# Patient Record
Sex: Male | Born: 1935 | Race: White | Hispanic: No | Marital: Married | Smoking: Former smoker
Health system: Southern US, Community
[De-identification: ages and names within clinical notes are randomized; demographics above are authoritative.]

## PROBLEM LIST (undated history)

## (undated) DIAGNOSIS — R55 Syncope and collapse: Secondary | ICD-10-CM

## (undated) DIAGNOSIS — H9192 Unspecified hearing loss, left ear: Secondary | ICD-10-CM

## (undated) DIAGNOSIS — E119 Type 2 diabetes mellitus without complications: Secondary | ICD-10-CM

## (undated) DIAGNOSIS — I10 Essential (primary) hypertension: Secondary | ICD-10-CM

## (undated) DIAGNOSIS — I447 Left bundle-branch block, unspecified: Secondary | ICD-10-CM

## (undated) DIAGNOSIS — R2230 Localized swelling, mass and lump, unspecified upper limb: Secondary | ICD-10-CM

## (undated) DIAGNOSIS — F4310 Post-traumatic stress disorder, unspecified: Secondary | ICD-10-CM

## (undated) DIAGNOSIS — H9312 Tinnitus, left ear: Secondary | ICD-10-CM

## (undated) DIAGNOSIS — F419 Anxiety disorder, unspecified: Secondary | ICD-10-CM

## (undated) DIAGNOSIS — I255 Ischemic cardiomyopathy: Secondary | ICD-10-CM

## (undated) DIAGNOSIS — C3491 Malignant neoplasm of unspecified part of right bronchus or lung: Principal | ICD-10-CM

## (undated) DIAGNOSIS — L0292 Furuncle, unspecified: Secondary | ICD-10-CM

## (undated) DIAGNOSIS — F515 Nightmare disorder: Secondary | ICD-10-CM

## (undated) DIAGNOSIS — S92909A Unspecified fracture of unspecified foot, initial encounter for closed fracture: Secondary | ICD-10-CM

## (undated) DIAGNOSIS — I2510 Atherosclerotic heart disease of native coronary artery without angina pectoris: Secondary | ICD-10-CM

## (undated) DIAGNOSIS — I219 Acute myocardial infarction, unspecified: Secondary | ICD-10-CM

## (undated) DIAGNOSIS — M1990 Unspecified osteoarthritis, unspecified site: Secondary | ICD-10-CM

## (undated) DIAGNOSIS — R0602 Shortness of breath: Secondary | ICD-10-CM

## (undated) DIAGNOSIS — N189 Chronic kidney disease, unspecified: Secondary | ICD-10-CM

## (undated) DIAGNOSIS — J449 Chronic obstructive pulmonary disease, unspecified: Secondary | ICD-10-CM

## (undated) DIAGNOSIS — E785 Hyperlipidemia, unspecified: Secondary | ICD-10-CM

## (undated) HISTORY — DX: Atherosclerotic heart disease of native coronary artery without angina pectoris: I25.10

## (undated) HISTORY — DX: Nightmare disorder: F51.5

## (undated) HISTORY — DX: Chronic obstructive pulmonary disease, unspecified: J44.9

## (undated) HISTORY — DX: Ischemic cardiomyopathy: I25.5

## (undated) HISTORY — DX: Unspecified fracture of unspecified foot, initial encounter for closed fracture: S92.909A

## (undated) HISTORY — DX: Type 2 diabetes mellitus without complications: E11.9

## (undated) HISTORY — PX: FRACTURE SURGERY: SHX138

## (undated) HISTORY — DX: Malignant neoplasm of unspecified part of right bronchus or lung: C34.91

## (undated) HISTORY — DX: Essential (primary) hypertension: I10

## (undated) HISTORY — DX: Hyperlipidemia, unspecified: E78.5

---
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
## 1991-04-08 DIAGNOSIS — E785 Hyperlipidemia, unspecified: Secondary | ICD-10-CM

## 1991-04-08 DIAGNOSIS — J449 Chronic obstructive pulmonary disease, unspecified: Secondary | ICD-10-CM

## 1991-04-08 DIAGNOSIS — I10 Essential (primary) hypertension: Secondary | ICD-10-CM

## 1991-04-08 HISTORY — DX: Essential (primary) hypertension: I10

## 1991-04-08 HISTORY — DX: Hyperlipidemia, unspecified: E78.5

## 1991-04-08 HISTORY — DX: Chronic obstructive pulmonary disease, unspecified: J44.9

## 1998-04-07 DIAGNOSIS — E119 Type 2 diabetes mellitus without complications: Secondary | ICD-10-CM

## 1998-04-07 HISTORY — DX: Type 2 diabetes mellitus without complications: E11.9

## 2000-08-05 ENCOUNTER — Encounter: Payer: Self-pay | Admitting: Family Medicine

## 2000-11-05 ENCOUNTER — Encounter: Payer: Self-pay | Admitting: Family Medicine

## 2000-11-05 LAB — CONVERTED CEMR LAB: Hgb A1c MFr Bld: 7 %

## 2001-02-05 ENCOUNTER — Encounter: Payer: Self-pay | Admitting: Family Medicine

## 2001-06-05 ENCOUNTER — Encounter: Payer: Self-pay | Admitting: Family Medicine

## 2001-06-05 LAB — CONVERTED CEMR LAB: Hgb A1c MFr Bld: 8.3 %

## 2002-01-05 ENCOUNTER — Encounter: Payer: Self-pay | Admitting: Family Medicine

## 2002-01-05 LAB — CONVERTED CEMR LAB: Hgb A1c MFr Bld: 10.1 %

## 2002-04-07 ENCOUNTER — Encounter: Payer: Self-pay | Admitting: Family Medicine

## 2002-04-07 LAB — CONVERTED CEMR LAB: Hgb A1c MFr Bld: 7 %

## 2002-10-06 ENCOUNTER — Encounter: Payer: Self-pay | Admitting: Family Medicine

## 2002-10-06 LAB — CONVERTED CEMR LAB: Hgb A1c MFr Bld: 13.2 %

## 2003-01-06 ENCOUNTER — Encounter: Payer: Self-pay | Admitting: Family Medicine

## 2003-10-06 ENCOUNTER — Encounter: Payer: Self-pay | Admitting: Family Medicine

## 2004-06-05 ENCOUNTER — Encounter: Payer: Self-pay | Admitting: Family Medicine

## 2004-06-05 LAB — CONVERTED CEMR LAB: PSA: 0.76 ng/mL

## 2004-06-06 ENCOUNTER — Ambulatory Visit: Payer: Self-pay | Admitting: Family Medicine

## 2004-06-10 ENCOUNTER — Ambulatory Visit: Payer: Self-pay | Admitting: Family Medicine

## 2004-11-05 ENCOUNTER — Encounter: Payer: Self-pay | Admitting: Family Medicine

## 2004-11-11 ENCOUNTER — Ambulatory Visit: Payer: Self-pay

## 2004-12-05 ENCOUNTER — Ambulatory Visit: Payer: Self-pay | Admitting: Family Medicine

## 2004-12-13 ENCOUNTER — Ambulatory Visit: Payer: Self-pay | Admitting: Family Medicine

## 2005-03-07 ENCOUNTER — Ambulatory Visit: Payer: Self-pay | Admitting: Family Medicine

## 2005-03-07 LAB — CONVERTED CEMR LAB: Hgb A1c MFr Bld: 7.8 %

## 2005-03-14 ENCOUNTER — Ambulatory Visit: Payer: Self-pay | Admitting: Family Medicine

## 2005-09-05 ENCOUNTER — Encounter: Payer: Self-pay | Admitting: Family Medicine

## 2005-09-24 ENCOUNTER — Ambulatory Visit: Payer: Self-pay | Admitting: Family Medicine

## 2005-09-24 LAB — CONVERTED CEMR LAB
Microalbumin U total vol: 40.8 mg/L
PSA: 0.73 ng/mL

## 2005-09-30 ENCOUNTER — Ambulatory Visit: Payer: Self-pay | Admitting: Family Medicine

## 2006-01-05 ENCOUNTER — Encounter: Payer: Self-pay | Admitting: Family Medicine

## 2006-02-04 ENCOUNTER — Ambulatory Visit: Payer: Self-pay | Admitting: Family Medicine

## 2006-02-06 ENCOUNTER — Ambulatory Visit: Payer: Self-pay | Admitting: Family Medicine

## 2006-04-07 HISTORY — PX: CORONARY ARTERY BYPASS GRAFT: SHX141

## 2006-05-14 ENCOUNTER — Inpatient Hospital Stay (HOSPITAL_COMMUNITY): Admission: EM | Admit: 2006-05-14 | Discharge: 2006-05-24 | Payer: Self-pay | Admitting: Emergency Medicine

## 2006-05-14 ENCOUNTER — Encounter: Payer: Self-pay | Admitting: Cardiology

## 2006-05-14 ENCOUNTER — Ambulatory Visit: Payer: Self-pay | Admitting: Cardiology

## 2006-05-15 ENCOUNTER — Ambulatory Visit: Payer: Self-pay | Admitting: Thoracic Surgery (Cardiothoracic Vascular Surgery)

## 2006-05-15 ENCOUNTER — Encounter: Payer: Self-pay | Admitting: Vascular Surgery

## 2006-06-02 ENCOUNTER — Ambulatory Visit: Payer: Self-pay

## 2006-06-02 LAB — CONVERTED CEMR LAB
CO2: 26 meq/L (ref 19–32)
Calcium: 9 mg/dL (ref 8.4–10.5)
Eosinophils Relative: 1.2 % (ref 0.0–5.0)
GFR calc Af Amer: 95 mL/min
GFR calc non Af Amer: 79 mL/min
Hemoglobin: 10.7 g/dL — ABNORMAL LOW (ref 13.0–17.0)
Lymphocytes Relative: 12.4 % (ref 12.0–46.0)
MCHC: 33 g/dL (ref 30.0–36.0)
MCV: 90.4 fL (ref 78.0–100.0)
Monocytes Absolute: 0.7 10*3/uL (ref 0.2–0.7)
Monocytes Relative: 8.4 % (ref 3.0–11.0)
Neutrophils Relative %: 77.8 % — ABNORMAL HIGH (ref 43.0–77.0)
Platelets: 559 10*3/uL — ABNORMAL HIGH (ref 150–400)
Potassium: 4.2 meq/L (ref 3.5–5.1)
Sodium: 136 meq/L (ref 135–145)

## 2006-06-03 ENCOUNTER — Ambulatory Visit: Payer: Self-pay | Admitting: Family Medicine

## 2006-06-04 ENCOUNTER — Encounter: Admission: RE | Admit: 2006-06-04 | Discharge: 2006-09-02 | Payer: Self-pay | Admitting: Cardiology

## 2006-06-05 ENCOUNTER — Ambulatory Visit: Payer: Self-pay | Admitting: Family Medicine

## 2006-06-12 ENCOUNTER — Ambulatory Visit: Payer: Self-pay

## 2006-06-12 ENCOUNTER — Encounter: Payer: Self-pay | Admitting: Cardiology

## 2006-06-15 ENCOUNTER — Ambulatory Visit: Payer: Self-pay | Admitting: Thoracic Surgery (Cardiothoracic Vascular Surgery)

## 2006-06-25 ENCOUNTER — Ambulatory Visit: Payer: Self-pay | Admitting: Family Medicine

## 2006-06-29 ENCOUNTER — Ambulatory Visit: Payer: Self-pay | Admitting: Cardiology

## 2006-06-29 LAB — CONVERTED CEMR LAB
Chloride: 104 meq/L (ref 96–112)
Glucose, Bld: 163 mg/dL — ABNORMAL HIGH (ref 70–99)
Potassium: 4.2 meq/L (ref 3.5–5.1)

## 2006-07-09 ENCOUNTER — Encounter (HOSPITAL_COMMUNITY): Admission: RE | Admit: 2006-07-09 | Discharge: 2006-10-07 | Payer: Self-pay | Admitting: Cardiology

## 2006-07-10 ENCOUNTER — Ambulatory Visit: Payer: Self-pay | Admitting: Cardiology

## 2006-07-10 LAB — CONVERTED CEMR LAB
AST: 24 units/L (ref 0–37)
BUN: 20 mg/dL (ref 6–23)
Bilirubin, Direct: 0.1 mg/dL (ref 0.0–0.3)
Chloride: 105 meq/L (ref 96–112)
Cholesterol: 148 mg/dL (ref 0–200)
Creatinine, Ser: 1.3 mg/dL (ref 0.4–1.5)
GFR calc Af Amer: 70 mL/min
Glucose, Bld: 175 mg/dL — ABNORMAL HIGH (ref 70–99)
Potassium: 4.4 meq/L (ref 3.5–5.1)
Pro B Natriuretic peptide (BNP): 131 pg/mL — ABNORMAL HIGH (ref 0.0–100.0)
Total Bilirubin: 0.7 mg/dL (ref 0.3–1.2)
Total CHOL/HDL Ratio: 3.8
Triglycerides: 116 mg/dL (ref 0–149)

## 2006-07-27 ENCOUNTER — Ambulatory Visit: Payer: Self-pay | Admitting: Family Medicine

## 2006-07-27 ENCOUNTER — Ambulatory Visit: Payer: Self-pay | Admitting: Thoracic Surgery (Cardiothoracic Vascular Surgery)

## 2006-07-27 LAB — CONVERTED CEMR LAB
Cholesterol: 165 mg/dL (ref 0–200)
Hgb A1c MFr Bld: 7.3 %
LDL Cholesterol: 96 mg/dL (ref 0–99)
Triglycerides: 142 mg/dL (ref 0–149)
VLDL: 28 mg/dL (ref 0–40)

## 2006-07-29 ENCOUNTER — Ambulatory Visit: Payer: Self-pay | Admitting: Family Medicine

## 2006-08-04 ENCOUNTER — Ambulatory Visit: Payer: Self-pay | Admitting: Cardiology

## 2006-08-10 ENCOUNTER — Encounter: Payer: Self-pay | Admitting: Family Medicine

## 2006-08-10 DIAGNOSIS — F17200 Nicotine dependence, unspecified, uncomplicated: Secondary | ICD-10-CM | POA: Insufficient documentation

## 2006-08-10 DIAGNOSIS — I214 Non-ST elevation (NSTEMI) myocardial infarction: Secondary | ICD-10-CM

## 2006-08-10 DIAGNOSIS — N529 Male erectile dysfunction, unspecified: Secondary | ICD-10-CM

## 2006-08-10 DIAGNOSIS — I255 Ischemic cardiomyopathy: Secondary | ICD-10-CM | POA: Insufficient documentation

## 2006-08-10 DIAGNOSIS — G4700 Insomnia, unspecified: Secondary | ICD-10-CM

## 2006-08-12 DIAGNOSIS — E785 Hyperlipidemia, unspecified: Secondary | ICD-10-CM

## 2006-08-12 DIAGNOSIS — J449 Chronic obstructive pulmonary disease, unspecified: Secondary | ICD-10-CM | POA: Insufficient documentation

## 2006-08-12 DIAGNOSIS — I10 Essential (primary) hypertension: Secondary | ICD-10-CM | POA: Insufficient documentation

## 2006-08-13 ENCOUNTER — Ambulatory Visit: Payer: Self-pay | Admitting: Family Medicine

## 2006-08-19 ENCOUNTER — Encounter: Payer: Self-pay | Admitting: Cardiovascular Disease

## 2006-08-19 ENCOUNTER — Ambulatory Visit: Payer: Self-pay

## 2006-08-19 ENCOUNTER — Ambulatory Visit: Payer: Self-pay | Admitting: Cardiology

## 2006-08-19 LAB — CONVERTED CEMR LAB
BUN: 24 mg/dL — ABNORMAL HIGH (ref 6–23)
Calcium: 9.5 mg/dL (ref 8.4–10.5)
Chloride: 108 meq/L (ref 96–112)
Sodium: 142 meq/L (ref 135–145)

## 2006-09-04 ENCOUNTER — Encounter: Payer: Self-pay | Admitting: Family Medicine

## 2006-09-14 ENCOUNTER — Ambulatory Visit: Payer: Self-pay | Admitting: Family Medicine

## 2006-09-14 DIAGNOSIS — T887XXA Unspecified adverse effect of drug or medicament, initial encounter: Secondary | ICD-10-CM

## 2006-09-21 ENCOUNTER — Ambulatory Visit: Payer: Self-pay | Admitting: Cardiology

## 2006-09-21 LAB — CONVERTED CEMR LAB
ALT: 25 U/L
AST: 24 U/L
Albumin: 3.7 g/dL
Alkaline Phosphatase: 45 U/L
BUN: 15 mg/dL
Basophils Absolute: 0 10*3/uL
Basophils Relative: 0.5 %
Bilirubin, Direct: 0.1 mg/dL
CO2: 29 meq/L
Calcium: 9.1 mg/dL
Chloride: 106 meq/L
Cholesterol: 197 mg/dL
Creatinine, Ser: 1 mg/dL
Eosinophils Absolute: 0.1 10*3/uL
Eosinophils Relative: 1.4 %
GFR calc Af Amer: 95 mL/min
GFR calc non Af Amer: 79 mL/min
Glucose, Bld: 134 mg/dL — ABNORMAL HIGH
HCT: 38 % — ABNORMAL LOW
HDL: 46.5 mg/dL
Hemoglobin: 13.1 g/dL
LDL Cholesterol: 132 mg/dL — ABNORMAL HIGH
Lymphocytes Relative: 21.2 %
MCHC: 34.4 g/dL
MCV: 86.4 fL
Monocytes Absolute: 0.6 10*3/uL
Monocytes Relative: 8.6 %
Neutro Abs: 5 10*3/uL
Neutrophils Relative %: 68.3 %
Platelets: 221 10*3/uL
Potassium: 4.3 meq/L
Pro B Natriuretic peptide (BNP): 347 pg/mL — ABNORMAL HIGH
RBC: 4.4 M/uL
RDW: 16.5 % — ABNORMAL HIGH
Sodium: 139 meq/L
Total Bilirubin: 0.6 mg/dL
Total CHOL/HDL Ratio: 4.2
Total Protein: 7 g/dL
Triglycerides: 93 mg/dL
VLDL: 19 mg/dL
WBC: 7.2 10*3/uL

## 2006-10-02 ENCOUNTER — Ambulatory Visit: Payer: Self-pay | Admitting: Family Medicine

## 2006-10-04 LAB — CONVERTED CEMR LAB: TSH: 3.81 microintl units/mL (ref 0.35–5.50)

## 2006-10-05 ENCOUNTER — Ambulatory Visit: Payer: Self-pay | Admitting: Family Medicine

## 2006-10-08 ENCOUNTER — Encounter (HOSPITAL_COMMUNITY): Admission: RE | Admit: 2006-10-08 | Discharge: 2006-11-05 | Payer: Self-pay | Admitting: Cardiology

## 2006-10-22 ENCOUNTER — Telehealth: Payer: Self-pay | Admitting: Family Medicine

## 2006-10-22 ENCOUNTER — Encounter: Payer: Self-pay | Admitting: Family Medicine

## 2006-11-09 ENCOUNTER — Ambulatory Visit: Payer: Self-pay | Admitting: Family Medicine

## 2006-11-09 DIAGNOSIS — H9319 Tinnitus, unspecified ear: Secondary | ICD-10-CM | POA: Insufficient documentation

## 2006-11-17 ENCOUNTER — Telehealth (INDEPENDENT_AMBULATORY_CARE_PROVIDER_SITE_OTHER): Payer: Self-pay | Admitting: *Deleted

## 2007-01-11 ENCOUNTER — Encounter (INDEPENDENT_AMBULATORY_CARE_PROVIDER_SITE_OTHER): Payer: Self-pay | Admitting: *Deleted

## 2007-01-12 ENCOUNTER — Encounter (INDEPENDENT_AMBULATORY_CARE_PROVIDER_SITE_OTHER): Payer: Self-pay | Admitting: *Deleted

## 2007-01-21 ENCOUNTER — Ambulatory Visit: Payer: Self-pay | Admitting: Family Medicine

## 2007-01-21 LAB — CONVERTED CEMR LAB: Hgb A1c MFr Bld: 7.4 % — ABNORMAL HIGH (ref 4.6–6.0)

## 2007-01-27 ENCOUNTER — Ambulatory Visit: Payer: Self-pay | Admitting: Cardiology

## 2007-01-27 ENCOUNTER — Ambulatory Visit: Payer: Self-pay | Admitting: Family Medicine

## 2007-02-08 ENCOUNTER — Telehealth: Payer: Self-pay | Admitting: Family Medicine

## 2007-03-01 ENCOUNTER — Encounter: Payer: Self-pay | Admitting: Family Medicine

## 2007-03-15 ENCOUNTER — Telehealth: Payer: Self-pay | Admitting: Family Medicine

## 2007-04-16 ENCOUNTER — Encounter (INDEPENDENT_AMBULATORY_CARE_PROVIDER_SITE_OTHER): Payer: Self-pay | Admitting: *Deleted

## 2007-04-19 ENCOUNTER — Ambulatory Visit: Payer: Self-pay | Admitting: Family Medicine

## 2007-04-19 LAB — CONVERTED CEMR LAB: Hgb A1c MFr Bld: 8.3 % — ABNORMAL HIGH (ref 4.6–6.0)

## 2007-04-20 ENCOUNTER — Ambulatory Visit: Payer: Self-pay | Admitting: Family Medicine

## 2007-04-23 ENCOUNTER — Encounter: Payer: Self-pay | Admitting: Family Medicine

## 2007-04-26 ENCOUNTER — Encounter (INDEPENDENT_AMBULATORY_CARE_PROVIDER_SITE_OTHER): Payer: Self-pay | Admitting: *Deleted

## 2007-06-21 ENCOUNTER — Ambulatory Visit: Payer: Self-pay | Admitting: Family Medicine

## 2007-06-21 LAB — CONVERTED CEMR LAB: Hgb A1c MFr Bld: 8.2 % — ABNORMAL HIGH (ref 4.6–6.0)

## 2007-06-25 ENCOUNTER — Ambulatory Visit: Payer: Self-pay | Admitting: Family Medicine

## 2007-07-07 ENCOUNTER — Encounter: Payer: Self-pay | Admitting: Cardiology

## 2007-07-07 ENCOUNTER — Ambulatory Visit: Payer: Self-pay

## 2007-07-15 ENCOUNTER — Ambulatory Visit: Payer: Self-pay | Admitting: Cardiology

## 2007-07-26 ENCOUNTER — Telehealth: Payer: Self-pay | Admitting: Family Medicine

## 2007-07-29 ENCOUNTER — Ambulatory Visit: Payer: Self-pay | Admitting: Cardiology

## 2007-07-29 LAB — CONVERTED CEMR LAB
BUN: 23 mg/dL (ref 6–23)
Chloride: 103 meq/L (ref 96–112)
Creatinine, Ser: 1.3 mg/dL (ref 0.4–1.5)
Glucose, Bld: 215 mg/dL — ABNORMAL HIGH (ref 70–99)
Potassium: 4.3 meq/L (ref 3.5–5.1)
Sodium: 138 meq/L (ref 135–145)

## 2007-07-30 ENCOUNTER — Telehealth: Payer: Self-pay | Admitting: Family Medicine

## 2007-09-01 ENCOUNTER — Telehealth: Payer: Self-pay | Admitting: Family Medicine

## 2007-09-23 ENCOUNTER — Ambulatory Visit: Payer: Self-pay | Admitting: Family Medicine

## 2007-09-23 LAB — CONVERTED CEMR LAB: Hgb A1c MFr Bld: 9.1 % — ABNORMAL HIGH (ref 4.6–6.0)

## 2007-09-27 ENCOUNTER — Ambulatory Visit: Payer: Self-pay | Admitting: Family Medicine

## 2007-09-28 ENCOUNTER — Telehealth: Payer: Self-pay | Admitting: Family Medicine

## 2007-10-11 ENCOUNTER — Telehealth: Payer: Self-pay | Admitting: Family Medicine

## 2007-10-14 ENCOUNTER — Telehealth: Payer: Self-pay | Admitting: Family Medicine

## 2007-11-03 ENCOUNTER — Ambulatory Visit: Payer: Self-pay | Admitting: Family Medicine

## 2007-11-03 ENCOUNTER — Ambulatory Visit: Payer: Self-pay | Admitting: Cardiology

## 2007-11-03 LAB — CONVERTED CEMR LAB
Alkaline Phosphatase: 45 units/L (ref 39–117)
BUN: 16 mg/dL (ref 6–23)
Basophils Absolute: 0 10*3/uL (ref 0.0–0.1)
CO2: 28 meq/L (ref 19–32)
Creatinine, Ser: 1.1 mg/dL (ref 0.4–1.5)
Eosinophils Absolute: 0.1 10*3/uL (ref 0.0–0.7)
GFR calc Af Amer: 85 mL/min
GFR calc non Af Amer: 70 mL/min
HDL: 45.7 mg/dL (ref >39.0)
Hemoglobin: 13.9 g/dL (ref 13.0–17.0)
Lymphocytes Relative: 13.9 % (ref 12.0–46.0)
Monocytes Relative: 6.6 % (ref 3.0–12.0)
RBC: 4.25 M/uL (ref 4.22–5.81)
RDW: 12.9 % (ref 11.5–14.6)
Total Bilirubin: 0.7 mg/dL (ref 0.3–1.2)
Total CHOL/HDL Ratio: 4.6
Total Protein: 7 g/dL (ref 6.0–8.3)
VLDL: 35 mg/dL (ref 0–40)

## 2007-11-05 ENCOUNTER — Telehealth: Payer: Self-pay | Admitting: Family Medicine

## 2007-11-15 ENCOUNTER — Telehealth: Payer: Self-pay | Admitting: Internal Medicine

## 2007-12-08 ENCOUNTER — Ambulatory Visit: Payer: Self-pay | Admitting: Family Medicine

## 2007-12-20 ENCOUNTER — Encounter: Payer: Self-pay | Admitting: Family Medicine

## 2007-12-28 ENCOUNTER — Encounter: Payer: Self-pay | Admitting: Family Medicine

## 2008-01-10 ENCOUNTER — Telehealth: Payer: Self-pay | Admitting: Family Medicine

## 2008-01-27 ENCOUNTER — Ambulatory Visit: Payer: Self-pay

## 2008-01-27 ENCOUNTER — Encounter: Payer: Self-pay | Admitting: Cardiology

## 2008-01-31 ENCOUNTER — Ambulatory Visit: Payer: Self-pay | Admitting: Cardiology

## 2008-02-07 ENCOUNTER — Ambulatory Visit: Payer: Self-pay | Admitting: Family Medicine

## 2008-02-07 ENCOUNTER — Telehealth: Payer: Self-pay | Admitting: Family Medicine

## 2008-02-08 ENCOUNTER — Ambulatory Visit: Payer: Self-pay | Admitting: Internal Medicine

## 2008-02-15 ENCOUNTER — Telehealth (INDEPENDENT_AMBULATORY_CARE_PROVIDER_SITE_OTHER): Payer: Self-pay | Admitting: *Deleted

## 2008-02-21 ENCOUNTER — Ambulatory Visit: Payer: Self-pay | Admitting: Family Medicine

## 2008-02-21 LAB — CONVERTED CEMR LAB
ALT: 23 units/L (ref 0–53)
AST: 22 units/L (ref 0–37)
Bilirubin, Direct: 0.1 mg/dL (ref 0.0–0.3)
CO2: 25 meq/L (ref 19–32)
Chloride: 108 meq/L (ref 96–112)
Cholesterol: 258 mg/dL (ref 0–200)
Direct LDL: 165.2 mg/dL
PSA: 0.96 ng/mL (ref 0.10–4.00)
Sodium: 141 meq/L (ref 135–145)
Total Bilirubin: 0.6 mg/dL (ref 0.3–1.2)
Total CHOL/HDL Ratio: 6.2

## 2008-02-24 ENCOUNTER — Ambulatory Visit: Payer: Self-pay | Admitting: Family Medicine

## 2008-03-21 ENCOUNTER — Encounter: Payer: Self-pay | Admitting: Family Medicine

## 2008-03-28 ENCOUNTER — Ambulatory Visit: Payer: Self-pay | Admitting: Internal Medicine

## 2008-03-29 LAB — CONVERTED CEMR LAB
GFR calc Af Amer: 85 mL/min
Glucose, Bld: 182 mg/dL — ABNORMAL HIGH (ref 70–99)
HCT: 40.8 % (ref 39.0–52.0)
INR: 0.9 (ref 0.8–1.0)
Lymphocytes Relative: 20.5 % (ref 12.0–46.0)
Monocytes Absolute: 0.5 10*3/uL (ref 0.1–1.0)
Monocytes Relative: 8.8 % (ref 3.0–12.0)
Platelets: 188 10*3/uL (ref 150–400)
Potassium: 4.5 meq/L (ref 3.5–5.1)
Prothrombin Time: 10.2 s — ABNORMAL LOW (ref 10.9–13.3)
RDW: 12.1 % (ref 11.5–14.6)
Sodium: 137 meq/L (ref 135–145)
aPTT: 25.2 s (ref 21.7–29.8)

## 2008-04-04 ENCOUNTER — Inpatient Hospital Stay (HOSPITAL_COMMUNITY): Admission: AD | Admit: 2008-04-04 | Discharge: 2008-04-05 | Payer: Self-pay | Admitting: Internal Medicine

## 2008-04-04 ENCOUNTER — Ambulatory Visit: Payer: Self-pay | Admitting: Internal Medicine

## 2008-04-05 ENCOUNTER — Encounter: Payer: Self-pay | Admitting: Family Medicine

## 2008-04-24 ENCOUNTER — Ambulatory Visit: Payer: Self-pay

## 2008-04-24 ENCOUNTER — Encounter (INDEPENDENT_AMBULATORY_CARE_PROVIDER_SITE_OTHER): Payer: Self-pay | Admitting: *Deleted

## 2008-05-31 ENCOUNTER — Encounter: Payer: Self-pay | Admitting: Family Medicine

## 2008-06-04 DIAGNOSIS — I2510 Atherosclerotic heart disease of native coronary artery without angina pectoris: Secondary | ICD-10-CM

## 2008-06-05 ENCOUNTER — Ambulatory Visit: Payer: Self-pay | Admitting: Cardiology

## 2008-06-05 ENCOUNTER — Encounter: Payer: Self-pay | Admitting: Cardiology

## 2008-06-16 ENCOUNTER — Ambulatory Visit: Payer: Self-pay | Admitting: Cardiology

## 2008-06-22 ENCOUNTER — Ambulatory Visit: Payer: Self-pay | Admitting: Family Medicine

## 2008-06-22 DIAGNOSIS — K219 Gastro-esophageal reflux disease without esophagitis: Secondary | ICD-10-CM

## 2008-08-07 ENCOUNTER — Ambulatory Visit: Payer: Self-pay | Admitting: Internal Medicine

## 2008-08-10 ENCOUNTER — Telehealth: Payer: Self-pay | Admitting: Family Medicine

## 2008-08-10 ENCOUNTER — Encounter: Payer: Self-pay | Admitting: Internal Medicine

## 2008-08-10 ENCOUNTER — Telehealth (INDEPENDENT_AMBULATORY_CARE_PROVIDER_SITE_OTHER): Payer: Self-pay | Admitting: Internal Medicine

## 2008-08-16 ENCOUNTER — Ambulatory Visit: Payer: Self-pay | Admitting: Family Medicine

## 2008-08-16 LAB — CONVERTED CEMR LAB
Albumin: 3.7 g/dL (ref 3.5–5.2)
Basophils Relative: 0.6 % (ref 0.0–3.0)
CO2: 30 meq/L (ref 19–32)
Calcium: 9.1 mg/dL (ref 8.4–10.5)
Chloride: 110 meq/L (ref 96–112)
Creatinine,U: 232.5 mg/dL
Eosinophils Relative: 2.9 % (ref 0.0–5.0)
Hgb A1c MFr Bld: 7.9 % — ABNORMAL HIGH (ref 4.6–6.5)
Lymphocytes Relative: 23.8 % (ref 12.0–46.0)
MCV: 92 fL (ref 78.0–100.0)
Microalb Creat Ratio: 65.4 mg/g — ABNORMAL HIGH (ref 0.0–30.0)
Monocytes Absolute: 0.4 10*3/uL (ref 0.1–1.0)
Monocytes Relative: 7.6 % (ref 3.0–12.0)
Neutrophils Relative %: 65.1 % (ref 43.0–77.0)
Potassium: 4.2 meq/L (ref 3.5–5.1)
RBC: 4 M/uL — ABNORMAL LOW (ref 4.22–5.81)
WBC: 5.9 10*3/uL (ref 4.5–10.5)

## 2008-08-21 ENCOUNTER — Ambulatory Visit: Payer: Self-pay | Admitting: Family Medicine

## 2008-08-24 ENCOUNTER — Emergency Department: Payer: Self-pay | Admitting: Emergency Medicine

## 2008-08-25 ENCOUNTER — Telehealth: Payer: Self-pay | Admitting: Cardiology

## 2008-08-29 ENCOUNTER — Telehealth (INDEPENDENT_AMBULATORY_CARE_PROVIDER_SITE_OTHER): Payer: Self-pay | Admitting: *Deleted

## 2008-09-01 ENCOUNTER — Telehealth: Payer: Self-pay | Admitting: Cardiology

## 2008-09-07 ENCOUNTER — Ambulatory Visit: Payer: Self-pay | Admitting: Family Medicine

## 2008-09-18 ENCOUNTER — Encounter: Payer: Self-pay | Admitting: Cardiology

## 2008-09-18 ENCOUNTER — Encounter: Payer: Self-pay | Admitting: Family Medicine

## 2008-10-11 ENCOUNTER — Encounter: Payer: Self-pay | Admitting: Family Medicine

## 2008-10-24 ENCOUNTER — Telehealth: Payer: Self-pay | Admitting: Cardiology

## 2008-11-06 ENCOUNTER — Ambulatory Visit: Payer: Self-pay | Admitting: Internal Medicine

## 2008-11-06 ENCOUNTER — Encounter: Payer: Self-pay | Admitting: Internal Medicine

## 2008-11-22 ENCOUNTER — Telehealth: Payer: Self-pay | Admitting: Family Medicine

## 2009-02-06 ENCOUNTER — Telehealth: Payer: Self-pay | Admitting: Family Medicine

## 2009-02-06 ENCOUNTER — Encounter (INDEPENDENT_AMBULATORY_CARE_PROVIDER_SITE_OTHER): Payer: Self-pay | Admitting: *Deleted

## 2009-02-27 ENCOUNTER — Encounter (INDEPENDENT_AMBULATORY_CARE_PROVIDER_SITE_OTHER): Payer: Self-pay | Admitting: *Deleted

## 2009-03-06 ENCOUNTER — Ambulatory Visit: Payer: Self-pay | Admitting: Family Medicine

## 2009-03-06 LAB — CONVERTED CEMR LAB
ALT: 28 units/L (ref 0–53)
AST: 28 units/L (ref 0–37)
Basophils Relative: 0.5 % (ref 0.0–3.0)
Bilirubin, Direct: 0.1 mg/dL (ref 0.0–0.3)
Chloride: 103 meq/L (ref 96–112)
Eosinophils Relative: 2.2 % (ref 0.0–5.0)
GFR calc non Af Amer: 48.86 mL/min (ref >60)
HCT: 41.8 % (ref 39.0–52.0)
Hgb A1c MFr Bld: 7.3 % — ABNORMAL HIGH (ref 4.6–6.5)
Lymphs Abs: 1.5 10*3/uL (ref 0.7–4.0)
MCV: 97.1 fL (ref 78.0–100.0)
Microalb Creat Ratio: 85.2 mg/g — ABNORMAL HIGH (ref 0.0–30.0)
Monocytes Absolute: 0.6 10*3/uL (ref 0.1–1.0)
Monocytes Relative: 9.5 % (ref 3.0–12.0)
PSA: 0.93 ng/mL (ref 0.10–4.00)
Potassium: 3.6 meq/L (ref 3.5–5.1)
RBC: 4.3 M/uL (ref 4.22–5.81)
Sodium: 140 meq/L (ref 135–145)
Total Bilirubin: 0.9 mg/dL (ref 0.3–1.2)
Total Protein: 6.2 g/dL (ref 6.0–8.3)
WBC: 6.2 10*3/uL (ref 4.5–10.5)

## 2009-03-12 ENCOUNTER — Ambulatory Visit: Payer: Self-pay | Admitting: Family Medicine

## 2009-03-13 ENCOUNTER — Telehealth: Payer: Self-pay | Admitting: Family Medicine

## 2009-05-07 ENCOUNTER — Ambulatory Visit: Payer: Self-pay | Admitting: Internal Medicine

## 2009-05-09 ENCOUNTER — Encounter: Payer: Self-pay | Admitting: Cardiology

## 2009-05-11 ENCOUNTER — Telehealth: Payer: Self-pay | Admitting: Family Medicine

## 2009-05-13 ENCOUNTER — Ambulatory Visit: Payer: Self-pay | Admitting: Internal Medicine

## 2009-05-13 ENCOUNTER — Inpatient Hospital Stay (HOSPITAL_COMMUNITY): Admission: EM | Admit: 2009-05-13 | Discharge: 2009-05-16 | Payer: Self-pay | Admitting: Emergency Medicine

## 2009-05-13 ENCOUNTER — Encounter: Payer: Self-pay | Admitting: Cardiology

## 2009-05-16 ENCOUNTER — Encounter: Payer: Self-pay | Admitting: Cardiology

## 2009-05-17 ENCOUNTER — Telehealth: Payer: Self-pay | Admitting: Cardiology

## 2009-05-18 ENCOUNTER — Telehealth: Payer: Self-pay | Admitting: Cardiology

## 2009-05-18 ENCOUNTER — Encounter: Payer: Self-pay | Admitting: Cardiology

## 2009-05-23 ENCOUNTER — Encounter: Payer: Self-pay | Admitting: Cardiology

## 2009-05-30 ENCOUNTER — Encounter: Payer: Self-pay | Admitting: Cardiology

## 2009-05-31 ENCOUNTER — Encounter: Payer: Self-pay | Admitting: Cardiology

## 2009-06-01 ENCOUNTER — Ambulatory Visit: Payer: Self-pay | Admitting: Cardiology

## 2009-06-05 ENCOUNTER — Encounter: Payer: Self-pay | Admitting: Cardiology

## 2009-06-05 LAB — CONVERTED CEMR LAB
BUN: 26 mg/dL
CO2: 22 meq/L
Calcium: 8.9 mg/dL
Chloride: 104 meq/L
Creatinine, Ser: 1.4 mg/dL
Glucose, Bld: 167 mg/dL

## 2009-06-08 ENCOUNTER — Encounter: Payer: Self-pay | Admitting: Cardiology

## 2009-06-15 ENCOUNTER — Telehealth: Payer: Self-pay | Admitting: Family Medicine

## 2009-06-21 ENCOUNTER — Encounter: Payer: Self-pay | Admitting: Cardiology

## 2009-06-25 ENCOUNTER — Encounter: Payer: Self-pay | Admitting: Cardiology

## 2009-06-29 ENCOUNTER — Encounter: Payer: Self-pay | Admitting: Cardiology

## 2009-07-16 ENCOUNTER — Telehealth: Payer: Self-pay | Admitting: Cardiology

## 2009-07-18 ENCOUNTER — Telehealth: Payer: Self-pay | Admitting: Family Medicine

## 2009-07-22 ENCOUNTER — Encounter: Payer: Self-pay | Admitting: Family Medicine

## 2009-07-25 ENCOUNTER — Telehealth: Payer: Self-pay | Admitting: Family Medicine

## 2009-07-25 ENCOUNTER — Encounter: Payer: Self-pay | Admitting: Family Medicine

## 2009-07-25 DIAGNOSIS — IMO0002 Reserved for concepts with insufficient information to code with codable children: Secondary | ICD-10-CM | POA: Insufficient documentation

## 2009-07-25 DIAGNOSIS — E1129 Type 2 diabetes mellitus with other diabetic kidney complication: Secondary | ICD-10-CM

## 2009-07-25 DIAGNOSIS — E1165 Type 2 diabetes mellitus with hyperglycemia: Secondary | ICD-10-CM

## 2009-08-03 ENCOUNTER — Ambulatory Visit: Payer: Self-pay | Admitting: Cardiology

## 2009-08-06 ENCOUNTER — Encounter: Payer: Self-pay | Admitting: Cardiology

## 2009-08-06 ENCOUNTER — Ambulatory Visit: Payer: Self-pay | Admitting: Family Medicine

## 2009-08-06 ENCOUNTER — Ambulatory Visit: Payer: Self-pay | Admitting: Internal Medicine

## 2009-08-08 ENCOUNTER — Telehealth: Payer: Self-pay | Admitting: Family Medicine

## 2009-08-13 ENCOUNTER — Ambulatory Visit: Payer: Self-pay | Admitting: Family Medicine

## 2009-08-13 ENCOUNTER — Telehealth: Payer: Self-pay | Admitting: Family Medicine

## 2009-08-13 DIAGNOSIS — R42 Dizziness and giddiness: Secondary | ICD-10-CM

## 2009-08-23 ENCOUNTER — Encounter: Payer: Self-pay | Admitting: Family Medicine

## 2009-09-24 ENCOUNTER — Encounter: Payer: Self-pay | Admitting: Family Medicine

## 2009-10-24 ENCOUNTER — Telehealth: Payer: Self-pay | Admitting: Family Medicine

## 2009-10-30 ENCOUNTER — Ambulatory Visit: Payer: Self-pay | Admitting: Cardiology

## 2009-11-07 ENCOUNTER — Encounter (INDEPENDENT_AMBULATORY_CARE_PROVIDER_SITE_OTHER): Payer: Self-pay | Admitting: *Deleted

## 2009-11-08 ENCOUNTER — Ambulatory Visit: Payer: Self-pay | Admitting: Family Medicine

## 2009-11-13 ENCOUNTER — Ambulatory Visit: Payer: Self-pay | Admitting: Family Medicine

## 2009-12-04 ENCOUNTER — Telehealth: Payer: Self-pay | Admitting: Family Medicine

## 2010-01-21 ENCOUNTER — Telehealth: Payer: Self-pay | Admitting: Family Medicine

## 2010-02-07 ENCOUNTER — Telehealth: Payer: Self-pay | Admitting: Cardiology

## 2010-02-07 ENCOUNTER — Ambulatory Visit: Payer: Self-pay | Admitting: Cardiology

## 2010-02-13 ENCOUNTER — Ambulatory Visit: Payer: Self-pay | Admitting: Family Medicine

## 2010-02-13 ENCOUNTER — Telehealth: Payer: Self-pay | Admitting: Family Medicine

## 2010-02-13 LAB — CONVERTED CEMR LAB
Calcium: 9.6 mg/dL (ref 8.4–10.5)
Chloride: 104 meq/L (ref 96–112)
Creatinine, Ser: 1.4 mg/dL (ref 0.4–1.5)
GFR calc non Af Amer: 51.08 mL/min (ref >60)
Hgb A1c MFr Bld: 7.8 % — ABNORMAL HIGH (ref 4.6–6.5)

## 2010-02-18 ENCOUNTER — Ambulatory Visit: Payer: Self-pay | Admitting: Family Medicine

## 2010-02-19 ENCOUNTER — Encounter: Payer: Self-pay | Admitting: Cardiology

## 2010-02-19 ENCOUNTER — Ambulatory Visit: Payer: Self-pay

## 2010-02-20 ENCOUNTER — Encounter (INDEPENDENT_AMBULATORY_CARE_PROVIDER_SITE_OTHER): Payer: Self-pay | Admitting: *Deleted

## 2010-03-05 ENCOUNTER — Encounter: Payer: Self-pay | Admitting: Cardiology

## 2010-03-12 ENCOUNTER — Encounter: Payer: Self-pay | Admitting: Cardiology

## 2010-03-18 ENCOUNTER — Ambulatory Visit: Payer: Self-pay | Admitting: Internal Medicine

## 2010-03-18 DIAGNOSIS — Z95810 Presence of automatic (implantable) cardiac defibrillator: Secondary | ICD-10-CM

## 2010-04-25 ENCOUNTER — Ambulatory Visit
Admission: RE | Admit: 2010-04-25 | Discharge: 2010-04-25 | Payer: Self-pay | Source: Home / Self Care | Attending: Family Medicine | Admitting: Family Medicine

## 2010-05-01 ENCOUNTER — Telehealth: Payer: Self-pay | Admitting: Family Medicine

## 2010-05-03 ENCOUNTER — Encounter: Payer: Self-pay | Admitting: Internal Medicine

## 2010-05-03 ENCOUNTER — Telehealth: Payer: Self-pay | Admitting: Family Medicine

## 2010-05-05 LAB — CONVERTED CEMR LAB
BUN: 19 mg/dL (ref 6–23)
BUN: 21 mg/dL (ref 6–23)
BUN: 29 mg/dL — ABNORMAL HIGH (ref 6–23)
Basophils Absolute: 0 10*3/uL (ref 0.0–0.1)
CO2: 26 meq/L (ref 19–32)
Calcium: 9.4 mg/dL (ref 8.4–10.5)
Chloride: 100 meq/L (ref 96–112)
Chloride: 103 meq/L (ref 96–112)
Creatinine, Ser: 1.4 mg/dL (ref 0.4–1.5)
Creatinine, Ser: 1.6 mg/dL — ABNORMAL HIGH (ref 0.4–1.5)
Eosinophils Relative: 2 % (ref 0–5)
GFR calc non Af Amer: 54.61 mL/min (ref >60)
Glucose, Bld: 140 mg/dL — ABNORMAL HIGH (ref 70–99)
Glucose, Bld: 183 mg/dL — ABNORMAL HIGH (ref 70–99)
HCT: 40.6 % (ref 39.0–52.0)
HCT: 41 % (ref 39.0–52.0)
Hemoglobin: 14 g/dL (ref 13.0–17.0)
Lymphocytes Relative: 20 % (ref 12–46)
Lymphs Abs: 1.4 10*3/uL (ref 0.7–4.0)
Lymphs Abs: 1.6 10*3/uL (ref 0.7–4.0)
MCV: 94.8 fL (ref 78.0–100.0)
Monocytes Absolute: 0.6 10*3/uL (ref 0.1–1.0)
Monocytes Absolute: 0.7 10*3/uL (ref 0.1–1.0)
Monocytes Relative: 10 % (ref 3–12)
Neutro Abs: 4.7 10*3/uL (ref 1.7–7.7)
Neutrophils Relative %: 68 % (ref 43.0–77.0)
Platelets: 183 10*3/uL (ref 150.0–400.0)
Potassium: 4.1 meq/L (ref 3.5–5.3)
Potassium: 4.5 meq/L (ref 3.5–5.1)
Pro B Natriuretic peptide (BNP): 267 pg/mL — ABNORMAL HIGH (ref 0.0–100.0)
Pro B Natriuretic peptide (BNP): 284 pg/mL — ABNORMAL HIGH (ref 0.0–100.0)
RBC: 4.38 M/uL (ref 4.22–5.81)
RDW: 12.4 % (ref 11.5–15.5)
RDW: 13.3 % (ref 11.5–14.6)
Sodium: 133 meq/L — ABNORMAL LOW (ref 135–145)

## 2010-05-06 ENCOUNTER — Telehealth: Payer: Self-pay | Admitting: Family Medicine

## 2010-05-06 ENCOUNTER — Telehealth (INDEPENDENT_AMBULATORY_CARE_PROVIDER_SITE_OTHER): Payer: Self-pay | Admitting: *Deleted

## 2010-05-07 NOTE — Letter (Signed)
Summary: [facility]  [facility] Discharge   Imported By: Beau Fanny 08/24/2009 10:52:37  _____________________________________________________________________  External Attachment:    Type:   Image     Comment:   External Document

## 2010-05-07 NOTE — Medication Information (Signed)
Summary: Letter Regarding Polypharmacy in Seniors/[employer]  Letter Regarding Polypharmacy in Seniors/[employer]   Imported By: Lanelle Bal 11/06/2009 14:45:34  _____________________________________________________________________  External Attachment:    Type:   Image     Comment:   External Document

## 2010-05-07 NOTE — Progress Notes (Signed)
Summary: wants explenation   Phone Note Call from Patient Call back at Home Phone [phone] Call back at [phone]   Caller: [name] [name] Call For: Shaune Leeks MD Summary of Call: Wife called crying and very upset and angry because husband's sugar wasn't checked. She says that he has been out of test strips for about a month has been feeling dizzy. She says that his sugar should have been checked while he was in office and wants a explenation of why it wasn't checked. Please adivse.  Initial call taken by: Melody Comas,  Aug 13, 2009 11:46 AM  Follow-up for Phone Call        Spoke with pt's wife and tried to explain the ENTIRE situation. Follow-up by: Shaune Leeks MD,  Aug 13, 2009 2:05 PM

## 2010-05-07 NOTE — Miscellaneous (Signed)
Summary: Home Health Certification/Care Plan  Home Health Certification/Care Plan   Imported By: Roderic Ovens 06/15/2009 16:32:39  _____________________________________________________________________  External Attachment:    Type:   Image     Comment:   External Document

## 2010-05-07 NOTE — Assessment & Plan Note (Signed)
Summary: pc2   Allergies (verified): No Known Drug Allergies   PPM Specifications Following MD:  Lewayne Bunting, MD     PPM Vendor:  [employer]     PPM Model Number:  [id number]     PPM Serial Number:  [id number] PPM DOI:  04/04/2008      Lead 1    Location: RV     DOI: 04/04/2008     Model #: [id number]     Serial #: [id number]     Status: active  ICD Specifications Following MD:  Lewayne Bunting, MD     ICD Vendor:  [employer]     ICD Model Number:  [id number]     ICD Serial Number:  [id number] H ICD DOI:  04/04/2008      Lead 1:    Location: RV     DOI: 04/04/2008     Model #: [id number]     Serial #: [id number]     Status: active  ICD Follow Up Remote Check?  No Battery Voltage:  3.17 V     Charge Time:  8.3 seconds     Underlying rhythm:  SR ICD Dependent:  No       ICD Device Measurements Right Ventricle:  Amplitude: 20 mV, Impedance: 532 ohms, Threshold: 1.25 V at 0.4 msec Shock Impedance: 49/63 ohms   Episodes Shock:  0     ATP:  0     Nonsustained:  1      Brady Parameters Mode VVI     Lower Rate Limit:  40      Tachy Zones VF:  240     VT:  207     VT1:  176     Tech Comments:  No parameter changes.  Medications were not verified, the patient did not have his list today.  There were 8 VT episodes the longest 11 seconds but there were no EGM's available.  The one NSVT appears to be sinus tachy.  His heart rates are mainly in the 80-120 bpm range.  I will discuss this with Dr. Ladona Ridgel.   ROV 3 months Dr. Ladona Ridgel, [place]. Altha Harm, LPN  May 07, 2009 9:19 AM  MD Comments:  Agree with above.

## 2010-05-07 NOTE — Miscellaneous (Signed)
Summary: Flowsheet update (BMET)  Clinical Lists Changes  Observations: Added new observation of CALCIUM: 8.9 mg/dL (11/91/4782 95:62) Added new observation of CREATININE: 1.4 mg/dL (13/11/6576 46:96) Added new observation of BUN: 26 mg/dL (29/52/8413 24:40) Added new observation of BG RANDOM: 167 mg/dL (02/01/2535 64:40) Added new observation of CO2 PLSM/SER: 22 meq/L (06/05/2009 13:17) Added new observation of CL SERUM: 104 meq/L (06/05/2009 13:17) Added new observation of K SERUM: 4.7 meq/L (06/05/2009 13:17) Added new observation of NA: 138 meq/L (06/05/2009 13:17)

## 2010-05-07 NOTE — Cardiovascular Report (Signed)
Summary: Office Visit   Office Visit   Imported By: Roderic Ovens 05/09/2009 13:43:54  _____________________________________________________________________  External Attachment:    Type:   Image     Comment:   External Document

## 2010-05-07 NOTE — Assessment & Plan Note (Signed)
Summary: F3M/AMD   Primary Provider:  Shaune Leeks MD  CC:  Device Check.  History of Present Illness: [name] returns today for ICD followup.  He is a pleasant 76 yo man with an ICM, VT, and CHF.  He is s/p ICD.  He denies any intercurrent ICD shocks.  No c/p, sob or peripheral edema.  He has been bothered by his inability to lose weight. No other complaints.  Current Medications (verified): 1)  Plavix 75 Mg Tabs (Clopidogrel Bisulfate) .... Take 1 Tablet By Mouth Once A Day 2)  Lasix 40 Mg Tabs (Furosemide) .... Take 1 Tablet By Mouth Once A Day 3)  Levemir Flexpen  Soln (Insulin Detemir Soln) .... 20 Units in The Morning and 20 Units in The Evening. 4)  Crestor 20 Mg Tabs (Rosuvastatin Calcium) .... One Tablet By Mouth At Bedtime 5)  Metformin Hcl 1000 Mg Tabs (Metformin Hcl) .Marland Kitchen.. 1  Tab By Mouth Two Times A Day By Mouth 6)  Spironolactone 25 Mg  Tabs (Spironolactone) .... 1/2  Daily By Mouth 7)  Cinnamon 500 Mg  Caps (Cinnamon) .... 2 Tablets Daily By Mouth 8)  Ambien 10 Mg  Tabs (Zolpidem Tartrate) .... One Tab By Mouth Bedtime As Needed For Insomnia Sparingly. 9)  Diazepam 10 Mg  Tabs (Diazepam) .Marland Kitchen.. 1 At Bedtime As Needed For Ringing in The Ears 10)  Omega 3 Plus Vitamins .... 2 Tablets Daily By Mouth 11)  Alphasorb- C 500mg  .... 1 Daily By Mouth 12)  Joint Musle Vitamin .Marland Kitchen.. 1 Daily By Mouth 13)  Novolog Penfill 100 Unit/ml Soln (Insulin Aspart) .Marland Kitchen.. 10 Units Three Times A Day 14)  Nexium 40 Mg Cpdr (Esomeprazole Magnesium) .... One Tab By Mouth 45 Mins Before Supper 15)  Accu-Chek Aviva  Strp (Glucose Blood) .... Use 1 Strip Twice A Day 16)  Carvedilol 25 Mg Tabs (Carvedilol) .... Take One Tablet By Mouth Every Morning and A Half A Tablet Every Evening 17)  Klor-Con M20 20 Meq Cr-Tabs (Potassium Chloride Crys Cr) .... 2 By Mouth Daily 18)  Aspirin 81 Mg Tbec (Aspirin) .... Take One Tablet By Mouth Two Times A Day 19)  Lisinopril 20 Mg Tabs (Lisinopril) .... One Tablet  By Mouth Daily  Allergies (verified): No Known Drug Allergies  Past History:  Past Medical History: Last updated: 06/04/2008 COPD: 1993 Diabetes mellitus, type II: 2000 Hyperlipidemia: 1993 Hypertension:1993 CHF Coronary artery disease status post coronary bypass graft surgery     in February 2008  Ischemic cardiomyopathy with ejection fraction of 30-35%. Class II systolic heart failure. S/P Single chamber ICD 2010  Past Surgical History: Last updated: 05/17/2009 Broken bones L foot 1953 ETT nml Moryati 2000 [facility] ADMIT: 3 VESSEL CABG, CLASS IV CHF,NON Q WAVE MI: 05/19/2006 02/7-15/2008[facility] ADMIT CHF MI [facility] POSTOP ANEMIA POSTOP OVERLOAD  NEUROLOGY EVALUATION, REFUSED TESTING X 2 WEEKS HOSP Acute on Chronic CHF  Med Nonadherence  2/5-05/16/2009  Review of Systems  The patient denies chest pain, syncope, dyspnea on exertion, and peripheral edema.    Vital Signs:  Patient profile:   76 year old male Height:      68 inches Weight:      241.75 pounds BMI:     36.89 Pulse rate:   72 / minute BP sitting:   138 / 64  (left arm) Cuff size:   regular  Vitals Entered By: Stanton Kidney, EMT-P (Aug 06, 2009 3:09 PM)  Physical Exam  General:  Well-developed,well-nourished,in no acute distress;  alert,appropriate and cooperative throughout examination, mildly obese. Head:  Normocephalic and atraumatic without obvious abnormalities. No apparent alopecia or balding. Eyes:  Conjunctiva clear bilaterally.  Mouth:  Oral mucosa and oropharynx without lesions or exudates.  Teeth in good repair. Neck:  No deformities, masses, or tenderness noted. Chest Wall:  Well healed ICD incision. Lungs:  Normal respiratory effort, chest expands symmetrically. Lungs are clear to auscultation, no crackles or wheezes. Heart:  Normal rate and regular rhythm. S1 and S2 normal without gallop, murmur, click, rub or other extra sounds. Abdomen:  Bowel sounds positive,abdomen soft and non-tender without masses,  organomegaly or hernias noted. Msk:  No deformity or scoliosis noted of thoracic or lumbar spine.   Pulses:  R and L carotid,radial,femoral,dorsalis pedis and posterior tibial pulses are full and equal bilaterally Extremities:  No clubbing, cyanosis, edema, or deformity noted with normal full range of motion of all joints.   Neurologic:  Alert and oriented x 3.   PPM Specifications Following MD:  Lewayne Bunting, MD     PPM Vendor:  [employer]     PPM Model Number:  [id number]     PPM Serial Number:  [id number] H PPM DOI:  04/04/2008      Lead 1    Location: RV     DOI: 04/04/2008     Model #: [id number]     Serial #: [id number]     Status: active  ICD Specifications Following MD:  Lewayne Bunting, MD     ICD Vendor:  [employer]     ICD Model Number:  [id number]     ICD Serial Number:  [id number] H ICD DOI:  04/04/2008      Lead 1:    Location: RV     DOI: 04/04/2008     Model #: [id number]     Serial #: [id number]     Status: active  ICD Follow Up Remote Check?  No Battery Voltage:  3.19 V     Charge Time:  8.3 seconds     Underlying rhythm:  SR ICD Dependent:  No       ICD Device Measurements Right Ventricle:  Impedance: 532 ohms,  Shock Impedance: 49/61 ohms   Episodes MS Episodes:  0     Percent Mode Switch:  0     Shock:  0     ATP:  0     Nonsustained:  1     Atrial Therapies:  0 Ventricular Pacing:  <0.1%  Brady Parameters Mode VVI     Lower Rate Limit:  40      Tachy Zones VF:  >207     VT:  207 (VIA VF)     VT1:  176     Next Cardiology Appt Due:  11/05/2009 Tech Comments:  1 NST EPISODE LASTING 2 SECONDS.  NORMAL DEVICE FUNCTION W/NO CHANGES MADE.  ROV IN 3 [facility] [place].  Vella Kohler MD Comments:  Agree with above.  Impression & Recommendations:  Problem # 1:  PACEMAKER, PERMANENT (ICD-V45.01) His device is working normally. Will continue followup in several months.  Problem # 2:  SYSTOLIC HEART FAILURE, CHRONIC (ICD-428.22) His symptoms remain class 2.  I have asked him to maintain a  low sodium diet and followup with me in one year. His updated medication list for this problem includes:    Plavix 75 Mg Tabs (Clopidogrel bisulfate) .Marland Kitchen... Take 1 tablet by mouth once a day    Lasix 40 Mg Tabs (Furosemide) .Marland Kitchen... Take 1 tablet by  mouth once a day    Spironolactone 25 Mg Tabs (Spironolactone) .Marland Kitchen... 1/2  daily by mouth    Carvedilol 25 Mg Tabs (Carvedilol) .Marland Kitchen... Take one tablet by mouth every morning and a half a tablet every evening    Aspirin 81 Mg Tbec (Aspirin) .Marland Kitchen... Take one tablet by mouth two times a day    Lisinopril 20 Mg Tabs (Lisinopril) ..... One tablet by mouth daily  Problem # 3:  CAD, AUTOLOGOUS BYPASS GRAFT (ICD-414.02) He currently denies c/p or sob. His updated medication list for this problem includes:    Plavix 75 Mg Tabs (Clopidogrel bisulfate) .Marland Kitchen... Take 1 tablet by mouth once a day    Carvedilol 25 Mg Tabs (Carvedilol) .Marland Kitchen... Take one tablet by mouth every morning and a half a tablet every evening    Aspirin 81 Mg Tbec (Aspirin) .Marland Kitchen... Take one tablet by mouth two times a day    Lisinopril 20 Mg Tabs (Lisinopril) ..... One tablet by mouth daily  Patient Instructions: 1)  Your physician recommends that you schedule a follow-up appointment in: 1 year with Dr Ladona Ridgel.

## 2010-05-07 NOTE — Progress Notes (Signed)
Summary: refill request for ambien  Phone Note Refill Request Message from:  Fax from Pharmacy  Refills Requested: Medication #1:  AMBIEN 10 MG  TABS one tab by mouth bedtime as needed for insomnia sparingly. Faxed form from [facility] is on your shelf.  Initial call taken by: Lowella Petties CMA,  Aug 13, 2009 4:37 PM  Follow-up for Phone Call        I did not order this or write this prescription. I will not sign this. I got one earlier today as well. I have prescribed this medication once for 30 pills 07/18/09 and once in Apr of 2008. I am not in favor of him using it routinely. Follow-up by: Shaune Leeks MD,  Aug 13, 2009 5:07 PM  Additional Follow-up for Phone Call Additional follow up Details #1::        Called pharmacy and denied refills.  Advised pt's wife, who said refills had not been requested.  She again said thank you for you calling her back. Additional Follow-up by: Lowella Petties CMA,  Aug 14, 2009 9:30 AM    Additional Follow-up for Phone Call Additional follow up Details #2::    Noted. Thank you. Follow-up by: Shaune Leeks MD,  Aug 14, 2009 9:37 AM

## 2010-05-07 NOTE — Letter (Signed)
Summary: [facility] - Medication List  [facility] - Medication List   Imported By: Marylou Mccoy 06/22/2009 12:30:52  _____________________________________________________________________  External Attachment:    Type:   Image     Comment:   External Document

## 2010-05-07 NOTE — Progress Notes (Signed)
Summary: Diabetes Testing Supplies [facility])  Phone Note From Other Clinic Call back at [phone]   Caller: [facility] Call For: Dr. Hetty Ely Summary of Call: Received faxed form for diabetes supplies, please advise.  Form in your [place]  Initial call taken by: Linde Gillis CMA Duncan Dull),  July 25, 2009 9:59 AM  Follow-up for Phone Call        Spoke to patient and was informed that he had to change from [place] because of his Insurance. Sydell Axon LPN  July 25, 2009 10:55 AM Done. Shaune Leeks MD  July 25, 2009 11:45 AM   Follow-up by: Shaune Leeks MD,  July 25, 2009 11:45 AM  Additional Follow-up for Phone Call Additional follow up Details #1::        Completed form faxed back to [facility]. Additional Follow-up by: Sydell Axon LPN,  July 25, 2009 11:56 AM  New Problems: DIABETES MELLITUS, TYPE I, ADULT ONSET (ICD-250.01) DIABETES MELLITUS, TYPE II (ICD-250.00)   New Problems: DIABETES MELLITUS, TYPE I, ADULT ONSET (ICD-250.01) DIABETES MELLITUS, TYPE II (ICD-250.00)

## 2010-05-07 NOTE — Progress Notes (Signed)
Summary: Rx Alprazolam  Phone Note Refill Request Call back at [phone] Message from:  [facility] on July 18, 2009 11:07 AM  Refills Requested: Medication #1:  AMBIEN 10 MG  TABS one tab by mouth bedtime as needed for insomnia sparingly.   Last Refilled: 03/14/2009 Received faxed refill request please advise.   Method Requested: Telephone to Pharmacy Initial call taken by: Linde Gillis CMA Duncan Dull),  July 18, 2009 11:07 AM     Appended Document: Rx Alprazolam Already done this date.

## 2010-05-07 NOTE — Assessment & Plan Note (Signed)
Summary: [name] [name]   CC:  Shortness of breath AT TIMES.  History of Present Illness: Patient is 76 years old and return for followup management of CHF after his recent hospitalization for CHF. He had bypass surgery in 2008 and has an ischemic heart myopathy with an ejection fraction of 30-35%. He also has a backup ICD that was implanted in 2010.  He was recently admitted with congestive heart failure and his ejection fraction had fallen to 15-20%. He was diuresed with improvement. A troponin of 0.8 but we thought this was related to his heart failure and noted to catheterization again. His [place] the range of 1.4.  His past history is significant for a 60-80% left carotid stenosis.  Since he's been home he's done fairly well. His watch his diet closely his weight himself daily. His weight has followed him to 41 at discharge at 237 this morning.  Current Medications (verified): 1)  Plavix 75 Mg Tabs (Clopidogrel Bisulfate) .... Take 1 Tablet By Mouth Once A Day 2)  Lasix 40 Mg Tabs (Furosemide) .... Take 1 Tablet By Mouth Once A Day 3)  Levemir Flexpen  Soln (Insulin Detemir Soln) .... 20 Units in The Morning and 20 Units in The Evening. 4)  Crestor 20 Mg Tabs (Rosuvastatin Calcium) .... One Tablet By Mouth At Bedtime 5)  Metformin Hcl 1000 Mg Tabs (Metformin Hcl) .Marland Kitchen.. 1  Tab By Mouth Two Times A Day By Mouth 6)  Spironolactone 25 Mg  Tabs (Spironolactone) .... 1/2  Daily By Mouth 7)  Cinnamon 500 Mg  Caps (Cinnamon) .... 2 Tablets Daily By Mouth 8)  Ambien 10 Mg  Tabs (Zolpidem Tartrate) .... One Tab By Mouth Bedtime As Needed For Insomnia Sparingly. 9)  Diazepam 10 Mg  Tabs (Diazepam) .Marland Kitchen.. 1 At Bedtime As Needed For Ringing in The Ears 10)  Omega 3 Plus Vitamins .... 2 Tablets Daily By Mouth 11)  Alphasorb- C 500mg  .... 1 Daily By Mouth 12)  Joint Musle Vitamin .Marland Kitchen.. 1 Daily By Mouth 13)  Novolog Penfill 100 Unit/ml Soln (Insulin Aspart) .Marland Kitchen.. 10 Units Three Times A Day 14)  Nexium 40 Mg  Cpdr (Esomeprazole Magnesium) .... One Tab By Mouth 45 Mins Before Supper 15)  Accu-Chek Aviva  Strp (Glucose Blood) .... Use 1 Strip Twice A Day 16)  Coreg 12.5 Mg Tabs (Carvedilol) .... Take 1 Tablet By Mouth Twice A Day 17)  Klor-Con M20 20 Meq Cr-Tabs (Potassium Chloride Crys Cr) .... 2 By Mouth Daily 18)  Aspirin 81 Mg Tbec (Aspirin) .... Take One Tablet By Mouth Two Times A Day 19)  Lisinopril 20 Mg Tabs (Lisinopril) .... 1/2 By Mouth Daily  Allergies (verified): No Known Drug Allergies  Past History:  Past Medical History: Reviewed history from 06/04/2008 and no changes required. COPD: 1993 Diabetes mellitus, type II: 2000 Hyperlipidemia: 1993 Hypertension:1993 CHF Coronary artery disease status post coronary bypass graft surgery     in February 2008  Ischemic cardiomyopathy with ejection fraction of 30-35%. Class II systolic heart failure. S/P Single chamber ICD 2010  Review of Systems       ROS is negative except as outlined in HPI.   Vital Signs:  Patient profile:   76 year old male Height:      67 inches Weight:      244 pounds BMI:     38.35 Pulse rate:   74 / minute Resp:     18 per minute BP sitting:   157 / 90  (left  arm)  Vitals Entered By: Burnett Kanaris, CNA (June 01, 2009 3:35 PM)  Physical Exam  Additional Exam:  Gen. Well-nourished, in no distress   Neck: JVP questionably at the clavicle, thyroid not enlarged, no carotid bruits Lungs: No tachypnea, clear without rales, rhonchi or wheezes Cardiovascular: Rhythm regular, PMI not displaced,  heart sounds  normal, no murmurs or gallops, no peripheral edema, pulses normal in all 4 extremities. Abdomen: BS normal, abdomen soft and non-tender without masses or organomegaly, no hepatosplenomegaly. MS: No deformities, no cyanosis or clubbing   Neuro:  No focal sns   Skin:  no lesions    PPM Specifications Following MD:  Lewayne Bunting, MD     PPM Vendor:  [employer]     PPM Model Number:  [id number]      PPM Serial Number:  [id number] PPM DOI:  04/04/2008      Lead 1    Location: [facility]     DOI: 04/04/2008     Model #: [id number]     Serial #: [id number]     Status: active  ICD Specifications Following MD:  Lewayne Bunting, MD     ICD Vendor:  [employer]     ICD Model Number:  [id number]     ICD Serial Number:  [id number] ICD DOI:  04/04/2008      Lead 1:    Location: RV     DOI: 04/04/2008     Model #: [id number]     Serial #: [id number]     Status: active  ICD Follow Up ICD Dependent:  No      Brady Parameters Mode VVI     Lower Rate Limit:  40      Tachy Zones VF:  240     VT:  207     VT1:  176     Impression & Recommendations:  Problem # 1:  SYSTOLIC HEART FAILURE, CHRONIC (ICD-428.22)  He was recently hospitalized with systolic heart failure. He appears better and appears euvolemic today. His ejection fraction had fallen to 15-20%. We will increase his carvedilol from 25 mg one half tablet b.i.d. to a whole tablet in the morning and half tablet in the evening. We will increase his lisinopril from one half of a 20 mg tablet to a whole 20 mg tablet. We'll get a BMP today and BNP. In one week with a BMP. The following medications were removed from the medication list:    Prinivil 20 Mg Tabs (Lisinopril) ..... One tab by mouth twice a day    Amlodipine Besylate 10 Mg Tabs (Amlodipine besylate) .Marland Kitchen... 1 daily by mouth    Carvedilol 25 Mg Tabs (Carvedilol) .Marland Kitchen... 1 and 1/2 tablet twice a day by mouth    Tgt Aspirin 81 Mg Tbec (Aspirin) .Marland Kitchen... Take 1 tablet by mouth every morning His updated medication list for this problem includes:    Plavix 75 Mg Tabs (Clopidogrel bisulfate) .Marland Kitchen... Take 1 tablet by mouth once a day    Lasix 40 Mg Tabs (Furosemide) .Marland Kitchen... Take 1 tablet by mouth once a day    Spironolactone 25 Mg Tabs (Spironolactone) .Marland Kitchen... 1/2  daily by mouth    Carvedilol 25 Mg Tabs (Carvedilol) .Marland Kitchen... Take one tablet by mouth every morning and a half a tablet every evening    Aspirin 81 Mg Tbec (Aspirin) .Marland Kitchen...  Take one tablet by mouth two times a day    Lisinopril 20 Mg Tabs (Lisinopril) .Marland Kitchen... 1/2 by mouth daily  Orders: EKG w/  Interpretation (93000) T-Basic Metabolic Panel [phone]) T-CBC w/Diff [id number]) T-BNP  (B Natriuretic Peptide) ([id number])  Problem # 2:  CAD, AUTOLOGOUS BYPASS GRAFT (ICD-414.02)  He had previous bypass surgery in 2008. He had positive troponins during his recent hospitalization but we think this was related to heart failure. He's had no chest pain in the thumb appears stable. The following medications were removed from the medication list:    Prinivil 20 Mg Tabs (Lisinopril) ..... One tab by mouth twice a day    Amlodipine Besylate 10 Mg Tabs (Amlodipine besylate) .Marland Kitchen... 1 daily by mouth    Carvedilol 25 Mg Tabs (Carvedilol) .Marland Kitchen... 1 and 1/2 tablet twice a day by mouth    Tgt Aspirin 81 Mg Tbec (Aspirin) .Marland Kitchen... Take 1 tablet by mouth every morning His updated medication list for this problem includes:    Plavix 75 Mg Tabs (Clopidogrel bisulfate) .Marland Kitchen... Take 1 tablet by mouth once a day    Carvedilol 25 Mg Tabs (Carvedilol) .Marland Kitchen... Take one tablet by mouth every morning and a half a tablet every evening    Aspirin 81 Mg Tbec (Aspirin) .Marland Kitchen... Take one tablet by mouth two times a day    Lisinopril 20 Mg Tabs (Lisinopril) ..... One tablet by mouth daily  Orders: EKG w/ Interpretation (93000) T-Basic Metabolic Panel ([id number]) T-CBC w/Diff ([id number]) T-BNP  (B Natriuretic Peptide) ([id number])  Problem # 3:  PACEMAKER, PERMANENT (ICD-V45.01) He has a backup ICD. This has been stable.  Problem # 4:  HYPERTENSION (ICD-401.9) His blood pressure somewhat elevated today. We will increase his Coreg and lisinopril. The following medications were removed from the medication list:    Prinivil 20 Mg Tabs (Lisinopril) ..... One tab by mouth twice a day    Amlodipine Besylate 10 Mg Tabs (Amlodipine besylate) .Marland Kitchen... 1 daily by mouth    Carvedilol 25 Mg Tabs  (Carvedilol) .Marland Kitchen... 1 and 1/2 tablet twice a day by mouth    Tgt Aspirin 81 Mg Tbec (Aspirin) .Marland Kitchen... Take 1 tablet by mouth every morning His updated medication list for this problem includes:    Lasix 40 Mg Tabs (Furosemide) .Marland Kitchen... Take 1 tablet by mouth once a day    Spironolactone 25 Mg Tabs (Spironolactone) .Marland Kitchen... 1/2  daily by mouth    Carvedilol 25 Mg Tabs (Carvedilol) .Marland Kitchen... Take one tablet by mouth every morning and a half a tablet every evening    Aspirin 81 Mg Tbec (Aspirin) .Marland Kitchen... Take one tablet by mouth two times a day    Lisinopril 20 Mg Tabs (Lisinopril) ..... One tablet by mouth daily  The following medications were removed from the medication list:    Prinivil 20 Mg Tabs (Lisinopril) ..... One tab by mouth twice a day    Amlodipine Besylate 10 Mg Tabs (Amlodipine besylate) .Marland Kitchen... 1 daily by mouth    Carvedilol 25 Mg Tabs (Carvedilol) .Marland Kitchen... 1 and 1/2 tablet twice a day by mouth    Tgt Aspirin 81 Mg Tbec (Aspirin) .Marland Kitchen... Take 1 tablet by mouth every morning His updated medication list for this problem includes:    Lasix 40 Mg Tabs (Furosemide) .Marland Kitchen... Take 1 tablet by mouth once a day    Spironolactone 25 Mg Tabs (Spironolactone) .Marland Kitchen... 1/2  daily by mouth    Carvedilol 25 Mg Tabs (Carvedilol) .Marland Kitchen... Take one tablet by mouth every morning and a half a tablet every evening    Aspirin 81 Mg Tbec (Aspirin) .Marland Kitchen... Take one tablet by mouth two times a day  Lisinopril 20 Mg Tabs (Lisinopril) ..... One tablet by mouth daily  Patient Instructions: 1)  Your physician recommends that you schedule a follow-up appointment in: 2 months. 2)  Your physician recommends that you have lab work today: bmet/bnp/cbc (428.22;414.01;402.1)  & in 1 week- bmet (428.22;414.01;402.1) 3)  Your physician has recommended you make the following change in your medication: 1) Increase lisinopril to 20mg  once daily, 2) Increase coreg to 25mg  (a whole tablet) every morning and 12.5mg  in the evening.

## 2010-05-07 NOTE — Progress Notes (Signed)
Summary: question if new medication is ok  Phone Note Call from Patient Call back at Home Phone [phone]   Caller: Spouse / [name] Reason for Call: Talk to Nurse, Talk to Doctor Summary of Call: pt was given simvastatin 80mg  and levotayroxine sodium by Dr. Lucianne Muss and they want to know is it ok for him to take this medication Initial call taken by: Omer Jack,  May 17, 2009 4:58 PM  Follow-up for Phone Call        talked with wife,[name]--pt had seen Dr Lucianne Muss last week and was given simvastatin and levothyroxine--pt is unsure why Dr Lucianne Muss prescribed these medications--pt went in the hospital 05-12-09 and did not get medication from Dr Lucianne Muss until today-wife asking if he should take these medications-pt D/C 05-16-09 from hospital on Crestor 20mg  --pt will not take simvastatin---I do not see any note in hospital records about hypothyroidism--Pt will contact Dr Lucianne Muss about the levothroxine

## 2010-05-07 NOTE — Assessment & Plan Note (Signed)
Summary: FOLLOW UP DM PER DR SCHALLER/[facility]   Vital Signs:  Patient profile:   76 year old male Height:      68 inches Weight:      242.75 pounds BMI:     37.04 Temp:     98.4 degrees F oral Pulse rate:   68 / minute Pulse rhythm:   regular BP sitting:   126 / 72  (left arm) Cuff size:   large  Vitals Entered By: Delilah Shan CMA Duncan Dull) (November 13, 2009 8:15 AM) CC: Follow up - DM per [facility]NS   History of Present Illness: Diabetes: labs reviewed and A1c improved Using medications without difficulties: yes Hypoglycemic episodes: less with new sliding scale Hyperglycemic episodes: less with new sliding scale Feet problems: no Blood Sugars averaging:  ~120 in AM eye exam within last year: approx 1 year ago, follow up pending per patient  Sliding scale for humalog: glucose:120-150    insulin dose 15               150-190                        17               190-210                        19               210-230                        21  Heart failure: followed by cards.  No CP.  No dyspnea.    Allergies: No Known Drug Allergies  Past History:  Past Medical History: Last updated: 06/04/2008 COPD: 1993 Diabetes mellitus, type II: 2000 Hyperlipidemia: 1993 Hypertension:1993 CHF Coronary artery disease status post coronary bypass graft surgery     in February 2008  Ischemic cardiomyopathy with ejection fraction of 30-35%. Class II systolic heart failure. 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  occ tob, cigar, prev increase tobacco use likes to build model airplanes, likes fishing  Social History: Reviewed history from 06/05/2008 and no changes required. 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  occ tob, cigar, prev increase tobacco use likes to build model airplanes, likes fishing  Review of Systems       See HPI.  Otherwise negative.    Physical Exam  General:  GEN: nad, alert and oriented HEENT: mucous membranes moist NECK: supple w/o LA CV: rrr.  pacer palpated on L chest wall. sternotomy scar healed. PULM: ctab, no inc wob ABD: soft, +bs EXT: no  edema, 1+ DP pulses SKIN: no acute rash   Diabetes Management Exam:    Foot Exam (with socks and/or shoes not present):       Sensory-Pinprick/Light touch:          Left medial foot (L-4): normal          Left dorsal foot (L-5): normal          Left lateral foot (S-1): normal          Right medial foot (L-4): normal          Right dorsal foot (L-5): normal          Right lateral foot (S-1): normal       Sensory-Monofilament:          Left foot: normal          Right foot: normal       Inspection:          Left foot: normal          Right foot: normal       Nails:          Left foot: thickened          Right foot: thickened   Impression & Recommendations:  Problem # 1:  DIABETES MELLITUS, TYPE II (ICD-250.00) No change in meds.  Labs reviewed. >25 min spent with patient, at least half of which was spent on counseling on DM2. His updated medication list for this problem includes:    Levemir Flexpen  Soln (Insulin detemir soln) .Marland Kitchen... 20 units every morning    Metformin Hcl 1000 Mg Tabs (Metformin hcl) .Marland Kitchen... 1  tab by mouth two times a day by mouth    Novolog Penfill 100 Unit/ml Soln (Insulin aspart) ..... Sliding scale - three times a day    Aspirin 81 Mg Tbec (Aspirin) .Marland Kitchen... Take one tablet by mouth two times a day  Complete Medication List: 1)  Plavix 75 Mg Tabs (Clopidogrel bisulfate) .... Take 1 tablet by mouth once a day 2)  Lasix 40 Mg Tabs (Furosemide) .... Take 1 tablet by mouth once a day 3)  Levemir Flexpen Soln (Insulin detemir soln) .... 20 units every morning 4)  Crestor 20 Mg Tabs (Rosuvastatin calcium) .... One tablet by mouth at bedtime 5)  Metformin Hcl 1000 Mg Tabs (Metformin hcl) .Marland Kitchen.. 1  tab by mouth two times a day by mouth 6)  Spironolactone 25 Mg Tabs (Spironolactone) .... 1/2  daily by mouth 7)  Cinnamon 500 Mg Caps (Cinnamon) .... 2 tablets daily by mouth 8)  Ambien 10 Mg Tabs (Zolpidem tartrate) .... One tab by mouth bedtime as needed for insomnia sparingly. 9)  Diazepam 10 Mg Tabs (Diazepam) .Marland Kitchen.. 1 at bedtime as needed for ringing in the ears 10)  Omega 3 Plus Vitamins  .... 2 tablets daily by mouth 11)  Novolog Penfill 100 Unit/ml Soln (Insulin aspart) .... Sliding scale - three times a day 12)  Accu-chek Aviva Strp (Glucose blood) .... Use 1 strip twice a day 13)  Carvedilol 25 Mg Tabs (Carvedilol) .... Take 1 and 1/2 tab two times a day 14)  Klor-con M20 20 Meq Cr-tabs (Potassium chloride crys cr) .... 2 by mouth daily 15)  Aspirin 81 Mg Tbec (Aspirin) .... Take one tablet by mouth two times a day 16)  Lisinopril 10 Mg Tabs (lisinopril)  .... One tablet by mouth daily  Patient Instructions: 1)  I want to see you back in 3 months.  2)  Come by for labs a few days before the visit.  You can eat before you have labs drawn. 3)  HBG A1c 250.00. 4)  Don't change your meds.   Current Allergies (reviewed today): No known allergies

## 2010-05-07 NOTE — Letter (Signed)
Summary: Generic Letter  [facility], Main Office  [address]   [place], [place] [address]   Phone: [phone]  Fax: [phone]        February 20, 2010 MRN: [id number]    [name] [name] [address] [place], [place]  [address]    Dear [name],  This is just to inform you that Dr. Juanda Chance has reviewed your most recent labwork. He had checked you electrolytes (sodium, potassium, and kidney function), your blood counts to check for anemia, and a fluid level. These readings were all ok. If you have any questions, please call us at [phone].          Sincerely,  Sherri Rad, RN, BSN  This letter has been electronically signed by your physician.

## 2010-05-07 NOTE — Letter (Signed)
Summary: [facility]  [facility]   Imported By: Marylou Mccoy 03/15/2010 18:00:33  _____________________________________________________________________  External Attachment:    Type:   Image     Comment:   External Document

## 2010-05-07 NOTE — Progress Notes (Signed)
Summary: glucose meter   Phone Note Call from Patient Call back at Home Phone [phone]   Caller: Spouse- [name]  Call For: [name] Leeks MD Summary of Call: Patient can no longer get test strips for his current meter, he is requesting rx for new glucose meter. Uses [facility] on [address]  Initial call taken by: Melody Comas,  Aug 08, 2009 10:05 AM  Follow-up for Phone Call        We just sent a request to [facility] last month. Pls have them calll [facility] and see what is the holdup. Follow-up by: [name] Leeks MD,  Aug 08, 2009 11:36 AM  Additional Follow-up for Phone Call Additional follow up Details #1::        Patient notified. Will call [facility].  Melody Comas  Aug 08, 2009 11:40 AM

## 2010-05-07 NOTE — Miscellaneous (Signed)
Summary: [facility]  [facility]   Imported By: Roderic Ovens 09/04/2009 14:49:34  _____________________________________________________________________  External Attachment:    Type:   Image     Comment:   External Document

## 2010-05-07 NOTE — Assessment & Plan Note (Signed)
Summary: 3 MONTH FOLLOW UP/[facility]   Vital Signs:  Patient profile:   76 year old male Height:      68 inches Weight:      252.50 pounds BMI:     38.53 Temp:     98.3 degrees F oral Pulse rate:   88 / minute Pulse rhythm:   regular BP sitting:   150 / 72  (left arm) Cuff size:   large  Vitals Entered By: Delilah Shan CMA Duncan Dull) (February 18, 2010 10:55 AM) CC: 3 months follow up.  Lab report given to patient.   History of Present Illness: Diabetes:  Using medications without difficulties: yes Hypoglycemic episodes:no Hyperglycemic episodes:no Feet problems:no Blood Sugars averaging: ~140 in AM  Slipped last week and hit L knee.  Getting better but exercise has been on hold in meantime.  Working to lose weight.  Using insulin with sliding scale.  Still working on diet.  Minimal alcohol per patient.  Up 10lbs since last OV here.  Not exercising as much as before.    Taking 20 of levemir in AM  Sliding scale for Novalog (before meal glucose reading): glucose:120-150    insulin dose 15               150-190                        17               190-210                        19               210-230                        21  We talked about two-three times a day dosing of humalog.    Insomnia.  Had been using ambien with relief.  "I sleep good."  no adverse effect.  Episodic use.  Not drinking alcohol to excess.   Allergies: No Known Drug Allergies  Past History:  Past Medical History: Last updated: 06/04/2008 COPD: 1993 Diabetes mellitus, type II: 2000 Hyperlipidemia: 1993 Hypertension:1993 CHF Coronary artery disease status post coronary bypass graft surgery     in February 2008  Ischemic cardiomyopathy with ejection fraction of 30-35%. Class II systolic heart failure. S/P Single chamber ICD 2010  Review of Systems       See HPI.  Otherwise negative.    Physical Exam  General:  GEN: nad, alert and oriented HEENT: mucous membranes moist NECK: supple w/o  LA CV: rrr.  pacer palpated on L chest wall. sternotomy scar healed. PULM: ctab, no inc wob ABD: soft, +bs EXT: no edema, 1+ DP pulses SKIN: no acute rash  L knee mildly tender to palpation medially but o/w w/o acute change noted.   Impression & Recommendations:  Problem # 1:  DIABETES MELLITUS, TYPE II (ICD-250.00) >25 min spent with patient, at least half of which was spent on counseling re:DM and meds.  We talked about nutrition and he declined referral.  We talked about exercise and diet with goal of 1lb per month.  I think his knee pain will gradually improve.  He is already in knee sleeve with some relief.  He has treadmill at home, but he isn't using it.  He may need three times a day short acting insulin if A1c  continues to increase.  See instructions.  Supper is his biggest meal, so will add on SSI at that point. Pt to contact me with questions about his glucose readings in the meantime.  His updated medication list for this problem includes:    Levemir Flexpen Soln (Insulin detemir soln) .Marland Kitchen... 20 units every morning    Metformin Hcl 1000 Mg Tabs (Metformin hcl) .Marland Kitchen... 1  tab by mouth two times a day by mouth    Novolog Penfill 100 Unit/ml Soln (Insulin aspart) ..... Sliding scale - three times a day    Aspirin 81 Mg Tbec (Aspirin) .Marland Kitchen... Take one tablet by mouth two times a day    Lisinopril 20 Mg Tabs (Lisinopril) .Marland Kitchen... Take one tablet by mouth daily  Problem # 2:  INSOMNIA (ICD-780.52)  His updated medication list for this problem includes:    Ambien 10 Mg Tabs (Zolpidem tartrate) ..... One tab by mouth bedtime as needed for insomnia sparingly.  Complete Medication List: 1)  Plavix 75 Mg Tabs (Clopidogrel bisulfate) .... Take 1 tablet by mouth once a day 2)  Lasix 40 Mg Tabs (Furosemide) .... Take 1 tablet by mouth once a day 3)  Levemir Flexpen Soln (Insulin detemir soln) .... 20 units every morning 4)  Crestor 20 Mg Tabs (Rosuvastatin calcium) .... One tablet by mouth at  bedtime 5)  Metformin Hcl 1000 Mg Tabs (Metformin hcl) .Marland Kitchen.. 1  tab by mouth two times a day by mouth 6)  Spironolactone 25 Mg Tabs (Spironolactone) .... 1/2  daily by mouth 7)  Cinnamon 500 Mg Caps (Cinnamon) .... 2 tablets daily by mouth 8)  Ambien 10 Mg Tabs (Zolpidem tartrate) .... One tab by mouth bedtime as needed for insomnia sparingly. 9)  Omega 3 Plus Vitamins  .... 2 tablets daily by mouth 10)  Novolog Penfill 100 Unit/ml Soln (Insulin aspart) .... Sliding scale - three times a day 11)  Accu-chek Aviva Strp (Glucose blood) .... Use 1 strip twice a day 12)  Carvedilol 25 Mg Tabs (Carvedilol) .... Take 1 and 1/2 tab two times a day 13)  Klor-con M20 20 Meq Cr-tabs (Potassium chloride crys cr) .... 2 by mouth daily 14)  Aspirin 81 Mg Tbec (Aspirin) .... Take one tablet by mouth two times a day 15)  Lisinopril 20 Mg Tabs (Lisinopril) .... Take one tablet by mouth daily  Patient Instructions: 1)  I want you to use the humalog insulin two times a day based on your sugar.  Let me know if you have questions in the meantime.  You can call me with concerns. Take care.   2)  Recheck A1c in 3 months, 250.00.  30 min OV a few days later.    Orders Added: 1)  Est. Patient Level IV [[address]109]    Current Allergies (reviewed today): No known allergies

## 2010-05-07 NOTE — Progress Notes (Signed)
Summary: metformin  Phone Note Call from Patient Message from:  Pharmacy on June 15, 2009 4:49 PM  Refills Requested: Medication #1:  METFORMIN HCL 1000 MG TABS 1  tab by mouth two times a day by mouth Patient will no longer be using mail order, wants to use [facility]. Pharmacy called requesting rx to keep on file.   Initial call taken by: Melody Comas,  June 15, 2009 4:50 PM  Follow-up for Phone Call        px written on EMR for call in  Follow-up by: Judith Part MD,  June 15, 2009 5:10 PM  Additional Follow-up for Phone Call Additional follow up Details #1::        Medication phoned to [facility] pharmacy as instructed. Lewanda Rife LPN  June 15, 2009 5:25 PM     New/Updated Medications: METFORMIN HCL 1000 MG TABS (METFORMIN HCL) 1  tab by mouth two times a day by mouth Prescriptions: METFORMIN HCL 1000 MG TABS (METFORMIN HCL) 1  tab by mouth two times a day by mouth  #60 x 1   Entered and Authorized by:   Judith Part MD   Signed by:   Lewanda Rife LPN on 04/54/0981   Method used:   Telephoned to ...       [facility]* (retail)       [address]       [place], [place]  [address]       Ph: [phone]       Fax: [phone]   RxID:   [id number]

## 2010-05-07 NOTE — Letter (Signed)
Summary: [facility]  [facility]   Imported By: Marylou Mccoy 06/22/2009 12:41:54  _____________________________________________________________________  External Attachment:    Type:   Image     Comment:   External Document

## 2010-05-07 NOTE — Miscellaneous (Signed)
Summary: [facility] Discharge Plan   [facility] Discharge Plan   Imported By: Roderic Ovens 10/22/2009 13:26:57  _____________________________________________________________________  External Attachment:    Type:   Image     Comment:   External Document

## 2010-05-07 NOTE — Assessment & Plan Note (Signed)
Summary: f61m   Visit Type:  Follow-up Primary Provider:  Shaune Leeks MD  CC:  no complaints.  History of Present Illness: Tpatient is 76 years old and return for management of CHF and CAD. He had bypass surgery in 2008. He has severe left ventricular function with an ejection fraction of 30-35% which had decreased to 20%. He has a backup ICD.  He was hospitalized last year with CHF but since that time he has done better. He says he said no swelling or shortness of breath and no chest pain. His biggest problem is related to control his diabetes. He has seen another physician since his primary care physician retired but this has not worked out very well.  Current Medications (verified): 1)  Plavix 75 Mg Tabs (Clopidogrel Bisulfate) .... Take 1 Tablet By Mouth Once A Day 2)  Lasix 40 Mg Tabs (Furosemide) .... Take 1 Tablet By Mouth Once A Day 3)  Levemir Flexpen  Soln (Insulin Detemir Soln) .... 28 Units in The Evening. 4)  Crestor 20 Mg Tabs (Rosuvastatin Calcium) .... One Tablet By Mouth At Bedtime 5)  Metformin Hcl 1000 Mg Tabs (Metformin Hcl) .Marland Kitchen.. 1  Tab By Mouth Two Times A Day By Mouth 6)  Spironolactone 25 Mg  Tabs (Spironolactone) .... 1/2  Daily By Mouth 7)  Cinnamon 500 Mg  Caps (Cinnamon) .... 2 Tablets Daily By Mouth 8)  Ambien 10 Mg  Tabs (Zolpidem Tartrate) .... One Tab By Mouth Bedtime As Needed For Insomnia Sparingly. 9)  Diazepam 10 Mg  Tabs (Diazepam) .Marland Kitchen.. 1 At Bedtime As Needed For Ringing in The Ears 10)  Omega 3 Plus Vitamins .... 2 Tablets Daily By Mouth 11)  Alphasorb- C 500mg  .... 1 Daily By Mouth 12)  Joint Musle Vitamin .Marland Kitchen.. 1 Daily By Mouth 13)  Novolog Penfill 100 Unit/ml Soln (Insulin Aspart) .Marland Kitchen.. 14 Units Every Evening 14)  Nexium 40 Mg Cpdr (Esomeprazole Magnesium) .... One Tab By Mouth 45 Mins Before Supper 15)  Accu-Chek Aviva  Strp (Glucose Blood) .... Use 1 Strip Twice A Day 16)  Carvedilol 25 Mg Tabs (Carvedilol) .... Take 1 and 1/2 Tab Two Times  A Day 17)  Klor-Con M20 20 Meq Cr-Tabs (Potassium Chloride Crys Cr) .... 2 By Mouth Daily 18)  Aspirin 81 Mg Tbec (Aspirin) .... Take One Tablet By Mouth Two Times A Day 19)  Lisinopril 10 Mg Tabs (Lisinopril) .... One Tablet By Mouth Daily  Allergies (verified): No Known Drug Allergies  Past History:  Past Medical History: Reviewed history from 06/04/2008 and no changes required. COPD: 1993 Diabetes mellitus, type II: 2000 Hyperlipidemia: 1993 Hypertension:1993 CHF Coronary artery disease status post coronary bypass graft surgery     in February 2008  Ischemic cardiomyopathy with ejection fraction of 30-35%. Class II systolic heart failure. S/P Single chamber ICD 2010  Review of Systems       ROS is negative except as outlined in HPI.   Vital Signs:  Patient profile:   76 year old male Height:      68 inches Weight:      244 pounds BMI:     37.23 Pulse rate:   69 / minute BP sitting:   150 / 80  (left arm) Cuff size:   large  Vitals Entered By: Burnett Kanaris, CNA (October 30, 2009 2:04 PM)  Physical Exam  Additional Exam:  Gen. Well-nourished, in no distress   Neck: JVP just at the clavicle, thyroid not enlarged, no carotid  bruits Lungs: No tachypnea, clear without rales, rhonchi or wheezes Cardiovascular: Rhythm regular, PMI not displaced,  heart sounds  normal, no murmurs or gallops, no peripheral edema, pulses normal in all 4 extremities. Abdomen: BS normal, abdomen soft and non-tender without masses or organomegaly, no hepatosplenomegaly. MS: No deformities, no cyanosis or clubbing   Neuro:  No focal sns   Skin:  no lesions    PPM Specifications Following MD:  Lewayne Bunting, MD     PPM Vendor:  [employer]     PPM Model Number:  [id number]     PPM Serial Number:  [id number] PPM DOI:  04/04/2008      Lead 1    Location: RV     DOI: 04/04/2008     Model #: [id number]     Serial #: [id number]     Status: active  ICD Specifications Following MD:  Lewayne Bunting, MD     ICD  Vendor:  [employer]     ICD Model Number:  [id number]     ICD Serial Number:  [id number] ICD DOI:  04/04/2008      Lead 1:    Location: RV     DOI: 04/04/2008     Model #: [id number]     Serial #: [id number]     Status: active  ICD Follow Up ICD Dependent:  No      Brady Parameters Mode VVI     Lower Rate Limit:  40      Tachy Zones VF:  >207     VT:  207 (VIA VF)     VT1:  176     Impression & Recommendations:  Problem # 1:  SYSTOLIC HEART FAILURE, CHRONIC (ICD-428.22)  He has borderline JVD today but I think overall his volume status is pretty good. He has not had any decreased exercise tolerance. This problem appears stable on current medications. We will get a BMP. His updated medication list for this problem includes:    Plavix 75 Mg Tabs (Clopidogrel bisulfate) .Marland Kitchen... Take 1 tablet by mouth once a day    Lasix 40 Mg Tabs (Furosemide) .Marland Kitchen... Take 1 tablet by mouth once a day    Spironolactone 25 Mg Tabs (Spironolactone) .Marland Kitchen... 1/2  daily by mouth    Carvedilol 25 Mg Tabs (Carvedilol) .Marland Kitchen... Take 1 and 1/2 tab two times a day    Aspirin 81 Mg Tbec (Aspirin) .Marland Kitchen... Take one tablet by mouth two times a day  Orders: EKG w/ Interpretation (93000) TLB-BMP (Basic Metabolic Panel-BMET) (80048-METABOL)  Problem # 2:  CAD, AUTOLOGOUS BYPASS GRAFT (ICD-414.02) he has had previous bypass surgery. He's had no recent chest pains, appears stable. His updated medication list for this problem includes:    Plavix 75 Mg Tabs (Clopidogrel bisulfate) .Marland Kitchen... Take 1 tablet by mouth once a day    Carvedilol 25 Mg Tabs (Carvedilol) .Marland Kitchen... Take 1 and 1/2 tab two times a day    Aspirin 81 Mg Tbec (Aspirin) .Marland Kitchen... Take one tablet by mouth two times a day  His updated medication list for this problem includes:    Plavix 75 Mg Tabs (Clopidogrel bisulfate) .Marland Kitchen... Take 1 tablet by mouth once a day    Carvedilol 25 Mg Tabs (Carvedilol) .Marland Kitchen... Take 1 and 1/2 tab two times a day    Aspirin 81 Mg Tbec (Aspirin) .Marland Kitchen... Take one  tablet by mouth two times a day  Orders: EKG w/ Interpretation (93000) TLB-BMP (Basic Metabolic Panel-BMET) (80048-METABOL)  Problem # 3:  PACEMAKER,  PERMANENT (ICD-V45.01) He has a backup ICD followed by Dr. Ladona Ridgel.  Problem # 4:  HYPERTENSION (ICD-401.9)  His blood pressure borderline today. We'll continue current therapy. His updated medication list for this problem includes:    Lasix 40 Mg Tabs (Furosemide) .Marland Kitchen... Take 1 tablet by mouth once a day    Spironolactone 25 Mg Tabs (Spironolactone) .Marland Kitchen... 1/2  daily by mouth    Carvedilol 25 Mg Tabs (Carvedilol) .Marland Kitchen... Take 1 and 1/2 tab two times a day    Aspirin 81 Mg Tbec (Aspirin) .Marland Kitchen... Take one tablet by mouth two times a day  His updated medication list for this problem includes:    Lasix 40 Mg Tabs (Furosemide) .Marland Kitchen... Take 1 tablet by mouth once a day    Spironolactone 25 Mg Tabs (Spironolactone) .Marland Kitchen... 1/2  daily by mouth    Carvedilol 25 Mg Tabs (Carvedilol) .Marland Kitchen... Take 1 and 1/2 tab two times a day    Aspirin 81 Mg Tbec (Aspirin) .Marland Kitchen... Take one tablet by mouth two times a day  Patient Instructions: 1)  Your physician recommends that you schedule a follow-up appointment in: 4 MONTHS 2)  Your physician recommends that you have lab work today: BMP 3)  Your physician recommends that you continue on your current medications as directed. Please refer to the Current Medication list given to you today. 4)  Please call [facility] and arrange follow-up.

## 2010-05-07 NOTE — Letter (Signed)
Summary: [facility] Progress Report   [facility] Progress Report   Imported By: Roderic Ovens 01/09/2010 12:10:32  _____________________________________________________________________  External Attachment:    Type:   Image     Comment:   External Document

## 2010-05-07 NOTE — Assessment & Plan Note (Signed)
Summary: f2m   Visit Type:  Follow-up Primary Provider:  Shaune Leeks MD  CC:  no complaints.  History of Present Illness: The patient is 76 years old and return for management of CAD and CHF. He had bypass surgery in 2008. He has an ischemic cardiomyopathy with an ejection fraction of 30-35%. He has a backup ICD. He was recently admitted with congestive heart failure and an echocardiogram showed his ejection fraction of only 20%. This was in February. He has done well since that time. At the time of his last visit we increased his Coreg and lisinopril.  He's been doing fairly well. He's had no chest pain shortness of breath or palpitations.  He's not been able to lose weight.  Current Medications (verified): 1)  Plavix 75 Mg Tabs (Clopidogrel Bisulfate) .... Take 1 Tablet By Mouth Once A Day 2)  Lasix 40 Mg Tabs (Furosemide) .... Take 1 Tablet By Mouth Once A Day 3)  Levemir Flexpen  Soln (Insulin Detemir Soln) .... 20 Units in The Morning and 20 Units in The Evening. 4)  Crestor 20 Mg Tabs (Rosuvastatin Calcium) .... One Tablet By Mouth At Bedtime 5)  Metformin Hcl 1000 Mg Tabs (Metformin Hcl) .Marland Kitchen.. 1  Tab By Mouth Two Times A Day By Mouth 6)  Spironolactone 25 Mg  Tabs (Spironolactone) .... 1/2  Daily By Mouth 7)  Cinnamon 500 Mg  Caps (Cinnamon) .... 2 Tablets Daily By Mouth 8)  Ambien 10 Mg  Tabs (Zolpidem Tartrate) .... One Tab By Mouth Bedtime As Needed For Insomnia Sparingly. 9)  Diazepam 10 Mg  Tabs (Diazepam) .Marland Kitchen.. 1 At Bedtime As Needed For Ringing in The Ears 10)  Omega 3 Plus Vitamins .... 2 Tablets Daily By Mouth 11)  Alphasorb- C 500mg  .... 1 Daily By Mouth 12)  Joint Musle Vitamin .Marland Kitchen.. 1 Daily By Mouth 13)  Novolog Penfill 100 Unit/ml Soln (Insulin Aspart) .Marland Kitchen.. 10 Units Three Times A Day 14)  Nexium 40 Mg Cpdr (Esomeprazole Magnesium) .... One Tab By Mouth 45 Mins Before Supper 15)  Accu-Chek Aviva  Strp (Glucose Blood) .... Use 1 Strip Twice A Day 16)  Carvedilol  25 Mg Tabs (Carvedilol) .... Take One Tablet By Mouth Every Morning and A Half A Tablet Every Evening 17)  Klor-Con M20 20 Meq Cr-Tabs (Potassium Chloride Crys Cr) .... 2 By Mouth Daily 18)  Aspirin 81 Mg Tbec (Aspirin) .... Take One Tablet By Mouth Two Times A Day 19)  Lisinopril 20 Mg Tabs (Lisinopril) .... One Tablet By Mouth Daily  Allergies (verified): No Known Drug Allergies  Past History:  Past Medical History: Reviewed history from 06/04/2008 and no changes required. COPD: 1993 Diabetes mellitus, type II: 2000 Hyperlipidemia: 1993 Hypertension:1993 CHF Coronary artery disease status post coronary bypass graft surgery     in February 2008  Ischemic cardiomyopathy with ejection fraction of 30-35%. Class II systolic heart failure. S/P Single chamber ICD 2010  Review of Systems       ROS is negative except as outlined in HPI.   Vital Signs:  Patient profile:   76 year old male Height:      67 inches Weight:      243 pounds Pulse rate:   77 / minute BP sitting:   134 / 798  (left arm) Cuff size:   large  Vitals Entered By: Burnett Kanaris, CNA (August 03, 2009 2:37 PM)  Physical Exam  Additional Exam:  Gen. Well-nourished, in no distress   Neck:  No JVD, thyroid not enlarged, no carotid bruits Lungs: No tachypnea, clear without rales, rhonchi or wheezes Cardiovascular: Rhythm regular, PMI not displaced,  heart sounds  normal, no murmurs or gallops, no peripheral edema, pulses normal in all 4 extremities. Abdomen: BS normal, abdomen soft and non-tender without masses or organomegaly, no hepatosplenomegaly. MS: No deformities, no cyanosis or clubbing   Neuro:  No focal sns   Skin:  no lesions    PPM Specifications Following MD:  Lewayne Bunting, MD     PPM Vendor:  [employer]     PPM Model Number:  [id number]     PPM Serial Number:  [id number] PPM DOI:  04/04/2008      Lead 1    Location: RV     DOI: 04/04/2008     Model #: [id number]     Serial #: [id number]     Status:  active  ICD Specifications Following MD:  Lewayne Bunting, MD     ICD Vendor:  [employer]     ICD Model Number:  [id number]     ICD Serial Number:  [id number] ICD DOI:  04/04/2008      Lead 1:    Location: RV     DOI: 04/04/2008     Model #: [id number]     Serial #: [id number]     Status: active  ICD Follow Up ICD Dependent:  No      Brady Parameters Mode VVI     Lower Rate Limit:  40      Tachy Zones VF:  240     VT:  207     VT1:  176     Impression & Recommendations:  Problem # 1:  CAD, AUTOLOGOUS BYPASS GRAFT (ICD-414.02) Hhad previous bypass surgery in 2008. He's had no recent chest pain and this problem appears stable. His updated medication list for this problem includes:    Plavix 75 Mg Tabs (Clopidogrel bisulfate) .Marland Kitchen... Take 1 tablet by mouth once a day    Carvedilol 25 Mg Tabs (Carvedilol) .Marland Kitchen... Take one tablet by mouth every morning and a half a tablet every evening    Aspirin 81 Mg Tbec (Aspirin) .Marland Kitchen... Take one tablet by mouth two times a day    Lisinopril 20 Mg Tabs (Lisinopril) ..... One tablet by mouth daily  Problem # 2:  SYSTOLIC HEART FAILURE, CHRONIC (ICD-428.22) He has a ischemic cardiomyopathy with an ejection fraction of 30-35% and systolic CHF. He appears euvolemic today. We will continue current therapy. His updated medication list for this problem includes:    Plavix 75 Mg Tabs (Clopidogrel bisulfate) .Marland Kitchen... Take 1 tablet by mouth once a day    Lasix 40 Mg Tabs (Furosemide) .Marland Kitchen... Take 1 tablet by mouth once a day    Spironolactone 25 Mg Tabs (Spironolactone) .Marland Kitchen... 1/2  daily by mouth    Carvedilol 25 Mg Tabs (Carvedilol) .Marland Kitchen... Take one tablet by mouth every morning and a half a tablet every evening    Aspirin 81 Mg Tbec (Aspirin) .Marland Kitchen... Take one tablet by mouth two times a day    Lisinopril 20 Mg Tabs (Lisinopril) ..... One tablet by mouth daily  Problem # 3:  PACEMAKER, PERMANENT (ICD-V45.01) He has a backup ICD programmed at VVI at a low rate. He has had no clinical  discharges.  Patient Instructions: 1)  Your physician recommends that you schedule a follow-up appointment in: 3 months. 2)  Your physician recommends that you continue on your current medications as directed. Please  refer to the Current Medication list given to you today.

## 2010-05-07 NOTE — Progress Notes (Signed)
Summary: [facility]  Phone Note Refill Request Message from:  Fax from Pharmacy on December 04, 2009 1:17 PM  Refills Requested: Medication #1:  AMBIEN 10 MG  TABS one tab by mouth bedtime as needed for insomnia sparingly. Fax request from [facility]. [phone]  Initial call taken by: Melody Comas,  December 04, 2009 1:20 PM  Follow-up for Phone Call        please call in.  Follow-up by: Crawford Givens MD,  December 04, 2009 1:32 PM  Additional Follow-up for Phone Call Additional follow up Details #1::        Medication phoned to pharmacy.  Additional Follow-up by: Delilah Shan CMA [facility] Peifer Dull),  December 04, 2009 2:59 PM    Prescriptions: AMBIEN 10 MG  TABS (ZOLPIDEM TARTRATE) one tab by mouth bedtime as needed for insomnia sparingly.  #30 x 0   Entered and Authorized by:   Crawford Givens MD   Signed by:   Crawford Givens MD on 12/04/2009   Method used:   Telephoned to ...       [facility]* (retail)       [address]       [place], [place]  [address]       Ph: [phone]       Fax: [phone]   RxID:   [id number]

## 2010-05-07 NOTE — Letter (Signed)
Summary: [facility]  [facility] Progress [facility]   Imported By: Roderic Ovens 05/22/2009 14:14:10  _____________________________________________________________________  External Attachment:    Type:   Image     Comment:   External Document

## 2010-05-07 NOTE — Letter (Signed)
Summary: [name] [name] letter  [place] [facility]  [address] [place], [place] [address]   Phone: [phone]  Fax: [phone]       11/07/2009 MRN: [id number]  [name] [name] [address] [place], [place]  [address]  Dear [name],  [facility] [place], and [facility] announce the retirement of Arta Silence, M.D., from full-time practice at the [facility] office effective October 04, 2009 and his plans of returning part-time.  It is important to Dr. Hetty Ely and to our practice that you understand that [facility] has seven physicians in our office for your health care needs.  We will continue to offer the same exceptional care that you have today.    Dr. Hetty Ely has spoken to many of you about his plans for retirement and returning part-time in the fall.   We will continue to work with you through the transition to schedule appointments for you in the office and meet the high standards that [facility] is committed to.   Again, it is with great pleasure that we share the news that Dr. Hetty Ely will return to [facility] in October of 2011 with a reduced schedule.    If you have any questions, or would like to request an appointment with one of our physicians, please call us at [phone] and press the option for Scheduling an appointment.  We take pleasure in providing you with excellent patient care and look forward to seeing you at your next office visit.  Our [facility] Physicians are:  Tillman Abide, M.D. Laurita Quint, M.D. Roxy Manns, M.D. Kerby Nora, M.D. Hannah Beat, M.D. Ruthe Mannan, M.D. We proudly welcomed Raechel Ache, M.D. and Eustaquio Boyden, M.D. to the practice in July/August 2011.  Sincerely,  [facility]

## 2010-05-07 NOTE — Assessment & Plan Note (Signed)
Summary: 6 M F/U DLO   Vital Signs:  Patient profile:   76 year old male Weight:      245.75 pounds Temp:     98.1 degrees F oral Pulse rate:   76 / minute Pulse rhythm:   regular BP sitting:   150 / 86  (left arm) Cuff size:   large  Vitals Entered By: Sydell Axon LPN (Aug 13, 8117 10:02 AM) CC: 6 Month follow-up   History of Present Illness: Pt here for recheck of his diabetes and overall care. I have gotten multiple requests lately from different mail order houses for diabetic products for this pt. He sayas that none of them provide his strips because of some problem with his [employer] and he therefor gets shuffled around from vendor to vendor. Later in the day his wife called all upset and in the course of that discussion she said he was turned down at 2 different pharmacies and is currently waiting over 45 mins at [facility] to get his strips provided from the script I gave him today.  His DM insulin doses are changed today from what they had been prescribed last time and agian his wife told me on the phone that he has had some dizziness which he presumes is from his sugar and he cuts back or stops taking his medication. At the same time, when asked, she also relates that he does not eat regularly. All of these things we have discussed at length in the office multiple times...eating on a routine, eating 6 small meals a day, notr skipping meals when using insulin, esp when using humalog. He relates in the office that he checks his sugars regularly twice a day, fasting and supper time. His nos are typically 160-180. This correlates with the A1C just done of 7.7. His wife then relates he has not  checked his sugar in 2-3 weeks because he has not had strips to check with.  Problems Prior to Update: 1)  Diabetes Mellitus, Type I, Adult Onset  (ICD-250.01) 2)  Gerd  (ICD-530.81) 3)  Systolic Heart Failure, Chronic  (ICD-428.22) 4)  Pacemaker, Permanent  (ICD-V45.01) 5)  Cad, Autologous  Bypass Graft  (ICD-414.02) 6)  Tinnitus  (ICD-388.30) 7)  Screening For Malignant Neoplasm, Prostate  (ICD-V76.44) 8)  Advef, Drug/medicinal/biological Subst Nos  (ICD-995.20) 9)  Coronary Artery Disease S/p Non Qwave Mi  (ICD-414.00) 10)  Ami, Non-q Wave, Initial Episode  (ICD-410.71) 11)  Cardiomyopathy, Ischemic  (ICD-414.8) 12)  Congestive Heart Failure Class Iv  (ICD-428.0) 13)  Erectile Dysfunction, Organic  (ICD-607.84) 14)  Nicotine Addiction  (ICD-305.1) 15)  Insomnia  (ICD-780.52) 16)  Hypertension  (ICD-401.9) 17)  Hyperlipidemia  (ICD-272.4) 18)  Diabetes Mellitus, Type II  (ICD-250.00) 19)  COPD  (ICD-496)  Medications Prior to Update: 1)  Plavix 75 Mg Tabs (Clopidogrel Bisulfate) .... Take 1 Tablet By Mouth Once A Day 2)  Lasix 40 Mg Tabs (Furosemide) .... Take 1 Tablet By Mouth Once A Day 3)  Levemir Flexpen  Soln (Insulin Detemir Soln) .... 20 Units in The Morning and 20 Units in The Evening. 4)  Crestor 20 Mg Tabs (Rosuvastatin Calcium) .... One Tablet By Mouth At Bedtime 5)  Metformin Hcl 1000 Mg Tabs (Metformin Hcl) .Marland Kitchen.. 1  Tab By Mouth Two Times A Day By Mouth 6)  Spironolactone 25 Mg  Tabs (Spironolactone) .... 1/2  Daily By Mouth 7)  Cinnamon 500 Mg  Caps (Cinnamon) .... 2 Tablets Daily By Mouth  8)  Ambien 10 Mg  Tabs (Zolpidem Tartrate) .... One Tab By Mouth Bedtime As Needed For Insomnia Sparingly. 9)  Diazepam 10 Mg  Tabs (Diazepam) .Marland Kitchen.. 1 At Bedtime As Needed For Ringing in The Ears 10)  Omega 3 Plus Vitamins .... 2 Tablets Daily By Mouth 11)  Alphasorb- C 500mg  .... 1 Daily By Mouth 12)  Joint Musle Vitamin .Marland Kitchen.. 1 Daily By Mouth 13)  Novolog Penfill 100 Unit/ml Soln (Insulin Aspart) .Marland Kitchen.. 10 Units Three Times A Day 14)  Nexium 40 Mg Cpdr (Esomeprazole Magnesium) .... One Tab By Mouth 45 Mins Before Supper 15)  Accu-Chek Aviva  Strp (Glucose Blood) .... Use 1 Strip Twice A Day 16)  Carvedilol 25 Mg Tabs (Carvedilol) .... Take One Tablet By Mouth Every Morning  and A Half A Tablet Every Evening 17)  Klor-Con M20 20 Meq Cr-Tabs (Potassium Chloride Crys Cr) .... 2 By Mouth Daily 18)  Aspirin 81 Mg Tbec (Aspirin) .... Take One Tablet By Mouth Two Times A Day 19)  Lisinopril 20 Mg Tabs (Lisinopril) .... One Tablet By Mouth Daily  Allergies: No Known Drug Allergies  Physical Exam  General:  Well-developed,well-nourished,in no acute distress; alert,appropriate and cooperative throughout examination, mildly obese. Head:  Normocephalic and atraumatic without obvious abnormalities. No apparent alopecia or balding. Eyes:  Conjunctiva clear bilaterally.  Lungs:  Normal respiratory effort, chest expands symmetrically. Lungs are clear to auscultation, no crackles or wheezes. Heart:  Normal rate and regular rhythm. S1 and S2 normal without gallop, murmur, click, rub or other extra sounds. Extremities:  Bandage over distal left arm/ prox wrist from dogbite the pt says is healing ok.    Impression & Recommendations:  Problem # 1:  DIABETES MELLITUS, TYPE I, ADULT ONSET (ICD-250.01) Assessment Unchanged A1C today 7.7 which correlates with sugars reported altho may be old. Increase Levemir to28 units and continue Novolog at 14 units only in the evening. (not what had been prescribed)  His updated medication list for this problem includes:    Levemir Flexpen Soln (Insulin detemir soln) .Marland Kitchen... 28 units in the evening.    Metformin Hcl 1000 Mg Tabs (Metformin hcl) .Marland Kitchen... 1  tab by mouth two times a day by mouth    Novolog Penfill 100 Unit/ml Soln (Insulin aspart) .Marland Kitchen... 14 units every evening    Aspirin 81 Mg Tbec (Aspirin) .Marland Kitchen... Take one tablet by mouth two times a day    Lisinopril 20 Mg Tabs (Lisinopril) ..... One tablet by mouth daily  Labs Reviewed: Creat: 1.4 (06/05/2009)   Microalbumin: 40.8 (09/24/2005)  Last Eye Exam: normal (12/19/2008) Reviewed HgBA1c results: 7.7 (08/06/2009)  7.3 (03/06/2009)  Problem # 2:  HYPERTENSION (ICD-401.9) Assessment:  Deteriorated Will follow. His updated medication list for this problem includes:    Lasix 40 Mg Tabs (Furosemide) .Marland Kitchen... Take 1 tablet by mouth once a day    Spironolactone 25 Mg Tabs (Spironolactone) .Marland Kitchen... 1/2  daily by mouth    Carvedilol 25 Mg Tabs (Carvedilol) .Marland Kitchen... Take one tablet by mouth every morning and a half a tablet every evening    Lisinopril 20 Mg Tabs (Lisinopril) ..... One tablet by mouth daily  BP today: 150/86 Prior BP: 138/64 (08/06/2009)  Labs Reviewed: K+: 4.7 (06/05/2009) Creat: : 1.4 (06/05/2009)   Chol: 258 (02/21/2008)   HDL: 41.6 (02/21/2008)   LDL: DEL (02/21/2008)   TG: 199 (02/21/2008)  Problem # 3:  DIZZINESS (ICD-780.4) Assessment: New  Wife reported on the phone, pt did not mention in the  office. Told her to have him check his sugar when the dizziness happens. Will give instant answer to whether glucose is the culprit or not. Coukld be vertigo if happens repeatedly. Doubt cardiac.  Complete Medication List: 1)  Plavix 75 Mg Tabs (Clopidogrel bisulfate) .... Take 1 tablet by mouth once a day 2)  Lasix 40 Mg Tabs (Furosemide) .... Take 1 tablet by mouth once a day 3)  Levemir Flexpen Soln (Insulin detemir soln) .... 28 units in the evening. 4)  Crestor 20 Mg Tabs (Rosuvastatin calcium) .... One tablet by mouth at bedtime 5)  Metformin Hcl 1000 Mg Tabs (Metformin hcl) .Marland Kitchen.. 1  tab by mouth two times a day by mouth 6)  Spironolactone 25 Mg Tabs (Spironolactone) .... 1/2  daily by mouth 7)  Cinnamon 500 Mg Caps (Cinnamon) .... 2 tablets daily by mouth 8)  Ambien 10 Mg Tabs (Zolpidem tartrate) .... One tab by mouth bedtime as needed for insomnia sparingly. 9)  Diazepam 10 Mg Tabs (Diazepam) .Marland Kitchen.. 1 at bedtime as needed for ringing in the ears 10)  Omega 3 Plus Vitamins  .... 2 tablets daily by mouth 11)  Alphasorb- C 500mg   .... 1 daily by mouth 12)  Joint Musle Vitamin  .Marland Kitchen.. 1 daily by mouth 13)  Novolog Penfill 100 Unit/ml Soln (Insulin aspart) .Marland Kitchen.. 14 units  every evening 14)  Nexium 40 Mg Cpdr (Esomeprazole magnesium) .... One tab by mouth 45 mins before supper 15)  Accu-chek Aviva Strp (Glucose blood) .... Use 1 strip twice a day 16)  Carvedilol 25 Mg Tabs (Carvedilol) .... Take one tablet by mouth every morning and a half a tablet every evening 17)  Klor-con M20 20 Meq Cr-tabs (Potassium chloride crys cr) .... 2 by mouth daily 18)  Aspirin 81 Mg Tbec (Aspirin) .... Take one tablet by mouth two times a day 19)  Lisinopril 20 Mg Tabs (Lisinopril) .... One tablet by mouth daily  Patient Instructions: 1)  RTC 8/11 for followup of DM,  A1C prior.  2)  Bring diary of nos along. 3)  Increase Levemir to 28 and cont Novolog once of 14 units at night. 4)  Pt given written script for glucometer strips for [facility].  Current Allergies (reviewed today): No known allergies

## 2010-05-07 NOTE — Letter (Signed)
Summary: [facility] Plan of Care/Initial Evaluation  [facility] Plan of Care/Initial Evaluation   Imported By: Beau Fanny 07/24/2009 16:39:37  _____________________________________________________________________  External Attachment:    Type:   Image     Comment:   External Document

## 2010-05-07 NOTE — Medication Information (Signed)
Summary: [facility] Order  [facility] Order   Imported By: Beau Fanny 07/25/2009 13:53:51  _____________________________________________________________________  External Attachment:    Type:   Image     Comment:   External Document

## 2010-05-07 NOTE — Progress Notes (Signed)
Summary: Rx Ambien & Diazepam  Phone Note Refill Request Call back at [phone] Message from:  [facility] on July 18, 2009 11:14 AM  Refills Requested: Medication #1:  DIAZEPAM 10 MG  TABS 1 AT BEDTIME AS NEEDED FOR RINGING IN THE EARS   Last Refilled: 03/13/2009  Medication #2:  AMBIEN 10 MG  TABS one tab by mouth bedtime as needed for insomnia sparingly.   Last Refilled: 03/14/2009 Received faxed refill request please advise.     Method Requested: Telephone to Pharmacy Initial call taken by: Linde Gillis CMA Duncan Dull),  July 18, 2009 11:15 AM  Follow-up for Phone Call        Please insure the pt knows not to take these pills the same nite. Follow-up by: Shaune Leeks MD,  July 18, 2009 1:39 PM  Additional Follow-up for Phone Call Additional follow up Details #1::        Left message on machine at home advising patient as instructed. Additional Follow-up by: Linde Gillis CMA Duncan Dull),  July 18, 2009 2:04 PM

## 2010-05-07 NOTE — Medication Information (Signed)
Summary: Physician's Order  Physician's Order   Imported By: Roderic Ovens 09/24/2009 15:00:41  _____________________________________________________________________  External Attachment:    Type:   Image     Comment:   External Document

## 2010-05-07 NOTE — Progress Notes (Signed)
Summary: c/o dizziness, gain wt  Phone Note Call from Patient Call back at Home Phone [phone] Call back at [phone]   Caller: Spouse- susan  Reason for Call: Talk to Nurse Summary of Call: pt has appt on 4/29. c/o dizziness, gain couple of lbs.  Initial call taken by: Lorne Skeens,  July 16, 2009 12:10 PM  Follow-up for Phone Call        left message for [name] to call back on cell #, called home # and spoke w/pt he stated who had called and he didn't know what was wrong w/her.  Pt states he is ok he gets dizzy only occ. and his wt is only up 1-2 lbs.  No SOB or edema, pt states he feels ok.  Advised if dizziness worsened or wt cont. going up to call office back, otherwise keep appt sch for 4/29 Meredith Staggers, RN  July 16, 2009 12:24 PM      Appended Document: c/o dizziness, gain wt wife called back and stated she didn't know if she should be concerned or not, explained wt gain of a lb or 2 is ok as long it doesn't cont. to gradually increase and he feels ok.  She ask about dizziness she states he doesn't tell her much but she noticed he has been getting dizzy sometimes when he stands advised BP could be dropping a little w/postition changes to monior and check BP, keep appt w/Dr Juanda Chance 4/29 and call back before if needed

## 2010-05-07 NOTE — Progress Notes (Signed)
Summary: paperwork for cardiac rehab at [facility]  Phone Note Call from Patient Call back at Home Phone [phone]   Caller: Spouse [phone] Reason for Call: Talk to Nurse Summary of Call: per pt wife calling,. pt has appt today @ 1:45. pls send paperwork through for cardiac rehab @ [place].  Initial call taken by: Lorne Skeens,  February 07, 2010 12:04 PM  Follow-up for Phone Call        I left a message for cardiac rehab at [facility] to call me. I am unclear as to what the pt's wife is referring to. Sherri Rad, RN, BSN  February 07, 2010 2:55 PM  I left a message for cardiac rehab at [facility] to call me. Sherri Rad, RN, BSN  February 11, 2010 2:21 PM   I received a call from Cardiac Rehab at [facility]. They are unsure of what the pt is wanting unless it is for the maintenance phase of Cardiac Rehab. [facility] will call the pt's wife. Follow-up by: Sherri Rad, RN, BSN,  February 12, 2010 11:04 AM

## 2010-05-07 NOTE — Progress Notes (Signed)
Summary: regarding diet pill  Phone Note Call from Patient Call back at Home Phone [phone]   Caller: Patient Call For: Dr. Para March Summary of Call: Pt bought an otc diet pill, C-mukul, and he is asking if he can take this.  Do you need to see the label first before advising. Initial call taken by: Lowella Petties CMA,  January 21, 2010 2:13 PM  Follow-up for Phone Call        I am not aware of any over the counter diet pill that is safe and has long term data to support its use.  I cannot verify the ingredients of the pill even if I saw the label- if it's OTC and therefore not regulated by the FDA, then there is no certainty about the contents.  I will defer to the patient, but I wouldn't encourage use of it.  Follow-up by: Crawford Givens MD,  January 21, 2010 2:42 PM  Additional Follow-up for Phone Call Additional follow up Details #1::        Left message on machine for patient to call back. Sydell Axon LPN  January 21, 2010 3:14 PM  Advised pt.  He may bring the bottle by sometime for Dr Para March to look at. Additional Follow-up by: Lowella Petties CMA,  January 21, 2010 3:46 PM

## 2010-05-07 NOTE — Progress Notes (Signed)
Summary: PT A LITLE SOB   Phone Note Call from Patient Call back at Home Phone [phone] Call back at [phone]   Caller: Patient Reason for Call: Talk to Nurse, Talk to Doctor Summary of Call: PT IS A LITTLE SOB AND WIFE WANTS TO KNOW IS TAHT SOMETHING SHE SHOULD BE CONCERNED ABOUT SINCE HE JUST HAD A HEART ATTACK Initial call taken by: Omer Jack,  May 18, 2009 12:31 PM  Follow-up for Phone Call        sob  - bad today. new since leaving the hospital, has been weighing himself daily with no change in wt, no edema, no cough - family questions if he needs O2 stating that they had some at the house and he used it and it helped.  Gets SOB walking thru the house.   [facility] coming back next week, will call [facility] to have them go out to check O2 Sat.  Pt to call the DOD this weekend if SOB worsens.  Discussed with family the importance of daily weights and strick compliance with diet by limiting NA intake.  Fm states understanding and is actively researching low NA foods and meal prep. Follow-up by: Charolotte Capuchin, RN,  May 18, 2009 3:31 PM  Additional Follow-up for Phone Call Additional follow up Details #1::        I left a message for the pt to call- trying to f/u with how he is feeling. Sherri Rad, RN, BSN  May 23, 2009 1:01 PM  I left a message for the pt to call. Sherri Rad, RN, BSN  May 24, 2009 3:58 PM

## 2010-05-07 NOTE — Progress Notes (Signed)
Summary: refill request for diazepam  Phone Note Refill Request Message from:  Fax from Pharmacy  Refills Requested: Medication #1:  DIAZEPAM 10 MG  TABS 1 AT BEDTIME AS NEEDED FOR RINGING IN THE EARS   Last Refilled: 07/18/2009 Faxed request from [facility], [phone].  Initial call taken by: Lowella Petties CMA,  October 24, 2009 4:05 PM  Follow-up for Phone Call        please refill, same sig, #30, 1rf.  thanks.  Follow-up by: Crawford Givens MD,  October 24, 2009 9:03 PM    Prescriptions: DIAZEPAM 10 MG  TABS (DIAZEPAM) 1 AT BEDTIME AS NEEDED FOR RINGING IN THE EARS  #30 x 1   Entered by:   Delilah Shan CMA (AAMA)   Authorized by:   Crawford Givens MD   Signed by:   Delilah Shan CMA (AAMA) on 10/25/2009   Method used:   Handwritten   RxID:   [id number]   Appended Document: refill request for diazepam Medication phoned to pharmacy.

## 2010-05-07 NOTE — Progress Notes (Signed)
Summary: would like phone call  Phone Note Call from Patient Call back at Home Phone [phone]   Caller: Patient Call For: Shaune Leeks MD Summary of Call: Patient is requesting phone call from you. I told him that you would not be in until monday to get the message and he said that would be okay. He says that it is not an emergency, he just has some insurance issues and wants to know if he needs to find a different specialist.  Initial call taken by: Melody Comas,  May 11, 2009 3:01 PM  Follow-up for Phone Call        Pt  currently at [facility]. He had a heart attack 2 days ago per his brother-in law.  Follow-up by: Shaune Leeks MD,  May 14, 2009 7:56 AM

## 2010-05-07 NOTE — Miscellaneous (Signed)
Summary: [facility]  [facility] Plan   Imported By: Roderic Ovens 09/26/2009 12:49:20  _____________________________________________________________________  External Attachment:    Type:   Image     Comment:   External Document

## 2010-05-07 NOTE — Miscellaneous (Signed)
Summary: [facility]  [facility] Orders   Imported By: Roderic Ovens 06/19/2009 12:15:31  _____________________________________________________________________  External Attachment:    Type:   Image     Comment:   External Document

## 2010-05-07 NOTE — Progress Notes (Signed)
Summary: [name] [name]  Phone Note Refill Request Message from:  Fax from Pharmacy on February 13, 2010 1:17 PM  Refills Requested: Medication #1:  AMBIEN 10 MG  TABS one tab by mouth bedtime as needed for insomnia sparingly.   Last Refilled: 12/04/2009 Refill request from [facility] pharmacy. [phone].   Initial call taken by: Melody Comas,  February 13, 2010 1:17 PM  Follow-up for Phone Call        Pt has appt with Dr Para March on the 14th. This prescription can be discussed then. Follow-up by: Shaune Leeks MD,  February 13, 2010 1:39 PM  Additional Follow-up for Phone Call Additional follow up Details #1::        I would fill it in the meantime.  please call in.  thanks.  Additional Follow-up by: Crawford Givens MD,  February 13, 2010 2:49 PM    Additional Follow-up for Phone Call Additional follow up Details #2::    Medication phoned to pharmacy.  Follow-up by: Delilah Shan CMA Duncan Dull),  February 13, 2010 4:09 PM  Prescriptions: AMBIEN 10 MG  TABS (ZOLPIDEM TARTRATE) one tab by mouth bedtime as needed for insomnia sparingly.  #30 x 0   Entered by:   Crawford Givens MD   Authorized by:   Shaune Leeks MD   Signed by:   Crawford Givens MD on 02/13/2010   Method used:   Telephoned to ...       [facility]* (retail)       [address]       [place], [place]  [address]       Ph: [phone]       Fax: [phone]   RxID:   [id number]

## 2010-05-07 NOTE — Assessment & Plan Note (Signed)
Summary: per check out   Visit Type:  Follow-up Primary Provider:  Shaune Leeks MD  CC:  No cardiac complaints.  History of Present Illness: The patient is 76 years old and return for management of CHF and CAD. He had bypass surgery in 2008. He has an ischemic cardiomyopathy with an ejection fraction of 30-35%. He was hospitalized in February of this year with congestive heart failure. According to the discharge summary his ejection fraction was 15-20% we see no echo documentation of that. He has had a single lead ICD implanted by Dr. Ladona Ridgel. His chronic systolic CHF.   He says been doing about the same. He gets short of breath with minimal to moderate exertion although the history is difficult. He's had no chest pain.  He is originally from [place] and worked there for many years until he retired a number of years ago.  Current Medications (verified): 1)  Plavix 75 Mg Tabs (Clopidogrel Bisulfate) .... Take 1 Tablet By Mouth Once A Day 2)  Lasix 40 Mg Tabs (Furosemide) .... Take 1 Tablet By Mouth Once A Day 3)  Levemir Flexpen  Soln (Insulin Detemir Soln) .... 20 Units Every Morning 4)  Crestor 20 Mg Tabs (Rosuvastatin Calcium) .... One Tablet By Mouth At Bedtime 5)  Metformin Hcl 1000 Mg Tabs (Metformin Hcl) .Marland Kitchen.. 1  Tab By Mouth Two Times A Day By Mouth 6)  Spironolactone 25 Mg  Tabs (Spironolactone) .... 1/2  Daily By Mouth 7)  Cinnamon 500 Mg  Caps (Cinnamon) .... 2 Tablets Daily By Mouth 8)  Ambien 10 Mg  Tabs (Zolpidem Tartrate) .... One Tab By Mouth Bedtime As Needed For Insomnia Sparingly. 9)  Omega 3 Plus Vitamins .... 2 Tablets Daily By Mouth 10)  Novolog Penfill 100 Unit/ml Soln (Insulin Aspart) .... Sliding Scale - Three Times A Day 11)  Accu-Chek Aviva  Strp (Glucose Blood) .... Use 1 Strip Twice A Day 12)  Carvedilol 25 Mg Tabs (Carvedilol) .... Take 1 and 1/2 Tab Two Times A Day 13)  Klor-Con M20 20 Meq Cr-Tabs (Potassium Chloride Crys Cr) .... 2 By Mouth Daily 14)   Aspirin 81 Mg Tbec (Aspirin) .... Take One Tablet By Mouth Two Times A Day 15)  Lisinopril 10 Mg Tabs (Lisinopril) .... One Tablet By Mouth Daily  Allergies (verified): No Known Drug Allergies  Past History:  Past Medical History: Reviewed history from 06/04/2008 and no changes required. COPD: 1993 Diabetes mellitus, type II: 2000 Hyperlipidemia: 1993 Hypertension:1993 CHF Coronary artery disease status post coronary bypass graft surgery     in February 2008  Ischemic cardiomyopathy with ejection fraction of 30-35%. Class II systolic heart failure. S/P Single chamber ICD 2010  Review of Systems       ROS is negative except as outlined in HPI.   Vital Signs:  Patient profile:   76 year old male Height:      68 inches Weight:      249.25 pounds BMI:     38.04 Pulse rate:   78 / minute Pulse rhythm:   regular Resp:     18 per minute BP sitting:   154 / 79  (left arm)  Vitals Entered By: Vikki Ports (February 07, 2010 1:52 PM)  Physical Exam  Additional Exam:  Gen. Well-nourished, in no distress   Neck: No JVD, thyroid not enlarged, no carotid bruits Lungs: No tachypnea, clear without rales, rhonchi or wheezes Cardiovascular: Rhythm regular, PMI not displaced,  heart sounds  normal,  no murmurs or gallops, no peripheral edema, pulses normal in all 4 extremities. Abdomen: BS normal, abdomen soft and non-tender without masses or organomegaly, no hepatosplenomegaly. MS: No deformities, no cyanosis or clubbing   Neuro:  No focal sns   Skin:  no lesions    PPM Specifications Following MD:  Lewayne Bunting, MD     PPM Vendor:  Med[employer]onic     PPM Model Number:  [id number]     PPM Serial Number:  [id number] PPM DOI:  04/04/2008      Lead 1    Location: RV     DOI: 04/04/2008     Model #: [id number]     Serial #: [id number]     Status: active  ICD Specifications Following MD:  Lewayne Bunting, MD     ICD Vendor:  [employer]onic     ICD Model Number:  [id number]     ICD Serial Number:   [id number] ICD DOI:  04/04/2008      Lead 1:    Location: RV     DOI: 04/04/2008     Model #: 5400     Serial #: [id number]     Status: active  ICD Follow Up Remote Check?  No Battery Voltage:  3.17 V     Charge Time:  8.6 seconds     Underlying rhythm:  SR ICD Dependent:  No       ICD Device Measurements Right Ventricle:  Amplitude: 20 mV, Impedance: 494 ohms, Threshold: 1.0 V at 0.4 msec Shock Impedance: 48/58 ohms   Episodes Coumadin:  No Shock:  0     ATP:  0     Nonsustained:  0     Ventricular Pacing:  <0.1%  Brady Parameters Mode VVI     Lower Rate Limit:  40      Tachy Zones VF:  >207     VT:  207 (VIA VF)     VT1:  176     Next Cardiology Appt Due:  05/08/2010 Tech Comments:  No parameter changes.  Device function normal. ROV 3 months [place] [facility]. Altha Harm, LPN  February 07, 2010 2:16 PM   Impression & Recommendations:  Problem # 1:  SYSTOLIC HEART FAILURE, CHRONIC (ICD-428.22)  He has chronic systolic CHF. There is confusion about his ejection fraction. We think his last ejection fraction was 35-40%. He is probably class 2-3. He appears euvolemic today. We will repeat his echocardiogram. He has left bundle branch block and we may want to give consideration to upgrading his pacemaker to a biventricular pacemaker, especially if his EF is down. His updated medication list for this problem includes:    Plavix 75 Mg Tabs (Clopidogrel bisulfate) .Marland Kitchen... Take 1 tablet by mouth once a day    Lasix 40 Mg Tabs (Furosemide) .Marland Kitchen... Take 1 tablet by mouth once a day    Spironolactone 25 Mg Tabs (Spironolactone) .Marland Kitchen... 1/2  daily by mouth    Carvedilol 25 Mg Tabs (Carvedilol) .Marland Kitchen... Take 1 and 1/2 tab two times a day    Aspirin 81 Mg Tbec (Aspirin) .Marland Kitchen... Take one tablet by mouth two times a day    Lisinopril 20 Mg Tabs (Lisinopril) .Marland Kitchen... Take one tablet by mouth daily  Orders: Echocardiogram (Echo) TLB-BMP (Basic Metabolic Panel-BMET) (80048-METABOL) TLB-BNP (B-Natriuretic  Peptide) (83880-BNPR) TLB-CBC Platelet - w/Differential (85025-CBCD)  Problem # 2:  CAD, AUTOLOGOUS BYPASS GRAFT (ICD-414.02)  He had bypass surgery in 2008. He has had no chest pain and this  appears stable. His updated medication list for this problem includes:    Plavix 75 Mg Tabs (Clopidogrel bisulfate) .Marland Kitchen... Take 1 tablet by mouth once a day    Carvedilol 25 Mg Tabs (Carvedilol) .Marland Kitchen... Take 1 and 1/2 tab two times a day    Aspirin 81 Mg Tbec (Aspirin) .Marland Kitchen... Take one tablet by mouth two times a day    Lisinopril 20 Mg Tabs (Lisinopril) .Marland Kitchen... Take one tablet by mouth daily  Orders: Echocardiogram (Echo) TLB-BMP (Basic Metabolic Panel-BMET) (80048-METABOL) TLB-BNP (B-Natriuretic Peptide) (83880-BNPR) TLB-CBC Platelet - w/Differential (85025-CBCD)  Problem # 3:  HYPERTENSION (ICD-401.9) His blood pressure is somewhat elevated today. We will increase his lisinopril from 10-20 mg a day and repeat a BMP in one week. His updated medication list for this problem includes:    Lasix 40 Mg Tabs (Furosemide) .Marland Kitchen... Take 1 tablet by mouth once a day    Spironolactone 25 Mg Tabs (Spironolactone) .Marland Kitchen... 1/2  daily by mouth    Carvedilol 25 Mg Tabs (Carvedilol) .Marland Kitchen... Take 1 and 1/2 tab two times a day    Aspirin 81 Mg Tbec (Aspirin) .Marland Kitchen... Take one tablet by mouth two times a day    Lisinopril 20 Mg Tabs (Lisinopril) .Marland Kitchen... Take one tablet by mouth daily  His updated medication list for this problem includes:    Lasix 40 Mg Tabs (Furosemide) .Marland Kitchen... Take 1 tablet by mouth once a day    Spironolactone 25 Mg Tabs (Spironolactone) .Marland Kitchen... 1/2  daily by mouth    Carvedilol 25 Mg Tabs (Carvedilol) .Marland Kitchen... Take 1 and 1/2 tab two times a day    Aspirin 81 Mg Tbec (Aspirin) .Marland Kitchen... Take one tablet by mouth two times a day  Patient Instructions: 1)  You will need a device check in 3 months with Gunnar Fusi in the [place] office. 2)  Your physician recommends that you schedule a follow-up appointment in: 4 months with  Dr. Ladona Ridgel. 3)  Your physician has requested that you have an echocardiogram.  Echocardiography is a painless test that uses sound waves to create images of your heart. It provides your doctor with information about the size and shape of your heart and how well your heart's chambers and valves are working.  This procedure takes approximately one hour. There are no restrictions for this procedure. 4)  Labwork today: bmet/cbc/bnp (428.22;414.01) 5)  Labwork in 1 week: bmet 6)  Increase lisinopril to 20mg  once daily.

## 2010-05-09 NOTE — Assessment & Plan Note (Signed)
Summary: rov   Visit Type:  Follow-up Primary Provider:  Shaune Leeks MD  CC:  "Doing well"..  History of Present Illness: [name] returns today for ICD followup.  He is a pleasant 76 yo man with an ICM, VT, and CHF.  He is s/p ICD.  He denies any intercurrent ICD shocks.  No c/p or peripheral edema.  He has been bothered by his inability to lose weight. He has recently married! He does note that his CHF symptoms are worsened slightly as he is now class 2B. He still drinks one to 3 ETOH drinks a day.     Current Medications (verified): 1)  Plavix 75 Mg Tabs (Clopidogrel Bisulfate) .... Take 1 Tablet By Mouth Once A Day 2)  Lasix 40 Mg Tabs (Furosemide) .... Take 1 Tablet By Mouth Once A Day 3)  Levemir Flexpen  Soln (Insulin Detemir Soln) .... 20 Units Every Morning 4)  Crestor 20 Mg Tabs (Rosuvastatin Calcium) .... One Tablet By Mouth At Bedtime 5)  Metformin Hcl 1000 Mg Tabs (Metformin Hcl) .Marland Kitchen.. 1  Tab By Mouth Two Times A Day By Mouth 6)  Spironolactone 25 Mg  Tabs (Spironolactone) .... 1/2  Daily By Mouth 7)  Cinnamon 500 Mg  Caps (Cinnamon) .... 2 Tablets Daily By Mouth 8)  Ambien 10 Mg  Tabs (Zolpidem Tartrate) .... One Tab By Mouth Bedtime As Needed For Insomnia Sparingly. 9)  Omega 3 Plus Vitamins .... 2 Tablets Daily By Mouth 10)  Novolog Penfill 100 Unit/ml Soln (Insulin Aspart) .... Sliding Scale - Three Times A Day 11)  Accu-Chek Aviva  Strp (Glucose Blood) .... Use 1 Strip Twice A Day 12)  Carvedilol 25 Mg Tabs (Carvedilol) .... Take 1 and 1/2 Tab Two Times A Day 13)  Klor-Con M20 20 Meq Cr-Tabs (Potassium Chloride Crys Cr) .... 2 By Mouth Daily 14)  Aspirin 81 Mg Tbec (Aspirin) .... Take One Tablet By Mouth Two Times A Day 15)  Lisinopril 20 Mg Tabs (Lisinopril) .... Take One Tablet By Mouth Daily  Allergies (verified): No Known Drug Allergies  Past History:  Past Medical History: Last updated: 06/04/2008 COPD: 1993 Diabetes mellitus, type II:  2000 Hyperlipidemia: 1993 Hypertension:1993 CHF Coronary artery disease status post coronary bypass graft surgery     in February 2008  Ischemic cardiomyopathy with ejection fraction of 30-35%. Class II systolic heart failure. S/P Single chamber ICD 2010  Past Surgical History: Last updated: 05/17/2009 Broken bones L foot 1953 ETT nml Moryati 2000 [facility] ADMIT: 3 VESSEL CABG, CLASS IV CHF,NON Q WAVE MI: 05/19/2006 02/7-15/2008[facility] ADMIT CHF MI [facility] POSTOP ANEMIA POSTOP OVERLOAD  NEUROLOGY EVALUATION, REFUSED TESTING X 2 WEEKS HOSP Acute on Chronic CHF  Med Nonadherence  2/5-05/16/2009  Risk Factors: Alcohol Use: <1 (03/12/2009) Caffeine Use: 1 every other day (03/12/2009) Exercise: yes (03/12/2009)  Risk Factors: Smoking Status: quit (03/12/2009) Cigars/wk: occas, every other day. (03/12/2009) Passive Smoke Exposure: no (03/12/2009)  Review of Systems       The patient complains of weight gain and dyspnea on exertion.  The patient denies chest pain, syncope, and peripheral edema.    Vital Signs:  Patient profile:   76 year old male Height:      68 inches Weight:      250 pounds BMI:     38.15 Pulse rate:   81 / minute BP sitting:   146 / 90  (left arm) Cuff size:   large  Vitals Entered By: Lysbeth Galas CMA (March 18, 2010 12:20 PM)  Physical Exam  General:  GEN: nad, alert and oriented HEENT: mucous membranes moist NECK: supple w/o LA CV: rrr.  pacer palpated on L chest wall. sternotomy scar healed. PULM: ctab, no inc wob ABD: soft, +bs EXT: no edema, 1+ DP pulses SKIN: no acute rash  L knee mildly tender to palpation medially but o/w w/o acute change noted.   PPM Specifications Following MD:  Lewayne Bunting, MD     PPM Vendor:  [employer]onic     PPM Model Number:  [id number]     PPM Serial Number:  [id number] PPM DOI:  04/04/2008      Lead 1    Location: RV     DOI: 04/04/2008     Model #: 4098     Serial #: [id number]     Status: active  ICD  Specifications Following MD:  Lewayne Bunting, MD     ICD Vendor:  [employer]onic     ICD Model Number:  [id number]     ICD Serial Number:  [id number] ICD DOI:  04/04/2008      Lead 1:    Location: RV     DOI: 04/04/2008     Model #: 7846     Serial #: [id number]     Status: active  ICD Follow Up Remote Check?  No Battery Voltage:  3.17 V     Charge Time:  8.6 seconds     Underlying rhythm:  SR ICD Dependent:  No       ICD Device Measurements Right Ventricle:  Amplitude: 20 mV, Impedance: 494 ohms, Threshold: 1.0 V at 0.4 msec Shock Impedance: 46/46 ohms   Episodes Coumadin:  No Shock:  0     ATP:  0     Nonsustained:  0     Ventricular Pacing:  <0.1%  Brady Parameters Mode VVI     Lower Rate Limit:  40      Tachy Zones VF:  >207     VT:  207 (VIA VF)     VT1:  176     Next Cardiology Appt Due:  06/06/2010 Tech Comments:  No parameter changes.  Device function normal.  ROV 3 months [place] [facility]. Altha Harm, LPN  March 18, 2010 12:25 PM  MD Comments:  Agree with above.  Impression & Recommendations:  Problem # 1:  AUTOMATIC IMPLANTABLE CARDIAC DEFIBRILLATOR SITU (ICD-V45.02) His device is working normally.  will follow.  Problem # 2:  SYSTOLIC HEART FAILURE, CHRONIC (ICD-428.22) His symptoms are a bit worse.  He has a QRS of 190 ms and may ultimately require BiV ICD upgrade.  I have asked him to reduce his sodium intake and stop drinking ETOH.  He will try and become more active. His updated medication list for this problem includes:    Plavix 75 Mg Tabs (Clopidogrel bisulfate) .Marland Kitchen... Take 1 tablet by mouth once a day    Lasix 40 Mg Tabs (Furosemide) .Marland Kitchen... Take 1 tablet by mouth once a day    Spironolactone 25 Mg Tabs (Spironolactone) .Marland Kitchen... 1/2  daily by mouth    Carvedilol 25 Mg Tabs (Carvedilol) .Marland Kitchen... Take 1 and 1/2 tab two times a day    Aspirin 81 Mg Tbec (Aspirin) .Marland Kitchen... Take one tablet by mouth two times a day    Lisinopril 20 Mg Tabs (Lisinopril) .Marland Kitchen... Take one tablet by  mouth daily  Problem # 3:  CAD, AUTOLOGOUS BYPASS GRAFT (ICD-414.02) He denies anginal symptoms.  will follow.  His updated medication list for this problem includes:    Plavix 75 Mg Tabs (Clopidogrel bisulfate) .Marland Kitchen... Take 1 tablet by mouth once a day    Carvedilol 25 Mg Tabs (Carvedilol) .Marland Kitchen... Take 1 and 1/2 tab two times a day    Aspirin 81 Mg Tbec (Aspirin) .Marland Kitchen... Take one tablet by mouth two times a day    Lisinopril 20 Mg Tabs (Lisinopril) .Marland Kitchen... Take one tablet by mouth daily  Patient Instructions: 1)  Your physician recommends that you schedule a follow-up appointment in: 6 months 2)  Your physician recommends that you continue on your current medications as directed. Please refer to the Current Medication list given to you today.

## 2010-05-09 NOTE — Progress Notes (Signed)
Summary: [name] [name]  Phone Note Refill Request Message from:  Fax from Pharmacy on May 01, 2010 3:08 PM  Refills Requested: Medication #1:  AMBIEN 10 MG  TABS one tab by mouth bedtime as needed for insomnia sparingly.   Last Refilled: 02/13/2010 Refill request from [facility] pharmacy. [phone].   Initial call taken by: Melody Comas,  May 01, 2010 3:10 PM  Follow-up for Phone Call        I think this pt connsifders Dr Para March his doctor now. Will forward this request to him. Follow-up by: Shaune Leeks MD,  May 01, 2010 4:11 PM  Additional Follow-up for Phone Call Additional follow up Details #1::        please call in.  Crawford Givens MD  May 01, 2010 5:27 PM   Medication phoned to pharmacy.  Additional Follow-up by: Delilah Shan CMA (AAMA),  May 02, 2010 12:04 PM    Prescriptions: AMBIEN 10 MG  TABS (ZOLPIDEM TARTRATE) one tab by mouth bedtime as needed for insomnia sparingly.  #30 x 1   Entered by:   Crawford Givens MD   Authorized by:   Shaune Leeks MD   Signed by:   Crawford Givens MD on 05/01/2010   Method used:   Telephoned to ...       [facility]* (retail)       [address]       [place], [place]  [address]       Ph: [phone]       Fax: [phone]   RxID:   [id number]

## 2010-05-09 NOTE — Medication Information (Signed)
Summary: Lab Orders  Lab Orders   Imported By: Marylou Mccoy 03/28/2010 14:57:38  _____________________________________________________________________  External Attachment:    Type:   Image     Comment:   External Document

## 2010-05-09 NOTE — Letter (Signed)
Summary: [facility] Pharmacist, community Exercise Program  [facility] - Chartered certified accountant Exercise Program   Imported By: Marylou Mccoy 04/04/2010 16:12:43  _____________________________________________________________________  External Attachment:    Type:   Image     Comment:   External Document

## 2010-05-09 NOTE — Assessment & Plan Note (Signed)
Summary: dizzy/alc   Vital Signs:  Patient profile:   76 year old male Height:      68 inches Weight:      259 pounds BMI:     39.52 Temp:     98.1 degrees F oral Pulse rate:   80 / minute Pulse rhythm:   regular BP supine:   128 / 70  (left arm) BP sitting:   130 / 72  (left arm) Cuff size:   large  Vitals Entered By: Delilah Shan CMA [name] [name]) (April 25, 2010 12:07 PM) CC: Dizzy   History of Present Illness: "I had a dizzy spell."   Noted with position change, from bending over and then standing.  1st episode was years ago.  It has been going on for years intermittently . An episode may last for about 1 hour or longer.  Had checked BP and this was controlled.  He'll usually list to the left with an episode.  No CP.  He'll get pale with an episode, his wife notes.  No other focal neuro changes.  No syncope.    He does have h/o tinnitis and noise exposure.  He had prev ENT eval in [place] and work up at [facility] per his report several years ago, but I don't have records to review.   He has cut down on drinking, 1 beer last week.  Still drinking 1 bottle of wine a night.     Allergies: No Known Drug Allergies  Past History:  Past Medical History: Last updated: 06/04/2008 COPD: 1993 Diabetes mellitus, type II: 2000 Hyperlipidemia: 1993 Hypertension:1993 CHF Coronary artery disease status post coronary bypass graft surgery     in February 2008  Ischemic cardiomyopathy with ejection fraction of 30-35%. Class II systolic heart failure. S/P Single chamber ICD 2010  Past Surgical History: Broken bones L foot 1953 ETT nml Moryati 2000 [facility] ADMIT: 3 VESSEL CABG, CLASS IV CHF,NON Q WAVE MI: 05/19/2006 [facility]/2008[facility] ADMIT CHF MI [facility] POSTOP ANEMIA POSTOP OVERLOAD  NEUROLOGY EVALUATION, REFUSED TESTING HOSP Acute on Chronic CHF  Med Nonadherence  2/5-05/16/2009  Social History: Reviewed history from 11/13/2009 and no changes required. 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  occ tob, cigar, prev increase tobacco use likes to build model airplanes, likes fishing  Review of Systems       See HPI.  Otherwise negative.    Physical Exam  General:  GEN: nad, alert and oriented HEENT: mucous membranes moist, tm w/o erythema or SOM bilaterally NECK: supple w/o LA CV: rrr.  pacer palpated on L chest wall. sternotomy scar healed. PULM: ctab, no inc wob ABD: soft, +bs EXT: no edema, 1+ DP pulses SKIN: no acute rash  CN 2-12 wnl B, S/S/DTR wnl x4, DHP wnl bilaterally   Impression & Recommendations:  Problem # 1:  DIZZINESS (ICD-780.4) Unclear source.  I will d/w cards and then contact the patient with options.   This doesn't appear to be cardiac related/BP related, but I would like input. No symptoms today and since symptoms have been going on for years, he is okay for outpatient follow up.  Advised to cut down on alcohol more.   Complete Medication List: 1)  Plavix 75 Mg Tabs (Clopidogrel bisulfate) .... Take 1 tablet by mouth once a day 2)  Lasix 40 Mg Tabs (Furosemide) .... Take 1 tablet by mouth once a day 3)  Levemir Flexpen Soln (Insulin detemir soln) .... 20 units every morning 4)  Crestor 20 Mg Tabs (Rosuvastatin calcium) .... One tablet by mouth at bedtime 5)  Metformin Hcl 1000 Mg Tabs (Metformin hcl) .Marland Kitchen.. 1  tab by mouth two times a day by mouth 6)  Spironolactone 25 Mg Tabs (Spironolactone) .... 1/2  daily by mouth 7)  Cinnamon 500 Mg Caps (Cinnamon) .... 2 tablets daily by mouth 8)  Ambien 10 Mg Tabs (Zolpidem tartrate) .... One tab by mouth bedtime as needed for insomnia sparingly. 9)  Omega 3 Plus Vitamins  .... 2 tablets daily by mouth 10)  Novolog Penfill 100 Unit/ml Soln (Insulin aspart) .... Sliding scale -  three times a day 11)  Accu-chek Aviva Strp (Glucose blood) .... Use 1 strip twice a day 12)  Carvedilol 25 Mg Tabs (Carvedilol) .... Take 1 and 1/2 tab two times a day 13)  Klor-con M20 20 Meq Cr-tabs (Potassium chloride crys cr) .... 2 by mouth daily 14)  Aspirin 81 Mg Tbec (Aspirin) .... Take one tablet by mouth two times a day 15)  Lisinopril 20 Mg Tabs (Lisinopril) .... Take one tablet by mouth daily  Patient Instructions: 1)  I'll talk to cardiology and will let you know if I have any suggestions in the meantime.  Don't change your meds for now.  If you notice other symptoms, let me know.  Take care.    Orders Added: 1)  Est. Patient Level IV [[address]]    Current Allergies (reviewed today): No known allergies

## 2010-05-09 NOTE — Cardiovascular Report (Signed)
Summary: Office Visit   Office Visit   Imported By: Roderic Ovens 03/27/2010 09:27:05  _____________________________________________________________________  External Attachment:    Type:   Image     Comment:   External Document

## 2010-05-09 NOTE — Progress Notes (Signed)
Summary: pt BP high when exercising  Phone Note From Other Clinic   Caller: fax from [facility] Summary of Call: Fax from [facility] shows that pt's BP has been high when he goes in to exercise.  Fax is on your desk.                Lowella Petties CMA, AAMA  May 03, 2010 3:30 PM   Follow-up for Phone Call        Please fax the info from rehab to Dr. Tenny Craw with cards.  I just talked to her.  Fax #[phone].  I asked for cards input on the symptoms that the patient had been having- see my last OV note.  Follow-up by: Crawford Givens MD,  May 03, 2010 4:03 PM  Additional Follow-up for Phone Call Additional follow up Details #1::        Faxed to Dr. Tenny Craw. Additional Follow-up by: Delilah Shan CMA Raji Glinski Dull),  May 03, 2010 4:15 PM    Additional Follow-up for Phone Call Additional follow up Details #2::    noted.  will await cards input.  Follow-up by: Crawford Givens MD,  May 03, 2010 4:21 PM

## 2010-05-10 ENCOUNTER — Encounter (INDEPENDENT_AMBULATORY_CARE_PROVIDER_SITE_OTHER): Payer: Self-pay | Admitting: *Deleted

## 2010-05-15 ENCOUNTER — Other Ambulatory Visit (INDEPENDENT_AMBULATORY_CARE_PROVIDER_SITE_OTHER): Payer: Medicare Other

## 2010-05-15 ENCOUNTER — Other Ambulatory Visit: Payer: Self-pay | Admitting: Family Medicine

## 2010-05-15 ENCOUNTER — Encounter (INDEPENDENT_AMBULATORY_CARE_PROVIDER_SITE_OTHER): Payer: Self-pay | Admitting: *Deleted

## 2010-05-15 DIAGNOSIS — E119 Type 2 diabetes mellitus without complications: Secondary | ICD-10-CM

## 2010-05-15 LAB — HEMOGLOBIN A1C: Hgb A1c MFr Bld: 8.4 % — ABNORMAL HIGH (ref 4.6–6.5)

## 2010-05-15 NOTE — Letter (Signed)
Summary: Generic Letter  [facility]  [address] [place], [place] [address]   Phone: [phone]  Fax: [phone]    05/10/2010    [name] [name] [address] [place], [place]  [address]   Dear [name],   We have been unable to reach you by phone.  If your phone number has changed, please notify our office as it is important that we be able to contact  you if necessary.  Cardiology does not think that your symptoms are related to your heart.  I would like for you to see a Neurologist.  Please let me know if you are willing to do that and we will get you set up.       Sincerely,   Delilah Shan CMA (AAMA)for Dwana Curd. Para March, M.D.  [facility]

## 2010-05-15 NOTE — Progress Notes (Signed)
  ----   Converted from flag ---- ---- 05/06/2010 9:31 AM, Laren Boom, MD, [facility] wrote: No mention. He has a h/o heavy ETOH abuse. He has an ICD. If not orthostatic, I would not suspect a cardiac source, specifically no brady or tachy as his device would have suggested this when we interogated it.  GT  ---- 04/25/2010 9:07 PM, Crawford Givens MD wrote: Did patient ever mention anything about profound vertigo (no syncope), present intermittently over the last few years?  He came to see me about it today.  I am not sure of the source but I wanted your input.  I could get him set up with neuro if you don't think cardiac source would be likely. He wasn't orthostatic today.  Thanks. ------------------------------

## 2010-05-15 NOTE — Progress Notes (Signed)
  Phone Note Outgoing Call   Summary of Call: Please call patient.  Per cards, the recent episodes he has been having are not likely to be cardiac in nature.  I would have him follow up with neuro.  If he allows the referral, I'll order it.  Thanks.  Initial call taken by: Crawford Givens MD,  May 06, 2010 1:44 PM  Follow-up for Phone Call        Left message on voicemail  to return call. Lugene Fuquay CMA Duncan Dull)  May 06, 2010 2:51 PM  Wife says he did have one of his spells again on Saturday.  He was not himself. She said he kept feeling like he was getting lost and was  talking about crazy things in the past.  It was almost as if he had that heart attack stare again.  She stayed with him all day and finally got him to eat that afternoon and he got better so she didn't know if it was his BS or not.  He wouldn't test his BS but he did take his shot.  She doesn't want him to know that she called.  He told her that he was going to call later today in response to my message but she says she doesn't know if he will or not.  They understood my message about seeing a Neurologist but she says he's pretty stubborn and she would love for him to do that but she doubts that he will.   He has a F/U appt. on the 15th.  She is in hopes you will talk to him at that time. Delilah Shan CMA Duncan Dull)  May 08, 2010 2:51 PM   Additional Follow-up for Phone Call Additional follow up Details #1::        I think the patient should see neuro.  If he has more symptoms that are concerning for a stroke/MI, then he needs to be at the hospital.  If he doesn't call back this afternoon, please try to call him again. Crawford Givens MD  May 08, 2010 2:58 PM   Tried to phone patient with no answer and no A/M. Lugene Fuquay CMA Duncan Dull)  May 08, 2010 4:49 PM     Additional Follow-up for Phone Call Additional follow up Details #2::    Left message on voicemail  to return call.  Lugene Fuquay CMA (AAMA)  May 10, 2010 9:10 AM  Letter mailed.  Follow-up by: Delilah Shan CMA (AAMA),  May 10, 2010 2:27 PM

## 2010-05-20 ENCOUNTER — Ambulatory Visit (INDEPENDENT_AMBULATORY_CARE_PROVIDER_SITE_OTHER): Payer: Medicare Other | Admitting: Family Medicine

## 2010-05-20 ENCOUNTER — Encounter: Payer: Self-pay | Admitting: Family Medicine

## 2010-05-20 DIAGNOSIS — R42 Dizziness and giddiness: Secondary | ICD-10-CM

## 2010-05-20 DIAGNOSIS — E119 Type 2 diabetes mellitus without complications: Secondary | ICD-10-CM

## 2010-05-20 LAB — HM DIABETES FOOT EXAM

## 2010-05-29 NOTE — Assessment & Plan Note (Signed)
Summary: 3 MONTH FOLLOW UP 30 MINUTES/[facility]   Vital Signs:  Patient profile:   76 year old male Height:      68 inches Weight:      253.75 pounds BMI:     38.72 Temp:     98.6 degrees F oral Pulse rate:   84 / minute Pulse rhythm:   regular BP sitting:   120 / 62  (left arm) Cuff size:   large  Vitals Entered By: Delilah Shan CMA Ladarrion Telfair Dull) (May 20, 2010 11:05 AM) CC: 3 months follow up - 30 minutes (Pt. brought meds but Lisinopril and Carvedilol is not included but he says he is taking it)   History of Present Illness: Diabetes:  Using medications without difficulties:yes but he has been skipping some novolog doses Hypoglycemic episodes:no, never under 100 Hyperglycemic episodes: see below Feet problems:sensation intact Blood Sugars averaging: 158 this AM, occ over 200 after meals.   eye exam within last year: follow up is pending, soon.  no change in vision per patient.    labs d/w patient.  A1c is increased.    He had been having some episodes of 'dizziness'. See prev notes.   We talked about this today.  I had sent a message to cards and this wasn't thought to be cardiac in origin.   He denies any return/episodes of these symptoms recently.   He admits to 1 glass of red wine daily.    Allergies: No Known Drug Allergies  Past History:  Past Medical History: Last updated: 06/04/2008 COPD: 1993 Diabetes mellitus, type II: 2000 Hyperlipidemia: 1993 Hypertension:1993 CHF Coronary artery disease status post coronary bypass graft surgery     in February 2008  Ischemic cardiomyopathy with ejection fraction of 30-35%. Class II systolic heart failure. S/P Single chamber ICD 2010  Review of Systems       See HPI.  Otherwise negative.    Physical Exam  General:  no apparent distress normocephalic atraumatic mucous membranes moist neck supple regular rate and rhythm clear to auscultation bilaterally pacer felt on chest wall ext w/o edema  Diabetes Management  Exam:    Foot Exam (with socks and/or shoes not present):       Sensory-Pinprick/Light touch:          Left medial foot (L-4): normal          Left dorsal foot (L-5): normal          Left lateral foot (S-1): normal          Right medial foot (L-4): normal          Right dorsal foot (L-5): normal          Right lateral foot (S-1): normal       Sensory-Monofilament:          Left foot: normal          Right foot: normal       Inspection:          Left foot: normal          Right foot: normal       Nails:          Left foot: too long          Right foot: too long   Impression & Recommendations:  Problem # 1:  DIABETES MELLITUS, TYPE II (ICD-250.00) >25 min spent with patient, at least half of which was spent on counseling ZO:XWRU.  We talked about losing weight and using  the novolog three times a day along with the levemir once daily. Pt to trim nails.  We talked about foot care. I have him a sample of novolog pen.  His updated medication list for this problem includes:    Levemir Flexpen Soln (Insulin detemir soln) .Marland Kitchen... 20 units every morning    Metformin Hcl 1000 Mg Tabs (Metformin hcl) .Marland Kitchen... 1  tab by mouth two times a day by mouth    Novolog Penfill 100 Unit/ml Soln (Insulin aspart) ..... Sliding scale - three times a day    Aspirin 81 Mg Tbec (Aspirin) .Marland Kitchen... Take one tablet by mouth two times a day    Lisinopril 20 Mg Tabs (Lisinopril) .Marland Kitchen... Take one tablet by mouth daily  Problem # 2:  DIZZINESS (ICD-780.4) Improved, if returns we should refer to Neuro.  He declined the referral today.  He'll let me know if symptoms return.    Complete Medication List: 1)  Plavix 75 Mg Tabs (Clopidogrel bisulfate) .... Take 1 tablet by mouth once a day 2)  Levemir Flexpen Soln (Insulin detemir soln) .... 20 units every morning 3)  Crestor 20 Mg Tabs (Rosuvastatin calcium) .... One tablet by mouth at bedtime 4)  Metformin Hcl 1000 Mg Tabs (Metformin hcl) .Marland Kitchen.. 1  tab by mouth two times a day  by mouth 5)  Spironolactone 25 Mg Tabs (Spironolactone) .... 1/2  daily by mouth 6)  Cinnamon 500 Mg Caps (Cinnamon) .... 2 tablets daily by mouth 7)  Ambien 10 Mg Tabs (Zolpidem tartrate) .... One tab by mouth bedtime as needed for insomnia sparingly. 8)  Omega 3 Plus Vitamins  .... 2 tablets daily by mouth 9)  Novolog Penfill 100 Unit/ml Soln (Insulin aspart) .... Sliding scale - three times a day 10)  Accu-chek Aviva Strp (Glucose blood) .... Use 1 strip twice a day 11)  Carvedilol 25 Mg Tabs (Carvedilol) .... Take 1 and 1/2 tab two times a day 12)  Klor-con M20 20 Meq Cr-tabs (Potassium chloride crys cr) .... 2 by mouth daily 13)  Aspirin 81 Mg Tbec (Aspirin) .... Take one tablet by mouth two times a day 14)  Lisinopril 20 Mg Tabs (Lisinopril) .... Take one tablet by mouth daily 15)  Grape Seed Extract 60 Mg Caps (Grape seed) .... Two times a day 16)  Ecko Sun  .... Two times a day  Patient Instructions: 1)  Don't change your meds now except for making sure to use the novolog three times a day.   2)  I would recheck you A1c in 3months- 250.00- with OV a few days later.  .   3)  Let me know if the dizzy sensation comes back.     Orders Added: 1)  Est. Patient Level IV [11914]    Current Allergies (reviewed today): No known allergies   Appended Document: 3 MONTH FOLLOW UP 30 MINUTES/[facility]    Clinical Lists Changes  Medications: Changed medication from NOVOLOG PENFILL 100 UNIT/ML SOLN (INSULIN ASPART) Sliding Scale - three times a day to NOVOLOG PENFILL 100 UNIT/ML SOLN (INSULIN ASPART) scale: sugar 100-150, 15 units. 150-200, 17 units. 200-250, 19 units. 250+, 21 units. use three times a day        Allergies: No Known Drug Allergies

## 2010-05-29 NOTE — Letter (Signed)
Summary: [facility]   [facility]   Imported By: Roderic Ovens 05/21/2010 08:43:04  _____________________________________________________________________  External Attachment:    Type:   Image     Comment:   External Document

## 2010-06-13 ENCOUNTER — Encounter (INDEPENDENT_AMBULATORY_CARE_PROVIDER_SITE_OTHER): Payer: Self-pay | Admitting: *Deleted

## 2010-06-18 NOTE — Letter (Signed)
Summary: Device-Delinquent Check  [facility], Main Office  [address]   [place], [place] [address]   Phone: [phone]  Fax: [phone]     June 13, 2010 MRN: [id number]   FRANCK VINAL [address] [place], [place]  [address]   Dear Mr. MARCHETTA,  According to our records, you have not had your implanted device checked in the recommended period of time.  We are unable to determine appropriate device function without checking your device on a regular basis.  Please call our office to schedule an appointment as soon as possible.  If you are having your device checked by another physician, please call us so that we may update our records.  Thank you,  Letta Moynahan, EMT  June 13, 2010 1:38 PM  [facility]

## 2010-06-26 LAB — CARDIAC PANEL(CRET KIN+CKTOT+MB+TROPI)
CK, MB: 13.6 ng/mL (ref 0.3–4.0)
CK, MB: 17.7 ng/mL (ref 0.3–4.0)
CK, MB: 18.8 ng/mL (ref 0.3–4.0)
Relative Index: 2.6 — ABNORMAL HIGH (ref 0.0–2.5)
Relative Index: 6.2 — ABNORMAL HIGH (ref 0.0–2.5)
Relative Index: 7.4 — ABNORMAL HIGH (ref 0.0–2.5)
Relative Index: 7.6 — ABNORMAL HIGH (ref 0.0–2.5)
Total CK: 179 U/L (ref 7–232)
Total CK: 240 U/L — ABNORMAL HIGH (ref 7–232)
Troponin I: 0.87 ng/mL (ref 0.00–0.06)
Troponin I: 1.62 ng/mL (ref 0.00–0.06)
Troponin I: 1.85 ng/mL (ref 0.00–0.06)

## 2010-06-26 LAB — URINALYSIS, ROUTINE W REFLEX MICROSCOPIC
Bilirubin Urine: NEGATIVE
Nitrite: NEGATIVE
Specific Gravity, Urine: 1.018 (ref 1.005–1.030)
Urobilinogen, UA: 0.2 mg/dL (ref 0.0–1.0)

## 2010-06-26 LAB — APTT: aPTT: 26 seconds (ref 24–37)

## 2010-06-26 LAB — GLUCOSE, CAPILLARY
Glucose-Capillary: 110 mg/dL — ABNORMAL HIGH (ref 70–99)
Glucose-Capillary: 127 mg/dL — ABNORMAL HIGH (ref 70–99)
Glucose-Capillary: 128 mg/dL — ABNORMAL HIGH (ref 70–99)
Glucose-Capillary: 164 mg/dL — ABNORMAL HIGH (ref 70–99)
Glucose-Capillary: 176 mg/dL — ABNORMAL HIGH (ref 70–99)
Glucose-Capillary: 177 mg/dL — ABNORMAL HIGH (ref 70–99)
Glucose-Capillary: 179 mg/dL — ABNORMAL HIGH (ref 70–99)
Glucose-Capillary: 199 mg/dL — ABNORMAL HIGH (ref 70–99)
Glucose-Capillary: 202 mg/dL — ABNORMAL HIGH (ref 70–99)
Glucose-Capillary: 219 mg/dL — ABNORMAL HIGH (ref 70–99)
Glucose-Capillary: 254 mg/dL — ABNORMAL HIGH (ref 70–99)

## 2010-06-26 LAB — CBC
Hemoglobin: 16.5 g/dL (ref 13.0–17.0)
MCV: 94.7 fL (ref 78.0–100.0)
Platelets: 143 10*3/uL — ABNORMAL LOW (ref 150–400)
Platelets: 150 10*3/uL (ref 150–400)
RBC: 4.26 MIL/uL (ref 4.22–5.81)
RBC: 4.98 MIL/uL (ref 4.22–5.81)
WBC: 10.6 10*3/uL — ABNORMAL HIGH (ref 4.0–10.5)
WBC: 13.9 10*3/uL — ABNORMAL HIGH (ref 4.0–10.5)
WBC: 8.8 10*3/uL (ref 4.0–10.5)

## 2010-06-26 LAB — POCT I-STAT 3, ART BLOOD GAS (G3+)
pCO2 arterial: 43.1 mmHg (ref 35.0–45.0)
pH, Arterial: 7.356 (ref 7.350–7.450)
pO2, Arterial: 65 mmHg — ABNORMAL LOW (ref 80.0–100.0)

## 2010-06-26 LAB — CALCIUM, IONIZED
Calcium, Ion: 1.21 mmol/L (ref 1.12–1.32)
Calcium, Ion: 1.23 mmol/L (ref 1.12–1.32)

## 2010-06-26 LAB — DIFFERENTIAL
Basophils Relative: 0 % (ref 0–1)
Eosinophils Absolute: 0 10*3/uL (ref 0.0–0.7)
Lymphocytes Relative: 16 % (ref 12–46)
Lymphocytes Relative: 17 % (ref 12–46)
Lymphs Abs: 0.7 10*3/uL (ref 0.7–4.0)
Lymphs Abs: 1.4 10*3/uL (ref 0.7–4.0)
Monocytes Absolute: 0.4 10*3/uL (ref 0.1–1.0)
Monocytes Absolute: 0.7 10*3/uL (ref 0.1–1.0)
Monocytes Relative: 4 % (ref 3–12)
Monocytes Relative: 5 % (ref 3–12)
Monocytes Relative: 9 % (ref 3–12)
Neutro Abs: 6.5 10*3/uL (ref 1.7–7.7)
Neutro Abs: 8.3 10*3/uL — ABNORMAL HIGH (ref 1.7–7.7)
Neutrophils Relative %: 74 % (ref 43–77)

## 2010-06-26 LAB — BASIC METABOLIC PANEL
BUN: 21 mg/dL (ref 6–23)
BUN: 22 mg/dL (ref 6–23)
CO2: 28 mEq/L (ref 19–32)
Calcium: 8.3 mg/dL — ABNORMAL LOW (ref 8.4–10.5)
Calcium: 9.3 mg/dL (ref 8.4–10.5)
Chloride: 100 mEq/L (ref 96–112)
Creatinine, Ser: 1.37 mg/dL (ref 0.4–1.5)
Creatinine, Ser: 1.42 mg/dL (ref 0.4–1.5)
GFR calc Af Amer: 51 mL/min — ABNORMAL LOW (ref >60)
GFR calc non Af Amer: 42 mL/min — ABNORMAL LOW (ref >60)
GFR calc non Af Amer: 51 mL/min — ABNORMAL LOW (ref >60)
Glucose, Bld: 121 mg/dL — ABNORMAL HIGH (ref 70–99)
Potassium: 3.6 mEq/L (ref 3.5–5.1)
Sodium: 139 mEq/L (ref 135–145)

## 2010-06-26 LAB — COMPREHENSIVE METABOLIC PANEL
AST: 42 U/L — ABNORMAL HIGH (ref 0–37)
Albumin: 3.3 g/dL — ABNORMAL LOW (ref 3.5–5.2)
Alkaline Phosphatase: 46 U/L (ref 39–117)
Chloride: 102 mEq/L (ref 96–112)
GFR calc Af Amer: 56 mL/min — ABNORMAL LOW (ref >60)
Potassium: 3.9 mEq/L (ref 3.5–5.1)
Sodium: 140 mEq/L (ref 135–145)
Total Bilirubin: 0.7 mg/dL (ref 0.3–1.2)
Total Protein: 6.3 g/dL (ref 6.0–8.3)

## 2010-06-26 LAB — HEMOGLOBIN A1C: Mean Plasma Glucose: 209 mg/dL

## 2010-06-26 LAB — HEPARIN LEVEL (UNFRACTIONATED): Heparin Unfractionated: 0.35 IU/mL (ref 0.30–0.70)

## 2010-06-26 LAB — MRSA PCR SCREENING: MRSA by PCR: NEGATIVE

## 2010-06-26 LAB — BRAIN NATRIURETIC PEPTIDE: Pro B Natriuretic peptide (BNP): 447 pg/mL — ABNORMAL HIGH (ref 0.0–100.0)

## 2010-06-26 LAB — CK TOTAL AND CKMB (NOT AT ARMC): Relative Index: 4 — ABNORMAL HIGH (ref 0.0–2.5)

## 2010-06-26 LAB — URINE MICROSCOPIC-ADD ON

## 2010-06-26 LAB — MAGNESIUM
Magnesium: 2.1 mg/dL (ref 1.5–2.5)
Magnesium: 2.2 mg/dL (ref 1.5–2.5)
Magnesium: 2.2 mg/dL (ref 1.5–2.5)

## 2010-06-26 LAB — LIPID PANEL: Triglycerides: 74 mg/dL (ref <150)

## 2010-06-26 LAB — POCT CARDIAC MARKERS: CKMB, poc: 7.3 ng/mL (ref 1.0–8.0)

## 2010-06-26 LAB — TROPONIN I: Troponin I: 0.26 ng/mL — ABNORMAL HIGH (ref 0.00–0.06)

## 2010-06-29 ENCOUNTER — Emergency Department (HOSPITAL_COMMUNITY): Payer: Medicare Other

## 2010-06-29 ENCOUNTER — Encounter: Payer: Self-pay | Admitting: Family Medicine

## 2010-06-29 ENCOUNTER — Telehealth: Payer: Self-pay | Admitting: Physician Assistant

## 2010-06-29 ENCOUNTER — Inpatient Hospital Stay (HOSPITAL_COMMUNITY): Payer: Medicare Other

## 2010-06-29 ENCOUNTER — Observation Stay (HOSPITAL_COMMUNITY)
Admission: EM | Admit: 2010-06-29 | Discharge: 2010-07-02 | DRG: 287 | Disposition: A | Discharge status: Home Health | Payer: Medicare Other | Attending: Internal Medicine | Admitting: Internal Medicine

## 2010-06-29 DIAGNOSIS — Z951 Presence of aortocoronary bypass graft: Secondary | ICD-10-CM | POA: Insufficient documentation

## 2010-06-29 DIAGNOSIS — I2589 Other forms of chronic ischemic heart disease: Secondary | ICD-10-CM | POA: Insufficient documentation

## 2010-06-29 DIAGNOSIS — R0609 Other forms of dyspnea: Secondary | ICD-10-CM | POA: Insufficient documentation

## 2010-06-29 DIAGNOSIS — I509 Heart failure, unspecified: Secondary | ICD-10-CM | POA: Insufficient documentation

## 2010-06-29 DIAGNOSIS — Z9119 Patient's noncompliance with other medical treatment and regimen: Secondary | ICD-10-CM | POA: Insufficient documentation

## 2010-06-29 DIAGNOSIS — Z91199 Patient's noncompliance with other medical treatment and regimen due to unspecified reason: Secondary | ICD-10-CM | POA: Insufficient documentation

## 2010-06-29 DIAGNOSIS — I6529 Occlusion and stenosis of unspecified carotid artery: Secondary | ICD-10-CM | POA: Insufficient documentation

## 2010-06-29 DIAGNOSIS — I5022 Chronic systolic (congestive) heart failure: Secondary | ICD-10-CM | POA: Insufficient documentation

## 2010-06-29 DIAGNOSIS — I129 Hypertensive chronic kidney disease with stage 1 through stage 4 chronic kidney disease, or unspecified chronic kidney disease: Secondary | ICD-10-CM | POA: Insufficient documentation

## 2010-06-29 DIAGNOSIS — E119 Type 2 diabetes mellitus without complications: Secondary | ICD-10-CM | POA: Insufficient documentation

## 2010-06-29 DIAGNOSIS — F1010 Alcohol abuse, uncomplicated: Secondary | ICD-10-CM | POA: Insufficient documentation

## 2010-06-29 DIAGNOSIS — R079 Chest pain, unspecified: Principal | ICD-10-CM | POA: Insufficient documentation

## 2010-06-29 DIAGNOSIS — Z95810 Presence of automatic (implantable) cardiac defibrillator: Secondary | ICD-10-CM | POA: Insufficient documentation

## 2010-06-29 DIAGNOSIS — R0989 Other specified symptoms and signs involving the circulatory and respiratory systems: Secondary | ICD-10-CM | POA: Insufficient documentation

## 2010-06-29 DIAGNOSIS — N183 Chronic kidney disease, stage 3 unspecified: Secondary | ICD-10-CM | POA: Insufficient documentation

## 2010-06-29 DIAGNOSIS — Z79899 Other long term (current) drug therapy: Secondary | ICD-10-CM | POA: Insufficient documentation

## 2010-06-29 DIAGNOSIS — R9439 Abnormal result of other cardiovascular function study: Secondary | ICD-10-CM | POA: Insufficient documentation

## 2010-06-29 LAB — GLUCOSE, CAPILLARY: Glucose-Capillary: 267 mg/dL — ABNORMAL HIGH (ref 70–99)

## 2010-06-29 LAB — CK TOTAL AND CKMB (NOT AT ARMC)
CK, MB: 6.7 ng/mL (ref 0.3–4.0)
Relative Index: 2.9 — ABNORMAL HIGH (ref 0.0–2.5)
Total CK: 235 U/L — ABNORMAL HIGH (ref 7–232)

## 2010-06-29 LAB — BASIC METABOLIC PANEL
Chloride: 100 mEq/L (ref 96–112)
Creatinine, Ser: 1.24 mg/dL (ref 0.4–1.5)
GFR calc Af Amer: >60 mL/min (ref >60)
Potassium: 4.4 mEq/L (ref 3.5–5.1)
Sodium: 134 mEq/L — ABNORMAL LOW (ref 135–145)

## 2010-06-29 LAB — DIFFERENTIAL
Basophils Absolute: 0 10*3/uL (ref 0.0–0.1)
Basophils Relative: 0 % (ref 0–1)
Eosinophils Absolute: 0.1 10*3/uL (ref 0.0–0.7)
Neutro Abs: 6.6 10*3/uL (ref 1.7–7.7)
Neutrophils Relative %: 80 % — ABNORMAL HIGH (ref 43–77)

## 2010-06-29 LAB — CBC
MCH: 31.7 pg (ref 26.0–34.0)
Platelets: 183 10*3/uL (ref 150–400)
RBC: 4.45 MIL/uL (ref 4.22–5.81)
WBC: 8.3 10*3/uL (ref 4.0–10.5)

## 2010-06-29 LAB — CARDIAC PANEL(CRET KIN+CKTOT+MB+TROPI)
CK, MB: 4.7 ng/mL — ABNORMAL HIGH (ref 0.3–4.0)
Total CK: 154 U/L (ref 7–232)

## 2010-06-29 NOTE — Telephone Encounter (Signed)
Patient's wife called regarding chest pain that started this morning.  Spoke with patient and he has had substernal chest pressure.  It was relieved with 2 NTG.  No dyspnea, nausea or diaph.  No radiation.  No syncope or ICD discharge.  Advised patient to go to the emergency room.  After speaking with his wife, she has decided to call 911 and go via EMS.  [facility] is the closest hospital.

## 2010-06-30 DIAGNOSIS — R072 Precordial pain: Secondary | ICD-10-CM

## 2010-06-30 LAB — LIPID PANEL
Cholesterol: 149 mg/dL (ref 0–200)
LDL Cholesterol: 85 mg/dL (ref 0–99)
Triglycerides: 64 mg/dL (ref <150)
VLDL: 13 mg/dL (ref 0–40)

## 2010-06-30 LAB — BASIC METABOLIC PANEL
BUN: 20 mg/dL (ref 6–23)
Chloride: 104 mEq/L (ref 96–112)
Creatinine, Ser: 1.47 mg/dL (ref 0.4–1.5)
Glucose, Bld: 169 mg/dL — ABNORMAL HIGH (ref 70–99)

## 2010-06-30 LAB — CBC
HCT: 38.2 % — ABNORMAL LOW (ref 39.0–52.0)
MCV: 92 fL (ref 78.0–100.0)
Platelets: 172 10*3/uL (ref 150–400)
RBC: 4.15 MIL/uL — ABNORMAL LOW (ref 4.22–5.81)
WBC: 7 10*3/uL (ref 4.0–10.5)

## 2010-06-30 LAB — CARDIAC PANEL(CRET KIN+CKTOT+MB+TROPI)
Relative Index: 2.7 — ABNORMAL HIGH (ref 0.0–2.5)
Troponin I: 0.05 ng/mL (ref 0.00–0.06)

## 2010-06-30 LAB — HEMOGLOBIN A1C: Mean Plasma Glucose: 189 mg/dL — ABNORMAL HIGH (ref <117)

## 2010-06-30 LAB — GLUCOSE, CAPILLARY
Glucose-Capillary: 168 mg/dL — ABNORMAL HIGH (ref 70–99)
Glucose-Capillary: 240 mg/dL — ABNORMAL HIGH (ref 70–99)
Glucose-Capillary: 256 mg/dL — ABNORMAL HIGH (ref 70–99)

## 2010-07-01 ENCOUNTER — Inpatient Hospital Stay (HOSPITAL_COMMUNITY): Payer: Medicare Other

## 2010-07-01 LAB — CBC
Hemoglobin: 12.5 g/dL — ABNORMAL LOW (ref 13.0–17.0)
MCH: 30.3 pg (ref 26.0–34.0)
MCHC: 33.4 g/dL (ref 30.0–36.0)
Platelets: 154 10*3/uL (ref 150–400)
RDW: 13.1 % (ref 11.5–15.5)

## 2010-07-01 LAB — GLUCOSE, CAPILLARY
Glucose-Capillary: 225 mg/dL — ABNORMAL HIGH (ref 70–99)
Glucose-Capillary: 154 mg/dL — ABNORMAL HIGH (ref 70–99)
Glucose-Capillary: 215 mg/dL — ABNORMAL HIGH (ref 70–99)
Glucose-Capillary: 214 mg/dL — ABNORMAL HIGH (ref 70–99)

## 2010-07-01 LAB — BASIC METABOLIC PANEL
Calcium: 8.6 mg/dL (ref 8.4–10.5)
Creatinine, Ser: 1.36 mg/dL (ref 0.4–1.5)
GFR calc Af Amer: >60 mL/min (ref >60)
GFR calc non Af Amer: 51 mL/min — ABNORMAL LOW (ref >60)
Sodium: 136 mEq/L (ref 135–145)

## 2010-07-01 MED ORDER — TECHNETIUM TC 99M TETROFOSMIN IV KIT
30.0000 | PACK | Freq: Once | INTRAVENOUS | Status: AC | PRN
  Administered 2010-07-01: 30 via INTRAVENOUS

## 2010-07-01 MED ORDER — TECHNETIUM TC 99M TETROFOSMIN IV KIT
10.0000 | PACK | Freq: Once | INTRAVENOUS | Status: AC | PRN
  Administered 2010-07-01: 10 via INTRAVENOUS

## 2010-07-01 NOTE — H&P (Signed)
NAME:  [name] [name], BELAY NO.:  [id number]  MEDICAL RECORD NO.:  [id number]           PATIENT TYPE:  I  LOCATION:  2003                         FACILITY:  [facility]  PHYSICIAN:  Jake Bathe, MD      DATE OF BIRTH:  1934/05/28  DATE OF ADMISSION:  06/29/2010 DATE OF DISCHARGE:                             HISTORY & PHYSICAL   CARDIOLOGIST:  Doylene Canning. Ladona Ridgel, MD  PRIMARY CARE PHYSICIAN:  Dwana Curd. Para March, MD  CHIEF COMPLAINT:  Chest pain.  HISTORY OF PRESENT ILLNESS:  This is a 76 year old male with ischemic cardiomyopathy, status post bypass surgery in 2008 x3 with an ejection fraction of 15%, status post Medtronic ICD in 2009 with underlying peripheral vascular disease, chronic kidney disease, and prior nonadherence to medications as well as alcoholism who was up this morning at 5 a.m. and had substernal chest discomfort rated at a 4/10 which was unusual for him.  He also felt a little shortness of breath as well as some pressure.  He took nitroglycerin x2 half an hour apart.  He called EMS and took an aspirin at that time and by that point his chest pain was free.  He feels as though he has no energy.  He did have a cough this morning, but denies any fevers, chills, production of sputum, or any bleeding episodes.  When questioned about social history, he denied smoking, but his wife says that every now and then he may smoke and he also stated that he drank only a glass of wine here and there and his wife stated that he drank approximately a bottle at night and then mentioned some beer.  He became very angry at that point toward her and showed quite a bit of disrespect towards her at that point.  He has not noticed any increasing exertional angina over the past few days, has not noticed any increase in weight gain, but I doubt that he is taking his weights.  No increase in swelling.  Currently, he is comfortable sitting up in bed.  PAST MEDICAL HISTORY.: 1.  Coronary artery disease, status post bypass graft x3 in 2008. 2. Ischemic cardiomyopathy, EF 15-20%. 3. Nonsustained ventricular tachycardia, status post Medtronic single     lead defibrillator in December 2009 by Dr. Ladona Ridgel. 4. Peripheral vascular disease, 60-80% left internal carotid artery. 5. Diabetes, uncontrolled, hemoglobin A1c last check 8.4. 6. Hypertension. 7. Obesity. 8. Alcohol use. 9. Stage III chronic kidney disease.  ALLERGIES:  No known drug allergies.  MEDICATIONS:  This was provided by his wife that was updated on May 20, 2010, during a recent visit with Dr. Crawford Givens.  Medications are: 1. Plavix 75 mg once a day. 2. Levemir solution 20 units every morning. 3. Crestor 20 mg at bedtime. 4. Metformin 1000 mg twice a day. 5. Spironolactone 25 mg 1/2 tablet by mouth a day. 6. Cinnamon 500 mg 2 tablets a day by mouth. 7. Ambien 10 mg at bedtime for insomnia using sparingly. 8. Omega 3 plus vitamins by mouth. 9. NovoLog insulin sliding scale 3 times a day. 10.Carvedilol noted as 25 mg  take 1-1/2 tablets 2 times a day.11.Klor-Con 20 mEq tablets 2 daily. 12.Aspirin 81 mg 2 times a day. 13.Lisinopril 20 mg 1 tablet a day. 14.Grapeseed extract  SOCIAL HISTORY:  Occasionally smokes according to his significant other. Large alcohol consumption, perhaps a bottle of one at night according to his significant other.  FAMILY HISTORY:  Currently noncontributory.  REVIEW OF SYSTEMS:  Unless specified above, all other 12 review of systems negative.  PHYSICAL EXAMINATION:  VITAL SIGNS:  Blood pressure on arrival was 201/122, on repeat 141/79 with a pulse ranging from 90-82, respiration rate 14, temperature 97.7, and saturating  99% on room air. GENERAL:  He is alert and oriented x3, sitting comfortably upon his bed, somewhat angry at times. EYES:  Well-perfused conjunctivae.  EOMI.  No scleral icterus. NECK:  Supple.  No lymphadenopathy.  No thyromegaly.  No carotid  bruits. No JVD appreciated. CARDIOVASCULAR:  Regular rate and rhythm.  Normal S1 and S2.  I do not appreciate an S3 gallop currently.  No appreciable or significant murmurs appreciated. LUNGS:  Clear to auscultation bilaterally.  I do not appreciate any crackles in posterior bases. ABDOMEN:  Obese, mildly distended, soft, nontender.  Positive bowel sounds. EXTREMITIES:  Trace edema bilaterally. GU:  Deferred. RECTAL:  Deferred. NEURO:  Nonfocal.  No tremors are noted.  LABORATORY DATA:  Chest x-ray pending.  Troponin 0.05c CK was 235, and MB was 6.7 mildly elevated.  Troponin was normal.  Sodium 134, potassium 4.4, BUN 15, and creatinine 1.24.  White count 8.3, hemoglobin 14.1, hematocrit 40.9, and platelet count 183.  Hemoglobin A1c from May 15, 2010, was 8.4, elevated.  EKG shows first-degree AV block, normal sinus rhythm, left bundle-branch block.  No significant change from prior.  No ST-segment changes.  Hemoglobin 14.1.  ASSESSMENT/PLAN:  This is a 76 year old male with ischemic cardiomyopathy, ejection fraction 15-20%, status post bypass, status post defibrillator secondary to ventricular tachycardia, history of medication nonadherence, and alcohol use, here with possible acute coronary syndrome/unstable angina and chronic systolic heart failure. 1. Unstable angina - possible ACS - given his extensive coronary     artery disease history I will go ahead and place him on heparin     while we continue to cycle cardiac biomarkers.  His troponin     currently is normal, yet his CK and MB are mildly elevated.  This     may be muscular in etiology.  We will continue to monitor.     Nonetheless he was having chest discomfort that was relieved with     nitroglycerin earlier this morning.  Continue with nitroglycerin     p.r.n.  I will also continue with aspirin and Plavix and continue     his beta-blocker.  We will resume his dose as was printed on the     sheet of 37.5 twice a  day of carvedilol.  He does have a     defibrillator in place in case pacemaker backup as needed.  This is     not a biventricular ICD, however.  Depending on trends of cardiac biomarkers, we will may consider continued medical therapy or perhaps if he continues to have worsening chest discomfort, cardiac catheterization.  One would need to be cognizant of his current creatinine.  1. Chronic systolic heart failure - I will go ahead and give him Lasix     40 mg IV x1 here in the emergency room.  He does not sound volume     overloaded,  however, he was complaining of some mild dyspnea this     morning which seems to have improved.  I did not see furosemide on     his medication list, however, he did have spironolactone.  I will     go ahead and place him on 40 mg of Lasix daily as was on his     previous discharge summary.  Continue with spironolactone.  Watch     his potassium closely.  Beta-blocker.  Continue with ACE inhibitor,     lisinopril 20 mg once a day. 2. Diabetes - uncontrolled according to prior hemoglobin A1c.  I will     place him on his home regimen.  Insulin sliding scale as needed. 3. Obesity - encourage weight loss. 4. Alcohol use - encourage cessation.  He was quite belligerent when     his wife revealed that he can drink a bottle of wine at night.  I     explained him that the issue is with that especially with his     weakened heart.  His significant other stated that he was in     somewhat denial about the severity of his heart illness. 5. Peripheral vascular disease - stable, 80% left internal carotid     artery. 6. Chronic kidney disease stage III - continue to monitor his     creatinine.     Jake Bathe, MD     MCS/MEDQ  D:  06/29/2010  T:  06/30/2010  Job:  [id number]  Electronically Signed by Donato Schultz MD on 07/01/2010 02:39:08 PM

## 2010-07-02 ENCOUNTER — Other Ambulatory Visit (HOSPITAL_COMMUNITY): Payer: Medicare Other

## 2010-07-02 DIAGNOSIS — I2510 Atherosclerotic heart disease of native coronary artery without angina pectoris: Secondary | ICD-10-CM

## 2010-07-02 LAB — BASIC METABOLIC PANEL
BUN: 17 mg/dL (ref 6–23)
Calcium: 9 mg/dL (ref 8.4–10.5)
GFR calc non Af Amer: 54 mL/min — ABNORMAL LOW (ref >60)
Glucose, Bld: 133 mg/dL — ABNORMAL HIGH (ref 70–99)
Potassium: 4.1 mEq/L (ref 3.5–5.1)
Sodium: 139 mEq/L (ref 135–145)

## 2010-07-02 LAB — CBC
MCHC: 33.9 g/dL (ref 30.0–36.0)
Platelets: 173 10*3/uL (ref 150–400)
RDW: 13.1 % (ref 11.5–15.5)
WBC: 5.8 10*3/uL (ref 4.0–10.5)

## 2010-07-02 LAB — PROTIME-INR
INR: 1.06 (ref 0.00–1.49)
Prothrombin Time: 14 seconds (ref 11.6–15.2)

## 2010-07-03 NOTE — Procedures (Signed)
  NAME:  [name] [name], [name] [name]               ACCOUNT NO.:  [id number]  MEDICAL RECORD NO.:  [id number]           PATIENT TYPE:  LOCATION:                                 FACILITY:  PHYSICIAN:  Wlliam Grosso C. Eden Emms, MD, FACCDATE OF BIRTH:  06-Oct-1934  DATE OF PROCEDURE: DATE OF DISCHARGE:                           CARDIAC CATHETERIZATION   INDICATIONS:  Congestive heart failure, ischemic cardiomyopathy, positive Myoview.  Cine catheterization was done with 6-French catheters from the right femoral artery.  The patient had very tortuous iliacs.  Torquing of the catheters was very difficult.  We had to upgrade to a long 6-French sheath.  The left internal mammary artery was cannulated with a LIMA catheter, the circumflex graft was cannulated with a left bypass graft, and the right saphenous vein graft was cannulated with a standard JR-4 catheter.  Left main coronary artery had a 60% eccentric stenosis.  Left anterior descending artery was 100% occluded in the midvessel. There was a very small diagonal branch with 70% proximal disease.  This vessel was small and not suitable for angioplasty.  Circumflex coronary artery had 50% to 60% tubular disease proximally.  There was 80% disease in the AV groove branch.  There was a low-lying obtuse marginal branch with 50% tubular disease.  The surgical report indicates a second obtuse marginal branch that was grafted.  Right coronary artery was 100% occluded in the midvessel.  The left internal mammary artery was widely patent to the mid and distal LAD.  There was no severe stenosis in the native vessel.  The saphenous vein graft to the obtuse marginal branch was widely patent with good retrograde flow to the circumflex artery.  The saphenous vein graft to the PDA was widely patent.  There was 40% eccentric lesions in the proximal, mid, and distal body of this graft.  Because the case was somewhat difficult and we used 130 mL of dye, we did not  shoot a ventriculogram.  The valve was crossed for pressures. LV pressure was 149/16, aortic pressure was 146/75.  IMPRESSION:  The patient's grafts were widely patent.  He is still well revascularized.  He appears euvolemic with a low EDP.  He be discharged later today.  Given that he is a diabetic, he should probably have a BMET checked on Friday, particularly since one of his home medications is spironolactone.  He tolerated the procedure well.     Noralyn Pick. Eden Emms, MD, [facility]     PCN/MEDQ  D:  07/02/2010  T:  07/02/2010  Job:  [id number]  Electronically Signed by Charlton Haws MD [facility] on 07/03/2010 11:10:45 PM

## 2010-07-04 ENCOUNTER — Other Ambulatory Visit: Payer: Self-pay | Admitting: Family Medicine

## 2010-07-05 ENCOUNTER — Other Ambulatory Visit (INDEPENDENT_AMBULATORY_CARE_PROVIDER_SITE_OTHER): Payer: Medicare Other

## 2010-07-05 LAB — BASIC METABOLIC PANEL
BUN: 28 mg/dL — ABNORMAL HIGH (ref 6–23)
CO2: 28 mEq/L (ref 19–32)
Calcium: 9.6 mg/dL (ref 8.4–10.5)
GFR: 43.01 mL/min — ABNORMAL LOW (ref >60.00)
Glucose, Bld: 133 mg/dL — ABNORMAL HIGH (ref 70–99)

## 2010-07-08 ENCOUNTER — Encounter: Payer: Self-pay | Admitting: Family Medicine

## 2010-07-08 ENCOUNTER — Ambulatory Visit (INDEPENDENT_AMBULATORY_CARE_PROVIDER_SITE_OTHER): Payer: Medicare Other | Admitting: Family Medicine

## 2010-07-08 VITALS — BP 130/70 | HR 80 | Temp 98.4°F | Ht 68.0 in | Wt 255.0 lb

## 2010-07-08 DIAGNOSIS — E119 Type 2 diabetes mellitus without complications: Secondary | ICD-10-CM

## 2010-07-08 DIAGNOSIS — I5022 Chronic systolic (congestive) heart failure: Secondary | ICD-10-CM

## 2010-07-08 LAB — BASIC METABOLIC PANEL
BUN: 21 mg/dL (ref 6–23)
Calcium: 9.1 mg/dL (ref 8.4–10.5)
Creatinine, Ser: 1.3 mg/dL (ref 0.4–1.5)

## 2010-07-08 NOTE — Assessment & Plan Note (Addendum)
Will recheck BMET today.  CP free.  Has fu with cards.  BP okay and appears not to be fluid overloaded.

## 2010-07-08 NOTE — Progress Notes (Signed)
Hospital FU.  Records reviewed.  Had CP, "more than normal."  Went to [facility].  Had abnormal myoview and that lead to cath with patent grafts.  Here for fu today.  No more CP. "Feeling okay" otherwise.    Drinking- "a glass every once in a while."  Wine, no beer.  Admits to 1/2 a glass yesterday.  I talked to him about cutting down and not quitting cold [place] if he was drinking more than that.    He is still using his injection insulin and needs PA done on both.  I talked to him about renal function and his metformin dose.  Had contrast with the cath, also on mult BP meds that can affect his Cr (and he has DM2).  I d/w pt about Cr and he'll get repeat BMET today.    Meds, vitals, and allergies reviewed.   ROS: See HPI.  Otherwise, noncontributory.  GEN: nad, alert and oriented HEENT: mucous membranes moist NECK: supple w/o LA CV: rrr  PULM: ctab, no inc wob ABD: soft, +bs EXT: trace edema SKIN: no acute rash

## 2010-07-08 NOTE — Patient Instructions (Signed)
We'll contact you with your lab report.   Schedule a follow up appointment in: 3 months for labs with a OV a few days later with me.  Take care.   I would cut down on drinking wine and then stop it totally.  Keep your appointment with cardiology.   Take care.

## 2010-07-08 NOTE — Assessment & Plan Note (Addendum)
D/w pt re: meds/diet/etoh.  Recheck BMET and if Cr still elevated, will need to address metformin (which was held).  D/w pt.  We'll call about the PA on his meds.  He wasn't able to manipulate the syringes prev and needs the pens for injections.  Fu 3 months re:Dm2.  >25 min spent with patient, at least half of which was spent on counseling UE:AVWU.

## 2010-07-11 ENCOUNTER — Other Ambulatory Visit: Payer: Self-pay | Admitting: *Deleted

## 2010-07-11 MED ORDER — DIAZEPAM 10 MG PO TABS: ORAL_TABLET | ORAL | Status: DC

## 2010-07-11 NOTE — Telephone Encounter (Signed)
Refilled.  Was previously on med 2010, d/c'd 02/2010.  Will route to pcp to be aware of addition.  Please call in.

## 2010-07-15 ENCOUNTER — Other Ambulatory Visit: Payer: Self-pay | Admitting: *Deleted

## 2010-07-15 MED ORDER — CLOPIDOGREL BISULFATE 75 MG PO TABS: 75.0000 mg | ORAL_TABLET | Freq: Every day | ORAL | Status: DC

## 2010-07-15 NOTE — Telephone Encounter (Signed)
Phoned in medication

## 2010-07-16 NOTE — Discharge Summary (Signed)
NAME:  [name] [name], KLIMOWICZ               ACCOUNT NO.:  [id number]  MEDICAL RECORD NO.:  [id number]           PATIENT TYPE:  I  LOCATION:  2003                         FACILITY:  [facility]  PHYSICIAN:  Noralyn Pick. Eden Emms, MD, FACCDATE OF BIRTH:  [birth year]  DATE OF ADMISSION:  06/29/2010 DATE OF DISCHARGE:  07/02/2010                              DISCHARGE SUMMARY   PRIMARY CARDIOLOGIST:  Doylene Canning. Ladona Ridgel, MD.  PRIMARY CARE PROVIDER:  Dwana Curd. Para March, MD  DISCHARGE DIAGNOSES: 1. Chest pain with abnormal Myoview demonstrating an inferoseptal     ischemia.  The patient is status post cardiac catheterization     demonstrating patent grafts this admission. 2. Ischemic cardiomyopathy/chronic systolic heart failure, ejection     fraction of 30%. 3. Coronary artery disease, status post coronary artery bypass     grafting x3 in 2008. 4. Nonsustained ventricular tachycardia, status post [employer] single     lead defibrillator in December 2009 by Dr. Ladona Ridgel. 5. Chronic kidney disease, stage III. 6. Continued alcohol use. 7. Noninsulin-dependent diabetes mellitus, uncontrolled. 8. Hypertension.  SECONDARY DIAGNOSIS:  Peripheral vascular disease, 60-80% left internal carotid artery.  PROCEDURES/DIAGNOSTICS PERFORMED DURING HOSPITALIZATION: 1. Cardiac catheterization on July 02, 2010:  3/3 grafts patent.     LIMA to LAD and SVG to OM1 without obstruction.  SVG to RCA with     40% proximal mid plaque. 2. Myoview on July 01, 2010:  Moderate reversible defect in the mid     and distal anterior septal wall, consistent with myocardial     ischemia.  Inferior wall myocardial infarct.  Hypokinesis of the     inferior, septal, and apical walls with calculated left ventricular     ejection fraction of 24%. 3. Chest x-ray on June 29, 2010:  Cardiomegaly without edema.  Stable     AICD device.  REASON FOR HOSPITALIZATION:  This is a 76 year old gentleman with an extensive cardiac history, as above, who  developed substernal chest discomfort on the morning of admission associated with mild shortness of breath.  The patient took two sublingual nitroglycerin without relief and called EMS.  By the time they arrived, he was chest pain free.  The patient was admitted for further treatment of unstable angina and possible acute coronary syndrome.  His EKG showed first-degree AV block with normal sinus rhythm and a left bundle-branch block that was unchanged from prior tracings.  He was continued on his home dose of aspirin, Plavix, and Coreg.  HOSPITAL COURSE:  The patient was admitted to telemetry and placed on IV heparin per Pharmacy.  He had no further complaints of chest pain or shortness of breath.  The patient's troponins remained normal, although he had a mild elevation of CK-MB.  With the patient ruling out for myocardial infarction, a Myoview was planned.  The patient completed a Lexiscan Myoview.  This demonstrated moderate reversible defect in the mid and distal anterior septal walls consistent with myocardial ischemia.  With the patient's symptoms, cardiac history, and abnormal Myoview, indication was to pursue with cardiac catheterization.  Risks and benefits were discussed with the patient and his wife and  he agreed to proceed.  With the patient's chronic kidney disease, his renal function was monitored closely.  He was hydrated appropriately prior to catheterization and no ventriculogram was scheduled.  On July 02, 2010, Dr. Eden Emms brought the patient to the cath lab, informed consent was obtained.  As above, it was noted that the patient had patent graft, 3/3.  It appears the patient's chest pain was noncardiac, and he will be continued on medical management.  He will have a BMET in 72 hours to follow up on his renal function post cardiac catheterization.  Of note, the patient was complaining of mild dyspnea on the day of admission.  Therefore, he was given 40 mg of IV Lasix,  although it did not appear that he was volume overloaded.  The patient tolerated this well.  On the day of discharge, Dr. Eden Emms evaluated the patient and noted him stable for home.  His right groin was without evidence of hematoma.  He was able to ambulate in the halls without difficulty.  The patient will be discharged in stable condition.  DISCHARGE LABS:  WBC 5.8, hemoglobin 12.9, hematocrit 38.1, platelets 173.  INR 1.06.  Sodium 149, potassium 4.1, BUN 17, creatinine 1.31.  DISCHARGE MEDICATIONS: 1. Ambien 10 mg 1 tablet daily at bedtime as needed for sleep. 2. Aspirin 81 mg 1 tablet daily. 3. Cinnamon 500 mg 2 capsules daily. 4. Plavix 75 mg 1 tablet daily. 5. Coreg 25 mg 1/2 tablet twice daily. 6. Crestor 20 mg 1 tablet daily. 7. Ecko over-the-counter 1 tablet twice daily. 8. Furosemide 40 mg 1 tablet daily. 9. Grape seed extract 1 capsule twice daily. 10.Levemir 20 units subcutaneously every morning. 11.Lisinopril 10 mg daily. 12.Metformin 1000 mg 1 tablet twice daily.  The patient will hold     until Friday, July 05, 2010. 13.Nitroglycerin 0.4 mg tablets sublingual 1 tablet under tongue every     5 minutes up to 3 doses as needed for chest pain. 14.NovoLog sliding scale insulin subcutaneously 3 times daily. 15.Omega-3 acid. 16.Ethyl esters 1 g 2 capsules daily. 17.Potassium chloride 20 mEq 2 tablets daily. 18.Spironolactone 25 mg half a tablet daily.  FOLLOWUP PLANS AND INSTRUCTIONS: 1. The patient will have blood work drawn, this includes a basic     metabolic panel at [facility] on July 05, 2010 at 11 a.m. 2. The patient will follow up with Dr. Ladona Ridgel on July 26, 2010 at     10:30 a.m.  This is in our [place] office. 3. The patient will follow up with Dr. Para March as previously scheduled. 4. The patient is to increase activity slowly.  He may shower, but no     bathing.  No lifting for 1 week greater than 5 pounds.  No driving     for 2 days.  No sexual  activity for 1 week.  He is to keep his cath     site clean and dry and call office for any problems. 5. The patient is to continue low-sodium, heart-healthy, and diabetic     diet. 6. The patient is to avoid straining and stop any activity that causes     chest pain or shortness of breath. 7. The patient is to call the office for any problems or concerns in     the interim.  DURATION OF DISCHARGE:  Greater than 30 minutes with physician and physician extender time.     Leonette Monarch, PA-C   ______________________________ Noralyn Pick Eden Emms, MD, [facility]  NB/MEDQ  D:  07/02/2010  T:  07/03/2010  Job:  [id number]  cc:   Doylene Canning. Ladona Ridgel, MD Dwana Curd. Para March, M.D.  Electronically Signed by Alen Blew P.A. on 07/10/2010 10:00:38 AM Electronically Signed by Charlton Haws MD [facility] on 07/16/2010 12:03:14 PM

## 2010-07-26 ENCOUNTER — Encounter: Payer: Self-pay | Admitting: Internal Medicine

## 2010-07-26 ENCOUNTER — Ambulatory Visit (INDEPENDENT_AMBULATORY_CARE_PROVIDER_SITE_OTHER): Payer: Medicare Other | Admitting: Internal Medicine

## 2010-07-26 DIAGNOSIS — I5022 Chronic systolic (congestive) heart failure: Secondary | ICD-10-CM

## 2010-07-26 DIAGNOSIS — I2589 Other forms of chronic ischemic heart disease: Secondary | ICD-10-CM

## 2010-07-26 DIAGNOSIS — Z95810 Presence of automatic (implantable) cardiac defibrillator: Secondary | ICD-10-CM

## 2010-07-26 DIAGNOSIS — I10 Essential (primary) hypertension: Secondary | ICD-10-CM

## 2010-07-26 NOTE — Patient Instructions (Signed)
Continue your current medications. Your physician recommends that you schedule a follow-up appointment in: 6 months

## 2010-07-31 ENCOUNTER — Encounter: Payer: Self-pay | Admitting: Internal Medicine

## 2010-07-31 NOTE — Assessment & Plan Note (Signed)
He denies anginal symptoms. We'll recheck in several months. I've encouraged him to lose weight and increase his activity.

## 2010-07-31 NOTE — Assessment & Plan Note (Signed)
His blood pressure is reasonably well controlled. I've asked him to maintain a low-sodium diet and stop drinking alcohol.

## 2010-07-31 NOTE — Progress Notes (Signed)
HPI Mr. Gaspari returns today for followup. He is a pleasant 76 year old man with a long-standing ischemic cardiomyopathy, congestive heart failure, and nonsustained ventricular tachycardia. He is status post ICD insertion. He has had no recurrent ICD shocks. He has tried to decrease his alcohol consumption. I suspect he is still drinking too much. No other specific complaints today. No Known Allergies   Current Outpatient Prescriptions  Medication Sig Dispense Refill  . aspirin 81 MG tablet Take 81 mg by mouth 2 (two) times daily.        . carvedilol (COREG) 25 MG tablet Take one and one half tablets two times a day       . Cinnamon 500 MG capsule Take 2 tablets daily by mouth       . clopidogrel (PLAVIX) 75 MG tablet Take 1 tablet (75 mg total) by mouth daily.  30 tablet  11  . diazepam (VALIUM) 10 MG tablet Take 1/2 to 1 tablet at bedtime as needed for ringing in the ears.  20 tablet  0  . glucose blood (ACCU-CHEK AVIVA) test strip Use one strip twice a day       . Grape Seed Extract 60 MG CAPS Take one by mouth 2 times a day       . insulin aspart (NOVOLOG PENFILL) 100 UNIT/ML injection Scale: sugar 100-150, 15 units. 150-200, 17 units. 200-250, 19 units. 250+, 21 units.  Use 3 times a day.       . insulin detemir (LEVEMIR FLEXPEN) 100 UNIT/ML injection Inject 20 units every morning       . lisinopril (PRINIVIL,ZESTRIL) 20 MG tablet Take 20 mg by mouth daily.        . metFORMIN (GLUCOPHAGE) 1000 MG tablet Take 1,000 mg by mouth daily with breakfast.       . Omega-3 Fatty Acids (OMEGA-3 PLUS PO) Take by mouth. Take 2 tablets by mouth daily       . potassium chloride SA (K-DUR,KLOR-CON) 20 MEQ tablet Take 2 by mouth daily       . rosuvastatin (CRESTOR) 20 MG tablet Take 20 mg by mouth at bedtime.        Marland Kitchen spironolactone (ALDACTONE) 25 MG tablet Take 1/2 tablet daily       . zolpidem (AMBIEN) 10 MG tablet Take 10 mg by mouth at bedtime as needed. For insomnia, sparingly          Past  Medical History  Diagnosis Date  . COPD (chronic obstructive pulmonary disease) 1993  . Diabetes mellitus, type 2 2000  . Hyperlipidemia 1993  . Hypertension 1993  . CHF (congestive heart failure)     hospital, med nonadherence 2/5-05/16/09  . CAD (coronary artery disease)     bypass graft surgery 05/2006  . Ischemic cardiomyopathy     EF 30-55%  . Systolic heart failure     class II  . S/P ICD (internal cardiac defibrillator) procedure 2010    single  . Foot fracture 1953    left    ROS:   All systems reviewed and negative except as noted in the HPI.   Past Surgical History  Procedure Date  . Coronary artery bypass graft     3 vessel, class 4 CHF, non Q wave MI 05/19/06     Family History  Problem Relation Age of Onset  . Heart disease Father     MI age 72  . Diabetes Sister   . Hyperlipidemia Sister   . Cancer Maternal  Grandmother     liver, cirrhosis  . Depression Neg Hx   . Alcohol abuse Neg Hx   . Drug abuse Neg Hx   . Stroke Neg Hx      History   Social History  . Marital Status: Married    Spouse Name: N/A    Number of Children: 0  . Years of Education: N/A   Occupational History  . 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 Smoking status: Current Some Day Smoker    Types: Cigars  . Smokeless tobacco: Never Used  . Alcohol Use: Yes     h/o significant alcohol consumption per chart, but as of 2012 patient has cut back to 1 bottle of wine per night.  . Drug Use: Not on file  . Sexually Active: Not on file   Other Topics Concern  . 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     BP 138/82  Pulse 84  Ht 5\' 8"  (1.727 m)  Wt 253 lb (114.76 kg)  BMI 38.47 kg/m2  Physical Exam:  Obese well appearing NAD HEENT: Unremarkable Neck:  No JVD, no thyromegally Lymphatics:  No adenopathy Back:  No CVA  tenderness Lungs:  Clear. well-healed ICD insertion HEART:  Regular rate rhythm, no murmurs, no rubs, no clicks Abd:  Flat, positive bowel sounds, no organomegally, no rebound, no guarding Ext:  2 plus pulses, no edema, no cyanosis, no clubbing Skin:  No rashes no nodules Neuro:  CN II through XII intact, motor grossly intact  DEVICE  Normal device function.  See PaceArt for details.   Assess/Plan:

## 2010-07-31 NOTE — Assessment & Plan Note (Signed)
His device is working normally. We'll recheck in several months. 

## 2010-08-02 ENCOUNTER — Telehealth: Payer: Self-pay | Admitting: Internal Medicine

## 2010-08-02 NOTE — Telephone Encounter (Signed)
Pt needs an authorization for cardiac rehab at [facility].  Pt is being told by [facility] that Dr Eden Emms needs to send this because he did the pt's cath.

## 2010-08-05 NOTE — Telephone Encounter (Signed)
Forwarded to Dr. Fabio Bering nurse.

## 2010-08-05 NOTE — Telephone Encounter (Signed)
Pt wife made aware that order faxed to rehab at [facility]

## 2010-08-08 ENCOUNTER — Other Ambulatory Visit: Payer: Self-pay | Admitting: *Deleted

## 2010-08-08 MED ORDER — FUROSEMIDE 40 MG PO TABS: 40.0000 mg | ORAL_TABLET | Freq: Every day | ORAL | Status: DC

## 2010-08-20 ENCOUNTER — Other Ambulatory Visit: Payer: Medicare Other

## 2010-08-20 NOTE — Assessment & Plan Note (Signed)
[facility]                            CARDIOLOGY OFFICE NOTE   NAME:[name], [name]                        MRN:          [id number]  DATE:08/04/2006                            DOB:          1934/09/04    PRIMARY CARE PHYSICIAN:  Arta Silence, M.D.   CLINICAL HISTORY:  [name] [name] is 76 years old and was admitted to [facility] in February with congestive heart failure and non-ST-elevation  infarction.  He was found to have 3-vessel disease and severe depression  of his left ventricular function with ejection fraction of 15-20% and  underwent bypass surgery by Dr. Cornelius Moras.   Despite his severe left ventricular dysfunction, he did well after that  and has been doing fairly well from a standpoint of his heart recently.  He has had no shortness of breath, chest pain, or palpitations.   He has had some problems with memory and also with mental status, and he  saw Dr. Orlin Hilding, who recommended a CT or MR, but [name] [name] wanted to  wait.   PAST MEDICAL HISTORY:  Significant for diabetes and hyperlipidemia.  A  lipid panel recently by Dr. Hetty Ely, which was fairly good.   CURRENT MEDICATIONS:  Aspirin.  Coreg.  Lipitor.  Plavix.  Glipizide.  Spironolactone.  Potassium.  Furosemide.  Metformin.  Lisinopril.   REVIEW OF SYSTEMS:  Positive for recent staph skin infection, which is  being treated.   EXAMINATION:  Blood pressure 162/90, pulse 88 and regular.  There was no venous distension.  The carotid pulses were full without  bruits.  CHEST:  Clear.  CARDIAC:  Rhythm was regular.  I could hear no murmurs or gallops.  ABDOMEN:  Soft without organomegaly.  Peripheral pulses are full and there is no peripheral edema.   IMPRESSION:  1. Coronary artery disease status post non-ST-elevation myocardial      infarction and pulmonary edema with subsequent bypass surgery in      February of 2008.  2. Ischemic cardiomyopathy, ejection fraction 15-20%.  3.  Hypertension.  4. Diabetes.  5. Hyperlipidemia.  6. Altered mental status.   RECOMMENDATIONS:  [name] appears to be doing fairly well.  His  mental status does not appear too bad, although his significant other  suggested his personality is not the same his memory is somewhat less.  Will increase his Coreg from 12.5 twice a day to 1-and-a-half twice a  day, and increase his lisinopril from 10 twice a day to 1-and-a-half  twice a day to help optimize treatment of his LV dysfunction.  At his  request, will switch him from Lipitor 40 to Crestor 20, and get a lipid  and liver profile in 6 weeks.  We will get an echocardiogram to  reevaluate his LV function as well as a BNP in the next 2 weeks.  I will  see him back in 2 months.     Bruce Elvera Lennox Juanda Chance, MD, [facility]  Electronically Signed    BRB/MedQ  DD: 08/04/2006  DT: 08/04/2006  Job #: [id number]   cc:   Molly Maduro  Madolyn Frieze, MD

## 2010-08-20 NOTE — Assessment & Plan Note (Signed)
[facility]                            CARDIOLOGY OFFICE NOTE   NAME:Nevitt, HASKELL                        MRN:          [id number]  DATE:09/21/2006                            DOB:          January 16, 1935    PRIMARY CARE PHYSICIAN:  Arta Silence, MD   CLINICAL HISTORY:  Mr. Serviss is 76 years old and in February was  admitted to [facility] with congestive heart failure and non-ST  elevation infarction.  He was found to have three vessel coronary artery  disease and an ejection fraction of 15 to 20% and underwent bypass  surgery by Dr. Cornelius Moras.   He has done fairly well since that time and we repeated his  echocardiogram in May and his ejection fraction had improved to 35 to  40%.  He did have some trouble with memory early after his surgery but  this appears to be stable and does not appear to be severely disabling.  He says he has had no chest pain, shortness of breath, palpitations or  swelling.   PAST MEDICAL HISTORY:  His past medical history is significant for  diabetes and hyperlipidemia.   CURRENT MEDICATIONS:  His significant other indicated that he has not  been taking his medications regularly, although, he says he has.  He is  not quite sure what he is taking, however.  We think his medicine list  should include aspirin, Coreg, lisinopril, Crestor, Plavix, glipizide,  spironolactone, potassium, furosemide.   PHYSICAL EXAMINATION:  VITAL SIGNS:  Blood pressure is 182/116, pulse 78  and regular.  NECK:  There is no venous distention.  Carotid pulses were full without  bruits.  CHEST:  Clear.  CARDIAC:  Rhythm was regular.  He has no murmurs or gallops.  ABDOMEN:  Soft without organomegaly.  EXTREMITIES:  Peripheral pulses are full.  There is no peripheral edema.   CLINICAL DATA:  An ECG showed left ventricular hypertrophy with QRS  widening.   IMPRESSION:  1. Coronary artery disease status post non-ST elevation infarction and     pulmonary edema with subsequent bypass surgery February 2008.  2. Ischemic cardiomyopathy with ejection fraction of 15 to 20%      improving to 35 to 40% by echocardiogram in May.  3. Hypertension, not under good control, probably related to      noncompliance.  4. Diabetes.  5. Hyperlipidemia.  6. History of mental disturbance following bypass surgery.   RECOMMENDATIONS:  Mr. Clemon appears to be better from the standpoint of  his heart but it does not appear that he is taking his medicines  regularly, although he will not admit to this.  His  blood pressure is quite high today and we will recheck this and if it  remains high we will have him come back in one week.  We will also  arrange a followup in three months.     Bruce Elvera Lennox Juanda Chance, MD, [facility]  Electronically Signed    BRB/MedQ  DD: 09/21/2006  DT: 09/21/2006  Job #: [id number]   cc:   Arta Silence, MD

## 2010-08-20 NOTE — Assessment & Plan Note (Signed)
[facility]                            CARDIOLOGY OFFICE NOTE   NAME:Kuiken, LASZLO                        MRN:          [id number]  DATE:07/15/2007                            DOB:          [birth year]    PRIMARY CARE PHYSICIAN:  Dr. Laurita Quint.   CLINICAL HISTORY:  Mr. Weigel is 76 years old and returns for follow-up  management of his coronary heart disease.  In February of 2008, he was  admitted with congestive heart failure and a non-ST-elevation myocardial  infarction and was found to have three-vessel coronary disease and an  ejection fraction of 15-20% and underwent bypass surgery.  His ejection  fraction improved to 35-40% by echocardiogram in May, but he had an  echocardiogram just prior to this visit which showed an ejection  fraction of 25-35%.   He says he has been feeling pretty well from the standpoint of his heart  without chest pain or shortness of breath or palpitations, but he has  been very inactive.  He has had some problems with low back pain and  finally has improved with some medication.  He is ready to start on a  walking program.   PAST MEDICAL HISTORY:  Significant for diabetes and hyperlipidemia.   CURRENT MEDICATIONS:  Include:  1. Aspirin.  2. Coreg 12.5 mg 1/2 tablet b.i.d.  3. Lisinopril 10 mg 1/2 tablet b.i.d.  4. Crestor 20 mg daily.  5. Plavix.  6. Glipizide.  7. Spironolactone 25 mg daily.  8. Klor-Con 20 mEq daily.  9. Furosemide 40 mEq daily.  10.Levemir pen.  11.Norvasc 10 mg daily.  12.Metformin 1000 mg daily.   On examination, blood pressure 143/90 with a pulse 93 and regular.  There was no venous distension.  The carotid pulses were full without  bruits.  CHEST:  Was clear.  CARDIAC:  Rhythm was regular.  No murmurs or gallops.  ABDOMEN:  Soft without organomegaly.  Peripheral pulses full, and there  was no peripheral edema.   IMPRESSION:  1. Coronary artery status post coronary bypass  graft surgery in      February 2008.  2. Ischemic cardiomyopathy, ejection fraction 25-35%.  3. Class II systolic congestive heart failure.  4. Hypertension.  5. Diabetes.  6. Hyperlipidemia.   RECOMMENDATIONS:  Mr. Swiatek ejection fraction has decreased some on  his most recent echocardiogram.  He is now at a level where he may be a  candidate for an ICD.  QRS duration is 114, and he is probably only  class II, so he is probably not a candidate for BiV pacer at this time.  Will plan to increase his Coreg from 12.5 mg 1/2 tablet b.i.d. to 25 mg  b.i.d. and will increase his lisinopril 10 mg 1/2 tablet b.i.d. to 20 mg  b.i.d.  Will get a BMP, BNP, and CBC today and get a BMP in 1 week.  I  will plan to see him back in 3 months and make further adjustments in  his medications.  We will plan another echocardiogram in 6 months, and  if  he continues with an ejection fraction in the 25-35% range, we will  consider him for an ICD.     Bruce Elvera Lennox Juanda Chance, MD, [facility]  Electronically Signed    BRB/MedQ  DD: 07/15/2007  DT: 07/16/2007  Job #: [id number]

## 2010-08-20 NOTE — Assessment & Plan Note (Signed)
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  He had  fairly significant congestive heart failure, but after bypass surgery,  his EF improved initially though most recently his EF was found to be  30% by 2-D echo.  He has class II heart failure currently and his  hypertension is well controlled.  He is also diabetic and this has been  fairly poorly controlled in the past.  He has dyslipidemia.  The patient  has never had frank syncope.  He denies peripheral edema.  He is  referred today by Dr. Juanda Chance for consideration for prophylactic  defibrillator implantation.   MEDICATIONS:  1. Aspirin 81 a day.  2. Crestor 20 a day.  3. Plavix 75 a day.  4. Glipizide 10 a day.  5. Aldactone 25 mg daily.  6. Furosemide 40 a day.  7. Norvasc 10 a day.  8. Metformin 1 g twice daily.   PAST MEDICAL HISTORY:  As noted in the HPI.  He is status post coronary  bypass surgery in 2008.   SOCIAL HISTORY:  The patient is married.  He denies tobacco or ethanol  abuse currently, but smoked and drank heavily in the past.  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   REVIEW OF SYSTEMS:  The patient has dyspnea with exertion.  He has  minimal PND and orthopnea.  He denies peripheral edema.  Otherwise, all  systems reviewed and negative except as noted above.   PHYSICAL EXAMINATION:  GENERAL:  He is a pleasant 76 year old man in no  acute distress.  VITAL SIGNS:  Blood pressure today was 160/90.  The pulse 95 and  regular, respirations were 18.  Weight was 242 pounds.  HEENT:   Normocephalic and atraumatic.  Pupils are equal and round.  The  oropharynx was moist.  Sclerae are anicteric.  NECK:  No jugular venous distention.  There is no thyromegaly.  The  trachea is midline.  The carotids are 2+ and symmetric.  LUNGS:  Clear bilaterally to auscultation.  No wheezes, rales, or  rhonchi are present.  There is no increased work of breathing.  CARDIOVASCULAR:  Regular rate and rhythm.  Normal S1 and S2.  The PMI  was enlarged and laterally displaced.  There was a soft S4 gallop.  There are no obvious murmurs.  ABDOMEN:  Soft and nontender.  There was no organomegaly.  The bowel  sounds were present.  There was no rebound or guarding.  EXTREMITIES:  No cyanosis, clubbing, or edema.  The pulses were 2+ and  symmetric.  NEUROLOGIC:  Alert and oriented x3.  The cranial nerves were intact.  Strength was 5/5 and symmetric.  EKG demonstrates sinus rhythm with prior anterior myocardial infarction.  There are nonspecific ST-T wave abnormalities present.   IMPRESSION:  1. Ischemic cardiomyopathy with ejection fraction of 30%.  2. Congestive heart failure class II.  3. Diabetes.  4. Hypertension.   DISCUSSION:  I have discussed treatment options with [name] [name]  including the risks, benefits, goals, and expectations of prophylactic  defibrillator implantation.  He is considering his options and he will  call us if he would like to proceed with ICD implant.     Doylene Canning. Ladona Ridgel, MD  Electronically Signed    GWT/MedQ  DD: 02/08/2008  DT: 02/09/2008  Job #: [id number]   cc:   [name] Silence, MD

## 2010-08-20 NOTE — Assessment & Plan Note (Signed)
[facility]                            CARDIOLOGY OFFICE NOTE   NAME:Rakers, DELOIS                        MRN:          [id number]  DATE:01/27/2007                            DOB:          [birth year]    PRIMARY CARE PHYSICIAN:  Arta Silence, MD   CLINICAL HISTORY:  Mr. Dolney is 76 years old and returned for follow-up  management of his coronary heart disease.  He was admitted in February  of 2008 with congestive heart failure and non-ST elevation infarction  and underwent bypass surgery for severe three-vessel disease with an  ejection fraction of 15% to 20%.  He has improved over time, and the  last ejection fraction was 35% to 40% by echocardiography in May.  He  says he is doing well recently, with no recent chest pain, shortness of  breath or palpitations.  He went back to [place] last month, after his  mother passed away.   PAST MEDICAL HISTORY:  Significant for diabetes and hyperlipidemia.   CURRENT MEDICATIONS:  Include aspirin, Coreg, lisinopril, Crestor,  Plavix, Glipizide, Spironolactone, potassium, Furosemide, Norvasc and  Metformin.   EXAMINATION:  VITAL SIGNS:  Blood pressure is 115/72 and pulse 86 and  regular.  There was no venous distention.  The carotid pulses were full  without bruits.  CHEST:  Clear.  CARDIAC:  Rhythm was regular.  I hear no murmurs or gallops.  ABDOMEN:  Soft with normal bowel sounds.  There is no  hepatosplenomegaly.  EXTREMITIES:  Peripheral pulses were full.  There is no peripheral  edema.   Electrocardiogram showed sinus rhythm with nonspecific ST-T change.   IMPRESSION:  1. Coronary artery disease status post prior bypass surgery, February      2008.  2. Ischemic cardiomyopathy, ejection fraction of 15% to 20%, improving      to 35% to 40% by echo in May.  3. Hypertension.  Now, finally under good control.  4. Diabetes.  5. Hyperlipidemia.  6. History of some mental alteration following  bypass surgery, now      improved.   RECOMMENDATIONS:  I think Mr. Brigance is doing much better.  We will plan  to continue the same medicine, see him back in 6 months, to get an  echocardiogram prior to that visit.     Bruce Elvera Lennox Juanda Chance, MD, [facility]  Electronically Signed   BRB/MedQ  DD: 01/27/2007  DT: 01/28/2007  Job #: [id number]

## 2010-08-20 NOTE — Assessment & Plan Note (Signed)
[facility]                            CARDIOLOGY OFFICE NOTE   NAME:[name] [name], [name] [name]                        MRN:          [id number]  DATE:11/03/2007                            DOB:          December 21, 1934    PRIMARY CARE PHYSICIAN:  Arta Silence, MD   CLINICAL HISTORY:  [name] [name] is a 76 year old who returned for followup  management with coronary heart disease.  In 2008, he underwent coronary  bypass surgery for three-vessel disease.  He had congestive heart  failure and significant LV dysfunction with ejection fraction of 15-20%,  which improved to 35-40%, but on last echo had gone down to 25-35%.   Recently according to his living girlfriend, he has had increasing  symptoms of fatigue and shortness of breath.  He says he gives out  really easily.  Also, his sugars had been under very poor control with  sugars in the 300s and as high as 400.   PAST MEDICAL HISTORY:  Significant for diabetes, hyperlipidemia, and  hypertension.   CURRENT MEDICATIONS:  1. Aspirin.  2. Crestor.  3. Plavix.  4. Glipizide.  5. Spironolactone 25 mg daily.  6. Potassium 20 mEq daily.  7. Furosemide 40 mg daily.  8. Levemir.  9. Norvasc 10 mg daily.  10.Metformin.  11.Coreg 25 mg b.i.d.  12.Lisinopril 20 mg b.i.d.   PHYSICAL EXAMINATION:  VITAL SIGNS:  Blood pressure is 150/90, pulse 77  and regular.  NECK:  There was no venous tension.  The carotid pulses were full  without bruits.  CHEST:  Clear.  HEART:  Rhythm is regular.  No murmurs, rubs, or gallops.  ABDOMEN:  Soft with normal bowel sounds.  The abdomen is protuberant.  EXTREMITIES:  Peripheral pulses were decreased, but present.  He had  some fungal infection in his toes.   IMPRESSION:  1. Coronary artery status post coronary bypass graft surgery in      February 2008.  2. Ischemic cardiomyopathy, ejection fraction 25-35%.  3. Class II systolic congestive heart failure.  4. Hypertension is  under optimal control.  5. Diabetes is under poor control.  6. Hyperlipidemia.  7. Symptoms of fatigue and dyspnea.   RECOMMENDATIONS:  [name] [name] blood pressure is under optimal control  and this may be contributing some to his symptoms, so the maximum dose  of most of his antihypertensive, but I think we can go up on his Coreg  from 25 b.i.d. to one and half tablet b.i.d.  His diabetes is under poor  control and he is scheduled to see Dr. Hetty Ely, this afternoon to make  adjustments regarding this.  We will get a lipid and liver profile, CBC,  and BMP today.  I told him that we can get his blood pressure under  control and his diabetes under control and lose weight that he should  feel better.  We will get an echocardiogram in 3 months followed by an  office visit.  His ejection fraction remains below 35% and we will  consider him for an ICD.     Bruce Elvera Lennox Juanda Chance,  MD, [facility]  Electronically Signed    BRB/MedQ  DD: 11/03/2007  DT: 11/04/2007  Job #: [id number]

## 2010-08-20 NOTE — Discharge Summary (Signed)
NAMEKIMANI, HOVIS NO.:  [id number]   MEDICAL RECORD NO.:  [id number]          PATIENT TYPE:  INP   LOCATION:  2014                         FACILITY:  [facility]   PHYSICIAN:  Doylene Canning. Ladona Ridgel, MD    DATE OF BIRTH:  December 20, 1934   DATE OF ADMISSION:  04/04/2008  DATE OF DISCHARGE:  04/05/2008                               DISCHARGE SUMMARY   This patient has no known drug allergies.   PRIMARY CARE GIVER:  Arta Silence, MD.   FINAL DIAGNOSES:  1. Discharged on day #1, status post implant of a Medtronic MAXIMO      single-chamber cardioverter-defibrillator.  2. History of non-Q-wave myocardial infarction with subsequent      coronary bypass graft surgery, May 19, 2006.  3. Ischemic cardiomyopathy, ejection fraction 30% on echocardiogram,      January 27, 2008.  4. New York Heart Association class II chronic systolic congestive      heart failure.   SECONDARY DIAGNOSES:  1. Diabetes.  2. Dyslipidemia.  3. Hypertension.  4. Also, echocardiogram on January 27, 2008, shows inferior and septal      akinesis.   PROCEDURE:  On April 04, 2008, implant of the Medtronic [facility] single-  chamber cardioverter-defibrillator with defibrillator threshold study,  less than or equal to 15 joules, Dr. Lewayne Bunting.   BRIEF HISTORY:  Mr. Vandermeulen is a 76 year old male.  He has a history of  coronary artery disease.  He has myocardial infarction in the past and  an ischemic cardiomyopathy.  Prior to the bypass surgery, he had fairly  significant congestive heart failure symptoms.  His ejection fraction  improved initially after the surgery, recently was found to be 30% by  echocardiogram on January 27, 2008.  His New York Heart Association  class is II currently.  His hypertension is well controlled.  The  patient has never had frank syncope and has been referred for  consideration of ICD.   Treatment options with this patient have been included, risks, benefits,  and goals and expectations.  These have been discussed with the patient  and his wife.  The patient wishes to proceed with this and will be  admitted at the first opportunity.   HOSPITAL COURSE:  The patient presents electively on April 04, 2008.  He underwent the implantation of a single-chamber cardioverter-  defibrillator by Dr. Ladona Ridgel.  He is ready for discharge on postprocedure  day #1.  He has no hematoma.  His ICD pocket is without swelling.  He  has no pain at the incision site.  He is achieving 94% oxygen  saturations on room air.   He will be discharged on his preoperative medications.  They include:  1. Plavix 75 mg.  He is to hold off on taking until Saturday, April 08, 2008 and then restart it on Saturday, April 08, 2008.  2. Enteric-coated aspirin 81 mg daily.  3. Crestor 20 mg daily at bedtime.  4. Glipizide 10 mg daily.  5. Spironolactone 25 mg daily.  6. Furosemide 40 mg daily.  7. Levemir Pen 10 units daily.  8. Norvasc 10 mg daily.  9. Metformin 1000 mg twice daily.  10.Coreg 25 mg tablets, one and one-half tablets twice daily.  11.Lisinopril 20 mg twice daily.   FOLLOWUP:  He follows up at [facility], [address] [facility] Clinic, Wednesday, April 19, 2008 at 9:20.  He will see  Dr. Ladona Ridgel on July 25, 2008.  Dr.  Lubertha Basque office will call with that  appointment.   Inspection of the chest x-ray after the device was implanted shows that  the lead is in appropriate position.  Interrogation of the device by the  Medtronic representative shows the value is within normal limits.   LABORATORY STUDIES:  Pertinent to this admission were drawn, March 29, 2008, they include sodium 137, potassium 4.5, chloride 101,  carbonate 28, glucose is 182, BUN is 17, and creatinine 1.1.  White  cells are 5.4, hemoglobin 13.8, hematocrit 40.8, and platelets are 188.  The protime is 10.2 and INR is 0.9.      Maple Mirza, PA      Doylene Canning. Ladona Ridgel, MD  Electronically Signed    GM/MEDQ  D:  04/05/2008  T:  04/06/2008  Job:  [id number]   cc:   Arta Silence, MD

## 2010-08-20 NOTE — Assessment & Plan Note (Signed)
[facility]                            CARDIOLOGY OFFICE NOTE   NAME:Lightle, GLEN                        MRN:          [id number]  DATE:01/31/2008                            DOB:          04-05-1935    PRIMARY CARE PHYSICIAN:  Arta Silence, MD   CLINICAL HISTORY:  Mr. Dolecki is 76 years old and returned for followup  management of his coronary heart disease and congestive heart failure.  In 2008, he underwent coronary bypass surgery for three-vessel disease.  He presented with LV dysfunction with ejection fraction of 15-20% and  congestive heart failure.  He has improved since that time and recently  has been doing somewhat better without any chest pain, shortness breath,  or palpitations.  He has had LV dysfunction and his ejection fraction  has been in the 25-35% range.  We did an echo just prior to this visit  which showed an ejection fraction of 30-35%.   He does get short of breath with moderate exertion, but this has been  somewhat better and he does not had any fluid accumulation.   PAST MEDICAL HISTORY:  Significant for diabetes, hyperlipidemia and  hypertension.   CURRENT MEDICATIONS:  1. Aspirin.  2. Crestor 20 mg daily.  3. Plavix.  4. Glipizide.  5. Spironolactone 25 mg daily.  6. K-Lor 20 mEq daily.  7. Furosemide 40 mg daily.  8. Levemir for diabetes.  9. Norvasc 10 mg daily.  10.Metformin 1000 mg b.i.d.  11.Coreg 25 mg b.i.d. 1-1/2 tablets b.i.d.  12.Lisinopril 20 mg b.i.d.   PHYSICAL EXAMINATION:  VITAL SIGNS:  The blood pressure is 131/75 and  pulse 78 and regular.  NECK:  There was no venous distension.  The carotid pulses were full  without bruits.  CHEST:  Clear.  CARDIAC:  Rhythm was regular.  I could hear no murmurs or gallops.  ABDOMEN:  Soft with normal bowel sounds.  There is no  hepatosplenomegaly.  EXTREMITIES;  There is no peripheral edema.  Pedal pulses are equal.   IMPRESSION:  1. Coronary artery  disease status post coronary bypass graft surgery      in February 2008.  2. Ischemic cardiomyopathy with ejection fraction of 30-35%.  3. Class II systolic heart failure.  4. Hypertension.  5. Diabetes.  6. Hyperlipidemia.   RECOMMENDATIONS:  I think, Ms. Aizen is doing much better.  His weight  is up and he is working hard to try and reduce this and may go on the  Nutri-Slim diet.  His ejection fraction is still depressed and I think  he is a candidate for an ICD.  I discussed this with him and his  girlfriend today.  He is not totally certain whether he wants to have  this done, but he is willing to consider it.  He is agreeable to talking  to our electrophysiologist about this possibility and I will arrange for  him to see Dr. Ladona Ridgel for consultation.  We will continue his medicines  the same and I will plan to see him back in followup in 3  months.     Bruce Elvera Lennox Juanda Chance, MD, [facility]  Electronically Signed    BRB/MedQ  DD: 01/31/2008  DT: 02/01/2008  Job #: [id number]

## 2010-08-20 NOTE — Progress Notes (Signed)
[place] [facility]                  [facility]' OFFICE NOTE   NAME:[name], [name]                        MRN:          [id number]  DATE:08/07/2008                            DOB:          17-Mar-1935    HISTORY:  [name] [name] returns today for followup.  He is a very pleasant  male with an ischemic cardiomyopathy, class I to II congestive heart  failure status post MI, who is status post ICD insertion back in  December 2009, he returns today for followup.  He has done well in the  interim.  He has been working on weight loss and actually lost 10 pounds  since last we saw him.  He denies chest pain.  He denies shortness of  breath.  He denies peripheral edema.  He has had no intercurrent ICD  therapies.   MEDICINES:  1. Plavix 75 a day.  2. Lasix 40 a day.  3. Lisinopril 20 twice a day.  4. Crestor 20 a day.  5. Metformin 1 g twice daily.  6. Aspirin 81 a day.  7. Aldactone 25 a day.  8. Potassium 20 a day.  9. Amlodipine 10 a day.  10.Carvedilol 25 mg 1 and 1/2 tablets twice daily.  11.Ambien 10 mg daily.   PHYSICAL EXAMINATION:  GENERAL:  He is a pleasant, well-appearing man in  no acute distress.  VITAL SIGNS:  Blood pressure today was 160/90 (the patient did not take  his medicines this morning) pulse is 86 and regular, and respirations  are 18.  NECK:  No jugular venous distention.  LUNGS:  Clear bilaterally to auscultation.  No wheezes, rales, or  rhonchi are present.  There is no increased work of breathing.  CARDIOVASCULAR:  Regular rate and rhythm.  Normal S1 and S2.  PMI was  enlarged and laterally displaced.  ABDOMEN:  Soft and nontender.  There is no organomegaly.  EXTREMITIES:  No edema.   Interrogation of his defibrillator demonstrates a Medtronic single  chamber device.  The R-waves were 19.  The impedance 494 with the  threshold 1 volt at 0.4.  Battery voltage was 3.2 volts.  Underlying  rhythm was sinus at  86.   IMPRESSION:  1. Ischemic cardiomyopathy.  2. Congestive heart failure class II with an ejection fraction of 25%.  3. Status post implantable cardioverter-defibrillator insertion.   DISCUSSION:  [name] [name] is stable.  His defibrillator is working  normally.  We will have him back for followup in our clinic in 3-4  months and I will see him back in a year.     Doylene Canning. Ladona Ridgel, MD  Electronically Signed    GWT/MedQ  DD: 08/07/2008  DT: 08/08/2008  Job #: [id number]

## 2010-08-20 NOTE — Op Note (Signed)
NAMEBRADFORD, CAZIER NO.:  [id number]   MEDICAL RECORD NO.:  [id number]          PATIENT TYPE:  INP   LOCATION:  2014                         FACILITY:  [facility]   PHYSICIAN:  Doylene Canning. Ladona Ridgel, MD    DATE OF BIRTH:  10/20/1934   DATE OF PROCEDURE:  04/04/2008  DATE OF DISCHARGE:                               OPERATIVE REPORT   PROCEDURE PERFORMED:  Implantation of a single chamber defibrillator.   INDICATIONS:  Ischemic cardiomyopathy status post MI with EF of 30%.   INTRODUCTION:  The patient is a very pleasant 76 year old male with long-  standing heart disease status post bypass surgery.  He has ischemic  cardiomyopathy, prior MI, EF of 30%.  He is referred now for  prophylactic ICD insertion.   PROCEDURE:  After informed consent was obtained, the patient was taken  to the Diagnostic EP Lab in a fasting state.  After usual preparation  and draping, intravenous fentanyl and midazolam was given for sedation.  A 30 mL of lidocaine was infiltrated into the left infraclavicular  region.  A 7-cm incision was carried out over this region.  Electrocautery was utilized to dissect down to the fascial plane.  The  left subclavian vein was punctured after 10 mL of contrast was injected  into it demonstrating it to be patent.  The Medtronic model [id number], 65-cm  active fixation defibrillation lead, serial [id number] was advanced  into the right ventricle.  Mapping was carried out at the final site on  the RV septum.  The R-waves were 23 millivolts.  The pacing impedance  with the lead actively fixed was 700 ohms and the threshold 0.8 volts at  0.5 milliseconds.  There was a large injury currently present with the  lead actively fixed.  A 10-volt pacing did not stimulate the diaphragm.  With the defibrillator lead in satisfactory position, it was secured to  the subpectoralis fascia with a figure-of-eight silk suture.  The sewing  sleeve was also secured with silk suture.   Electrocautery was then  utilized to make a subcutaneous pocket.  Kanamycin irrigation was  utilized to irrigate the pocket and electrocautery was utilized to  assure hemostasis.  The Medtronic Maximo II VR single chamber  defibrillator, serial [id number] was connected to the defibrillation  lead and placed back in the subcutaneous pocket.  The pocket was  irrigated with kanamycin.  Defibrillation threshold testing was carried  out.   After the patient was more deeply sedated with fentanyl and Versed, VF  was induced with a T-wave shock.  A 15-joule shock was subsequently  delivered which terminated VF and restored sinus rhythm.  At this point,  no additional defibrillation threshold testing was carried out and the  incision closed with 2-0 Vicryl and 3-0 Vicryl.  Benzoin was painted on  the skin, Steri-Strips were applied, and pressure dressing was placed,  and the patient was returned to his room in satisfactory condition.   COMPLICATIONS:  There were no immediate procedure complications.   RESULTS:  This demonstrates successful implantation of a prophylactic  single chamber defibrillator in a  patient with  ischemic cardiomyopathy  status post myocardial infarction, EF 30%.      Doylene Canning. Ladona Ridgel, MD  Electronically Signed     GWT/MEDQ  D:  04/04/2008  T:  04/04/2008  Job:  [id number]   cc:   Everardo Beals. Juanda Chance, MD, [facility]

## 2010-08-23 NOTE — Op Note (Signed)
[name] [name], [name] [name]               ACCOUNT NO.:  [id number]   MEDICAL RECORD NO.:  [id number]          PATIENT TYPE:  INP   LOCATION:  2312                         FACILITY:  [facility]   PHYSICIAN:  Salvatore Decent. Cornelius Moras, M.D. DATE OF BIRTH:  November 24, 1934   DATE OF PROCEDURE:  05/19/2006  DATE OF DISCHARGE:                               OPERATIVE REPORT   PREOPERATIVE DIAGNOSIS:  Severe three-vessel coronary artery disease  with ischemic cardiomyopathy, class IV congestive heart failure, status  post acute non-Q-wave myocardial infarction.   POSTOPERATIVE DIAGNOSIS:  Severe three-vessel coronary artery disease  with ischemic cardiomyopathy, class IV congestive heart failure, status  post acute non-Q-wave myocardial infarction.   PROCEDURE:  Median sternotomy for coronary artery bypass grafting x3  (left internal mammary artery to distal left anterior descending  coronary artery, saphenous vein graft to second circumflex marginal  branch, saphenous vein graft to posterior descending coronary artery,  endoscopic saphenous vein harvest from right thigh and right lower leg).   SURGEON:  Dr. Purcell Nails.   ASSISTANT:  Dr. Kathlee Nations Trigt.   SECOND ASSISTANT:  Zadie Rhine, PA-C   ANESTHESIA:  General.   BRIEF CLINICAL NOTE:  The patient is a 76 year old male with no previous  history of coronary artery disease but risk factors notable for history  of hypertension, hyperlipidemia, type 2 diabetes mellitus, and tobacco  abuse.  The patient presents with class IV congestive heart failure and  flash pulmonary edema.  He ruled in for an acute myocardial infarction  by serial cardiac enzymes.  Pulmonary edema are resolved rapidly with  diuretic management.  The patient underwent elective left and right  heart catheterization by Dr. Charlies Constable.  He was found to have severe  three-vessel coronary artery disease with severe left ventricular  dysfunction.  A full consultation has been  dictated previously.  The  patient has been counseled at length regarding the indications, risks,  and potential benefits of surgery.  Alternative treatment strategies  have been discussed.  He understands and accepts all associated risks  and desires to proceed as described.   OPERATIVE FINDINGS:  1. Moderate left ventricular hypertrophy and dilatation.  2. Severe left ventricular dysfunction with ejection fraction      estimated less than 20%.  3. Diffuse scarring throughout the inferior and lateral wall      consistent with multiple previous myocardial infarctions.  4. Good-quality left internal mammary artery and saphenous vein      conduit.  5. Good-quality target vessels for grafting.   OPERATIVE NOTE IN DETAIL:  The patient was brought to the operating room  on the above-mentioned date and central monitoring was established by  the anesthesia service under the care and direction of Dr. Sheldon Silvan.  Specifically, a Swan-Ganz catheter is placed through the right internal  jugular approach.  A radial arterial line is placed.  Intravenous  antibiotics were administered.  Following induction with general  endotracheal anesthesia, a Foley catheter is placed.  The patient's  chest, abdomen, both groins, and both lower extremities are prepared,  draped in sterile manner.  Baseline transesophageal echocardiogram was performed by Dr. Ivin Booty.  This demonstrates dilated left ventricle with severe left ventricular  dysfunction.  There are no particular segmental wall motion  abnormalities but rather prominent global hypokinesis.  There is no  mitral regurgitation.  The aortic valve appears normal.  No other  abnormalities are noted.   A median sternotomy incision is performed and the left internal mammary  artery is dissected from the chest wall and prepared for bypass  grafting.  The left internal mammary artery is good-quality conduit.  Simultaneously saphenous vein is obtained from  the patient's right thigh  and the upper portion of the right lower leg using endoscopic vein  harvest technique.  The saphenous vein is good-quality conduit.  After  the saphenous vein is removed, the small surgical incisions in the right  lower extremity are closed in multiple layers with running absorbable  suture.  The patient is heparinized systemically.  The left internal  mammary artery is transected distally and is noted to have excellent  flow.   The pericardium is opened.  The ascending aorta is mildly dilated but is  otherwise soft and free of any palpable plaque or calcification.  The  ascending aorta and the right atrium were cannulated for cardiopulmonary  bypass.  A retrograde cardioplegic catheter is placed through the right  atrium into the coronary sinus.  Temperature probe is placed left  ventricular septum.  A cardioplegic catheter is placed in the ascending  aorta.   The patient is allowed to cool passively to 32 degrees systemic  temperature.  The aortic crossclamp was applied and cold blood  cardioplegia is administered initially in antegrade fashion through the  aortic root.  Iced saline slush was applied for topical hypothermia.  Supplemental cardioplegia is administered retrograde through the  coronary sinus catheter.  The initial cardioplegic arrest and myocardial  cooling are felt to be excellent.  Repeat doses of cardioplegia are  administered intermittently throughout the crossclamp portion of the  operation through the aortic root, down subsequently placed vein grafts,  and retrograde through the coronary sinus catheter to maintain left  ventricular septal temperature below 15 degrees centigrade.   The following distal coronary anastomoses were performed:  1. The posterior descending coronary artery is grafted with a      saphenous vein graft in end-to-side fashion.  This vessel measured      1.5 mm in diameter and is a fair-quality target vessel at the  site     of distal grafting.  It is diffusely diseased and chronically      occluded proximally.  There is moderate amount of plaque in the      distal vessel as well.  2. Second circumflex marginal branch is grafted with a saphenous vein      graft in end-to-side fashion.  This vessel measured 1.8 mm in      diameter and is a fair to good quality target vessel at the site of      distal grafting.  3. The distal left anterior descending coronary artery is grafted with      left internal mammary artery in end-to-side fashion.  This vessel      measures 2.0 mm in diameter and is a good-quality target vessel for      grafting.   Both proximal saphenous vein anastomoses were performed directly to the  ascending aorta prior to removal of the aortic crossclamp.  The left  ventricular septal temperature rises rapidly  with reperfusion of the  left internal mammary artery.  One final dose of warm retrograde hot  shot cardioplegia is administered.  The aortic crossclamp was removed  after total crossclamp time of 64 minutes.  Heart began to beat  spontaneously without need for cardioversion.  All proximal and distal  coronary anastomoses were inspected for hemostasis and appropriate graft  orientation.  Epicardial pacing wires were fixed to the right  ventricular outflow tract into the right atrial appendage.  The patient  is rewarmed to 37 degrees centigrade temperature.  Low-dose milrinone  infusion and dopamine infusions are begun.  The patient weaned from  cardiopulmonary bypass without difficulty.  The patient's rhythm at  separation from bypass is normal sinus rhythm.  Total cardiopulmonary  bypass time the operation is 114 minutes.  Follow-up transesophageal  echocardiogram performed by Dr. Ivin Booty after separation from bypass  demonstrates no significant changes with perhaps slightly improved left  ventricular function.   The venous and arterial cannulae are removed uneventfully.   Protamine is  administered to reverse anticoagulation.  The mediastinum and left chest  are irrigated with saline solution containing vancomycin.  Meticulous  surgical hemostasis ascertained.  The mediastinum and left chest are  drained with three chest tubes exited through separate stab incisions  inferiorly.  The soft tissues anterior to the aorta are reapproximated  loosely.  The sternum was closed with double-strength sternal wire.  The  soft tissues anterior to the sternum are closed in multiple layers and  the skin is closed with a running subcuticular skin closure.   The patient tolerated the procedure well and was transported to the  surgical intensive care unit in stable condition.  There are no  intraoperative complications.  All sponge, instrument and needle counts  are verified correct at completion of the operation.  No blood products  were administered.      Salvatore Decent. Cornelius Moras, M.D.  Electronically Signed     CHO/MEDQ  D:  05/19/2006  T:  05/19/2006  Job:  [id number]  cc:   Everardo Beals. Juanda Chance, MD, [facility]  Arta Silence, MD

## 2010-08-23 NOTE — Assessment & Plan Note (Signed)
[facility]                            CARDIOLOGY OFFICE NOTE   NAME:[name] [name]                        MRN:          [id number]  DATE:06/29/2006                            DOB:          [birth year]    PRIMARY CARE PHYSICIAN:  Dr. Verdell Face.   CLINICAL HISTORY:  [name] is 76 years old and presented in early  February with pulmonary edema and non-ST elevation infarction. He  underwent catheterization, was found to have 3 vessel disease and  ejection fraction of 15% to 20%. He underwent bypass surgery by Dr.  Tressie Stalker.   He has done reasonably well since that time. He has had no recent chest  pain, or palpitations, and no shortness of breath at his current level  of activity. He has had some mental status changes where he seems to  stare at things and make inappropriate comments. He was seen by Dr.  Lissa Morales and she recommended a CT or MR but he wanted to defer this for a  few weeks to see if he got better.   He has not yet entered the rehab program in [place].   PAST MEDICAL HISTORY:  Significant for recently diagnosed diabetes,  hyperlipidemia, hypertension.   CURRENT MEDICATIONS:  Include; Aspirin, Coreg, Lipitor, Plavix,  Glipizide, Spirolactone, Potassium, Furosemide, Metformin, Lisinopril.   On examination today, his blood pressure is 174/80, and the pulse 91 and  regular. There was venous distension. The carotid pulses were full.  CHEST: Clear without rales or rhonchi.  CARDIAC: Rhythm was regular. I could hear no murmurs or gallops. The  first and second heart sounds were normal.  ABDOMEN: Soft with normal bowel sounds. There was no hepatosplenomegaly.  The peripheral pulses were full and there is no peripheral edema.   IMPRESSION:  1. Coronary artery disease with recent non-ST elevation infarction and      pulmonary edema and status post coronary graft bypass surgery for      severe 3 vessel disease in early February  2008.  2. Ischemic cardiomyopathy with ejection fraction of 15% to 20% by      recent echo.  3. Hypertension.  4. Diabetes.  5. Hyperlipidemia.  6. Abnormal mental status with periods or disorientation staring.  7. Congestive heart failure, related to systolic dysfunction, class 2.   RECOMMENDATIONS:  I think from a cardiac stand point Mr. Bechler is  stable although his left ventricular function has not recovered much at  all. His blood pressure is quite high today although he did not take his  medicines this morning. We will increase his Coreg from 12.5 b.i.d. to  one and a half tablets b.i.d. and we will increase his lisinopril from  10 b.i.d. to 20 in the morning and 10 in the evening. We will get a BMP  today and next we will get a BMP, BNP and a fasting liver and lipid  profile. I recommended that he follow through in getting in the  rehabilitation program at [facility] and recommend that if his mental  status is not better that he get the  MR or CT as suggested by Dr.  Lissa Morales. I will plan to see him back in about 5 weeks. At 3 months we  will need to reevaluate his left ventricular function and if he has not  recovered consider him for an ICD. He has a narrow QRS, so he is  probably not a candidate for a biventricular pacer.     Bruce Elvera Lennox Juanda Chance, MD, [facility]  Electronically Signed    BRB/MedQ  DD: 06/29/2006  DT: 06/29/2006  Job #: [id number]   cc:   Arta Silence, MD

## 2010-08-23 NOTE — Cardiovascular Report (Signed)
NAMEHARVEST, STANCO               ACCOUNT NO.:  [id number]   MEDICAL RECORD NO.:  [id number]          PATIENT TYPE:  INP   LOCATION:  2909                         FACILITY:  [facility]   PHYSICIAN:  Bruce R. Juanda Chance, MD, FACCDATE OF BIRTH:  1934/04/10   DATE OF PROCEDURE:  05/15/2006  DATE OF DISCHARGE:                            CARDIAC CATHETERIZATION   PRIMARY CARE PHYSICIAN:  Dr. Verdell Face.   CLINICAL HISTORY:  Mr. Haydel is 76 years old and has no prior history  of known heart disease.  He does have a history of hypertension and  hyperlipidemia and diabetes.  He was admitted to the hospital with  shortness of breath and felt to be in pulmonary edema.  He also had  positive enzymes consistent with a non-ST-elevation infarction.  His ECG  showed LVH with ST-T changes. He was scheduled today for right and left  heart catheterization and angiography.   PROCEDURE:  The procedure was performed via the right femoral artery  using arterial sheath and 6-French preformed coronary catheters.  A  front wall arterial puncture was performed and Omnipaque contrast was  used.  The patient tolerated procedure well and left the laboratory in  satisfactory condition.  The right femoral artery was not suitable for  closure with Angio-Seal.   RESULTS:  The left main coronary artery is free of significant disease.   Left anterior descending artery gave rise to three diagonal branches and  two septal perforators.  The LAD was heavily calcified.  There was 80%  narrowing in the proximal LAD and 70% narrowing in the mid LAD.   The circumflex artery is a moderate-sized vessel that gave rise to a  ramus branch, an atrial branch, a marginal branch and three  posterolateral branches.  There was 80-90% narrowing in the midportion  of the circumflex artery.   The right coronary artery had a 99% stenosis in the midportion of the  vessel with TIMI-1 flow distally.  Distal right coronary filled via  collaterals from the left coronary artery.   The left ventriculogram performed in RAO projection showed global  hypokinesis with an estimated fraction of 20%.  The inferior wall was  perhaps more hypokinetic than the rest of the ventricle.   Subclavian injection showed the internal mammary artery to be patent.   HEMODYNAMIC DATA:  The right atrial pressure was 8 mean.  The pulmonary  artery pressure was 42/24 with a mean of 34.  The pulmonary wedge  pressure was 17 and rose to 19 during the procedure.  Left ventricular  pressure was 119/33 and aortic pressure was 119/69.  The cardiac  output/cardiac index was 5.0/2.3 meters/minute/meters squared by Fick.  Pulmonary artery saturation was 59% and the aortic saturation was 92%.   CONCLUSION:  1. Recent non-ST-elevation myocardial infarction and acute pulmonary      edema.  2. Severe three-vessel coronary artery disease with 80 and 70%      stenoses in the mid left anterior descending artery, 80-90%      stenosis in the mid circumflex artery, and 99% stenosis in the mid  right coronary artery with TIMI-1 flow distally.  3. Severe left ventricular dysfunction with an estimated fraction of      15-20%.   RECOMMENDATIONS:  The patient has severe three-vessel disease and severe  left ventricular dysfunction.  I think the best option for  revascularization is surgical revascularization, although he will be at  some increased risk due to his poor LV function.  I will plan to obtain  a CVTS consult.      Bruce Elvera Lennox Juanda Chance, MD, [facility]  Electronically Signed     BRB/MEDQ  D:  05/15/2006  T:  05/15/2006  Job:  [id number]

## 2010-08-23 NOTE — Assessment & Plan Note (Signed)
[facility]                            CARDIOLOGY OFFICE NOTE   NAME:Thursby, SHAYDE                        MRN:          [id number]  DATE:06/02/2006                            DOB:          1935-02-08    PRIMARY:  Dr. Hetty Ely, cardiologist is Dr. Charlies Constable.   This is a very pleasant 76 year old white male patient who presented to  the hospital with flash pulmonary edema and positive enzymes for an MI.  He underwent cardiac cath by Dr. Juanda Chance, and was found to have severe LV  dysfunction, ejection fraction 15-20% with severe 3-vessel disease.  He  had CABG x3 by Dr. Cornelius Moras February 12 with a LIMA to the LAD, SVG to the  second marginal, SVG to the PDA.  The patient did pretty well  postoperatively and is here for his post-op visit.  His blood pressure  is elevated today.  He inadvertently has not been taking his lisinopril  10 mg b.i.d.  He also ran out of his Lasix and potassium yesterday.  His  sugars have also been running high at home in the 150 range, and was 180  this morning.  He has 2 medications from Dr. Hetty Ely for diabetes,  which he did not bring with him today and is going to call his office to  discuss what he needs to be on.  He also complains of feeling like he is  not supposed to be in a certain place at a certain time.  He says his  head feels fogged and his friend describes him wanting to do some odd  things, such as going to [employer] and looking for telephones the day he  came home from bypass surgery from the hospital.  He occasionally will  just sit in his car and stare.  He is not taking any pain medications  for fear of addiction.   CURRENT MEDICATIONS:  1. Aspirin 81 mg daily.  2. Coreg 12.5 mg b.i.d.  3. Lipitor 40 mg nightly.  4. Plavix 75 mg daily.  5. Glipizide 10 mg daily.  6. Spironolactone daily.  7. He was on Lasix 40 and KCl 20, ran out yesterday.  8. One of the lisinopril has hydrochlorothiazide in it that he  was      taking at home prior to surgery.   PHYSICAL EXAM:  This is a pleasant 76 year old white male in no acute  distress.  Blood pressure is 180/78, pulse 96, weight 225.  NECK:  Without JVD, eschar, bruit, or thyroid enlargement.  LUNGS:  Decreased at the bases, but clear anterior, posterior, and  lateral.  HEART:  Regular rate and rhythm at 90 beats per minute.  Normal S1 and  S2.  Positive S4.  No significant murmur, rub, bruit, thrill, or heave  noted.  Incision healing well.  ABDOMEN:  Obese, normoactive bowel sounds heard throughout.  EXTREMITIES:  Without cyanosis, clubbing, or edema.  He has good distal  pulses.   IMPRESSION:  1. Coronary artery disease status post coronary artery bypass grafting      x3 with a  left internal mammary artery to the left anterior      descending, saphenous vein graft to the second obtuse marginal, and      saphenous vein graft to the posterior descending artery on May 19, 2006.  2. Ischemic cardiomyopathy, ejection fraction 15-20%.  3. Hypertension.  4. Diabetes mellitus.  5. Hyperlipidemia.   PLAN:  I have given his prescriptions for Lasix and potassium with his  severe left ventricular dysfunction.  I have also given him a  prescription for lisinopril 10 mg b.i.d. and asked him to restart this.  I asked him not to take the dose with hydrochlorothiazide at home when  he is on the Lasix.  He is to call Dr. Lorenza Chick office with his  diabetes medications, and discuss which he should be on.  We will also  check a BMET for potassium and renal functions, and we will refer him to  our neurologist because of his post-op symptoms.  I have scheduled a 2D  echo in 1 month and follow up with Dr. Juanda Chance after that in the hopes of  improved left ventricular function.      Jacolyn Reedy, PA-C  Electronically Signed      Rollene Rotunda, MD, [facility]  Electronically Signed   ML/MedQ  DD: 06/02/2006  DT: 06/02/2006  Job #: [id number]

## 2010-08-23 NOTE — H&P (Signed)
NAME:  [name] [name], [name] [name]               ACCOUNT NO.:  [id number]   MEDICAL RECORD NO.:  [id number]          PATIENT TYPE:  EMS   LOCATION:  MAJO                         FACILITY:  [facility]   PHYSICIAN:  Leonard Downing. Pernell Dupre, MD    DATE OF BIRTH:  01-Feb-1935   DATE OF ADMISSION:  05/14/2006  DATE OF DISCHARGE:                              HISTORY & PHYSICAL   CHIEF COMPLAINT:  Shortness of breath.   PRIMARY MD:  Laurita Quint, MD   HISTORY OF PRESENT ILLNESS:  This is a 76 year old Caucasian male with a  history of diabetes, hypertension, hypercholesterolemia, who presents  for shortness of breath.  The patient states at 12:30 a.m. he became  acutely short of breath while in bed.  The patient denies any chest  pain, no orthopnea, no PND, no lower extremity edema, no nausea,  vomiting, or diaphoresis.  No fevers, chills, or night sweats.  No  lumps, bumps, or rashes.  No coughing up blood.  No blood in urine.  No  blood in stool.  He denies any other complaints, other than the  shortness of breath.   ALLERGIES:  NO KNOWN DRUG ALLERGIES.   MEDICATIONS:  Zocor and lisinopril.   PAST MEDICAL HISTORY:  1. Diabetes in which he says he takes no medicines for.  2. Hypertension.  3. Hypercholesterolemia.   SOCIAL HISTORY:  Lives in [place] by himself.  Owned a company  called __________.  He previously smoked a half pack per day x35 years,  quit about 6 weeks ago.  Occasional alcohol.   FAMILY HISTORY:  Mother is alive at 37, has heart failure.  Father died  at 39 from a heart attack.   REVIEW OF SYSTEMS:  Only remarkable for shortness of breath.  Otherwise  all other systems were negative.   PHYSICAL EXAMINATION:  VITALS:  Blood pressure 146/87, pulse 88,  temperature 98.7.  GENERAL:  Alert and oriented x3.  Wearing CPAP.  No increased JVP.  No  carotid bruits.  No thyromegaly.  CARDIOVASCULAR:  Normal S1 and S2.  Questionable systolic ejection  murmur left lower sternal border.  Is  hard to hear because of the CPAP.  LUNGS:  Decreased breath sounds at the bases.  ABDOMEN:  Soft, nontender, nondistended.  Positive bowel sounds.  EXTREMITIES:  No clubbing, cyanosis, or edema.  Strong pulses.   Chest x-ray showing cardiomegaly and bilateral infiltrates, consistent  with flash pulmonary edema.  EKG May 14, 2006, 156, rate 116.  Sinus tachycardia.  Axis 6, DPR of 192, QRS 117, QTC of 440.  He had 1-  mm ST depression at 2, 3, AVF; 1-mm ST depression in V4; and 2-mm ST  elevation in V5, V6.  LVH repolarization abnormalities, 1-mm ST  elevation in V1 through V3.  Repeat EKG done by me at 3:47 on May 14, 2006, showing sinus rhythm at 87, left atrial enlargement, LVH with  repolarization abnormalities.  Otherwise ST changes have resolved.   LABORATORY DATA:  White count 12, H&H 17/49, platelets 259, sodium 136,  potassium 3.7, BUN of 16, creatinine 1.1, glucose  311.  His CK-MB has  increased from 5.8 to 8.7.  Troponin I from 0.05 to 0.09.  His ABG 7.26,  pCO2 43, bicarbonate 21.  His sodium 136, potassium 3.7, BUN of 16,  creatinine 1.1, glucose of 311.   ASSESSMENT AND PLAN:  This is a 76 year old gentleman with a history of  diabetes, hypertension, hypercholesterolemia, presents with shortness of  breath with what looks to be flash pulmonary edema.  1. The first issue we are addressing is the shortness of breath and      non-ST elevation myocardial infarction.  Continue rule out with 3      sets of cardiac enzymes, place in CCU, place on aspirin 325 a day,      heparin drip, and will go ahead and start Integrilin.  Lisinopril 5      a day, Lasix 20 mg IV q.6, place him on a statin.  Hold the beta-      blocker currently because of his decompensated state.  Check a BNP.      Continue CPAP for diuresis.  Check a transthoracic echo and plan      for heart catheterization in the a.m.  2. Diabetes mellitus.  Check a hemoglobin A1c, place on sliding scale       insulin.  3. Hypertension.  Place on lisinopril and Lasix.  4. Hypercholesterolemia.  Check the lipid panel.  Place on a statin.      Check a TSH.  5. He is full code.      Greggory Stallion L. Pernell Dupre, MD     GLA/MEDQ  D:  05/14/2006  T:  05/14/2006  Job:  [id number]

## 2010-08-23 NOTE — Op Note (Signed)
[name] [name], [name] [name] NO.:  [id number]   MEDICAL RECORD NO.:  [id number]          PATIENT TYPE:  INP   LOCATION:  2312                         FACILITY:  [facility]   PHYSICIAN:  Sheldon Silvan, M.D.      DATE OF BIRTH:  [birth year]   DATE OF PROCEDURE:  05/19/2006  DATE OF DISCHARGE:                               OPERATIVE REPORT   PROCEDURE PERFORMED:  Intraoperative transesophageal echocardiography  (TEE).   INDICATIONS FOR PROCEDURE:  [name] was brought to the operating room  today by Dr. Cornelius Moras for coronary artery bypass grafting.  It was felt that  his ejection fraction was between 15 and 20% based on previous  catheterization and echo data, and that he would benefit from a TEE  intraoperatively due to these conditions, both for diagnostic and  monitoring purposes.  I spoke with him preoperatively on May 18, 2006 and ascertained that he had never had difficulty with his  esophagus, either in swallowing or vomiting or bleeding disorders.  He  understood that we would place an ultrasound probe in his esophagus to  look at his heart and understood the need for this.   DESCRIPTION OF PROCEDURE:  After moderate sedation, invasive monitors  including an arterial catheter and a pulmonary artery catheter were  placed in the holding area.  He was taken to the operating room and  anesthetized.  His trachea was intubated without difficulty.   The [employer] TEE probe was sheathed and lubricated  appropriately.  Dr. Jacklynn Bue inserted the probe through the mouth for me  and passed it into the esophagus.  The heart was imaged.   The left ventricle appeared to be have global hypokinesis with an  estimated ejection fraction that was low, i.e., less than 25%.  There  was no sign of thrombus or aneurysm.   The aortic valve was imaged and was tricuspid,with very little sclerosis  on the cusps.  In the long axis view, there was no sign of regurgitation  or  stenosis on color flow exam.   The  interatrial septum was examined and there was no sign of a PFO.   The mitral valve was imaged and the leaflets moved appropriately.  There  was no regurgitation noted on color flow exam in the long axis.   The right ventricle appeared normal and the tricuspid valve moved  normally with only trace regurgitation noted.   The patient was placed on cardiopulmonary bypass by Dr. Cornelius Moras and  completion of the bypass procedure was performed.   On weaning from bypass, the left ventricle was followed and noted to be  contracting quite a bit better than prior to bypass.  It appeared the  ejection fraction would be in the range of 35-40% with vigorous  contraction noted.  There was remaining a slight decrease in contraction  in the posterior wall.  The patient did well post bypass and the probe  was removed without difficulty prior to transporting to the SICU.      Sheldon Silvan, M.D.  Electronically Signed     DC/MEDQ  D:  05/19/2006  T:  05/20/2006  Job:  [id number]

## 2010-08-23 NOTE — Discharge Summary (Signed)
NAMESHIVANK, PINEDO               ACCOUNT NO.:  [id number]   MEDICAL RECORD NO.:  [id number]          PATIENT TYPE:  INP   LOCATION:  2013                         FACILITY:  [facility]   PHYSICIAN:  Salvatore Decent. Cornelius Moras, M.D. DATE OF BIRTH:  03/24/1935   DATE OF ADMISSION:  05/14/2006  DATE OF DISCHARGE:  05/24/2006                               DISCHARGE SUMMARY   ADDENDUM TO PATIENT'S DISCHARGE SUMMARY DICTATED May 22, 2006.   As stated in discharge summary, patient will be ready for discharge home  postop day five or six.  Patient was discharged to home postop day five,  May 24, 2006, in stable condition.   DISCHARGE MEDICATIONS:  1. Aspirin 81 mg daily.  2. Coreg 12.5 mg b.i.d.  3. Lisinopril 10 mg b.i.d.  4. Lipitor 40 mg at night.  5. Lasix 40 mg daily x10 days.  6. Potassium chloride 20 mEq daily x10 days.  7. Plavix 75 mg daily.  8. Glipizide 10 mg daily.  9. Aldactone 25 mg daily.  10.Oxycodone 5 mg one to two tabs q.4-6 h p.r.n. pain.      Theda Belfast, PA      Salvatore Decent. Cornelius Moras, M.D.  Electronically Signed    KMD/MEDQ  D:  07/01/2006  T:  07/01/2006  Job:  [id number]

## 2010-08-23 NOTE — Assessment & Plan Note (Signed)
[facility]                            CARDIOLOGY OFFICE NOTE   NAME:[name] [name], [name] [name]                        MRN:          [id number]  DATE:06/29/2006                            DOB:          Mar 08, 1935    PRIMARY CARE PHYSICIAN:  Arta Silence, MD.   CLINICAL COURSE:  [name] [name] is 76 years old and was admitted to [facility] early February with acute pulmonary edema and a non-ST  elevation infarction.  He underwent diuresis and then catheterization  and was found to have three vessel disease, and underwent bypass surgery  by Dr. Tressie Stalker.  He has done fairly well since that time, although  he has had difficulty with some memory problems and with altered mental  status.  He saw Dr. Lissa Morales who recommended a CT or MR but he wanted to  wait a little bit longer to see if he got better.  He has had no chest  pain, shortness of breath, or palpitations, although his activity has  been somewhat limited.   PAST MEDICAL HISTORY:  Significant for diabetes and hyperlipidemia.   CURRENT MEDICATIONS:  Aspirin, Coreg, Plavix, glipizide, spironolactone,  potassium, furosemide, metformin, and lisinopril.   PHYSICAL EXAMINATION:  VITAL SIGNS:  Blood pressure 174/80.  Pulse 91  and regular (he had not taken his medicines this morning).  NECK:  There was no venous distention.  Carotid pulses were full without  bruits.  CHEST:  Clear.  CARDIAC:  Rhythm was regular.  Had no murmurs or gallops.  ABDOMEN:  Soft without organomegaly.  EXTREMITIES:  Peripheral pulses were full.  There was no peripheral  edema.   We did an echocardiogram on him just recently.  His ejection fraction  was in the 15 to 20 percent range, which had not changed from his  catheterization.   IMPRESSION:  1. Coronary artery disease status post recent non-ST elevation      infarction and pulmonary edema treated with coronary bypass graft      surgery.  2. Ischemic cardiomyopathy  with ejection fraction of 15 to 20 percent.  3. Hypertension.  4. Diabetes.  5. Hyperlipidemia.  6. Altered mental status.   RECOMMENDATIONS:  [name] [name] appears to be doing fairly well from the  standpoint of his heart although his left ventricular function has not  improved.  We will plan to increase his Coreg from 12.5 twice a day to  one and a half tablets twice a day and increase his lisinopril from 10  twice a day to 20 in the morning and 10 in the evening.  We got a BMP on  him today and next week we will get a BNP, BMP, lipid and liver profile.  I will plan to see him back in about six weeks.  I encouraged him to get  in cardiac rehabilitation and I encouraged him to follow through with  the MR or CT that Dr. Lissa Morales recommended if symptoms have not improved  over the next two weeks.     Bruce Elvera Lennox Juanda Chance, MD, [facility]  Electronically Signed  BRB/MedQ  DD: 06/29/2006  DT: 06/30/2006  Job #: [id number]

## 2010-08-23 NOTE — Discharge Summary (Signed)
NAMENICOLES, SEDLACEK               ACCOUNT NO.:  [id number]   MEDICAL RECORD NO.:  [id number]          PATIENT TYPE:  INP   LOCATION:  2013                         FACILITY:  [facility]   PHYSICIAN:  Salvatore Decent. Cornelius Moras, M.D. DATE OF BIRTH:  1934-06-04   DATE OF ADMISSION:  05/14/2006  DATE OF DISCHARGE:  05/22/2006                               DISCHARGE SUMMARY   PRIMARY DIAGNOSIS:  Severe three-vessel coronary artery disease with  ischemic cardiomyopathy, class IV congestive heart failure, status post  acute non-Q-wave myocardial infarction.   IN-HOSPITAL DIAGNOSES:  1. Acute blood loss anemia postoperatively.  2. Volume overload postoperatively.   SECONDARY DIAGNOSES:  1. Diabetes mellitus.  2. Hypertension.  3. Lipidemia.  4. Status post appendectomy.  5. Tobacco abuse.   OPERATIONS AND PROCEDURES:  1. Cardiac catheterization.  2. Coronary artery bypass grafting x3 using a left internal mammary      artery to distal left anterior descending coronary artery,      saphenous vein graft to second circumflex marginal branch,      saphenous vein graft to posterior descending coronary artery.      Endoscopic saphenous vein harvest from right thigh and right lower      leg.   HISTORY AND PHYSICAL/HOSPITAL COURSE:  The patient is a 76 year old male  with no previous history of coronary artery disease.  He does have  history of hypertension, hyperlipidemia, diabetes mellitus type 2 and  tobacco abuse.  The patient presents with class IV congestive heart  failure and flash pulmonary edema.  He ruled in for an acute myocardial  infarction by serial cardiac enzymes.  Pulmonary edema resolved rapidly  with diuretic management.  The patient underwent cardiac catheterization  by Dr. Juanda Chance on February08, 2008.  He was found to have severe three-  vessel coronary artery disease with severe left ventricular dysfunction.  He had an estimate ejection fraction of 15-20%.  Following  catheterization, Dr. Cornelius Moras was consulted.  Dr. Cornelius Moras discussed with the  patient undergoing coronary artery bypass grafting.  Risks and benefits  were discussed in great detail with the patient and family.  The patient  acknowledged understanding and agreed to proceed.  Surgery was scheduled  for February12,2008.  Preoperatively, the patient underwent  bilateral carotid Duplex ultrasound showing on the right no significant  ICA stenosis.  On the left, the patient had 60-80% ICA stenosis.  The  patient also had preoperative ABIs done showing on the right to be 0.92  and left to be 0.96.  The patient did remain stable preoperatively.  For  details of the patient's past medical history and physical exam, please  see dictated history and physical.   The patient was taken to the operating room February12,2008, where  he underwent coronary artery bypass grafting x3 using a left internal  mammary artery to distal left anterior descending coronary artery,  saphenous vein graft to second circumflex marginal branch, saphenous  vein graft to posterior descending coronary artery.  Endoscopic  saphenous vein harvesting from right leg.  The patient tolerated this  procedure well and was transferred to the  intensive care unit in stable  condition.   Postoperatively, the patient was noted to be hemodynamically stable.  He  was extubated the evening of surgery.  Following extubation, he was  noted to be alert and oriented x4, neurologically intact.  Postoperatively, the patient's vital signs were monitored closely.  He  was able to be weaned off milrinone and dopamine postop day #1.  The  patient's chest tube had minimal drainage and discontinued postop day  #1.  Postoperative chest x-ray was stable.  Due to the patient's severe  ischemic cardiomyopathy he was started on a low-dose ACE inhibitor.  The  patient was also started on low-dose beta blocker.  His vital signs  remained stable.  Postoperatively  he did develop acute blood loss anemia  with hemoglobin and hematocrit dropping to 9.2 and 27.  The patient was  asymptomatic and this remained stable.  He did develop volume overload.  Diuretics were initiated.  Daily weights were obtained and the patient  was back near his baseline prior to discharge home.  Due to the  patient's history of diabetes mellitus, his blood sugars were monitored  closely.  He was not on any p.o. medication preoperatively.  His blood  sugars were elevated and the patient was started on glipizide p.o.  These were monitored closely.  The patient remained in normal sinus  rhythm.  Pulmonary status was stable.  Incisions clean, dry and intact  and healing well.  He remained afebrile postoperatively.  Sats were  greater than 90% on room air.  He was out of bed ambulating well.  He  was tolerating regular diet well.  No nausea or vomiting noted.   Last labs postop day #3 showed a white count 10.7, hemoglobin 9.2,  hematocrit 27.1, platelet count 109.  He had a sodium of 136, potassium  3.6, chloride of 102, bicarb of 28, BUN 24, creatinine 1.4, glucose of  141.   The patient is tentatively ready for discharge home postop day 5-6.   FOLLOW-UP APPOINTMENTS:  1. A follow-up appointment was arranged with Dr. Cornelius Moras for      [phone], at 12:45 p.m.  2. The patient needs to follow up with Dr. Juanda Chance in 2 weeks.  He will      need to contact Dr. Regino Schultze office to arrange this appointment.  3. The patient also needs to follow up with Dr. Hetty Ely in two weeks      for diabetes management.  4. The patient will have echocardiogram done 6-8 weeks by [place].   ACTIVITY:  Mr. Junkin was instructed no driving until released to do so,  no heavy lifting over 10 pounds.  He is told to ambulate 3-4 times per  day, progress as tolerated and continue his breathing exercises.  INCISIONAL CARE:  The patient is told to shower washing his incisions  using soap and water.  He is  to contact the office if he develops any  drainage or opening from any of his incision sites.   DIET:  The patient was educated on diet to be low fat, low salt as well  as carbohydrate modified, medium calorie diet.   DISCHARGE MEDICATIONS:  1. Aspirin 81 mg daily.  2. Coreg 12.5 mg b.i.d.  3. Lisinopril 10 mg b.i.d.  4. Lipitor 40 mg at night.  5. Lasix 40 mg daily.  6. Potassium chloride 20 mEq daily.  7. Plavix 75 mg daily.  8. Glipizide 10 mg daily.  9. Oxycodone 5 mg  1-2 tablets q.4-6h. p.r.n. pain.      Theda Belfast, PA      Salvatore Decent. Cornelius Moras, M.D.  Electronically Signed    KMD/MEDQ  D:  05/22/2006  T:  05/22/2006  Job:  [id number]   cc:   Everardo Beals. Juanda Chance, MD, [facility]  Arta Silence, MD

## 2010-08-23 NOTE — Consult Note (Signed)
[name] [name], OLVEDA NO.:  [id number]   MEDICAL RECORD NO.:  [id number]          PATIENT TYPE:  INP   LOCATION:  2909                         FACILITY:  [facility]   PHYSICIAN:  Salvatore Decent. Cornelius Moras, M.D. DATE OF BIRTH:  1934-10-21   DATE OF CONSULTATION:  05/15/2006  DATE OF DISCHARGE:                                 CONSULTATION   CARDIOVASCULAR THORACIC SURGERY CONSULTATION   REFERRING PHYSICIAN:  Dr. Charlies Constable.   PRIMARY CARE PHYSICIAN:  Dr. Laurita Quint.   REASON FOR CONSULTATION:  Severe three-vessel coronary artery disease  with ischemic cardiomyopathy, class IV congestive heart failure, acute  non-ST-segment elevation myocardial infarction.   HISTORY OF PRESENT ILLNESS:  [name] [name] is a 76 year old retired white  male from [place] with no previous cardiac history but risk factors  notable for history of hypertension, hyperlipidemia, type 2 diabetes  mellitus, and tobacco abuse.  The patient is followed by Dr. Laurita Quint for treatment of the above-mentioned medical conditions, and he  has been told in the past that he has an enlarged heart on physical exam  and previous chest x-rays.  However, he has remained essentially free of  any symptoms until recently.  He reports that over the last 5-6 weeks he  has noted progressive exertional fatigue.  He states that approximately  4 weeks ago he had a period of one or 2 days where he felt quite poorly  but this seemed to get better.  Two days ago he developed sudden onset  severe resting shortness of breath which prompted him to summon EMS.  He  was brought here to the emergency room late on February6, 2008.  He  was noted to have class IV congestive heart failure with mild pulmonary  edema.  He was admitted to the hospital and ruled in for an acute non-ST-  segment elevation myocardial infarction based on serial cardiac enzymes.  Baseline electrocardiogram's were notable for normal sinus rhythm  without acute ST-segment changes.  His peak cardiac enzymes were notable  for a troponin-I of 2.29 with a total CK of 266 and CK-MB fraction of  21.0.  Baseline B-naturetic peptide level was elevated at 443.  The  patient was symptomatically improved by diuresis and reports that he now  feels better than he did for the last several days and, in fact, feels  as though essentially nothing has happened.  He underwent a 2-D  echocardiogram yesterday that was notable for severe left ventricular  dysfunction with ejection fraction estimated 15-20%.  No other specific  abnormalities were noted.  The patient subsequently underwent left and  right heart catheterization today by Dr. Juanda Chance.  He was found to have  severe three-vessel coronary artery disease with ischemic cardiomyopathy  and mild pulmonary hypertension.  Baseline cardiac output was normal.  Cardiac surgical consultation has been requested to consider surgical  revascularization.   REVIEW OF SYSTEMS:  GENERAL:  The patient reports normal appetite.  He  has not been gaining or losing weight recently.  He does report  progressive exertional fatigue over the last several weeks.  CARDIAC:  Notable for the absence of any history of substernal chest pain, chest  tightness, chest pressure with activity or at rest.  The patient  describes new onset severe shortness of breath that prompted hospital  admission and he does report some mild exertional shortness of breath  that has progressed recently.  The patient denies PND, orthopnea, or  lower extremity edema.  He has not had palpitations nor a syncopal  episode.  He does report having sustained a dizzy spell approximately 2  years ago for which he was evaluated briefly in the emergency department  at [facility]  He was told that there seemed  nothing to be wrong at the time.  RESPIRATORY:  Negative.  The patient  denies productive cough, hemoptysis, wheezing.   GASTROINTESTINAL:  Negative.  The patient has no difficulty swallowing.  He denies symptoms  of reflux.  He denies hematochezia, hematemesis, melena.  His bowel  function is regular.  MUSCULOSKELETAL:  Negative.  The patient denies  arthritis or arthralgias.  NEUROLOGIC:  Negative.  The patient denies  symptoms suggestive of previous TIA or stroke.  GENITOURINARY:  Negative.  HEENT:  Negative.  INFECTIOUS:  Negative.  ENDOCRINE:  Notable for known history of type 2 diabetes mellitus.  The patient  admits that he does not check his blood sugars at home regularly.   PAST MEDICAL HISTORY:  1. Hypertension.  2. Type 2 diabetes mellitus.  3. Hyperlipidemia.  4. Tobacco abuse.   PAST SURGICAL HISTORY:  Appendectomy in the distant past.   FAMILY HISTORY:  Notable for the absence of premature coronary artery  disease.  The patient's father died of a heart attack at age 32.  Mother  remains alive at age 57, although she does have history of heart  failure.   MEDICATIONS PRIOR TO ADMISSION:  1. Zocor.  2. Lisinopril with and without hydrochlorothiazide diuretic.  3. Diabetes medication, specific medication is unknown.  The patient does not have a list of his current home medications nor  does he remember them completely.   ALLERGIES:  Drug allergies:  None known.   SOCIAL:  The patient is retired and lives alone in [place].  He has  a previous history of tobacco abuse, although he quit smoking several  months ago.  He drinks beer regularly.   PHYSICAL EXAMINATION:  GENERAL APPEARANCE:  The patient is a well-  appearing, mildly obese white male who appears his stated age, in no  acute distress.  VITAL SIGNS:  He is currently in normal sinus rhythm.  Blood pressure  has been stable and not elevated.  HEENT:  Exam is grossly unrevealing.  NECK:  Supple.  There is no cervical nor supraclavicular lymphadenopathy.  There is no jugular venous distension.  No carotid  bruits are  noted.  CHEST:  Auscultation of the chest reveals a few bibasilar inspiratory  crackles.  No wheezes or rhonchi are noted.  CARDIOVASCULAR:  Exam  includes regular rate and rhythm.  No murmurs, rubs or gallops are  appreciated.  ABDOMEN:  The abdomen is mildly obese but soft, nontender.  Bowel sounds  are present.  There are no palpable masses.  EXTREMITIES:  Warm and  adequately perfused.  There is no lower extremity edema.  Distal pulses  are not palpable in either lower leg at the ankle.  There is no sign of  significant venous insufficiency.  SKIN:  The skin is clean, dry, healthy-appearing throughout.  RECTAL/GENITOURINARY:  Exams  are both deferred.  NEUROLOGIC:  Examination is grossly nonfocal and symmetrical throughout.   LABORATORY DATA:  Blood work obtained today includes hemoglobin 12.2,  hematocrit 35.6% ,white blood count 8500, platelet count 189,000.  The  patient's hemoglobin has dropped from baseline hemoglobin 15.2 at the  time of admission.  Serum electrolytes are notable for sodium 140,  potassium 3.0, chloride 105, bicarb 28, BUN 23, creatinine 1.3, glucose  108.  Lipid profile is notable for total cholesterol 198, LDL  cholesterol 128, HDL cholesterol 49, triglycerides 105.  His  glycosylated hemoglobin is elevated at 8.7.   DIAGNOSTIC TESTS:  Cardiac catheterization performed today by Dr. Juanda Chance  is reviewed.  This demonstrates severe three-vessel coronary artery  disease with ischemic cardiomyopathy and severe left ventricular  dysfunction.  Specifically, there is long segment heavily calcified  stenosis involving the mid and distal left anterior descending coronary  artery, beginning just after and involving takeoff of a large second  diagonal branch.  There is long segment tubular 80% stenosis of this  vessel.  There is 60-70% proximal stenosis of the large second diagonal  branch.  There is  80-90% tubular stenosis of the mid left circumflex  coronary artery,  before a large second circumflex marginal branch.  There is 100% occlusion of the mid right coronary artery with left-to-  right collateral flow to the posterior descending coronary artery.  There is severe left ventricular dysfunction with ejection fraction  estimated 15-20%.  There is inferior wall akinesis and global  hypokinesis.  Pulmonary artery pressures are notable for PA 42/24 with  pulmonary capillary wedge pressure of 19, left ventricular end-diastolic  pressure 33, central venous pressure 8, resting cardiac output is 5.0  liters per minute, corresponding to a cardiac index of 2.3.   IMPRESSION:  Severe three-vessel coronary artery disease with ischemic  cardiomyopathy, status post acute non-ST-segment elevation myocardial  infarction with class IV congestive heart failure.  I believe that Mr.  Garringer would probably best be treated with surgical revascularization, although the risks associated with surgery will be somewhat elevated,  and his long-term prognosis somewhat guarded, due to the severity of his  underlying left ventricular dysfunction.  In addition, there is question  raised on his transthoracic echocardiogram regarding the possibility of  mural thrombus on the left ventricular wall.  This would potentially be  with some increased risks of embolization or stroke at the time of  surgery.  Again, I do believe that he would probably best be treated  with surgery, and his distal target vessels appear graftable.  His long-  term prognosis with medical therapy would be considerably attenuated.   PLAN:  I have discussed options at length with Mr. Nickson and his close  friend and neighbor here in the cardiac intensive care unit this  afternoon.  Alternative treatment strategies have been discussed in  detail.  He understands all associated risks of surgery, including but  not limited to risk of death, stroke, myocardial infarction, congestive  heart failure, inability to  wean from cardiopulmonary bypass potentially  requiring mechanical cardiac support, respiratory failure, pneumonia,  bleeding requiring blood transfusion, arrhythmia, heart block or  bradycardia requiring permanent pacemaker, recurrent problems with  coronary artery disease or congestive heart failure.  He also  understands that he may ultimately be considered a candidate for  implantable defibrillator due to the severity of left ventricular  dysfunction, depending upon the degree of recovery of left ventricular  dysfunction after successful revascularization.  All of his questions  have been addressed.  At this point, Mr. Kilgore seems insistent on going  home for a period of time before proceeding with elective surgery.  He  understands that this may be associated with some risk of cardiac event  during the interim period of time.  We can proceed with surgery early  next week if he is agreeable.  All of his questions have been addressed.      Salvatore Decent. Cornelius Moras, M.D.  Electronically Signed     CHO/MEDQ  D:  05/15/2006  T:  05/16/2006  Job:  [id number]   cc:   Everardo Beals. Juanda Chance, MD, [facility]  Arta Silence, MD

## 2010-08-27 ENCOUNTER — Ambulatory Visit: Payer: Medicare Other | Admitting: Family Medicine

## 2010-09-09 ENCOUNTER — Other Ambulatory Visit: Payer: Self-pay | Admitting: *Deleted

## 2010-09-09 NOTE — Telephone Encounter (Signed)
According to the last (07/26/10) note, he was injecting 20units each AM.  Please clarify with patient and then send back to me.  Thanks.

## 2010-09-09 NOTE — Telephone Encounter (Signed)
Request from pharmacy has different directions, it says to inject 30 units twice daily, but chart says 20 units twice daily.

## 2010-09-10 NOTE — Telephone Encounter (Signed)
Left message with patient's wife to call back.

## 2010-09-11 ENCOUNTER — Other Ambulatory Visit: Payer: Self-pay | Admitting: *Deleted

## 2010-09-11 MED ORDER — DIAZEPAM 10 MG PO TABS: ORAL_TABLET | ORAL | Status: DC

## 2010-09-11 NOTE — Telephone Encounter (Signed)
Please call in

## 2010-09-11 NOTE — Telephone Encounter (Signed)
Rx called in as directed.   

## 2010-09-12 MED ORDER — INSULIN DETEMIR 100 UNIT/ML ~~LOC~~ SOLN: SUBCUTANEOUS | Status: DC

## 2010-09-12 NOTE — Telephone Encounter (Signed)
Patient returned call and says that he is injecting 20 units each day. Rx called to pharmacy.

## 2010-09-13 ENCOUNTER — Telehealth: Payer: Self-pay | Admitting: Internal Medicine

## 2010-09-13 NOTE — Telephone Encounter (Signed)
Been taking exercise program at [facility], but needs a physician clearance before he can continue.  Please fax to [phone] attn:  Donnamarie Poag.  Please let pt know when sent.

## 2010-09-18 ENCOUNTER — Telehealth: Payer: Self-pay | Admitting: Internal Medicine

## 2010-09-18 NOTE — Telephone Encounter (Signed)
See other telephone notes [name] [name]

## 2010-09-18 NOTE — Telephone Encounter (Signed)
Pt wife states pt needs a letter stating he can go back to exercise program at the hospital in [place]. Fax# [phone] attn gena.

## 2010-09-18 NOTE — Telephone Encounter (Signed)
Looks like this was faxed on 08/05/10 per Carmelina Noun  Will resend

## 2010-09-20 ENCOUNTER — Telehealth: Payer: Self-pay | Admitting: Internal Medicine

## 2010-09-20 NOTE — Telephone Encounter (Signed)
Spoke with lori, we have sent paperwork for the pt to do cardiac rehab and that was a mis message. The pt had  Been in the [facility] program and just wants to be able to rejoin. She is going to fax over the correct form for [place] to fax back Deliah Goody

## 2010-09-20 NOTE — Telephone Encounter (Signed)
Lora from [facility] [employer] has question re pt condition. Mervyn Gay would like to speak with a nurse.

## 2010-09-30 ENCOUNTER — Other Ambulatory Visit (INDEPENDENT_AMBULATORY_CARE_PROVIDER_SITE_OTHER): Payer: Medicare Other | Admitting: Family Medicine

## 2010-09-30 DIAGNOSIS — E119 Type 2 diabetes mellitus without complications: Secondary | ICD-10-CM

## 2010-09-30 LAB — HEMOGLOBIN A1C: Hgb A1c MFr Bld: 9.1 % — ABNORMAL HIGH (ref 4.6–6.5)

## 2010-10-03 ENCOUNTER — Encounter: Payer: Self-pay | Admitting: Family Medicine

## 2010-10-03 ENCOUNTER — Ambulatory Visit (INDEPENDENT_AMBULATORY_CARE_PROVIDER_SITE_OTHER): Payer: Medicare Other | Admitting: Family Medicine

## 2010-10-03 VITALS — BP 140/86 | HR 82 | Temp 98.1°F | Wt 256.8 lb

## 2010-10-03 DIAGNOSIS — E119 Type 2 diabetes mellitus without complications: Secondary | ICD-10-CM

## 2010-10-03 NOTE — Assessment & Plan Note (Signed)
D/w pt about exercise and taking his insulin as directed.  Will check A1c in 3 months.  He had questions about cards follow up and I advised him to call cards.  >25 min spent with face to face with patient, >50% counseling and/or coordinating care.

## 2010-10-03 NOTE — Patient Instructions (Addendum)
I want you to take the levemir each morning (20 units) and then use 3 shots of novolog a day based on the sliding scale.  I want to recheck your sugar in 3 months at a lab visit.  Come see me for a visit after that.  Take care.  Call cardiology about your follow up appointment.

## 2010-10-03 NOTE — Progress Notes (Signed)
Diabetes: back in exercise program now.   Using medications without difficulties: was taking levemir in AM but only using novolog twice a day with sliding scale.   Hypoglycemic episodes: no Hyperglycemic episodes: as below Feet problems: Blood Sugars averaging:150 this AM, often 200 in AM Labs d/w pt.  A1c is up.   I talked to pt about drinking and weight.   He denies drinking wine.    PMH and SH reviewed  Meds, vitals, and allergies reviewed.   ROS: See HPI.  Otherwise negative.    GEN: nad, alert and oriented HEENT: mucous membranes moist NECK: supple w/o LA CV: rrr.  ICD incision healed, not ttp PULM: ctab, no inc wob ABD: soft, +bs EXT: trace edema SKIN: no acute rash  Diabetic foot exam: Normal inspection No skin breakdown No calluses  1+ DP pulses Normal sensation to light touch and monofilament but he does have some tingling in the feet x2.   Nails thickened

## 2010-10-07 ENCOUNTER — Ambulatory Visit: Payer: Medicare Other | Admitting: Family Medicine

## 2010-10-08 ENCOUNTER — Telehealth: Payer: Self-pay | Admitting: *Deleted

## 2010-10-08 DIAGNOSIS — I2510 Atherosclerotic heart disease of native coronary artery without angina pectoris: Secondary | ICD-10-CM

## 2010-10-08 NOTE — Telephone Encounter (Signed)
Patient says that he really would like to see Dr. Lenis Dickinson, cardiologist at [facility] for a second opinion. He says that he has discussed all of this with you. He is asking if we can send a referral and office notes to Dr. Dawna Part for him to review. His fax number is [phone].

## 2010-10-08 NOTE — Telephone Encounter (Signed)
Will refer per pt request.

## 2010-10-16 ENCOUNTER — Telehealth: Payer: Self-pay | Admitting: *Deleted

## 2010-10-16 NOTE — Telephone Encounter (Signed)
Pt needs for for handicapped placard completed.  Form is on your desk. Also, pt is asking for order for portable oxygen. He gets short of breath with exertion.  Advised pt's wife he will need office visit to discuss.  Appt made for next week, they will pick up handicapped placard form then.

## 2010-10-16 NOTE — Telephone Encounter (Signed)
Spoke with patient's wife and changed appt to a 30 minute eval. She was also questioning possible eval for Alzhiemers. I advised to do the O2 portion first and then schedule another appt for the Alzhiemers eval since they both can be lengthy visits. She verbalized understanding. I just wanted to mention this to you as a reminder/trigger.

## 2010-10-16 NOTE — Telephone Encounter (Signed)
Noted  

## 2010-10-16 NOTE — Telephone Encounter (Signed)
Form signed, it's in my outbox.  Please make sure it's a OV. Thanks.

## 2010-10-17 NOTE — Telephone Encounter (Signed)
Cardiology appointment has been scheduled ...[facility]

## 2010-10-18 ENCOUNTER — Other Ambulatory Visit: Payer: Self-pay | Admitting: *Deleted

## 2010-10-18 NOTE — Telephone Encounter (Signed)
Received faxed refill request please advise.  This medication was not listed on med list.

## 2010-10-20 MED ORDER — NITROGLYCERIN 0.4 MG SL SUBL: 0.4000 mg | SUBLINGUAL_TABLET | SUBLINGUAL | Status: DC | PRN

## 2010-10-20 NOTE — Telephone Encounter (Signed)
Sent!

## 2010-10-21 ENCOUNTER — Encounter: Payer: Self-pay | Admitting: Family Medicine

## 2010-10-21 ENCOUNTER — Ambulatory Visit: Payer: Medicare Other | Admitting: Family Medicine

## 2010-10-21 ENCOUNTER — Ambulatory Visit (INDEPENDENT_AMBULATORY_CARE_PROVIDER_SITE_OTHER): Payer: Medicare Other | Admitting: Family Medicine

## 2010-10-21 DIAGNOSIS — R0602 Shortness of breath: Secondary | ICD-10-CM

## 2010-10-21 DIAGNOSIS — E119 Type 2 diabetes mellitus without complications: Secondary | ICD-10-CM

## 2010-10-21 NOTE — Progress Notes (Signed)
He came in today asking about home O2.  He appears to have several problems.    1. SOB with exertion.   2. SOB at rest at night, usually when waking from nightmares 3. BP elevation with exercise.  4. Pending cards eval at [facility] 5. Asking about DM2 shoes  76 y/o male, recently stopped smoking and drinking with h/o CABG and poorly controlled DM2 with abnormal myoview this spring with  widely patent grafts on cath 06/2010.  Continues to have sob with exertion along with cough, wheeze. H/o hyperinflation on CXR.  All worse on hot days.  No CP.  Will also have periods where he wakes from nightmares with feeling of being suffocated, like being buried.  Occ takes valium for this (had old rx for tinnitus). He has sig stressors related to childhood in [place].    He's asking about getting records for [facility] cards eval. See below.  He is asking about pacer placement and I deferred to cards on this.   BP elevation with exercise.  Will happen occ, but not always with exercise/card rehab program.  His pressures may be elevated or normal when checked.  Again, w/o CP.    DM2 shoes: Callus on L 3rd and 4th toe .  L 1st nail needs to grow out.  No dec in sensation now.  We talked about sticking to meds and diet.    Prev cath reviewed.   PMH and SH reviewed  ROS: See HPI, otherwise noncontributory.  Meds, vitals, and allergies reviewed.   nad ncat Neck supple rrr Ctab, no wheeze Abd soft, obese, not ttp Ext w/o edema.    Diabetic foot exam: Normal inspection No skin breakdown Normal DP pulses Normal sensation to light touch and monofilament Callus on L 3rd and 4th toe .  L 1st nail needs to grow out but not infected  Pt is 95-97% on RA, no dec with walking, but HR inc from ~70 to 100 with walking.  No inc in BP with walking in clinic.  Peak flow 440, low for height/age/gender.

## 2010-10-21 NOTE — Patient Instructions (Signed)
Let me think about your situation and I'll send information to you.  Take care.

## 2010-10-22 ENCOUNTER — Telehealth: Payer: Self-pay | Admitting: Family Medicine

## 2010-10-22 DIAGNOSIS — R0602 Shortness of breath: Secondary | ICD-10-CM | POA: Insufficient documentation

## 2010-10-22 MED ORDER — CARVEDILOL 25 MG PO TABS: ORAL_TABLET | ORAL | Status: DC

## 2010-10-22 NOTE — Telephone Encounter (Signed)
Please get cards clinic notes, cath reports, discharge summary, prev echo and EKG reports for patient.    Please call pt.  I think his exertional SOB is likely related to at least COPD and likely COPD + prev cardiac disease.  I want him to f/u with [facility] cards and consider starting spiriva once a day in meantime for COPD.  If he's willing to start it, please send in rx for spiriva a day inhaled, #30, 5rf.    His SOB at night and nightmares may likely have a component of anxiety.  I would consider counseling and not adding other meds for this.    I am still unsure of the cause of his inc in BP with exercise.  I don't know if this potentially anxiety related (this may or may not be the cause).  I wouldn't change meds before he sees [facility] cards.    I don't think he needs O2 at this point.

## 2010-10-22 NOTE — Telephone Encounter (Signed)
LMOM for pt to call. 

## 2010-10-22 NOTE — Assessment & Plan Note (Signed)
D/w pt about staying on meds and off etoh.  He understood. Rx for shoes written, given the calluses.

## 2010-10-22 NOTE — Assessment & Plan Note (Addendum)
Likely multifactorial, with COPD (low peak, hyperinflation) and cardiac output (CAD with deconditioning) related to exertional sx (and inc in HR).  I will defer to cards re: CAD and will get records for pt.  In meantime, Pt is off etoh and tobacco. Will offer trial of spiriva for COPD.  Would like to avoid SABA and steroids given CAD and DM2.    Resting SOB likely related to anxiety/PTSD given his history and will offer counseling.    I am unsure of BP elevation cause; this may be related to anxiety.  >45 min spent with face to face with patient, >50% counseling and/or coordinating care.

## 2010-10-23 ENCOUNTER — Other Ambulatory Visit: Payer: Self-pay

## 2010-10-23 MED ORDER — TIOTROPIUM BROMIDE MONOHYDRATE 18 MCG IN CAPS: 18.0000 ug | ORAL_CAPSULE | Freq: Every day | RESPIRATORY_TRACT | Status: DC

## 2010-10-23 NOTE — Telephone Encounter (Signed)
Spiriva called to pharmacy.

## 2010-10-25 ENCOUNTER — Other Ambulatory Visit: Payer: Self-pay | Admitting: *Deleted

## 2010-10-25 MED ORDER — INSULIN ASPART 100 UNIT/ML ~~LOC~~ SOLN: SUBCUTANEOUS | Status: DC

## 2010-10-25 NOTE — Telephone Encounter (Signed)
Needs a refill for carvedilol 25 mg take 1 and 1/2 tablet twice a day.

## 2010-10-28 NOTE — Telephone Encounter (Signed)
Duplicate message for a refill on carvedilol 25 mg.

## 2010-10-30 ENCOUNTER — Ambulatory Visit (INDEPENDENT_AMBULATORY_CARE_PROVIDER_SITE_OTHER): Payer: Medicare Other | Admitting: *Deleted

## 2010-10-30 DIAGNOSIS — Z95810 Presence of automatic (implantable) cardiac defibrillator: Secondary | ICD-10-CM

## 2010-10-30 DIAGNOSIS — I5022 Chronic systolic (congestive) heart failure: Secondary | ICD-10-CM

## 2010-10-30 DIAGNOSIS — I2589 Other forms of chronic ischemic heart disease: Secondary | ICD-10-CM

## 2010-10-30 NOTE — Progress Notes (Signed)
icd check/[place] 

## 2010-11-12 ENCOUNTER — Other Ambulatory Visit: Payer: Self-pay | Admitting: *Deleted

## 2010-11-13 ENCOUNTER — Other Ambulatory Visit: Payer: Self-pay | Admitting: *Deleted

## 2010-11-13 MED ORDER — DIAZEPAM 10 MG PO TABS: ORAL_TABLET | ORAL | Status: DC

## 2010-11-13 MED ORDER — ZOLPIDEM TARTRATE 10 MG PO TABS: 10.0000 mg | ORAL_TABLET | Freq: Every evening | ORAL | Status: DC | PRN

## 2010-11-13 NOTE — Telephone Encounter (Signed)
Last refilled 08/08/2010.

## 2010-11-13 NOTE — Telephone Encounter (Signed)
Please call in

## 2010-11-13 NOTE — Telephone Encounter (Signed)
Rx called to [place] pharmacy. 

## 2010-11-14 NOTE — Telephone Encounter (Signed)
Rx called to [place] pharmacy. 

## 2010-11-26 ENCOUNTER — Encounter: Payer: Self-pay | Admitting: Family Medicine

## 2010-11-26 DIAGNOSIS — F419 Anxiety disorder, unspecified: Secondary | ICD-10-CM | POA: Insufficient documentation

## 2011-01-03 ENCOUNTER — Other Ambulatory Visit: Payer: Medicare Other

## 2011-01-07 ENCOUNTER — Telehealth: Payer: Self-pay | Admitting: *Deleted

## 2011-01-07 ENCOUNTER — Other Ambulatory Visit (INDEPENDENT_AMBULATORY_CARE_PROVIDER_SITE_OTHER): Payer: Medicare Other

## 2011-01-07 DIAGNOSIS — E119 Type 2 diabetes mellitus without complications: Secondary | ICD-10-CM

## 2011-01-07 NOTE — Telephone Encounter (Signed)
Lab appt scheduled for today. 

## 2011-01-07 NOTE — Telephone Encounter (Signed)
Yes, A1c ahead of time.  Orders should be in already.  Thanks.

## 2011-01-07 NOTE — Telephone Encounter (Signed)
No answer at home or cell number and no available VM.

## 2011-01-07 NOTE — Telephone Encounter (Signed)
Pt has appt to come in on Thursday for follow up.  He's asking if he needs labs prior.

## 2011-01-09 ENCOUNTER — Ambulatory Visit (INDEPENDENT_AMBULATORY_CARE_PROVIDER_SITE_OTHER): Payer: Medicare Other | Admitting: Family Medicine

## 2011-01-09 ENCOUNTER — Encounter: Payer: Self-pay | Admitting: Family Medicine

## 2011-01-09 VITALS — BP 158/78 | HR 69 | Temp 98.1°F | Wt 253.0 lb

## 2011-01-09 DIAGNOSIS — E119 Type 2 diabetes mellitus without complications: Secondary | ICD-10-CM

## 2011-01-09 DIAGNOSIS — I5022 Chronic systolic (congestive) heart failure: Secondary | ICD-10-CM

## 2011-01-09 DIAGNOSIS — IMO0002 Reserved for concepts with insufficient information to code with codable children: Secondary | ICD-10-CM

## 2011-01-09 DIAGNOSIS — R209 Unspecified disturbances of skin sensation: Secondary | ICD-10-CM

## 2011-01-09 DIAGNOSIS — Z23 Encounter for immunization: Secondary | ICD-10-CM

## 2011-01-09 DIAGNOSIS — F515 Nightmare disorder: Secondary | ICD-10-CM

## 2011-01-09 LAB — HEMOGLOBIN A1C: Hgb A1c MFr Bld: 8.4 % — ABNORMAL HIGH (ref 4.6–6.5)

## 2011-01-09 NOTE — Progress Notes (Signed)
Diabetes:  Using medications without difficulties:yes Hypoglycemic episodes:no Hyperglycemic episodes:no Feet problems: some tingling Blood Sugars averaging: ~125, ~225 after snacks He's working on diet.   R chest dermatomal change in sensation, no rash.   No trauma. No pain. Sx are mild but persistent.    Skin lesion in L pinna, he'll schedule f/u with derm.    CHF:  Dec in EF and pacer suggested by [facility].  We talked about this today.  SOB continues to be a problem for him.  See plan.   Nightmares.  He went to talk with psych at [facility].  Pt doesn't want to take other meds.  He had f/u scheduled with [facility].  He's having less trouble with nightmares in the meantime.  He has seen sig social upheaval in his life, ie pre, during, and post WWII.    PMH and SH reviewed  Meds, vitals, and allergies reviewed.   ROS: See HPI.  Otherwise negative.    GEN: nad, alert and oriented HEENT: mucous membranes moist NECK: supple w/o LA CV: rrr. PULM: ctab, no inc wob ABD: soft, +bs EXT: trace edema SKIN: no acute rash on chest wall. Change in sensation along R mid chest, anterior and posterior, in dermatomal distribution  Diabetic foot exam: Normal inspection No skin breakdown No calluses  1+ DP pulses Normal sensation to light touch and monofilament but tingling present per patient.  Nails thickened.   A1c is pending.  See notes on labs.

## 2011-01-09 NOTE — Patient Instructions (Signed)
Keep working on your diet and we'll contact you with your lab report.  I want to hear what [facility] thinks in 11/12.  Take care.  Let me know if the sensation on the right side gets worse.   Glad to see you today.

## 2011-01-10 ENCOUNTER — Encounter: Payer: Self-pay | Admitting: Family Medicine

## 2011-01-10 DIAGNOSIS — R209 Unspecified disturbances of skin sensation: Secondary | ICD-10-CM | POA: Insufficient documentation

## 2011-01-10 DIAGNOSIS — F515 Nightmare disorder: Secondary | ICD-10-CM | POA: Insufficient documentation

## 2011-01-10 LAB — GLUCOSE, CAPILLARY: Glucose-Capillary: 378 mg/dL — ABNORMAL HIGH (ref 70–99)

## 2011-01-10 NOTE — Assessment & Plan Note (Signed)
Per cards, I asked him to consider the pacer suggestion. He appears to have reasonable fluid status today.

## 2011-01-10 NOTE — Assessment & Plan Note (Signed)
Now improved, will follow clinically.

## 2011-01-10 NOTE — Assessment & Plan Note (Signed)
Unclear source, not progressive, no rash to suggest shingles. Will follow.  Wouldn't treat unless sx were bothersome to pt. He agrees.

## 2011-01-10 NOTE — Assessment & Plan Note (Signed)
See notes on labs and continue to work on diet.  No change in meds today.  >25 min spent with face to face with patient, >50% counseling.

## 2011-01-22 ENCOUNTER — Encounter: Payer: Self-pay | Admitting: Internal Medicine

## 2011-01-22 ENCOUNTER — Ambulatory Visit (INDEPENDENT_AMBULATORY_CARE_PROVIDER_SITE_OTHER): Payer: Medicare Other | Admitting: Internal Medicine

## 2011-01-22 DIAGNOSIS — I2589 Other forms of chronic ischemic heart disease: Secondary | ICD-10-CM

## 2011-01-22 DIAGNOSIS — I5022 Chronic systolic (congestive) heart failure: Secondary | ICD-10-CM

## 2011-01-22 DIAGNOSIS — Z95810 Presence of automatic (implantable) cardiac defibrillator: Secondary | ICD-10-CM

## 2011-01-22 LAB — ICD DEVICE OBSERVATION
BATTERY VOLTAGE: 3.14 V
DEV-0020ICD: NEGATIVE
TZAT-0004FASTVT: 8
TZAT-0004SLOWVT: 8
TZAT-0004SLOWVT: 8
TZAT-0005FASTVT: 88 pct
TZAT-0005SLOWVT: 88 pct
TZAT-0005SLOWVT: 91 pct
TZAT-0011SLOWVT: 10 ms
TZAT-0011SLOWVT: 10 ms
TZAT-0012FASTVT: 200 ms
TZAT-0012SLOWVT: 200 ms
TZAT-0012SLOWVT: 200 ms
TZAT-0013FASTVT: 1
TZON-0003SLOWVT: 340 ms
TZON-0003VSLOWVT: 450 ms
TZST-0001FASTVT: 2
TZST-0001FASTVT: 5
TZST-0001SLOWVT: 4
TZST-0003FASTVT: 35 J
TZST-0003FASTVT: 35 J
TZST-0003SLOWVT: 15 J
TZST-0003SLOWVT: 35 J
TZST-0003SLOWVT: 35 J

## 2011-01-22 NOTE — Assessment & Plan Note (Signed)
His symptoms are currently class II. I have encouraged the patient to reduce his sodium intake, increase his physical activity, and continue his current medical therapy. We'll plan to see him back in several months. For now I would hold off on attempting an upgrade of his defibrillator waiting to see how his congestive heart failure improves or worsens.

## 2011-01-22 NOTE — Progress Notes (Signed)
HPI Mr. Zanella returns today for followup. He is a 76 year old man with an ischemic cardiomyopathy and class II congestive heart failure. He has left bundle branch block. He also has nonsustained ventricular tachycardia. Since his last visit, he has had no ICD shocks. He admits to sodium indiscretion. He also admits to occasionally missing his medications and drinking alcohol in excess. He has not had syncope. He denies chest pain. Allergies  Allergen Reactions  . Sulfa Antibiotics Rash     Current Outpatient Prescriptions  Medication Sig Dispense Refill  . aspirin 81 MG tablet Take 81 mg by mouth 2 (two) times daily.        . carvedilol (COREG) 25 MG tablet Take one and one half tablets two times a day  180 tablet  11  . Cinnamon 500 MG capsule Take 2 tablets daily by mouth       . clopidogrel (PLAVIX) 75 MG tablet Take 1 tablet (75 mg total) by mouth daily.  30 tablet  11  . diazepam (VALIUM) 10 MG tablet Take 1/2 to 1 tablet at bedtime as needed for ringing in the ears.  30 tablet  1  . furosemide (LASIX) 40 MG tablet Take 1 tablet (40 mg total) by mouth daily.  30 tablet  6  . glucose blood (ACCU-CHEK AVIVA) test strip Use one strip twice a day       . insulin aspart (NOVOLOG PENFILL) 100 UNIT/ML injection Scale: sugar 100-150, 15 units. 150-200, 17 units. 200-250, 19 units. 250+, 21 units.  Use 3 times a day.  30 mL  6  . insulin detemir (LEVEMIR FLEXPEN) 100 UNIT/ML injection Inject 20 units every morning  15 mL  3  . lisinopril (PRINIVIL,ZESTRIL) 20 MG tablet Take 20 mg by mouth daily.        . nitroGLYCERIN (NITROSTAT) 0.4 MG SL tablet Place 1 tablet (0.4 mg total) under the tongue every 5 (five) minutes as needed for chest pain.  25 tablet  3  . Omega-3 Fatty Acids (OMEGA-3 PLUS PO) Take by mouth. Take 2 tablets by mouth daily       . rosuvastatin (CRESTOR) 20 MG tablet Take 20 mg by mouth at bedtime.        Marland Kitchen spironolactone (ALDACTONE) 25 MG tablet Take 1/2 tablet daily       .  zolpidem (AMBIEN) 10 MG tablet Take 1 tablet (10 mg total) by mouth at bedtime as needed. For insomnia, sparingly  30 tablet  2     Past Medical History  Diagnosis Date  . COPD (chronic obstructive pulmonary disease) 1993  . Diabetes mellitus, type 2 2000  . Hyperlipidemia 1993  . Hypertension 1993  . CHF (congestive heart failure)     hospital, med nonadherence 2/5-05/16/09  . CAD (coronary artery disease)     bypass graft surgery 05/2006; grafts were widely patent on cath 06/2010   . Ischemic cardiomyopathy     EF 30-55%  . Systolic heart failure     class II  . S/P ICD (internal cardiac defibrillator) procedure 2010    single  . Foot fracture 1953    left  . Nightmares     with psych eval 2012 at [facility]    ROS:   All systems reviewed and negative except as noted in the HPI.   Past Surgical History  Procedure Date  . Coronary artery bypass graft     3 vessel, class 4 CHF, non Q wave MI 05/19/06  Family History  Problem Relation Age of Onset  . Heart disease Father     MI age 73  . Diabetes Sister   . Hyperlipidemia Sister   . Cancer Maternal Grandmother     liver, cirrhosis  . Depression Neg Hx   . Alcohol abuse Neg Hx   . Drug abuse Neg Hx   . Stroke Neg Hx      History   Social History  . Marital Status: Married    Spouse Name: N/A    Number of Children: 0  . Years of Education: N/A   Occupational History  . 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 Smoking status: Former Games developer  . Smokeless tobacco: Never Used  . Alcohol Use: No     has stopped drinking as of 10/2010  . Drug Use: No  . Sexually Active: Not on file   Other Topics Concern  . 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  See PaceArt for details.   Assess/Plan:

## 2011-01-22 NOTE — Patient Instructions (Signed)
Follow up in 3 months with [name] [name] No changes in current medications. Follow with Dr. Ladona Ridgel in 6 months.

## 2011-01-22 NOTE — Assessment & Plan Note (Signed)
His device is working normally. Plan a recheck in several months.

## 2011-02-07 ENCOUNTER — Encounter: Payer: Self-pay | Admitting: Family Medicine

## 2011-02-07 ENCOUNTER — Ambulatory Visit (INDEPENDENT_AMBULATORY_CARE_PROVIDER_SITE_OTHER): Payer: Medicare Other | Admitting: Family Medicine

## 2011-02-07 DIAGNOSIS — E119 Type 2 diabetes mellitus without complications: Secondary | ICD-10-CM

## 2011-02-07 LAB — POCT CBG (FASTING - GLUCOSE)-MANUAL ENTRY: Glucose Fasting, POC: 173 mg/dL — AB (ref 70–99)

## 2011-02-07 LAB — BASIC METABOLIC PANEL
CO2: 23 mEq/L (ref 19–32)
Calcium: 9.5 mg/dL (ref 8.4–10.5)
Chloride: 103 mEq/L (ref 96–112)
Sodium: 136 mEq/L (ref 135–145)

## 2011-02-07 NOTE — Patient Instructions (Signed)
Don't change your meds for now. Avoid sweets and monitor your sugar.  You can get your results through our phone system.  Follow the instructions on the blue card. I'll see if you can/should start back on the metformin after I have your labs back.  Don't start it yet.  Take care.

## 2011-02-07 NOTE — Progress Notes (Signed)
Balance symptoms.  No tobacco, no etoh recently until last night he drank some wine last night.  His sugar went higher than normal (ie 300+).  This AM sugar was back to 100.  He noted a change in the last 1-2 days during episodes of hyperglycemia.  Prev he was doing well but as his sugar increased he felt like his balance was off. The room didn't spin, but he felt weak in his legs bilaterally.  No syncope.  No presyncope. No focal unilateral neuro sx.  The only recent change noted (other then the balance sx) was the inc in glucose.  No other med changes.   He has no sx currently.   Capillary Blood Sugar Reading on patient's glucometer 192, similar to ours today.    Meds, vitals, and allergies reviewed.   ROS: See HPI.  Otherwise, noncontributory.  nad ncat Mmm rrr ctab Abd soft, not ttp Ext w/o cyanosis Cn 2-12 wnl except for hard of hearing, at baseline.  No motor or sensation changes from prev Coordination wnl, speech wnl, at baseline

## 2011-02-08 ENCOUNTER — Encounter: Payer: Self-pay | Admitting: Family Medicine

## 2011-02-08 NOTE — Assessment & Plan Note (Signed)
It appears to only be related to periods of hyperglycemia.  No other recent changes are known to explain/correlate his sx.  He doesn't have unilateral sx or persistent defects in motor/coordination.  He has no sx now.  He doesn't describe true vertigo.  I would observe for now and try to limit hyperglycemia.  D/w pt.  I wouldn't restart metformin based on his Cr.

## 2011-04-03 ENCOUNTER — Other Ambulatory Visit: Payer: Self-pay

## 2011-04-03 MED ORDER — DIAZEPAM 10 MG PO TABS: ORAL_TABLET | ORAL | Status: DC

## 2011-04-03 NOTE — Telephone Encounter (Signed)
Please call in

## 2011-04-03 NOTE — Telephone Encounter (Signed)
Medication phoned to pharmacy.  

## 2011-04-03 NOTE — Telephone Encounter (Signed)
[facility] [facility] faxed refill request Diazepam 10 mg. Last filled 01/29/11 and pt last seen 02/07/11.Please advise.

## 2011-04-25 ENCOUNTER — Other Ambulatory Visit: Payer: Self-pay | Admitting: *Deleted

## 2011-04-25 MED ORDER — LISINOPRIL 20 MG PO TABS: 20.0000 mg | ORAL_TABLET | Freq: Every day | ORAL | Status: DC

## 2011-05-16 ENCOUNTER — Other Ambulatory Visit (INDEPENDENT_AMBULATORY_CARE_PROVIDER_SITE_OTHER): Payer: Medicare Other

## 2011-05-16 DIAGNOSIS — E119 Type 2 diabetes mellitus without complications: Secondary | ICD-10-CM

## 2011-05-19 ENCOUNTER — Ambulatory Visit (INDEPENDENT_AMBULATORY_CARE_PROVIDER_SITE_OTHER): Payer: Medicare Other | Admitting: Family Medicine

## 2011-05-19 ENCOUNTER — Encounter: Payer: Self-pay | Admitting: Family Medicine

## 2011-05-19 VITALS — BP 142/70 | HR 69 | Temp 98.0°F | Wt 251.0 lb

## 2011-05-19 DIAGNOSIS — E119 Type 2 diabetes mellitus without complications: Secondary | ICD-10-CM

## 2011-05-19 NOTE — Patient Instructions (Addendum)
Take less of the novolog with each meal.  See the dosing changes.   Recheck A1c in 3 months along with other labs.   If you continue to have lower sugars, then call me in the meantime.  Glad to see you today.

## 2011-05-19 NOTE — Progress Notes (Signed)
Diabetes:  Using medications without difficulties:yes, except for missing 2nd and 3rd shots later in the day Hypoglycemic episodes: occ Hyperglycemic episodes:occ Feet problems: no new problems Blood Sugars averaging: ~160 in AM A1c 8.2 He's worried about hypoglycemia in the afternoon, night, and was skipping shots as a result.  We talked about lowering the insulin dose.  He's not drinking and is cutting out sweets.   He's smoking less.    He says the nightmares are better.   PMH and SH reviewed  Meds, vitals, and allergies reviewed.   ROS: See HPI.  Otherwise negative.    GEN: nad, alert and oriented HEENT: mucous membranes moist NECK: supple w/o LA CV: rrr. PULM: ctab, no inc wob ABD: soft, +bs EXT: no edema SKIN: no acute rash  Diabetic foot exam: Normal inspection Calluses on heels noted, no skin breakdown 1+ DP pulses Normal sensation to light touch and monofilament Nails normal

## 2011-05-21 ENCOUNTER — Encounter: Payer: Self-pay | Admitting: Family Medicine

## 2011-05-21 NOTE — Assessment & Plan Note (Addendum)
Will dec insulin dose for mealtime. Hopefully he'll tolerate and use it, with result of improved DM2.  D/w pt.  He understood. >25 min spent with face to face with patient, >50% counseling and/or coordinating care.  D/w pt about foot care.  He agrees. Recheck A1c in 3 months. He says he isn't drinking.

## 2011-06-09 ENCOUNTER — Other Ambulatory Visit: Payer: Self-pay | Admitting: *Deleted

## 2011-06-09 ENCOUNTER — Other Ambulatory Visit: Payer: Self-pay | Admitting: Internal Medicine

## 2011-06-09 MED ORDER — SPIRONOLACTONE 25 MG PO TABS: 25.0000 mg | ORAL_TABLET | Freq: Every day | ORAL | Status: DC

## 2011-06-09 NOTE — Telephone Encounter (Signed)
New problem:  Please clarify direction/ dosage of medication- spironolactone.

## 2011-06-10 ENCOUNTER — Other Ambulatory Visit: Payer: Self-pay

## 2011-06-10 MED ORDER — SPIRONOLACTONE 25 MG PO TABS: 12.5000 mg | ORAL_TABLET | Freq: Every day | ORAL | Status: DC

## 2011-06-10 MED ORDER — DIAZEPAM 10 MG PO TABS: ORAL_TABLET | ORAL | Status: DC

## 2011-06-10 NOTE — Telephone Encounter (Signed)
Pt called back  Spironolactone 25 mg- He takes 1/2 tablet once per day

## 2011-06-10 NOTE — Telephone Encounter (Signed)
Please call in

## 2011-06-10 NOTE — Telephone Encounter (Signed)
Medication phoned to pharmacy.  

## 2011-06-12 ENCOUNTER — Other Ambulatory Visit: Payer: Self-pay | Admitting: *Deleted

## 2011-06-13 MED ORDER — ZOLPIDEM TARTRATE 10 MG PO TABS: 10.0000 mg | ORAL_TABLET | Freq: Every evening | ORAL | Status: DC | PRN

## 2011-06-13 NOTE — Telephone Encounter (Signed)
Medication phoned to pharmacy.  

## 2011-06-13 NOTE — Telephone Encounter (Signed)
Please call in

## 2011-06-24 ENCOUNTER — Telehealth: Payer: Self-pay | Admitting: Family Medicine

## 2011-06-24 DIAGNOSIS — M25569 Pain in unspecified knee: Secondary | ICD-10-CM

## 2011-06-24 NOTE — Telephone Encounter (Signed)
Pt's wife called and stated that her husband tried Lidocane patches from a  Friend for his knee pain and it really works great for him. He was wondering if it could be prescribed to him. I wasn't sure if he would need an appt or not to be evaluated ??

## 2011-06-25 DIAGNOSIS — M25569 Pain in unspecified knee: Secondary | ICD-10-CM | POA: Insufficient documentation

## 2011-06-25 MED ORDER — LIDOCAINE 5 % EX PTCH: 1.0000 | MEDICATED_PATCH | CUTANEOUS | Status: AC

## 2011-06-25 NOTE — Telephone Encounter (Signed)
Okay to try.  I sent it in.  It may or may not be covered, it may need a PA done.  I'll await further input from pharmacy, insurance, patient.

## 2011-06-25 NOTE — Telephone Encounter (Signed)
Patient advised.

## 2011-06-30 ENCOUNTER — Other Ambulatory Visit: Payer: Self-pay | Admitting: *Deleted

## 2011-06-30 MED ORDER — FUROSEMIDE 40 MG PO TABS: 40.0000 mg | ORAL_TABLET | Freq: Every day | ORAL | Status: DC

## 2011-08-07 ENCOUNTER — Encounter: Payer: Medicare Other | Admitting: Internal Medicine

## 2011-08-08 ENCOUNTER — Encounter: Payer: Self-pay | Admitting: Internal Medicine

## 2011-08-12 ENCOUNTER — Other Ambulatory Visit (INDEPENDENT_AMBULATORY_CARE_PROVIDER_SITE_OTHER): Payer: Medicare Other

## 2011-08-12 DIAGNOSIS — E119 Type 2 diabetes mellitus without complications: Secondary | ICD-10-CM

## 2011-08-12 LAB — COMPREHENSIVE METABOLIC PANEL
ALT: 22 U/L (ref 0–53)
AST: 21 U/L (ref 0–37)
Albumin: 3.6 g/dL (ref 3.5–5.2)
Alkaline Phosphatase: 36 U/L — ABNORMAL LOW (ref 39–117)
BUN: 22 mg/dL (ref 6–23)
Calcium: 9.2 mg/dL (ref 8.4–10.5)
Chloride: 104 mEq/L (ref 96–112)
Potassium: 4.4 mEq/L (ref 3.5–5.1)
Sodium: 139 mEq/L (ref 135–145)
Total Protein: 6.6 g/dL (ref 6.0–8.3)

## 2011-08-12 LAB — HEMOGLOBIN A1C: Hgb A1c MFr Bld: 8.4 % — ABNORMAL HIGH (ref 4.6–6.5)

## 2011-08-12 LAB — LDL CHOLESTEROL, DIRECT: Direct LDL: 205.1 mg/dL

## 2011-08-12 LAB — LIPID PANEL
Total CHOL/HDL Ratio: 6
Triglycerides: 175 mg/dL — ABNORMAL HIGH (ref 0.0–149.0)

## 2011-08-18 ENCOUNTER — Ambulatory Visit: Payer: Medicare Other | Admitting: Family Medicine

## 2011-08-21 ENCOUNTER — Ambulatory Visit (INDEPENDENT_AMBULATORY_CARE_PROVIDER_SITE_OTHER): Payer: Medicare Other | Admitting: Family Medicine

## 2011-08-21 ENCOUNTER — Encounter: Payer: Self-pay | Admitting: Family Medicine

## 2011-08-21 VITALS — BP 144/82 | HR 77 | Temp 98.2°F | Wt 256.0 lb

## 2011-08-21 DIAGNOSIS — E785 Hyperlipidemia, unspecified: Secondary | ICD-10-CM

## 2011-08-21 DIAGNOSIS — E119 Type 2 diabetes mellitus without complications: Secondary | ICD-10-CM

## 2011-08-21 NOTE — Patient Instructions (Addendum)
Use the novolog three times a day and start back on the cholesterol medicine (crestor).   Recheck A1c in 3 months and then come see me after that.  Take care.

## 2011-08-21 NOTE — Progress Notes (Signed)
Diabetes:  Using medications without difficulties:yes Hypoglycemic episodes:no Hyperglycemic episodes:no Feet problems:no Blood Sugars averaging: 180-190 on home checks, occ lower A1c 8.4 He's on low carb diet.  Eye exam is due.  Discussed.  He had been skipping a novolog dose.   Elevated Cholesterol: Using medications without problems: had skipped some doses of crestor Muscle aches: no Diet compliance: yes Exercise: "not very much." Other complaints: cut out high carb/fatty foods Lipid elevated.   He doesn't have chest pain but has some dec in exercise tolerance. He'll talk with cards about this next week.   PMH and SH reviewed  Meds, vitals, and allergies reviewed.   ROS: See HPI.  Otherwise negative.    GEN: nad, alert and oriented. Overweight.  HEENT: mucous membranes moist NECK: supple w/o LA CV: rrr. PULM: ctab, no inc wob ABD: soft, +bs EXT: no edema SKIN: no acute rash

## 2011-08-22 NOTE — Assessment & Plan Note (Signed)
>  25 min spent with face to face with patient, >50% counseling and/or coordinating care.  D/w pt about using 3 doses of novolog. Will recheck in 3 months.  D/w pt about diet and weight.  Exercise limited by knee pain, at baseline.

## 2011-08-22 NOTE — Assessment & Plan Note (Signed)
Encouraged restart of statin.  D/w pt.

## 2011-09-04 ENCOUNTER — Encounter: Payer: Self-pay | Admitting: Internal Medicine

## 2011-09-04 ENCOUNTER — Ambulatory Visit (INDEPENDENT_AMBULATORY_CARE_PROVIDER_SITE_OTHER): Payer: Medicare Other | Admitting: Internal Medicine

## 2011-09-04 VITALS — BP 150/82 | HR 72 | Ht 68.0 in | Wt 256.8 lb

## 2011-09-04 DIAGNOSIS — I5022 Chronic systolic (congestive) heart failure: Secondary | ICD-10-CM

## 2011-09-04 DIAGNOSIS — I2589 Other forms of chronic ischemic heart disease: Secondary | ICD-10-CM

## 2011-09-04 LAB — ICD DEVICE OBSERVATION
BATTERY VOLTAGE: 3.1334 V
FVT: 0
PACEART VT: 0
RV LEAD AMPLITUDE: 26.375 mv
TOT-0001: 1
TZAT-0004FASTVT: 8
TZAT-0004SLOWVT: 8
TZAT-0004SLOWVT: 8
TZAT-0005FASTVT: 88 pct
TZAT-0005SLOWVT: 88 pct
TZAT-0005SLOWVT: 91 pct
TZAT-0011SLOWVT: 10 ms
TZAT-0011SLOWVT: 10 ms
TZAT-0012FASTVT: 200 ms
TZAT-0013FASTVT: 1
TZAT-0019SLOWVT: 8 V
TZAT-0019SLOWVT: 8 V
TZAT-0020FASTVT: 1.5 ms
TZON-0003SLOWVT: 340 ms
TZON-0003VSLOWVT: 450 ms
TZST-0001FASTVT: 2
TZST-0001FASTVT: 4
TZST-0001FASTVT: 5
TZST-0001SLOWVT: 4
TZST-0003FASTVT: 25 J
TZST-0003FASTVT: 35 J
TZST-0003SLOWVT: 15 J
TZST-0003SLOWVT: 35 J
VENTRICULAR PACING ICD: 0.01 pct
VF: 0

## 2011-09-04 NOTE — Patient Instructions (Addendum)
Your physician recommends that you schedule a follow-up appointment in: 3 months is the device clinic and 12 months with Dr Ladona Ridgel   Patient to call if wants to proceed with BI-V ICD upgrade

## 2011-09-04 NOTE — Progress Notes (Signed)
HPI [name] returns today for followup. He is a 77 year old man with an ischemic cardiomyopathy, chronic systolic heart failure, status post coronary artery disease bypass grafting. The patient notes increasing dyspnea with exertion over the past 2 years but particularly over the past 3-6 months. He denies medical or dietary compliance problems. The patient notes mild peripheral edema. He has had no anginal symptoms. No syncope or recent ICD shocks. Allergies  Allergen Reactions  . Sulfa Antibiotics Rash     Current Outpatient Prescriptions  Medication Sig Dispense Refill  . aspirin 81 MG tablet Take 81 mg by mouth 2 (two) times daily.        . carvedilol (COREG) 25 MG tablet Take one and one half tablets two times a day  180 tablet  11  . Cinnamon 500 MG capsule Take 2 tablets daily by mouth       . clopidogrel (PLAVIX) 75 MG tablet Take 1 tablet (75 mg total) by mouth daily.  30 tablet  11  . diazepam (VALIUM) 10 MG tablet Take 1/2 to 1 tablet at bedtime as needed.  30 tablet  1  . furosemide (LASIX) 40 MG tablet Take 1 tablet (40 mg total) by mouth daily.  30 tablet  6  . glucose blood (ACCU-CHEK AVIVA) test strip Use one strip twice a day       . insulin aspart (NOVOLOG) 100 UNIT/ML injection Scale: sugar 100-150, 10 units. 150-200, 12 units. 200-250, 14 units. 250+, 16 units.  Use 3 times a day.      . insulin detemir (LEVEMIR FLEXPEN) 100 UNIT/ML injection Inject 20 units every morning  15 mL  3  . LIDODERM 5 % Place 1 patch onto the skin as needed.       Marland Kitchen lisinopril (PRINIVIL,ZESTRIL) 20 MG tablet Take 1 tablet (20 mg total) by mouth daily.  30 tablet  3  . metFORMIN (GLUCOPHAGE) 1000 MG tablet Take 1,000 mg by mouth 2 (two) times daily with a meal.      . nitroGLYCERIN (NITROSTAT) 0.4 MG SL tablet Place 1 tablet (0.4 mg total) under the tongue every 5 (five) minutes as needed for chest pain.  25 tablet  3  . Omega-3 Fatty Acids (OMEGA-3 PLUS PO) Take by mouth. Take 2 tablets by  mouth daily       . rosuvastatin (CRESTOR) 20 MG tablet Take 20 mg by mouth at bedtime.        Marland Kitchen SPIRIVA HANDIHALER 18 MCG inhalation capsule Place 18 mcg into inhaler and inhale as needed.       . zolpidem (AMBIEN) 10 MG tablet Take 1 tablet (10 mg total) by mouth at bedtime as needed. For insomnia, sparingly  30 tablet  2     Past Medical History  Diagnosis Date  . COPD (chronic obstructive pulmonary disease) 1993  . Diabetes mellitus, type 2 2000  . Hyperlipidemia 1993  . Hypertension 1993  . CHF (congestive heart failure)     hospital, med nonadherence 2/5-05/16/09  . CAD (coronary artery disease)     bypass graft surgery 05/2006; grafts were widely patent on cath 06/2010   . Ischemic cardiomyopathy     EF 30-55%  . Systolic heart failure     class II  . S/P ICD (internal cardiac defibrillator) procedure 2010    single  . Foot fracture 1953    left  . Nightmares     with psych eval 2012 at [facility]    ROS:  All systems reviewed and negative except as noted in the HPI.   Past Surgical History  Procedure Date  . Coronary artery bypass graft     3 vessel, class 4 CHF, non Q wave MI 05/19/06     Family History  Problem Relation Age of Onset  . Heart disease Father     MI age 9  . Diabetes Sister   . Hyperlipidemia Sister   . Cancer Maternal Grandmother     liver, cirrhosis  . Depression Neg Hx   . Alcohol abuse Neg Hx   . Drug abuse Neg Hx   . Stroke Neg Hx      History   Social History  . Marital Status: Married    Spouse Name: N/A    Number of Children: 0  . Years of Education: N/A   Occupational History  . 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 Smoking status: Former Games developer  . Smokeless tobacco: Never Used  . Alcohol Use: No     has stopped drinking as of 10/2010  . Drug Use: No  . Sexually Active: Not on file   Other Topics Concern  . 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 No wheezes or rhonchi are present. Well-healed ICD incision. HEART:  Regular rate rhythm, no murmurs, no rubs, no clicks heart sounds are distant. Abd:  soft, positive bowel sounds, no organomegally, no rebound, no guarding Ext:  2 plus pulses, no edema, no cyanosis, no clubbing Skin:  No rashes no nodules Neuro:  CN II through XII intact, motor grossly intact  EKG Review of prior ECG demonstrates sinus rhythm with left bundle branch block and a QRS duration of 180 ms DEVICE  Normal device function.  See PaceArt for details.   Assess/Plan:

## 2011-09-04 NOTE — Assessment & Plan Note (Signed)
The patient now has class III congestive heart failure and left bundle branch block despite maximal medical therapy. I've recommended upgrade from a single chamber defibrillator to a biventricular defibrillator. The risks, goals, benefits, and expectations of the procedure have been discussed with the patient. He is considering his options and will call us if he would like to proceed with upgrade of his device.

## 2011-09-04 NOTE — Assessment & Plan Note (Signed)
He denies anginal symptoms. He will continue his current medical therapy. 

## 2011-09-25 ENCOUNTER — Other Ambulatory Visit: Payer: Self-pay

## 2011-09-25 MED ORDER — METFORMIN HCL 1000 MG PO TABS: 1000.0000 mg | ORAL_TABLET | Freq: Two times a day (BID) | ORAL | Status: DC

## 2011-09-25 NOTE — Telephone Encounter (Signed)
[facility] request metformin 1000 mg # 180 x 3.

## 2011-10-15 ENCOUNTER — Other Ambulatory Visit: Payer: Self-pay | Admitting: *Deleted

## 2011-10-15 ENCOUNTER — Other Ambulatory Visit: Payer: Medicare Other

## 2011-10-15 NOTE — Telephone Encounter (Signed)
Received faxed refill request from pharmacy. Please confirm that this is what the patient is currently taking.

## 2011-10-15 NOTE — Telephone Encounter (Signed)
Please verify with patient. Make sure it is pend and not syringe.  I'll send at that point.  Thanks.

## 2011-10-17 MED ORDER — INSULIN DETEMIR 100 UNIT/ML ~~LOC~~ SOLN: SUBCUTANEOUS | Status: DC

## 2011-10-17 NOTE — Telephone Encounter (Signed)
Spoke with pharmacist and pt is using flexpen, rx sent to pharmacy by e-script

## 2011-10-20 ENCOUNTER — Other Ambulatory Visit (INDEPENDENT_AMBULATORY_CARE_PROVIDER_SITE_OTHER): Payer: Medicare Other

## 2011-10-20 DIAGNOSIS — E119 Type 2 diabetes mellitus without complications: Secondary | ICD-10-CM

## 2011-10-20 LAB — HEMOGLOBIN A1C: Hgb A1c MFr Bld: 8.1 % — ABNORMAL HIGH (ref 4.6–6.5)

## 2011-10-21 ENCOUNTER — Ambulatory Visit (INDEPENDENT_AMBULATORY_CARE_PROVIDER_SITE_OTHER): Payer: Medicare Other | Admitting: Family Medicine

## 2011-10-21 ENCOUNTER — Encounter: Payer: Self-pay | Admitting: Family Medicine

## 2011-10-21 VITALS — BP 136/62 | HR 72 | Temp 98.4°F | Wt 250.0 lb

## 2011-10-21 DIAGNOSIS — E119 Type 2 diabetes mellitus without complications: Secondary | ICD-10-CM

## 2011-10-21 NOTE — Progress Notes (Signed)
Patient had Spironolactone in his meds bag today that was not on his current meds list.  L. Sophira Rumler, CMA  He had been on spironolactone and compliant.  Added to med list.  Tolerated.    DM2 f/u.   He's down 6 lbs, intentionally.   Sugar 130 or lower in AM A1c as low as he's had it in last year. No beer, 1 glass of wine.  D/wpt about diet and weight.    Meds, vitals, and allergies reviewed.   ROS: See HPI.  Otherwise, noncontributory.  nad ncat Mmm rrr ctab abd soft, not ttp Ext w/o edema

## 2011-10-21 NOTE — Patient Instructions (Addendum)
Recheck A1c in 3 months and then come back and see me after labs. Take care.

## 2011-10-22 NOTE — Assessment & Plan Note (Signed)
He's cut back on etoh, and his A1c is as low as it has been recently.  D/w pt about checking sugar tid- he may not have dosed enough mealtime insulin (he was giving 10 per meal if not checked).  AM sugars are low enough not to change the long acting insulin.  Will recheck in 3 months.  meds and plan reviewed with patient.

## 2011-11-12 ENCOUNTER — Other Ambulatory Visit: Payer: Self-pay | Admitting: *Deleted

## 2011-11-12 MED ORDER — CARVEDILOL 25 MG PO TABS: 37.5000 mg | ORAL_TABLET | Freq: Two times a day (BID) | ORAL | Status: DC

## 2011-12-01 ENCOUNTER — Other Ambulatory Visit: Payer: Self-pay | Admitting: *Deleted

## 2011-12-01 MED ORDER — DIAZEPAM 10 MG PO TABS: ORAL_TABLET | ORAL | Status: DC

## 2011-12-01 NOTE — Telephone Encounter (Signed)
Please call in

## 2011-12-01 NOTE — Telephone Encounter (Signed)
Received faxed refill request from pharmacy. Last refill 08/21/11. Last office visit 10/21/11. Is it okay to refill medication?

## 2011-12-02 NOTE — Telephone Encounter (Signed)
Medication phoned to pharmacy.  

## 2011-12-03 ENCOUNTER — Encounter: Payer: Medicare Other | Admitting: Cardiology

## 2011-12-09 ENCOUNTER — Other Ambulatory Visit: Payer: Self-pay | Admitting: *Deleted

## 2011-12-09 MED ORDER — INSULIN ASPART 100 UNIT/ML ~~LOC~~ SOLN: SUBCUTANEOUS | Status: DC

## 2012-01-05 ENCOUNTER — Other Ambulatory Visit: Payer: Self-pay | Admitting: *Deleted

## 2012-01-05 DIAGNOSIS — R0602 Shortness of breath: Secondary | ICD-10-CM

## 2012-01-05 DIAGNOSIS — R55 Syncope and collapse: Secondary | ICD-10-CM

## 2012-01-05 HISTORY — DX: Syncope and collapse: R55

## 2012-01-05 HISTORY — DX: Shortness of breath: R06.02

## 2012-01-05 MED ORDER — LISINOPRIL 20 MG PO TABS: 20.0000 mg | ORAL_TABLET | Freq: Every day | ORAL | Status: DC

## 2012-01-06 ENCOUNTER — Other Ambulatory Visit: Payer: Self-pay | Admitting: *Deleted

## 2012-01-06 ENCOUNTER — Emergency Department (HOSPITAL_COMMUNITY): Payer: Medicare Other

## 2012-01-06 ENCOUNTER — Other Ambulatory Visit: Payer: Self-pay

## 2012-01-06 ENCOUNTER — Encounter (HOSPITAL_COMMUNITY): Payer: Self-pay | Admitting: Emergency Medicine

## 2012-01-06 ENCOUNTER — Ambulatory Visit: Payer: Medicare Other | Admitting: Family Medicine

## 2012-01-06 ENCOUNTER — Inpatient Hospital Stay (HOSPITAL_COMMUNITY)
Admission: EM | Admit: 2012-01-06 | Discharge: 2012-01-09 | DRG: 292 | Disposition: A | Discharge status: Home / Self Care | Payer: Medicare Other | Attending: Cardiology | Admitting: Cardiology

## 2012-01-06 ENCOUNTER — Telehealth: Payer: Self-pay | Admitting: Family Medicine

## 2012-01-06 DIAGNOSIS — I255 Ischemic cardiomyopathy: Secondary | ICD-10-CM | POA: Diagnosis present

## 2012-01-06 DIAGNOSIS — Z951 Presence of aortocoronary bypass graft: Secondary | ICD-10-CM

## 2012-01-06 DIAGNOSIS — Z833 Family history of diabetes mellitus: Secondary | ICD-10-CM

## 2012-01-06 DIAGNOSIS — Z79899 Other long term (current) drug therapy: Secondary | ICD-10-CM

## 2012-01-06 DIAGNOSIS — I509 Heart failure, unspecified: Secondary | ICD-10-CM | POA: Diagnosis present

## 2012-01-06 DIAGNOSIS — F4310 Post-traumatic stress disorder, unspecified: Secondary | ICD-10-CM | POA: Diagnosis present

## 2012-01-06 DIAGNOSIS — J4489 Other specified chronic obstructive pulmonary disease: Secondary | ICD-10-CM | POA: Diagnosis present

## 2012-01-06 DIAGNOSIS — I2510 Atherosclerotic heart disease of native coronary artery without angina pectoris: Secondary | ICD-10-CM | POA: Diagnosis present

## 2012-01-06 DIAGNOSIS — N183 Chronic kidney disease, stage 3 unspecified: Secondary | ICD-10-CM | POA: Diagnosis present

## 2012-01-06 DIAGNOSIS — I252 Old myocardial infarction: Secondary | ICD-10-CM

## 2012-01-06 DIAGNOSIS — E1129 Type 2 diabetes mellitus with other diabetic kidney complication: Secondary | ICD-10-CM | POA: Diagnosis present

## 2012-01-06 DIAGNOSIS — Z794 Long term (current) use of insulin: Secondary | ICD-10-CM

## 2012-01-06 DIAGNOSIS — Z23 Encounter for immunization: Secondary | ICD-10-CM

## 2012-01-06 DIAGNOSIS — J449 Chronic obstructive pulmonary disease, unspecified: Secondary | ICD-10-CM | POA: Diagnosis present

## 2012-01-06 DIAGNOSIS — Z882 Allergy status to sulfonamides status: Secondary | ICD-10-CM

## 2012-01-06 DIAGNOSIS — E119 Type 2 diabetes mellitus without complications: Secondary | ICD-10-CM | POA: Diagnosis present

## 2012-01-06 DIAGNOSIS — E785 Hyperlipidemia, unspecified: Secondary | ICD-10-CM | POA: Diagnosis present

## 2012-01-06 DIAGNOSIS — Z7982 Long term (current) use of aspirin: Secondary | ICD-10-CM

## 2012-01-06 DIAGNOSIS — I10 Essential (primary) hypertension: Secondary | ICD-10-CM | POA: Diagnosis present

## 2012-01-06 DIAGNOSIS — I248 Other forms of acute ischemic heart disease: Secondary | ICD-10-CM | POA: Diagnosis present

## 2012-01-06 DIAGNOSIS — Z7902 Long term (current) use of antithrombotics/antiplatelets: Secondary | ICD-10-CM

## 2012-01-06 DIAGNOSIS — N179 Acute kidney failure, unspecified: Secondary | ICD-10-CM | POA: Diagnosis present

## 2012-01-06 DIAGNOSIS — Z8249 Family history of ischemic heart disease and other diseases of the circulatory system: Secondary | ICD-10-CM

## 2012-01-06 DIAGNOSIS — Z87891 Personal history of nicotine dependence: Secondary | ICD-10-CM

## 2012-01-06 DIAGNOSIS — I5023 Acute on chronic systolic (congestive) heart failure: Principal | ICD-10-CM | POA: Diagnosis present

## 2012-01-06 DIAGNOSIS — Z95810 Presence of automatic (implantable) cardiac defibrillator: Secondary | ICD-10-CM

## 2012-01-06 DIAGNOSIS — I129 Hypertensive chronic kidney disease with stage 1 through stage 4 chronic kidney disease, or unspecified chronic kidney disease: Secondary | ICD-10-CM | POA: Diagnosis present

## 2012-01-06 DIAGNOSIS — I5022 Chronic systolic (congestive) heart failure: Secondary | ICD-10-CM | POA: Diagnosis present

## 2012-01-06 DIAGNOSIS — I214 Non-ST elevation (NSTEMI) myocardial infarction: Secondary | ICD-10-CM

## 2012-01-06 DIAGNOSIS — I447 Left bundle-branch block, unspecified: Secondary | ICD-10-CM | POA: Diagnosis present

## 2012-01-06 DIAGNOSIS — I2489 Other forms of acute ischemic heart disease: Secondary | ICD-10-CM | POA: Diagnosis present

## 2012-01-06 HISTORY — DX: Anxiety disorder, unspecified: F41.9

## 2012-01-06 HISTORY — DX: Unspecified hearing loss, left ear: H91.92

## 2012-01-06 HISTORY — DX: Chronic kidney disease, unspecified: N18.9

## 2012-01-06 HISTORY — DX: Acute myocardial infarction, unspecified: I21.9

## 2012-01-06 HISTORY — DX: Post-traumatic stress disorder, unspecified: F43.10

## 2012-01-06 HISTORY — DX: Shortness of breath: R06.02

## 2012-01-06 HISTORY — DX: Unspecified osteoarthritis, unspecified site: M19.90

## 2012-01-06 HISTORY — DX: Localized swelling, mass and lump, unspecified upper limb: R22.30

## 2012-01-06 HISTORY — DX: Syncope and collapse: R55

## 2012-01-06 HISTORY — DX: Tinnitus, left ear: H93.12

## 2012-01-06 LAB — GLUCOSE, CAPILLARY: Glucose-Capillary: 288 mg/dL — ABNORMAL HIGH (ref 70–99)

## 2012-01-06 LAB — URINALYSIS, ROUTINE W REFLEX MICROSCOPIC
Glucose, UA: 100 mg/dL — AB
Leukocytes, UA: NEGATIVE
Protein, ur: NEGATIVE mg/dL
Urobilinogen, UA: 0.2 mg/dL (ref 0.0–1.0)

## 2012-01-06 LAB — CBC WITH DIFFERENTIAL/PLATELET
Eosinophils Absolute: 0.1 10*3/uL (ref 0.0–0.7)
Lymphs Abs: 1.1 10*3/uL (ref 0.7–4.0)
MCH: 30.9 pg (ref 26.0–34.0)
Neutrophils Relative %: 72 % (ref 43–77)
Platelets: 152 10*3/uL (ref 150–400)
RBC: 4.31 MIL/uL (ref 4.22–5.81)
WBC: 5.7 10*3/uL (ref 4.0–10.5)

## 2012-01-06 LAB — POCT I-STAT, CHEM 8
BUN: 31 mg/dL — ABNORMAL HIGH (ref 6–23)
Calcium, Ion: 1.16 mmol/L (ref 1.13–1.30)
Creatinine, Ser: 1.6 mg/dL — ABNORMAL HIGH (ref 0.50–1.35)
Glucose, Bld: 229 mg/dL — ABNORMAL HIGH (ref 70–99)
TCO2: 24 mmol/L (ref 0–100)

## 2012-01-06 LAB — PROTIME-INR: Prothrombin Time: 13.4 seconds (ref 11.6–15.2)

## 2012-01-06 MED ORDER — FUROSEMIDE 10 MG/ML IJ SOLN
80.0000 mg | Freq: Two times a day (BID) | INTRAMUSCULAR | Status: DC
  Administered 2012-01-07 – 2012-01-09 (×5): 80 mg via INTRAVENOUS
  Filled 2012-01-06 (×6): qty 8

## 2012-01-06 MED ORDER — TIOTROPIUM BROMIDE MONOHYDRATE 18 MCG IN CAPS
18.0000 ug | ORAL_CAPSULE | Freq: Every day | RESPIRATORY_TRACT | Status: DC
  Administered 2012-01-08 – 2012-01-09 (×2): 18 ug via RESPIRATORY_TRACT
  Filled 2012-01-06: qty 5

## 2012-01-06 MED ORDER — METOPROLOL TARTRATE 1 MG/ML IV SOLN
5.0000 mg | INTRAVENOUS | Status: AC
  Administered 2012-01-06 (×2): 5 mg via INTRAVENOUS
  Filled 2012-01-06 (×2): qty 5

## 2012-01-06 MED ORDER — ASPIRIN 81 MG PO TABS: 81.0000 mg | ORAL_TABLET | Freq: Every day | ORAL | Status: DC

## 2012-01-06 MED ORDER — INSULIN ASPART 100 UNIT/ML ~~LOC~~ SOLN
0.0000 [IU] | Freq: Three times a day (TID) | SUBCUTANEOUS | Status: DC
  Administered 2012-01-07: 5 [IU] via SUBCUTANEOUS
  Administered 2012-01-07: 11 [IU] via SUBCUTANEOUS
  Administered 2012-01-07: 3 [IU] via SUBCUTANEOUS
  Administered 2012-01-08: 11 [IU] via SUBCUTANEOUS
  Administered 2012-01-08: 3 [IU] via SUBCUTANEOUS
  Administered 2012-01-08: 8 [IU] via SUBCUTANEOUS

## 2012-01-06 MED ORDER — ATORVASTATIN CALCIUM 40 MG PO TABS
40.0000 mg | ORAL_TABLET | Freq: Every day | ORAL | Status: DC
  Administered 2012-01-07 – 2012-01-08 (×2): 40 mg via ORAL
  Filled 2012-01-06 (×3): qty 1

## 2012-01-06 MED ORDER — SODIUM CHLORIDE 0.9 % IV SOLN: 250.0000 mL | INTRAVENOUS | Status: DC | PRN

## 2012-01-06 MED ORDER — HEPARIN (PORCINE) IN NACL 100-0.45 UNIT/ML-% IJ SOLN
1500.0000 [IU]/h | INTRAMUSCULAR | Status: DC
  Administered 2012-01-06: 1300 [IU]/h via INTRAVENOUS
  Administered 2012-01-07 – 2012-01-09 (×3): 1500 [IU]/h via INTRAVENOUS
  Filled 2012-01-06 (×8): qty 250

## 2012-01-06 MED ORDER — DIAZEPAM 5 MG PO TABS: 5.0000 mg | ORAL_TABLET | Freq: Every day | ORAL | Status: DC | PRN

## 2012-01-06 MED ORDER — ONDANSETRON HCL 4 MG/2ML IJ SOLN: 4.0000 mg | Freq: Four times a day (QID) | INTRAMUSCULAR | Status: DC | PRN

## 2012-01-06 MED ORDER — SPIRONOLACTONE 12.5 MG HALF TABLET
12.5000 mg | ORAL_TABLET | Freq: Every day | ORAL | Status: DC
  Administered 2012-01-06: 12.5 mg via ORAL
  Filled 2012-01-06 (×2): qty 1

## 2012-01-06 MED ORDER — HEPARIN BOLUS VIA INFUSION
4000.0000 [IU] | Freq: Once | INTRAVENOUS | Status: AC
  Administered 2012-01-06: 4000 [IU] via INTRAVENOUS

## 2012-01-06 MED ORDER — ACETAMINOPHEN 325 MG PO TABS: 650.0000 mg | ORAL_TABLET | ORAL | Status: DC | PRN

## 2012-01-06 MED ORDER — CARVEDILOL 25 MG PO TABS
37.5000 mg | ORAL_TABLET | Freq: Two times a day (BID) | ORAL | Status: DC
  Administered 2012-01-07 – 2012-01-09 (×5): 37.5 mg via ORAL
  Filled 2012-01-06 (×7): qty 1

## 2012-01-06 MED ORDER — FUROSEMIDE 10 MG/ML IJ SOLN
80.0000 mg | Freq: Once | INTRAMUSCULAR | Status: AC
  Administered 2012-01-06: 80 mg via INTRAVENOUS
  Filled 2012-01-06: qty 8

## 2012-01-06 MED ORDER — NITROGLYCERIN 0.4 MG SL SUBL: 0.4000 mg | SUBLINGUAL_TABLET | SUBLINGUAL | Status: DC | PRN

## 2012-01-06 MED ORDER — LISINOPRIL 20 MG PO TABS
20.0000 mg | ORAL_TABLET | Freq: Every day | ORAL | Status: DC
  Administered 2012-01-06 – 2012-01-07 (×2): 20 mg via ORAL
  Filled 2012-01-06 (×2): qty 1

## 2012-01-06 MED ORDER — INSULIN DETEMIR 100 UNIT/ML ~~LOC~~ SOLN
20.0000 [IU] | Freq: Every morning | SUBCUTANEOUS | Status: DC
  Administered 2012-01-07 – 2012-01-08 (×2): 20 [IU] via SUBCUTANEOUS
  Filled 2012-01-06: qty 10

## 2012-01-06 MED ORDER — NITROGLYCERIN 2 % TD OINT
1.0000 [in_us] | TOPICAL_OINTMENT | Freq: Once | TRANSDERMAL | Status: AC
  Administered 2012-01-06: 1 [in_us] via TOPICAL
  Filled 2012-01-06: qty 1

## 2012-01-06 MED ORDER — SODIUM CHLORIDE 0.9 % IJ SOLN: 3.0000 mL | INTRAMUSCULAR | Status: DC | PRN

## 2012-01-06 MED ORDER — INFLUENZA VIRUS VACC SPLIT PF IM SUSP
0.5000 mL | INTRAMUSCULAR | Status: AC
  Administered 2012-01-07: 0.5 mL via INTRAMUSCULAR
  Filled 2012-01-06: qty 0.5

## 2012-01-06 MED ORDER — SODIUM CHLORIDE 0.9 % IV SOLN
Freq: Once | INTRAVENOUS | Status: AC
  Administered 2012-01-06: 17:00:00 via INTRAVENOUS

## 2012-01-06 MED ORDER — SODIUM CHLORIDE 0.9 % IJ SOLN
3.0000 mL | Freq: Two times a day (BID) | INTRAMUSCULAR | Status: DC
  Administered 2012-01-07 – 2012-01-08 (×2): 3 mL via INTRAVENOUS

## 2012-01-06 MED ORDER — CLOPIDOGREL BISULFATE 75 MG PO TABS
75.0000 mg | ORAL_TABLET | Freq: Every day | ORAL | Status: DC
  Administered 2012-01-06 – 2012-01-08 (×3): 75 mg via ORAL
  Filled 2012-01-06 (×4): qty 1

## 2012-01-06 MED ORDER — ASPIRIN 81 MG PO CHEW
81.0000 mg | CHEWABLE_TABLET | Freq: Every day | ORAL | Status: DC
  Administered 2012-01-06 – 2012-01-09 (×4): 81 mg via ORAL
  Filled 2012-01-06 (×4): qty 1

## 2012-01-06 NOTE — ED Provider Notes (Cosign Needed)
History     CSN: [id number]  Arrival date & time 01/06/12  1614   None     Chief Complaint  Patient presents with  . Shortness of Breath    (Consider location/radiation/quality/duration/timing/severity/associated sxs/prior treatment) Patient is a 77 y.o. male presenting with shortness of breath.  Shortness of Breath  The current episode started yesterday. The onset was sudden. Episode frequency: Pt says he had shortness of breath yesterday with mild chest pain, fell down and had hard time getting up. The problem has been unchanged. The problem is severe. Relieved by: He took nitroglycerin and aspirin yesterday, with some relief.  He had persistant shortness of breath today, and therefore called his PCP, Crawford Givens, M.D. who advised ED evaluation. Nothing aggravates the symptoms. Associated symptoms include chest pain and shortness of breath. Pertinent negatives include no fever. He has had prior hospitalizations. He has had no prior intubations. There were no sick contacts. Recently, medical care has been given by a specialist. Services received include medications given.    Past Medical History  Diagnosis Date  . COPD (chronic obstructive pulmonary disease) 1993  . Diabetes mellitus, type 2 2000  . Hyperlipidemia 1993  . Hypertension 1993  . CHF (congestive heart failure)     hospital, med nonadherence 2/5-05/16/09  . CAD (coronary artery disease)     bypass graft surgery 05/2006; grafts were widely patent on cath 06/2010   . Ischemic cardiomyopathy     EF 30-55%  . Systolic heart failure     class II  . S/P ICD (internal cardiac defibrillator) procedure 2010    single  . Foot fracture 1953    left  . Nightmares     with psych eval 2012 at [facility]    Past Surgical History  Procedure Date  . Coronary artery bypass graft     3 vessel, class 4 CHF, non Q wave MI 05/19/06    Family History  Problem Relation Age of Onset  . Heart disease Father     MI age 33  . Diabetes  Sister   . Hyperlipidemia Sister   . Cancer Maternal Grandmother     liver, cirrhosis  . Depression Neg Hx   . Alcohol abuse Neg Hx   . Drug abuse Neg Hx   . Stroke Neg Hx     History  Substance Use Topics  . Smoking status: Former Games developer  . Smokeless tobacco: Never Used  . Alcohol Use: No     has stopped drinking as of 10/2010      Review of Systems  Constitutional: Negative.  Negative for fever and chills.  HENT: Negative.   Eyes: Negative.   Respiratory: Positive for shortness of breath.   Cardiovascular: Positive for chest pain and leg swelling.  Gastrointestinal: Negative.   Genitourinary: Negative.   Musculoskeletal: Negative.   Skin: Negative.   Neurological: Negative.   Psychiatric/Behavioral: Negative.     Allergies  Sulfa antibiotics  Home Medications   Current Outpatient Rx  Name Route Sig Dispense Refill  . ASPIRIN 81 MG PO TABS Oral Take 81 mg by mouth 2 (two) times daily.      Marland Kitchen CARVEDILOL 25 MG PO TABS Oral Take 1.5 tablets (37.5 mg total) by mouth 2 (two) times daily with a meal. 90 tablet 11  . CINNAMON 500 MG PO CAPS  Take 2 tablets daily by mouth     . CLOPIDOGREL BISULFATE 75 MG PO TABS Oral Take 1 tablet (75 mg total)  by mouth daily. 30 tablet 11  . DIAZEPAM 10 MG PO TABS  Take 1/2 to 1 tablet at bedtime as needed. 30 tablet 1  . FUROSEMIDE 40 MG PO TABS Oral Take 1 tablet (40 mg total) by mouth daily. 30 tablet 6  . GLUCOSE BLOOD VI STRP  Use one strip twice a day     . INSULIN ASPART 100 UNIT/ML [place] SOLN  Scale: sugar 100-150, 10 units. 150-200, 12 units. 200-250, 14 units. 250+, 16 units.  Use 3 times a day. 1 vial 1  . INSULIN DETEMIR 100 UNIT/ML [place] SOLN  Inject 20 units every morning 15 mL 3    Dispense 5 pens  . LIDODERM 5 % EX PTCH Transdermal Place 1 patch onto the skin as needed.     Marland Kitchen LISINOPRIL 20 MG PO TABS Oral Take 1 tablet (20 mg total) by mouth daily. 30 tablet 9  . METFORMIN HCL 1000 MG PO TABS Oral Take 1 tablet (1,000 mg  total) by mouth 2 (two) times daily with a meal. 180 tablet 3  . NITROGLYCERIN 0.4 MG SL SUBL Sublingual Place 1 tablet (0.4 mg total) under the tongue every 5 (five) minutes as needed for chest pain. 25 tablet 3  . OMEGA-3 PLUS PO Oral Take by mouth. Take 2 tablets by mouth daily     . ROSUVASTATIN CALCIUM 20 MG PO TABS Oral Take 20 mg by mouth at bedtime.      Marland Kitchen SPIRIVA HANDIHALER 18 MCG IN CAPS Inhalation Place 18 mcg into inhaler and inhale as needed.     Marland Kitchen SPIRONOLACTONE 25 MG PO TABS  Take one half (12.5 mg) tablet by mouth daily.    Marland Kitchen ZOLPIDEM TARTRATE 10 MG PO TABS Oral Take 1 tablet (10 mg total) by mouth at bedtime as needed. For insomnia, sparingly 30 tablet 2    BP 144/74  Pulse 89  Temp 98.4 F (36.9 C) (Oral)  Resp 20  SpO2 90%  Physical Exam  Nursing note and vitals reviewed. Constitutional:       Obese elderly man breathing oxygen.  No distress at rest.  HENT:  Head: Normocephalic and atraumatic.  Right Ear: External ear normal.  Left Ear: External ear normal.  Mouth/Throat: Oropharynx is clear and moist.  Eyes: Conjunctivae normal and EOM are normal. Pupils are equal, round, and reactive to light.  Neck: Normal range of motion. Neck supple.  Cardiovascular: Normal rate, regular rhythm and normal heart sounds.   Pulmonary/Chest:       Bibasilar rales halfway up his back.  Abdominal: Soft. Bowel sounds are normal.  Musculoskeletal: He exhibits edema.       2+ bilateral ankle edema.  Skin: Skin is warm and dry.  Psychiatric: He has a normal mood and affect. His behavior is normal.    ED Course  CRITICAL CARE Performed by: Osvaldo Human Authorized by: Osvaldo Human Total critical care time: 30 minutes Critical care was necessary to treat or prevent imminent or life-threatening deterioration of the following conditions: cardiac failure (Evaluation and treatment of pt with shortness of breath and chest pain, who on exam and testing had congestive heart  failure and an NSTEMI.). Critical care was time spent personally by me on the following activities: development of treatment plan with patient or surrogate, discussions with consultants, evaluation of patient's response to treatment, examination of patient, obtaining history from patient or surrogate, ordering and performing treatments and interventions, ordering and review of laboratory studies, ordering  and review of radiographic studies, re-evaluation of patient's condition and review of old charts.   (including critical care time)  4:29 PM  Date: 01/06/2012  Rate:90  Rhythm: normal sinus rhythm  QRS Axis: normal  Intervals: PR prolonged and QT prolonged  ST/T Wave abnormalities: ST depressions laterally  Conduction Disutrbances:left bundle branch block  Narrative Interpretation: Abnormal EKG  Old EKG Reviewed: unchanged   4:51 PM Pt seen --> physical exam performed.  Lab workup ordered.  IV Lasix and topical NTG ordered.  Nasal oxygen ordered.  5:49 PM Results for orders placed during the hospital encounter of 01/06/12  PROTIME-INR      Component Value Range   Prothrombin Time 13.4  11.6 - 15.2 seconds   INR 1.03  0.00 - 1.49  APTT      Component Value Range   aPTT 29  24 - 37 seconds  POCT I-STAT, CHEM 8      Component Value Range   Sodium 139  135 - 145 mEq/L   Potassium 4.4  3.5 - 5.1 mEq/L   Chloride 106  96 - 112 mEq/L   BUN 31 (*) 6 - 23 mg/dL   Creatinine, Ser 1.61 (*) 0.50 - 1.35 mg/dL   Glucose, Bld 096 (*) 70 - 99 mg/dL   Calcium, Ion 0.45  4.09 - 1.30 mmol/L   TCO2 24  0 - 100 mmol/L   Hemoglobin 13.6  13.0 - 17.0 g/dL   HCT 81.1  91.4 - 78.2 %  POCT I-STAT TROPONIN I      Component Value Range   Troponin i, poc 0.26 (*) 0.00 - 0.08 ng/mL   Comment NOTIFIED PHYSICIAN     Comment 3            Dg Chest Port 1 View  01/06/2012  *RADIOLOGY REPORT*  Clinical Data: Acute shortness of breath.  PORTABLE CHEST - 1 VIEW  Comparison: 06/29/2010  Findings: The  cardiopericardial silhouette is enlarged. There is pulmonary vascular congestion without overt pulmonary edema. Interval development of diffuse interstitial opacity suggesting edema.  Left AICD remains in place.  The patient is status post CABG.  IMPRESSION: Cardiomegaly with vascular congestion and diffuse interstitial pulmonary edema.   Original Report Authenticated By: ERIC A. MANSELL, M.D.     TNI elevated at 0.26.  Pt has NSTEMI.  Call to [facility] to admit pt.    6:39 PM Spoke to Pharmacy to initiate heparin infusion for NSTEMI.  Pt has diuresed, is feeling much better.   1. NSTEMI (non-ST elevated myocardial infarction)   2. Congestive heart failure           Carleene Cooper III, MD 01/06/12 [phone]

## 2012-01-06 NOTE — Consult Note (Signed)
ANTICOAGULATION CONSULT NOTE - Initial Consult  Pharmacy Consult for Heparin Indication: NSTEMI  Allergies  Allergen Reactions  . Sulfa Antibiotics Swelling and Rash    Hands swell    Patient Measurements: Height: 5' 8.11" (173 cm) Weight: 250 lb (113.4 kg) IBW/kg (Calculated) : 68.65  Heparin Dosing Weight: 93.5kg  Vital Signs: Temp: 98.4 F (36.9 C) (10/01 1621) Temp src: Oral (10/01 1621) BP: 144/74 mmHg (10/01 1621) Pulse Rate: 89  (10/01 1621)  Labs:  Basename 01/06/12 1731 01/06/12 1702  HGB 13.6 13.3  HCT 40.0 39.5  PLT -- 152  APTT -- 29  LABPROT -- 13.4  INR -- 1.03  HEPARINUNFRC -- --  CREATININE 1.60* --  CKTOTAL -- --  CKMB -- --  TROPONINI -- --    Estimated Creatinine Clearance: 48.9 ml/min (by C-G formula based on Cr of 1.6).   Medical History: Past Medical History  Diagnosis Date  . COPD (chronic obstructive pulmonary disease) 1993  . Diabetes mellitus, type 2 2000  . Hyperlipidemia 1993  . Hypertension 1993  . CHF (congestive heart failure)     hospital, med nonadherence 2/5-05/16/09  . CAD (coronary artery disease)     bypass graft surgery 05/2006; grafts were widely patent on cath 06/2010   . Ischemic cardiomyopathy     EF 25-30%  . Systolic heart failure     class II  . S/P ICD (internal cardiac defibrillator) procedure 2010    single  . Foot fracture 1953    left  . Nightmares     with psych eval 2012 at [facility]   Medications:  No anticoagulants pta  Assessment: 77yom with history of CAD s/p CABG and ICM presents to ED with SOB. First troponin is elevated. He will begin heparin for NSTEMI. Serum creatinine is elevated at 1.6. Baseline CBC wnl.  Goal of Therapy:  Heparin level 0.3-0.7 units/ml Monitor platelets by anticoagulation protocol: Yes   Plan:  1) Heparin bolus 4000 units x 1 2) Heparin drip at 1300 units/hr 3) 8h heparin level 4) Daily heparin level and CBC  Fredrik Rigger 01/06/2012,6:46 PM

## 2012-01-06 NOTE — ED Notes (Signed)
Pt is aware that we need urine specimen. Urinal provided.

## 2012-01-06 NOTE — ED Notes (Signed)
Per EMS: pt c/o increased SOB starting yesterday; pt denies pain; pt sts took ASA and nitro yesterday with some relief from SOB; pt denies cough, fever or swelling

## 2012-01-06 NOTE — H&P (Signed)
Physician History and Physical  Patient ID: [name] [name] MRN: [id number] DOB/AGE: 08-Feb-1935 77 y.o. Admit date: 01/06/2012  Primary Care Physician:Graham Para March, MD Primary Cardiologist  Sharrell Ku  HPI:  The patient presents with increasing shortness of breath. He has known ischemic cardiomyopathy. He is followed by Dr. Ladona Ridgel. He was seen last in our office in May, 2013. He has an ICD in place. Does have coronary artery disease. He's post CABG in the past. His ejection fraction by history is in the 25% range.  The patient says that at times he feel short of breath lying down. He has been told by another physician that this may be anxiety. More recently this began occurring on a more regular basis. Currently it sounds like PND and orthopnea. He had increasing shortness of breath yesterday. With this he felt fatigued. He had a mild fall. He did not have any injuries. There is no evidence that he had a true syncopal episode.  The patient denies significant chest pain. He did have some mild chest tightness when he was short of breath.   Past Medical History  Diagnosis Date  . COPD (chronic obstructive pulmonary disease) 1993  . Diabetes mellitus, type 2 2000  . Hyperlipidemia 1993  . Hypertension 1993  . CHF (congestive heart failure)     hospital, med nonadherence 2/5-05/16/09  . CAD (coronary artery disease)     bypass graft surgery 05/2006; grafts were widely patent on cath 06/2010   . Ischemic cardiomyopathy     EF 25-30%  . Systolic heart failure     class II  . S/P ICD (internal cardiac defibrillator) procedure 2010    single  . Foot fracture 1953    left  . Nightmares     with psych eval 2012 at [facility] History  Problem Relation Age of Onset  . Heart disease Father     MI age 68  . Diabetes Sister   . Hyperlipidemia Sister   . Cancer Maternal Grandmother     liver, cirrhosis  . Depression Neg Hx   . Alcohol abuse Neg Hx   . Drug abuse Neg Hx   . Stroke  Neg Hx     History   Social History  . Marital Status: Married    Spouse Name: N/A    Number of Children: 0  . Years of Education: N/A   Occupational History  . 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 Smoking status: Former Games developer  . Smokeless tobacco: Never Used  . Alcohol Use: No     has stopped drinking as of 10/2010  . Drug Use: No  . Sexually Active: Not on file   Other Topics Concern  . 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 Coronary artery bypass graft     3 vessel, class 4 CHF, non Q wave MI 05/19/06    Review of systems   The patient denies fever, Chills, headache, rash, change in vision, change in hearing, nausea vomiting, urinary symptoms. All other systems are reviewed and are negative.  Physical Exam: Blood pressure 144/74, pulse 89, temperature 98.4 F (36.9 C), temperature source Oral, resp. rate 20, height 5' 8.11" (1.73 m), weight 250 lb (113.4 kg), SpO2 90.00%.  The patient is oriented to person time and place. Affect is normal. His wife is in the room. He is art he had some diuresis and is feeling better. He has a thick neck. I cannot assess for jugular venous distention. There are no obvious carotid bruits. Head is atraumatic. Lungs reveal basilar rales. There is no respiratory distress at this time. He is wearing oxygen with nasal prongs. Cardiac exam reveals S1 and S2. There no clicks or significant murmurs. The abdomen is soft. He has 1-2+ peripheral edema bilaterally in the lower extremities. There no musculoskeletal deformities. There are no skin rashes.  Labs:    Lab Results  Component Value Date   WBC 5.7 01/06/2012   HGB 13.6 01/06/2012   HCT 40.0 01/06/2012   MCV 91.6 01/06/2012   PLT 152 01/06/2012    Lab 01/06/12 1731  NA 139    K 4.4  CL 106  CO2 --  BUN 31*  CREATININE 1.60*  CALCIUM --  PROT --  BILITOT --  ALKPHOS --  ALT --  AST --  GLUCOSE 229*   Lab Results  Component Value Date   CKTOTAL 131 06/30/2010   CKMB 3.6 06/30/2010   TROPONINI  Value: 0.05        NO INDICATION OF MYOCARDIAL INJURY. 06/30/2010        Radiology: Dg Chest Port 1 View  01/06/2012  *RADIOLOGY REPORT*  Clinical Data: Acute shortness of breath.  PORTABLE CHEST - 1 VIEW  Comparison: 06/29/2010  Findings: The cardiopericardial silhouette is enlarged. There is pulmonary vascular congestion without overt pulmonary edema. Interval development of diffuse interstitial opacity suggesting edema.  Left AICD remains in place.  The patient is status post CABG.  IMPRESSION: Cardiomegaly with vascular congestion and diffuse interstitial pulmonary edema.   Original Report Authenticated By: ERIC A. MANSELL, M.D.    EKG: I have reviewed the EKG. There is normal sinus rhythm. There is an interventricular conduction delay of the left bundle branch block type  ASSESSMENT AND PLAN:   Active Problems:   CAD, AUTOLOGOUS BYPASS GRAFT     The patient has a history of coronary disease post CABG. There is no definite evidence yet of an MI. Enzymes will be checked.   CARDIOMYOPATHY, ISCHEMIC    Patient has an ischemic cardiomyopathy. Ejection fraction historically his 25%. We will relook at his LV function has an echo has not been done in a while.   AUTOMATIC IMPLANTABLE CARDIAC DEFIBRILLATOR SITU    Patient has an ICD in place it is followed by our team. He does not give a history of a firing of his ICD.   Acute on chronic systolic CHF (congestive heart failure)    This is the major problem at this time. The patient has developed volume overloaded now has acute on chronic systolic heart failure. He is on all the appropriate home medications. We will use IV diuretics to diuresis him. He is already beginning to diuresis.   Chronic kidney disease     The patient has chronic kidney disease. His creatinine is 1.6. This is close to his baseline.  Signed: Willa Rough 01/06/2012, 6:52 PM

## 2012-01-06 NOTE — Telephone Encounter (Signed)
If he has any CP or SOB, then needs to call 911.

## 2012-01-06 NOTE — Telephone Encounter (Signed)
Caller: [name]/Patient; Patient Name: [name]; PCP: Crawford Givens Clelia Croft) [facility]); Best Callback Phone Number: [phone]; Reason for call: Caller relates patient is having difficulty breathing, he fainted or fell on the way back into the house on 01/05/12 around 1500.  He took Nitroglycerine and he is still "woozy". She reports he is currently having difficulty breathing which triggers 911 disposition.    Advised 911 per Breating Problems protocol.   Caller states patient refuses to call 911 and insists that his breathing is fine, that he has appointment with his doctor later today and if he seems worse she will call 911.

## 2012-01-06 NOTE — Telephone Encounter (Signed)
Received faxed refill request from pharmacy.  Last office visit 10/21/11. Patient requested #[phone]. Is it okay to refill medication?

## 2012-01-06 NOTE — Telephone Encounter (Signed)
Patient and [name] notified as instructed by telephone. Pt agreed to go to ER for eval and [name] agreed to call 911.

## 2012-01-06 NOTE — ED Notes (Signed)
Pt. A.O. X 4. Sitting up in bed watching tv. Family at bedside. Requesting meal tray. Denies N/V/D. Denies pain. Skin warm and dry. NAD. Vitals stable. Nasal cannula in place. Respirations even and regular.

## 2012-01-06 NOTE — ED Notes (Signed)
Meal tray arrived

## 2012-01-07 DIAGNOSIS — I059 Rheumatic mitral valve disease, unspecified: Secondary | ICD-10-CM

## 2012-01-07 DIAGNOSIS — I214 Non-ST elevation (NSTEMI) myocardial infarction: Secondary | ICD-10-CM

## 2012-01-07 LAB — BASIC METABOLIC PANEL
BUN: 33 mg/dL — ABNORMAL HIGH (ref 6–23)
GFR calc Af Amer: 44 mL/min — ABNORMAL LOW (ref >90)
GFR calc non Af Amer: 38 mL/min — ABNORMAL LOW (ref >90)
Potassium: 3.7 mEq/L (ref 3.5–5.1)
Sodium: 139 mEq/L (ref 135–145)

## 2012-01-07 LAB — MRSA PCR SCREENING: MRSA by PCR: NEGATIVE

## 2012-01-07 LAB — GLUCOSE, CAPILLARY: Glucose-Capillary: 220 mg/dL — ABNORMAL HIGH (ref 70–99)

## 2012-01-07 LAB — HEPARIN LEVEL (UNFRACTIONATED): Heparin Unfractionated: 0.51 IU/mL (ref 0.30–0.70)

## 2012-01-07 LAB — TROPONIN I: Troponin I: 0.55 ng/mL (ref <0.30)

## 2012-01-07 MED ORDER — NITROGLYCERIN 2 % TD OINT
1.0000 [in_us] | TOPICAL_OINTMENT | Freq: Four times a day (QID) | TRANSDERMAL | Status: DC
  Administered 2012-01-07 – 2012-01-09 (×7): 1 [in_us] via TOPICAL
  Filled 2012-01-07: qty 30

## 2012-01-07 MED ORDER — NITROGLYCERIN 0.4 MG SL SUBL: 0.4000 mg | SUBLINGUAL_TABLET | SUBLINGUAL | Status: DC | PRN

## 2012-01-07 MED ORDER — LISINOPRIL 10 MG PO TABS
10.0000 mg | ORAL_TABLET | Freq: Every day | ORAL | Status: DC
  Administered 2012-01-08 – 2012-01-09 (×2): 10 mg via ORAL
  Filled 2012-01-07 (×2): qty 1

## 2012-01-07 NOTE — Progress Notes (Signed)
Utilization Review Completed.  

## 2012-01-07 NOTE — Progress Notes (Signed)
Inpatient Diabetes Program Recommendations  AACE/ADA: New Consensus Statement on Inpatient Glycemic Control (2013)  Target Ranges:  Prepandial:   less than 140 mg/dL      Peak postprandial:   less than 180 mg/dL (1-2 hours)      Critically ill patients:  140 - 180 mg/dL   Reason for Visit: Hyperglycemia Results for AAREN, ATALLAH (MRN [id number]) as of 01/07/2012 16:48  Ref. Range 10/20/2011 11:37  Hemoglobin A1C Latest Range: 4.6-6.5 % 8.1 (H)  Results for LACY, TAGLIERI (MRN [id number]) as of 01/07/2012 16:48  Ref. Range 01/06/2012 21:12 01/07/2012 06:27 01/07/2012 11:27 01/07/2012 16:13  Glucose-Capillary Latest Range: 70-99 mg/dL 696 (H) 295 (H) 284 (H) 220 (H)     Inpatient Diabetes Program Recommendations Insulin - Basal: Increase Levemir to 25 units QAM Insulin - Meal Coverage: Add Novolog 3 units tidwc for meal coverage insulin if pt eats >50% meal Diet: Add CHO mod med to heart healthy diet  Note: Will continue to follow.

## 2012-01-07 NOTE — Progress Notes (Signed)
Patient Name: [name] [name]      SUBJECTIVE: admitted with CHF in setting of ischemic cardiomyoapthy prior cabg and ICD with LBBB He has + Tn and they were notably normal 3/12 at which time cath>>patent grafts  heh as modest chronic renal insufficiency   deinies chest pain on admission  Past Medical History  Diagnosis Date  . COPD (chronic obstructive pulmonary disease) 1993  . Hyperlipidemia 1993  . Hypertension 1993  . CAD (coronary artery disease)     bypass graft surgery 05/2006; grafts were widely patent on cath 06/2010   . Ischemic cardiomyopathy     EF 25-30%  . Systolic heart failure     class II  . S/P ICD (internal cardiac defibrillator) procedure 2010    single  . Foot fracture 1953; 1962    left; left  . Chronic kidney disease   . ICD (implantable cardiac defibrillator) in place 2010  . CHF (congestive heart failure)     hospital, med nonadherence 2/5-05/16/09  . Myocardial infarction 2008; 01/06/2012  . Syncope and collapse 01/05/2012  . Shortness of breath 01/05/2012    "all the time; been going on for awhile"  . Diabetes mellitus, type 2 2000  . Arthritis     "hands" (01/06/2012)  . Elbow mass     left; "just noticed it ~ 2 wk ago"  (01/06/2012)  . Anxiety   . Nightmares     with psych eval 2012 at [facility]  . PTSD (post-traumatic stress disorder)   . Tinnitus of left ear   . Deafness in left ear     PHYSICAL EXAM Filed Vitals:   01/06/12 1858 01/06/12 1924 01/06/12 2100 01/07/12 0601  BP: 158/99 147/91 152/62 145/81  Pulse: 77 76 87 89  Temp:  98.6 F (37 C) 98.3 F (36.8 C) 97.9 F (36.6 C)  TempSrc:  Oral Oral Oral  Resp: 21 21 20 18   Height:   5\' 8"  (1.727 m)   Weight:   247 lb 12.8 oz (112.4 kg) 247 lb 8 oz (112.265 kg)  SpO2: 99% 97% 95% 96%   Well developed and nourished in no acute distress HENT normal Neck supple with JVP-flat Clear Regular rate and rhythm, no murmurs or gallops Abd-soft with active BS No Clubbing cyanosis  edema Skin-warm and dry A & Oriented  Grossly normal sensory and motor function  TELEMETRY: Reviewed telemetry pt in nsr    Intake/Output Summary (Last 24 hours) at 01/07/12 1034 Last data filed at 01/07/12 1000  Gross per 24 hour  Intake 433.72 ml  Output   1975 ml  Net -1541.28 ml    LABS: Basic Metabolic Panel:  Lab 01/07/12 2725 01/06/12 1731  NA 139 139  K 3.7 4.4  CL 101 106  CO2 25 --  GLUCOSE 208* 229*  BUN 33* 31*  CREATININE 1.68* 1.60*  CALCIUM 9.1 --  MG -- --  PHOS -- --   Cardiac Enzymes:  Basename 01/07/12 0311 01/06/12 2130  CKTOTAL -- --  CKMB -- --  CKMBINDEX -- --  TROPONINI 1.04* 1.04*   CBC:  Lab 01/06/12 1731 01/06/12 1702  WBC -- 5.7  NEUTROABS -- 4.1  HGB 13.6 13.3  HCT 40.0 39.5  MCV -- 91.6  PLT -- 152   PROTIME:  Basename 01/06/12 1702  LABPROT 13.4  INR 1.03   Liver Function Tests: No results found for this basename: AST:2,ALT:2,ALKPHOS:2,BILITOT:2,PROT:2,ALBUMIN:2 in the last 72 hours No results found for this basename: LIPASE:2,AMYLASE:2 in  the last 72 hours BNP: BNP (last 3 results)  Basename 01/06/12 1702  PROBNP 5387.0*      ASSESSMENT AND PLAN:  Patient Active Hospital Problem List: CAD, AUTOLOGOUS BYPASS GRAFT (06/04/2008)   CARDIOMYOPATHY, ISCHEMIC (08/10/2006)   AUTOMATIC IMPLANTABLE CARDIAC DEFIBRILLATOR SITU (03/18/2010)   Acute on chronic systolic CHF (congestive heart failure) (01/06/2012)   Chronic kidney disease ()   NSTEMI (non-ST elevated myocardial infarction) (01/07/2012)   Pt will need catheterization    Will defer until Cr stablizes, hopefully Friday and continue hep, plavix coreg for now.  Will decrease lisinopril and hold aldactone to see if can improve renal function Discussed CRT to be dne subsequently Begin nitrogl for augmented bp control And recheck Tn    Signed, Sherryl Manges MD  01/07/2012

## 2012-01-07 NOTE — Progress Notes (Signed)
CRITICAL VALUE ALERT  Critical value received:  Troponin= 1.04  Date of notification:  01/06/12  Time of notification:  2130  Critical value read back:yes  Nurse who received alert:  R. Pati Thinnes  MD notified (1st page):  [email or web address]  Time of first page:  2145  MD notified (2nd page):  Time of second page:  Responding MD:  [email or web address]  Time MD responded:  2150

## 2012-01-07 NOTE — Telephone Encounter (Signed)
Sent!

## 2012-01-07 NOTE — Progress Notes (Signed)
ANTICOAGULATION CONSULT NOTE - Follow Up Consult  Pharmacy Consult for  Heparin Indication: NSTEMI  Allergies  Allergen Reactions  . Sulfa Antibiotics Swelling and Rash    Hands swell    Patient Measurements: Height: 5\' 8"  (172.7 cm) Weight: 247 lb 8 oz (112.265 kg) (scale c) IBW/kg (Calculated) : 68.4  Heparin Dosing Weight: 93.5 kg  Vital Signs: Temp: 97.9 F (36.6 C) (10/02 0601) Temp src: Oral (10/02 0601) BP: 145/81 mmHg (10/02 0601) Pulse Rate: 89  (10/02 0601)  Labs:  Basename 01/07/12 1101 01/07/12 1100 01/07/12 0311 01/06/12 2130 01/06/12 1731 01/06/12 1702  HGB -- -- -- -- 13.6 13.3  HCT -- -- -- -- 40.0 39.5  PLT -- -- -- -- -- 152  APTT -- -- -- -- -- 29  LABPROT -- -- -- -- -- 13.4  INR -- -- -- -- -- 1.03  HEPARINUNFRC 0.51 -- 0.25* -- -- --  CREATININE -- -- 1.68* -- 1.60* --  CKTOTAL -- -- -- -- -- --  CKMB -- -- -- -- -- --  TROPONINI -- 0.55* 1.04* 1.04* -- --    Estimated Creatinine Clearance: 46.2 ml/min (by C-G formula based on Cr of 1.68).  Assessment:   Heparin level is now therapeutic on 1500 units/hr.   Initial platelet count 152K;  154-173 in March 2012.   No cath today due to renal function.  Creatinine 1.5 in May 2013.  Sprironolactone discontinued, Lisinopril dose decreased. On Lasix 80 mg IV BID.    Goal of Therapy:  Heparin level 0.3-0.7 units/ml Monitor platelets by anticoagulation protocol: Yes   Plan:   Continue heparin drip at 1500 units/hr.  Next heparin level and CBC in am.  Dennie Fetters, RPh Pager: [phone] 01/07/2012,12:38 PM

## 2012-01-07 NOTE — Progress Notes (Signed)
01/06/2012 @ 2130. Dr bensimhon made aware of critical troponin value of 1.04 orders received  Lopressor 5 mg to be given by 2 doses every 10 min IV . Pt asymptomatic no c/o cheat pains nor sob. Continued to observed patient closely

## 2012-01-07 NOTE — Progress Notes (Signed)
ANTICOAGULATION CONSULT NOTE - Follow Up Consult  Pharmacy Consult for heparin Indication: chest pain/ACS  Labs:  [facility] 01/07/12 0311 01/06/12 2130 01/06/12 1731 01/06/12 1702  HGB -- -- 13.6 13.3  HCT -- -- 40.0 39.5  PLT -- -- -- 152  APTT -- -- -- 29  LABPROT -- -- -- 13.4  INR -- -- -- 1.03  HEPARINUNFRC 0.25* -- -- --  CREATININE -- -- 1.60* --  CKTOTAL -- -- -- --  CKMB -- -- -- --  TROPONINI -- 1.04* -- --    Assessment: 77yo male subtherapeutic on heparin with initial dosing for CP, now with + troponin; noted that heparin was started later than expected so lab was drawn <5hr after bolus so true level likely lower.  Goal of Therapy:  Heparin level 0.3-0.7 units/ml   Plan:  Will increase heparin gtt by 2 units/kg/hr to 1500 units/hr and check level in 8hr.  Colleen Can PharmD BCPS 01/07/2012,4:00 AM

## 2012-01-07 NOTE — Progress Notes (Signed)
  Echocardiogram 2D Echocardiogram has been performed.  [name] [name] 01/07/2012, 9:07 AM

## 2012-01-08 LAB — GLUCOSE, CAPILLARY
Glucose-Capillary: 186 mg/dL — ABNORMAL HIGH (ref 70–99)
Glucose-Capillary: 273 mg/dL — ABNORMAL HIGH (ref 70–99)
Glucose-Capillary: 350 mg/dL — ABNORMAL HIGH (ref 70–99)

## 2012-01-08 LAB — CBC
HCT: 39.4 % (ref 39.0–52.0)
Hemoglobin: 13.6 g/dL (ref 13.0–17.0)
MCH: 32.1 pg (ref 26.0–34.0)
MCV: 92.9 fL (ref 78.0–100.0)
Platelets: 132 10*3/uL — ABNORMAL LOW (ref 150–400)
RBC: 4.24 MIL/uL (ref 4.22–5.81)
WBC: 5.2 10*3/uL (ref 4.0–10.5)

## 2012-01-08 LAB — BASIC METABOLIC PANEL
CO2: 26 mEq/L (ref 19–32)
Chloride: 100 mEq/L (ref 96–112)
Glucose, Bld: 191 mg/dL — ABNORMAL HIGH (ref 70–99)
Potassium: 3.7 mEq/L (ref 3.5–5.1)
Sodium: 136 mEq/L (ref 135–145)

## 2012-01-08 LAB — HEPARIN LEVEL (UNFRACTIONATED): Heparin Unfractionated: 0.53 IU/mL (ref 0.30–0.70)

## 2012-01-08 NOTE — Progress Notes (Signed)
Patient ID: [name] [name], male   DOB: [birth year], 77 y.o.   MRN: [id number] Subjective:   feel great  Objective:  Vital Signs in the last 24 hours: Temp:  [97.7 F (36.5 C)-98.2 F (36.8 C)] 97.9 F (36.6 C) (10/03 0955) Pulse Rate:  [60-72] 60  (10/03 0955) Resp:  [18-20] 18  (10/03 0955) BP: (121-154)/(64-82) 121/64 mmHg (10/03 0955) SpO2:  [98 %-100 %] 100 % (10/03 0955) Weight:  [247 lb 6.4 oz (112.22 kg)] 247 lb 6.4 oz (112.22 kg) (10/03 0525)  Intake/Output from previous day: 10/02 0701 - 10/03 0700 In: 990 [P.O.:840; I.V.:150] Out: 2850 [Urine:2850] Intake/Output from this shift: Total I/O In: 363 [P.O.:360; I.V.:3] Out: 925 [Urine:925]  Physical Exam: Well appearing "77 yo man, NAD HEENT: Unremarkable Neck:  7 cm JVD, no thyromegally Lungs:  Clear with no wheezes and minimal rales. HEART:  Regular rate rhythm, no murmurs, no rubs, no clicks Abd:  Flat, positive bowel sounds, no organomegally, no rebound, no guarding Ext:  2 plus pulses, no edema, no cyanosis, no clubbing Skin:  No rashes no nodules Neuro:  CN II through XII intact, motor grossly intact  Lab Results:  Basename 01/08/12 0612 01/06/12 1731 01/06/12 1702  WBC 5.2 -- 5.7  HGB 13.6 13.6 --  PLT 132* -- 152    Basename 01/08/12 0612 01/07/12 0311  NA 136 139  K 3.7 3.7  CL 100 101  CO2 26 25  GLUCOSE 191* 208*  BUN 33* 33*  CREATININE 1.62* 1.68*    Basename 01/07/12 1100 01/07/12 0311  TROPONINI 0.55* 1.04*   Hepatic Function Panel No results found for this basename: PROT,ALBUMIN,AST,ALT,ALKPHOS,BILITOT,BILIDIR,IBILI in the last 72 hours No results found for this basename: CHOL in the last 72 hours No results found for this basename: PROTIME in the last 72 hours  Imaging: Dg Chest Port 1 View  01/06/2012  *RADIOLOGY REPORT*  Clinical Data: Acute shortness of breath.  PORTABLE CHEST - 1 VIEW  Comparison: 06/29/2010  Findings: The cardiopericardial silhouette is enlarged. There is  pulmonary vascular congestion without overt pulmonary edema. Interval development of diffuse interstitial opacity suggesting edema.  Left AICD remains in place.  The patient is status post CABG.  IMPRESSION: Cardiomegaly with vascular congestion and diffuse interstitial pulmonary edema.   Original Report Authenticated By: ERIC A. MANSELL, M.D.     Cardiac Studies: Tele - NSR Assessment/Plan:  1. [place] 2. Chronic systolic CHF EF 20% 3. LBBB 4. Chronic renal insufficiency Rec: I will review heart cath with interventional colleagues. I do not think he is a good candidate for revascularization. I would consider discharge and followup in several weeks for BiV ICD upgrade.  LOS: 2 days    Taina Landry,M.D. 01/08/2012, 12:29 PM

## 2012-01-08 NOTE — Care Management Note (Signed)
    Page 1 of 1   01/08/2012     3:38:44 PM   CARE MANAGEMENT NOTE 01/08/2012  Patient:  [name] [name], [name] [name]   Account Number:  [id number]  Date Initiated:  01/08/2012  Documentation initiated by:  Tera Mater  Subjective/Objective Assessment:   77yo male admitted with CHF.  Pt. lives at home with spouse     Action/Plan:   Discharge planning   Anticipated DC Date:  01/10/2012   Anticipated DC Plan:  HOME W HOME HEALTH SERVICES      DC Planning Services  CM consult      [facility] Choice  HOME HEALTH   Choice offered to / List presented to:  C-1 Patient        HH arranged  HH-1 RN  HH-10 DISEASE MANAGEMENT      HH agency  [facility]   Status of service:  In process, will continue to follow Medicare Important Message given?   (If response is "NO", the following Medicare IM given date fields will be blank) Date Medicare IM given:   Date Additional Medicare IM given:    Discharge Disposition:    Per UR Regulation:  Reviewed for med. necessity/level of care/duration of stay  If discussed at Long Length of Stay Meetings, dates discussed:    Comments:  01/08/12 1500 Tera Mater, RN, BSN NCM [phone] In to complete HF Home Health Screen.  Pt. stated he was interested in having a HH RN for HF management.  After looking at list of [facility] agencies, pt. chose [facility].  TC to Hilda Lias, with [facility] to give referral for [facility] RN for HF management.  NCM to follow for further discharge needs.

## 2012-01-08 NOTE — Progress Notes (Signed)
ANTICOAGULATION CONSULT NOTE - Follow Up Consult  Pharmacy Consult for  Heparin Indication: NSTEMI  Allergies  Allergen Reactions  . Sulfa Antibiotics Swelling and Rash    Hands swell    Patient Measurements: Height: 5\' 8"  (172.7 cm) Weight: 247 lb 6.4 oz (112.22 kg) (scale c) IBW/kg (Calculated) : 68.4  Heparin Dosing Weight: 93.5 kg  Vital Signs: Temp: 97.9 F (36.6 C) (10/03 0955) Temp src: Oral (10/03 0955) BP: 121/64 mmHg (10/03 0955) Pulse Rate: 60  (10/03 0955)  Labs:  Basename 01/08/12 0612 01/07/12 1101 01/07/12 1100 01/07/12 0311 01/06/12 2130 01/06/12 1731 01/06/12 1702  HGB 13.6 -- -- -- -- 13.6 --  HCT 39.4 -- -- -- -- 40.0 39.5  PLT 132* -- -- -- -- -- 152  APTT -- -- -- -- -- -- 29  LABPROT -- -- -- -- -- -- 13.4  INR -- -- -- -- -- -- 1.03  HEPARINUNFRC 0.53 0.51 -- 0.25* -- -- --  CREATININE 1.62* -- -- 1.68* -- 1.60* --  CKTOTAL -- -- -- -- -- -- --  CKMB -- -- -- -- -- -- --  TROPONINI -- -- 0.55* 1.04* 1.04* -- --    Estimated Creatinine Clearance: 47.9 ml/min (by C-G formula based on Cr of 1.62).  Assessment:   Heparin level remains therapeutic on 1500 units/hr.   Initial platelet count 152, now down to132K. 154-173K in March 2012.  No bleeding noted.   Cath postponed due to renal function.  Creatinine 1.5 in May 2013.  Sprironolactone discontinued & Lisinopril dose decreased on 10/2. On Lasix 80 mg IV BID.    Goal of Therapy:  Heparin level 0.3-0.7 units/ml Monitor platelets by anticoagulation protocol: Yes   Plan:   Continue heparin drip at 1500 units/hr.  Next heparin level and CBC in am.  Will follow up plans.  Dennie Fetters, RPh Pager: [phone] 01/08/2012,11:51 AM

## 2012-01-08 NOTE — Progress Notes (Signed)
.  Clinical Social Work Department BRIEF PSYCHOSOCIAL ASSESSMENT 01/08/2012  Patient:  [name] [name], [name] [name]     Account Number:  [id number]     Admit date:  01/06/2012  Clinical Social Worker:  Juliette Mangle  Date/Time:  01/08/2012 12:53 PM  Referred by:  RN  Date Referred:  01/07/2012 Referred for  Advanced Directives   Other Referral:   Interview type:  Patient Other interview type:   Wife was at bedside    PSYCHOSOCIAL DATA Living Status:  FAMILY Admitted from facility:   Level of care:   Primary support name:  [name] [name] Primary support relationship to patient:  SPOUSE Degree of support available:   strong and vested- wife has been at patient's bedside since admission    CURRENT CONCERNS Current Concerns  None Noted   Other Concerns:    SOCIAL WORK ASSESSMENT / PLAN CSW received referral that patient is requesting AD paperwork. CSW met with patient and patient's wife at the bedside. CSW introduced self, explained role and discussed AD paperwork. CSW answered all pertinent questions and left paperwork with patient and patient's wife to reveiw. CSW provided contact information and patient will contact CSW if he decides to fill out the paperwork. CSW will sign off for now as social work intervention is not needed at this time.   Assessment/plan status:  No Further Intervention Required Other assessment/ plan:   Information/referral to community resources:   AD Packet    PATIENT'S/FAMILY'S RESPONSE TO PLAN OF CARE: Patient and patient's wife were very appreciative of information and resources provided by CSW. CSW will sign off for now. Please re-consult if new need arises.   [name] [name], MSW, [employer] [phone]

## 2012-01-09 ENCOUNTER — Encounter (HOSPITAL_COMMUNITY)
Admission: EM | Disposition: A | Discharge status: Home / Self Care | Payer: Self-pay | Source: Home / Self Care | Attending: Cardiology

## 2012-01-09 DIAGNOSIS — N183 Chronic kidney disease, stage 3 unspecified: Secondary | ICD-10-CM

## 2012-01-09 LAB — CBC
MCV: 90.9 fL (ref 78.0–100.0)
Platelets: 140 10*3/uL — ABNORMAL LOW (ref 150–400)
RDW: 14.2 % (ref 11.5–15.5)
WBC: 4.8 10*3/uL (ref 4.0–10.5)

## 2012-01-09 LAB — BASIC METABOLIC PANEL
CO2: 29 mEq/L (ref 19–32)
Calcium: 9.3 mg/dL (ref 8.4–10.5)
Creatinine, Ser: 1.79 mg/dL — ABNORMAL HIGH (ref 0.50–1.35)
GFR calc non Af Amer: 35 mL/min — ABNORMAL LOW (ref >90)
Sodium: 135 mEq/L (ref 135–145)

## 2012-01-09 LAB — GLUCOSE, CAPILLARY: Glucose-Capillary: 192 mg/dL — ABNORMAL HIGH (ref 70–99)

## 2012-01-09 LAB — HEPARIN LEVEL (UNFRACTIONATED): Heparin Unfractionated: 0.69 IU/mL (ref 0.30–0.70)

## 2012-01-09 SURGERY — LEFT HEART CATHETERIZATION WITH CORONARY/GRAFT ANGIOGRAM
Anesthesia: LOCAL

## 2012-01-09 MED ORDER — LISINOPRIL 20 MG PO TABS: 10.0000 mg | ORAL_TABLET | Freq: Every day | ORAL | Status: DC

## 2012-01-09 MED ORDER — TIOTROPIUM BROMIDE MONOHYDRATE 18 MCG IN CAPS: 18.0000 ug | ORAL_CAPSULE | Freq: Every day | RESPIRATORY_TRACT | Status: DC

## 2012-01-09 MED ORDER — SPIRONOLACTONE 12.5 MG HALF TABLET
12.5000 mg | ORAL_TABLET | Freq: Every day | ORAL | Status: DC
  Filled 2012-01-09: qty 1

## 2012-01-09 MED ORDER — TORSEMIDE 20 MG PO TABS: 60.0000 mg | ORAL_TABLET | Freq: Every day | ORAL | Status: DC

## 2012-01-09 MED ORDER — METFORMIN HCL 1000 MG PO TABS: 1000.0000 mg | ORAL_TABLET | Freq: Two times a day (BID) | ORAL | Status: DC

## 2012-01-09 NOTE — Progress Notes (Signed)
Discharge instructions given to patient and family, both stated they understood instructions. Discharged by volunteer services by wheelchair, Alert and oriented times 3, no distress noted.

## 2012-01-09 NOTE — Progress Notes (Signed)
Patient ID: [name] [name], male   DOB: 12-Nov-1934, 77 y.o.   MRN: [id number]    SUBJECTIVE: Feeling better this morning.  No dyspnea.  He has not had any anginal-type pain.  He diuresed well over the last 24 hours.     Marland Kitchen aspirin  81 mg Oral Daily  . atorvastatin  40 mg Oral q1800  . carvedilol  37.5 mg Oral BID WC  . clopidogrel  75 mg Oral QHS  . furosemide  80 mg Intravenous BID  . insulin aspart  0-15 Units Subcutaneous TID WC  . insulin detemir  20 Units Subcutaneous q morning - 10a  . lisinopril  10 mg Oral Daily  . nitroGLYCERIN  1 inch Topical Q6H  . sodium chloride  3 mL Intravenous Q12H  . tiotropium  18 mcg Inhalation Daily      Filed Vitals:   01/09/12 0500 01/09/12 0649 01/09/12 0720 01/09/12 0744  BP:  106/66 116/59   Pulse:  77 69   Temp:  98 F (36.7 C)    TempSrc:  Oral    Resp:  18    Height:      Weight: 248 lb 14.4 oz (112.9 kg) 248 lb 14.4 oz (112.9 kg)    SpO2:  97%  90%    Intake/Output Summary (Last 24 hours) at 01/09/12 0809 Last data filed at 01/09/12 0700  Gross per 24 hour  Intake   1083 ml  Output   2275 ml  Net  -1192 ml    LABS: Basic Metabolic Panel:  Basename 01/09/12 0525 01/08/12 0612  NA 135 136  K 3.6 3.7  CL 96 100  CO2 29 26  GLUCOSE 224* 191*  BUN 35* 33*  CREATININE 1.79* 1.62*  CALCIUM 9.3 8.9  MG -- --  PHOS -- --   Liver Function Tests: No results found for this basename: AST:2,ALT:2,ALKPHOS:2,BILITOT:2,PROT:2,ALBUMIN:2 in the last 72 hours No results found for this basename: LIPASE:2,AMYLASE:2 in the last 72 hours CBC:  Basename 01/09/12 0525 01/08/12 0612 01/06/12 1702  WBC 4.8 5.2 --  NEUTROABS -- -- 4.1  HGB 13.1 13.6 --  HCT 39.0 39.4 --  MCV 90.9 92.9 --  PLT 140* 132* --   Cardiac Enzymes:  Basename 01/07/12 1100 01/07/12 0311 01/06/12 2130  CKTOTAL -- -- --  CKMB -- -- --  CKMBINDEX -- -- --  TROPONINI 0.55* 1.04* 1.04*   RADIOLOGY: Dg Chest Port 1 View  01/06/2012  *RADIOLOGY REPORT*   Clinical Data: Acute shortness of breath.  PORTABLE CHEST - 1 VIEW  Comparison: 06/29/2010  Findings: The cardiopericardial silhouette is enlarged. There is pulmonary vascular congestion without overt pulmonary edema. Interval development of diffuse interstitial opacity suggesting edema.  Left AICD remains in place.  The patient is status post CABG.  IMPRESSION: Cardiomegaly with vascular congestion and diffuse interstitial pulmonary edema.   Original Report Authenticated By: ERIC A. MANSELL, M.D.     PHYSICAL EXAM General: NAD, obese. Neck: JVP 8 cm, no thyromegaly or thyroid nodule.  Lungs: Clear to auscultation bilaterally with normal respiratory effort. CV: Nondisplaced PMI.  Heart regular S1/S2, no S3/S4, no murmur.  1+ edema 1/2 to knees bilaterally.    Abdomen: Soft, nontender, no hepatosplenomegaly, no distention.  Neurologic: Alert and oriented x 3.  Psych: Normal affect. Extremities: No clubbing or cyanosis.   TELEMETRY: Reviewed telemetry pt in NSR  ASSESSMENT AND PLAN: 77 yo with history of CAD s/p CABG, ischemic cardiomyopathy, and COPD presented with acute on  chronic systolic CHF.   1. CHF: Acute on chronic systolic.  EF 20% on echo with LBBB.  He was admitted with symptoms consistent with CHF exacerbation, no chest pain.  He has diuresed reasonably well and is breathing better. His creatinine is rising now.  - Add back spironolactone 12.5 mg daily.  - Can change over to po diuretics: will try torsemide 60 mg daily rather than going back on po Lasix.  - Continue current Coreg and lisinopril.  - Plan for CRT upgrade in couple weeks.  2. CAD: Elevated troponin (mildly) in association with CKD and acute on chronic systolic CHF.  No chest pain.  I think that this was likely demand ischemia in setting of CHF rather than true ACS with plaque rupture.  I think that I would hold off on cardiac cath, especially as creatinine has risen again.  I discussed this with Dr. Excell Seltzer who had  reviewed the old films with Dr. Ladona Ridgel yesterday.  Continue ASA, Plavix, statin.  3. CKD: Mild rise in creatinine. Will avoid contrast load (no cath) and change diuretics to po.  4. Disposition: I think that he can go home today.  He will followup with Dr. Ladona Ridgel for CRT upgrade.  He will followup with me for CHF management.  I will need to see him next week (5-6 days) in the office (may overbook).  Will need a BMET at that time.  Cardiac meds: Torsemide 60 mg daily, spironolactone 12.5 mg daily, Coreg 37.5 mg bid, lisinopril 10 mg daily, ASA 81, Plavix 75, home statin regimen.    Marca Ancona 01/09/2012 8:17 AM

## 2012-01-09 NOTE — Progress Notes (Signed)
[facility]  Patient Status: New  [facility] is providing the following services: SN  If patient discharges after hours, please call [phone].   [name] [name] 01/09/2012, 9:30 AM

## 2012-01-09 NOTE — Discharge Summary (Signed)
Discharge Summary   Patient ID: [name] [name],  MRN: [id number], DOB/AGE: [birth year] 75 y.o.  Admit date: 01/06/2012 Discharge date: 01/09/2012  Primary Physician: Crawford Givens, MD Primary Cardiologist: Marca Ancona, MD; Lewayne Bunting, MD (EP)  Discharge Diagnoses Principal Problem:  *Acute on chronic systolic CHF (congestive heart failure) Active Problems:  DIABETES MELLITUS, TYPE II  HYPERLIPIDEMIA  HYPERTENSION  CAD, AUTOLOGOUS BYPASS GRAFT  CARDIOMYOPATHY, ISCHEMIC  COPD  AUTOMATIC IMPLANTABLE CARDIAC DEFIBRILLATOR SITU  NSTEMI (non-ST elevated myocardial infarction)  Acute on chronic kidney disease, stage 3   Allergies Allergies  Allergen Reactions  . Sulfa Antibiotics Swelling and Rash    Hands swell    Diagnostic Studies/Procedures  PORTABLE CHEST X-RAY - 01/06/12  Comparison: 06/29/2010  Findings: The cardiopericardial silhouette is enlarged. There is pulmonary vascular congestion without overt pulmonary edema. Interval development of diffuse interstitial opacity suggesting edema. Left AICD remains in place. The patient is status post CABG.  IMPRESSION:  Cardiomegaly with vascular congestion and diffuse interstitial pulmonary edema.  TRANSTHORACIC ECHOCARDIOGRAM - 01/07/12  - Left ventricle: The cavity size was severely dilated. Wall thickness was increased in a pattern of moderate LVH. Systolic function was severely reduced. The estimated ejection fraction was in the range of 20% to 25%. - Aortic valve: Mildly calcified annulus. - Mitral valve: Mild regurgitation. - Left atrium: The atrium was mildly dilated. - Right ventricle: The cavity size was mildly dilated. - Right atrium: The atrium was mildly dilated.  History of Present Illness  [name] [name] is a 77yo male with the above problem list who was admitted to [facility] on 01/06/12 for acute on chronic systolic CHF.   He has a history of CAD with prior CABG and resultant ischemic  cardiomyopathy (previous EF 25%). Last cardiac catheterization in 06/2010 revealed widely patent grafts. He does have a [employer] single-chamber ICD and recently diagnosed LBBB.   He reported progressive shortness of breath initially on exertion, then advancing at rest and when laying flat and acute shortness of breath in the middle of the night. He noted associated fatigue. He had a mild fall recently. There were no injuries and he denied frank syncope. He denied anginal-type chest pain, but did not some chest tightness recently associated with shortness of breath. No ICD firings. He called his PCP with these complaints the date of admission, and presented to the ED.  There, EKG revealed NSR and previously documented LBBB. Initial POC TnI returned mildly elevated at 0.26. pBNP returned elevated. CXR as above revealed cardiomegaly and changes consistent with acute CHF. He was started on heparin in the ED for NSTEMI. Given his lack of true anginal symptoms, mild elevation in trop-I and evidence of primarily dyspnea secondary to acute on chronic systolic CHF, he was admitted by cardiology initially for diuresis. Mild troponin elevation was suspected to be secondary to demand ischemia, however cardiac biomarkers were cycled. There was a low-threshold for cardiac catheterization should they become markedly elevated or the patient begin to complain of significant/worsening chest pain.   Hospital Course   He was started on Lasix IV with good output.  Cardiac biomarkers were trended and returned mildly increased from the initial result. He remained heparinized and a 2D echo was performed as noted above. This revealed LVEF 20-25%, moderate LVH, severe LV dilatation, mild biatrial enlargement, mildly dilated RV, mild MR and aortic calcification. His dyspnea improved, and he denied chest pain. He baseline elevated Cr increased further indicating a mild acute on chronic kidney injury in  the setting of diuresis. He was  formally diagnosed with a NSTEMI, type 2 in the setting of demand ischemia from decompensated heart failure. There was low suspicion of ACS due to plaque rupture. His prior cath films were reviewed by Dr. Shirlee Latch, Dr. Excell Seltzer and Dr. Ladona Ridgel. Diagnostic cardiac catheterization was deferred. Additionally, risk of further kidney injury from nephrotoxic contrast was felt to outweigh the benefit of angiography.   He was assessed by Dr. Shirlee Latch this morning who found him stable for discharge. He has diuresed well (net I/O: - 3503.3 cc; admission weight: 250 lbs; discharge weight 248 lbs). He will be transitioned to Torsemide PO over Lasix on discharge. Cardiac medications were adjusted and discharge medications are reflected below. These include ASA/Plavix/ACEi/BB/diuretic/spironolactone and statin. He will follow-up with Dr. Shirlee Latch in </= 7 days given his CHF diagnosis. BMET will be checked at that time to reassess renal function and electrolytes. He will see Dr. Ladona Ridgel as scheduled below for CRT-D upgrade previously discussed as an outpatient and re-affirmed this admission. His CBGs did indicate hyperglycemia. Metformin had been held in anticipation for possible cath and likely contributed to this. This will be resumed soon after discharge to allow renal function to recover. Spiriva has been refilled per the patient's request. He was advised to follow-up with his PCP for further diabetes management/glycemic control. This information, including supplemental heart failure material, has been clearly outlined in the discharge AVS.   Discharge Vitals:  Blood pressure 116/59, pulse 69, temperature 98 F (36.7 C), temperature source Oral, resp. rate 18, height 5\' 8"  (1.727 m), weight 112.9 kg (248 lb 14.4 oz), SpO2 90.00%.   Labs: Recent Labs  [facility] 01/09/12 0525 01/08/12 0612   WBC 4.8 5.2   HGB 13.1 13.6   HCT 39.0 39.4   MCV 90.9 92.9   PLT 140* 132*   Lab 01/09/12 0525 01/08/12 0612 01/07/12 0311  NA 135  136 139  K 3.6 3.7 3.7  CL 96 100 101  CO2 29 26 25   BUN 35* 33* 33*  CREATININE 1.79* 1.62* 1.68*  CALCIUM 9.3 8.9 9.1  PROT -- -- --  BILITOT -- -- --  ALKPHOS -- -- --  ALT -- -- --  AST -- -- --  AMYLASE -- -- --  LIPASE -- -- --  GLUCOSE 224* 191* 208*   Recent Labs  Basename 01/07/12 1100 01/07/12 0311 01/06/12 2130   CKTOTAL -- -- --   CKMB -- -- --   CKMBINDEX -- -- --   TROPONINI 0.55* 1.04* 1.04*   Disposition:  Discharge Orders    Future Appointments: Provider: Department: Dept Phone: Center:   01/15/2012 8:30 AM Laurey Morale, MD [facility] [place] [phone] [facility]   01/20/2012 3:15 PM Marinus Maw, MD [facility] [phone] [facility]   01/21/2012 9:20 AM [facility] [place] [phone] [facility]   01/26/2012 10:45 AM Joaquim Nam, MD [facility] [phone] [facility]     Follow-up Information    Follow up with Marca Ancona, MD. On 01/15/2012. (At 8:30 AM for follow-up after this hospitalization. Bloodwork will be done at this time. )    Contact information:   [address] [place] [address] [place] [address] [phone]       Follow up with Lewayne Bunting, MD. On 01/20/2012. (At 3:15 PM for follow-up after this hospitalization to upgrade ICD device. )    Contact information:   [address] [place]  [address] [phone]       Schedule an appointment as soon as possible for a visit with Crawford Givens, MD. (In 1-2 weeks for further management of your blood sugar. )    Contact information:   [address] [place] [address] [phone]          Discharge Medications:    Medication List     As of 01/09/2012 10:02 AM    START taking these medications         * nitroGLYCERIN 0.4 MG SL tablet   Commonly known as: NITROSTAT   Place 1 tablet (0.4 mg total) under the tongue every 5 (five) minutes as needed for chest pain.       torsemide 20 MG tablet   Commonly known as: DEMADEX   Take 3 tablets (60 mg total) by mouth daily.     * Notice: This list has 1 medication(s) that are the same as other medications prescribed for you. Read the directions carefully, and ask your doctor or other care provider to review them with you.    CHANGE how you take these medications         lisinopril 20 MG tablet   Commonly known as: PRINIVIL,ZESTRIL   Take 0.5 tablets (10 mg total) by mouth daily.   What changed: dose      CONTINUE taking these medications         aspirin 81 MG tablet      carvedilol 25 MG tablet   Commonly known as: COREG      Cinnamon 500 MG capsule      clopidogrel 75 MG tablet   Commonly known as: PLAVIX      diazepam 10 MG tablet   Commonly known as: VALIUM      Flax Seeds Powd      insulin aspart 100 UNIT/ML injection   Commonly known as: novoLOG      insulin detemir 100 UNIT/ML injection   Commonly known as: LEVEMIR      LIDODERM 5 %   Generic drug: lidocaine      metFORMIN 1000 MG tablet   Commonly known as: GLUCOPHAGE   Take 1 tablet (1,000 mg total) by mouth 2 (two) times daily with a meal.   Start taking on: 01/11/2012      * nitroGLYCERIN 0.4 MG SL tablet   Commonly known as: NITROSTAT      rosuvastatin 20 MG tablet   Commonly known as: CRESTOR      spironolactone 25 MG tablet   Commonly known as: ALDACTONE      tiotropium 18 MCG inhalation capsule   Commonly known as: SPIRIVA   Place 1 capsule (18 mcg total) into inhaler and inhale daily.     * Notice: This list has 1 medication(s) that are the same as other medications prescribed for you. Read the directions carefully, and ask your doctor or other care provider to review them with you.    STOP taking these medications         furosemide 40 MG tablet   Commonly known as: LASIX          Where to get your medications    These are the prescriptions that you need to pick up. We sent them to a specific pharmacy, so  you will need to go there to get them.   [facility] - [place], [place] - [address]    [address] [place] [address]  Phone: [phone]        metFORMIN 1000 MG tablet   nitroGLYCERIN 0.4 MG SL tablet   tiotropium 18 MCG inhalation capsule   torsemide 20 MG tablet         Information on where to get these meds is not yet available. Ask your nurse or doctor.         lisinopril 20 MG tablet           Outstanding Labs/Studies: BMET on 10/10  Duration of Discharge Encounter: Greater than 30 minutes including physician time.  Signed, R. Hurman Horn, PA-C 01/09/2012, 10:02 AM

## 2012-01-13 ENCOUNTER — Encounter: Payer: Self-pay | Admitting: *Deleted

## 2012-01-15 ENCOUNTER — Ambulatory Visit (INDEPENDENT_AMBULATORY_CARE_PROVIDER_SITE_OTHER): Payer: Medicare Other | Admitting: Cardiology

## 2012-01-15 ENCOUNTER — Telehealth: Payer: Self-pay | Admitting: Cardiology

## 2012-01-15 ENCOUNTER — Other Ambulatory Visit: Payer: Self-pay | Admitting: *Deleted

## 2012-01-15 ENCOUNTER — Encounter: Payer: Self-pay | Admitting: Cardiology

## 2012-01-15 VITALS — BP 112/62 | HR 70 | Ht 68.0 in | Wt 248.0 lb

## 2012-01-15 DIAGNOSIS — N189 Chronic kidney disease, unspecified: Secondary | ICD-10-CM

## 2012-01-15 DIAGNOSIS — I5022 Chronic systolic (congestive) heart failure: Secondary | ICD-10-CM

## 2012-01-15 DIAGNOSIS — R0602 Shortness of breath: Secondary | ICD-10-CM

## 2012-01-15 DIAGNOSIS — E785 Hyperlipidemia, unspecified: Secondary | ICD-10-CM

## 2012-01-15 DIAGNOSIS — I25810 Atherosclerosis of coronary artery bypass graft(s) without angina pectoris: Secondary | ICD-10-CM

## 2012-01-15 LAB — BASIC METABOLIC PANEL
Calcium: 9.1 mg/dL (ref 8.4–10.5)
GFR: 32.52 mL/min — ABNORMAL LOW (ref >60.00)
Glucose, Bld: 225 mg/dL — ABNORMAL HIGH (ref 70–99)
Sodium: 134 mEq/L — ABNORMAL LOW (ref 135–145)

## 2012-01-15 LAB — LIPID PANEL: HDL: 45.5 mg/dL (ref >39.00)

## 2012-01-15 MED ORDER — TRAMADOL HCL 50 MG PO TABS: ORAL_TABLET | ORAL | Status: DC

## 2012-01-15 MED ORDER — LISINOPRIL 10 MG PO TABS: ORAL_TABLET | ORAL | Status: DC

## 2012-01-15 NOTE — Telephone Encounter (Signed)
Spoke with pt's wife about medications. Per Dr Shirlee Latch -pt does not have to continue Plavix ,  lisinopril dose will be 10 mg daily. Pt's wife aware.

## 2012-01-15 NOTE — Patient Instructions (Addendum)
Increase lisinopril to 15mg  daily. This will be one and one-half 10mg  tablets daily at the same time.  Use tramadol 50mg  two times a day as needed for knee pain. Dr Shirlee Latch will not be able to provide refills for this. If you find you  need a refill on this you should discuss this with your primary care doctor or other provider. Use this instead of NSAIDS -medications like ipuprofen/aleve/advil.    Your physician recommends that you have a FASTING lipid profile /liver profile /BMET today.   Your physician recommends that you have lab work on Tuesday October 15 when you see Dr Danae Orleans.   Keep the appointment you already have scheduled with Dr Ladona Ridgel next Tuesday October 15,2013.  Your physician has recommended that you have a sleep study. This test records several body functions during sleep, including: brain activity, eye movement, oxygen and carbon dioxide blood levels, heart rate and rhythm, breathing rate and rhythm, the flow of air through your mouth and nose, snoring, body muscle movements, and chest and belly movement.  Your physician recommends that you schedule a follow-up appointment in: 1 month with Dr Shirlee Latch.

## 2012-01-15 NOTE — Telephone Encounter (Signed)
Pt is on 20mg  of lisinopril not 10 should he still increase it and he has not been taking plavix for at least a year but they gave him sum in the hospital. Does he need to start to take it again?

## 2012-01-16 NOTE — Progress Notes (Signed)
Patient ID: [name] [name], male   DOB: 1934/12/06, 77 y.o.   MRN: [id number] PCP: Dr. Para [name]  77 yo with history of CAD s/p CABG, ischemic cardiomyopathy/systolic CHF, LBBB, COPD, and CKD presents for cardiology followup.  He had CABG in 2008.  Last cath in 3/12 showed patent grafts.  EF is 20-25%.  Over the last year, he has noted decreased [name] and more fatigue.  He has also become more and more short of breath with exertion over the last few months, culminating in an admission earlier this month with acute on chronic systolic CHF.  He was volume overloaded.  He had a mild TnI elevation with no chest pain.  He was diuresed and discharged.  Since discharge, he has been breathing better.  He does not now notice significant exertional dyspnea but is limited by bilateral knee arthritis.  He is not very active.  No orthopnea or PND.   No chest pain.  Weight is down 8 lbs compared to prior appointment in this office.  Also of note, patient snores loudly and sometimes gasps in his sleep.   ECG: NSR, 1st degree AV block PR 234 msec, LBBB with QRS 180 msec  Labs (10/13): K 3.6, creatinine 1.79  PMH: 1. CAD: S/p CABG 2008.  Cath 3/12 with totally occluded mLAD and RCA, 80% LCx stenosis, patent LIMA-LAD, patent SVG-OM, 40% stenosis in SVG-PDA.   2. Ischemic cardiomyopathy: Echo (10/13) with severely dilated LV, moderate LVH, EF 20-25%, mild MR.  He has an ICD.  3. LBBB 4. COPD 5. Type II diabetes 6. Hyperlipidemia 7. HTN 8. CKD 9. Obesity 10. OA: knees. 11. 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   FH: Father with MI at 50  ROS: All systems reviewed and negative except as per HPI.   Current Outpatient Prescriptions  Medication Sig Dispense Refill  . aspirin 81 MG tablet Take 81 mg by mouth 2 (two) times daily.        . carvedilol (COREG) 25 MG tablet Take 37.5 mg by mouth 2 (two) times daily with a meal.      . Cinnamon 500 MG  capsule Take 500 mg by mouth 2 (two) times daily. Take 2 tablets daily by mouth      . diazepam (VALIUM) 10 MG tablet Take 5 mg by mouth daily as needed. For anxiety & ringing in ears      . Flaxseed, Linseed, (FLAX SEEDS) POWD Take 5 mLs by mouth daily.      . insulin aspart (NOVOLOG) 100 UNIT/ML injection Inject 10-16 Units into the skin 3 (three) times daily before meals. Scale: sugar 100-150, 10 units. 150-200, 12 units. 200-250, 14 units. 250+, 16 units.      . insulin detemir (LEVEMIR) 100 UNIT/ML injection Inject 20 Units into the skin every morning.       Marland Kitchen LIDODERM 5 % Place 1 patch onto the skin daily as needed. For pain      . nitroGLYCERIN (NITROSTAT) 0.4 MG SL tablet Place 1 tablet (0.4 mg total) under the tongue every 5 (five) minutes as needed for chest pain.  50 tablet  3  . nitroGLYCERIN (NITROSTAT) 0.4 MG SL tablet Place 0.4 mg under the tongue every 5 (five) minutes as needed. For chest pain      . rosuvastatin (CRESTOR) 20 MG tablet Take 20 mg by mouth at bedtime.        Marland Kitchen spironolactone (ALDACTONE) 25 MG  tablet Take 12.5 mg by mouth at bedtime.       Marland Kitchen tiotropium (SPIRIVA HANDIHALER) 18 MCG inhalation capsule Place 1 capsule (18 mcg total) into inhaler and inhale daily.  30 capsule  3  . torsemide (DEMADEX) 20 MG tablet Take 3 tablets (60 mg total) by mouth daily.  90 tablet  3  . lisinopril (PRINIVIL,ZESTRIL) 10 MG tablet Take 1 tablet (10 mg total) by mouth daily.      . traMADol (ULTRAM) 50 MG tablet Two times daily as needed for knee pain  30 tablet  0    BP 112/62  Pulse 70  Ht 5\' 8"  (1.727 m)  Wt 248 lb (112.492 kg)  BMI 37.71 kg/m2 General: NAD, obese Neck: No JVD, no thyromegaly or thyroid nodule.  Lungs: Clear to auscultation bilaterally with normal respiratory effort. CV: Nondisplaced PMI.  Heart regular S1/S2, no S3/S4, no murmur.  Trace ankle edema.  No carotid bruit.  Normal pedal pulses.  Abdomen: Soft, nontender, no hepatosplenomegaly, no distention.    Neurologic: Alert and oriented x 3.  Psych: Normal affect. Extremities: No clubbing or cyanosis.   Assessment/Plan: 1. Chronic systolic CHF: EF 16-10% by last echo.  Recently admitted with exacerbation and diuresed.  He is feeling better with NYHA class II-III symptoms.  He is not significant volume overloaded on exam.  - BMET/BNP today.  If creatinine and K stable, I will increase lisinopril from 10 to 15 mg daily. - Continue Coreg and spironolactone at current doses.   - Continue current torsemide dosing.  - [name] has a wide LBBB (180 msec).  He is a good candidate for CRT.  He will be seeing Dr. Ladona Ridgel soon to set this up.  2. CAD: s/p CABG.  No recent chest pain.  Last cath in 3/12 with patent grafts.  He had a mild elevation in troponin during recent admission with acute on chronic systolic CHF.  I think that this was most likely demand ischemia in the setting of significant volume overload.  We held off on cath given concern for demand ischemia and CKD.  - Continue ASA 81, statin, and Coreg.   3. Hyperlipidemia: Goal LDL < 70 with CAD.  I will check his lipids today.  I am not sure that he has been compliant with Crestor.   4. OSA: Symptoms concerning for OSA.  I will arrange a sleep study.   Followup in 1 month.   [name] [name]

## 2012-01-20 ENCOUNTER — Encounter: Payer: Self-pay | Admitting: Internal Medicine

## 2012-01-20 ENCOUNTER — Other Ambulatory Visit (INDEPENDENT_AMBULATORY_CARE_PROVIDER_SITE_OTHER): Payer: Medicare Other

## 2012-01-20 ENCOUNTER — Ambulatory Visit (INDEPENDENT_AMBULATORY_CARE_PROVIDER_SITE_OTHER): Payer: Medicare Other | Admitting: Internal Medicine

## 2012-01-20 ENCOUNTER — Telehealth: Payer: Self-pay

## 2012-01-20 ENCOUNTER — Telehealth: Payer: Self-pay | Admitting: Internal Medicine

## 2012-01-20 ENCOUNTER — Encounter: Payer: Self-pay | Admitting: *Deleted

## 2012-01-20 VITALS — BP 142/82 | HR 76 | Ht 68.0 in | Wt 250.0 lb

## 2012-01-20 DIAGNOSIS — I214 Non-ST elevation (NSTEMI) myocardial infarction: Secondary | ICD-10-CM

## 2012-01-20 DIAGNOSIS — I25810 Atherosclerosis of coronary artery bypass graft(s) without angina pectoris: Secondary | ICD-10-CM

## 2012-01-20 DIAGNOSIS — R0989 Other specified symptoms and signs involving the circulatory and respiratory systems: Secondary | ICD-10-CM

## 2012-01-20 DIAGNOSIS — I5023 Acute on chronic systolic (congestive) heart failure: Secondary | ICD-10-CM

## 2012-01-20 DIAGNOSIS — E785 Hyperlipidemia, unspecified: Secondary | ICD-10-CM

## 2012-01-20 DIAGNOSIS — N189 Chronic kidney disease, unspecified: Secondary | ICD-10-CM

## 2012-01-20 DIAGNOSIS — Z95810 Presence of automatic (implantable) cardiac defibrillator: Secondary | ICD-10-CM

## 2012-01-20 DIAGNOSIS — I509 Heart failure, unspecified: Secondary | ICD-10-CM

## 2012-01-20 DIAGNOSIS — I5022 Chronic systolic (congestive) heart failure: Secondary | ICD-10-CM

## 2012-01-20 LAB — ICD DEVICE OBSERVATION
BRDY-0002RV: 40 {beats}/min
DEV-0020ICD: NEGATIVE
RV LEAD THRESHOLD: 0.75 V
TZAT-0001FASTVT: 1
TZAT-0001SLOWVT: 1
TZAT-0001SLOWVT: 2
TZAT-0004FASTVT: 8
TZAT-0011SLOWVT: 10 ms
TZAT-0011SLOWVT: 10 ms
TZAT-0018SLOWVT: NEGATIVE
TZAT-0018SLOWVT: NEGATIVE
TZAT-0019SLOWVT: 8 V
TZAT-0019SLOWVT: 8 V
TZAT-0020FASTVT: 1.5 ms
TZAT-0020SLOWVT: 1.5 ms
TZAT-0020SLOWVT: 1.5 ms
TZON-0003VSLOWVT: 450 ms
TZST-0001FASTVT: 2
TZST-0001FASTVT: 4
TZST-0001FASTVT: 6
TZST-0001SLOWVT: 3
TZST-0001SLOWVT: 5
TZST-0003FASTVT: 25 J
TZST-0003FASTVT: 35 J
TZST-0003FASTVT: 35 J
TZST-0003SLOWVT: 15 J
TZST-0003SLOWVT: 35 J

## 2012-01-20 NOTE — Assessment & Plan Note (Signed)
His single-chamber device is working normally. We'll plan to recheck after upgrade in several months.

## 2012-01-20 NOTE — Patient Instructions (Signed)
See instruction sheet

## 2012-01-20 NOTE — Telephone Encounter (Signed)
[name] [name] with [facility] wanted Dr Para March to know that pt stopped metformin; was told by friend that it was poison and pt is afraid to take med now. [name] [name] tried to teach pt about metformin.Please advise.

## 2012-01-20 NOTE — Telephone Encounter (Signed)
plz return call to [name] [name]- [facility]  [phone]  Pt is not taking medication as directed, due to mediation fears.  plz return call to [name] [name] to discuss

## 2012-01-20 NOTE — Assessment & Plan Note (Signed)
I discussed the treatment options with the patient and his wife. They would like to proceed with upgrade from his single-chamber defibrillator to a biventricular ICD. The risk, goals, benefits, and expectations of the procedure have been discussed with the patient and he wishes to proceed.

## 2012-01-20 NOTE — Progress Notes (Signed)
HPI [name] [name] returns today for followup. He is a very pleasant 77-year-old man with an ischemic cardiomyopathy and chronic systolic heart failure. He has left bundle branch block. The patient was hospitalized several weeks ago with worsening congestive heart failure, and had a borderline elevated troponin. We initially discussed catheterization but ultimately it was deemed not necessary and he has done well after he was treated with diuretic therapy. The patient is also limited by arthritis in his knees. He is a QRS duration of 180 ms, left bundle branch block, and first-degree AV block. Several months ago, I discussed the possibility of upgrading to a biventricular ICD but he was against this. He now states that he would like to have cardiac resynchronization carried out as he hopes to have more energy and less dyspnea. His creatinine runs in the high ones. Allergies  Allergen Reactions  . Sulfa Antibiotics Swelling and Rash    Hands swell     Current Outpatient Prescriptions  Medication Sig Dispense Refill  . aspirin 81 MG tablet Take 81 mg by mouth 2 (two) times daily.        . carvedilol (COREG) 25 MG tablet Take 37.5 mg by mouth 2 (two) times daily with a meal.      . Cinnamon 500 MG capsule Take 500 mg by mouth 2 (two) times daily. Take 2 tablets daily by mouth      . diazepam (VALIUM) 10 MG tablet Take 5 mg by mouth daily as needed. For anxiety & ringing in ears      . Flaxseed, Linseed, (FLAX SEEDS) POWD Take 5 mLs by mouth daily.      . insulin aspart (NOVOLOG) 100 UNIT/ML injection Inject 10-16 Units into the skin 3 (three) times daily before meals. Scale: sugar 100-150, 10 units. 150-200, 12 units. 200-250, 14 units. 250+, 16 units.      . insulin detemir (LEVEMIR) 100 UNIT/ML injection Inject 20 Units into the skin every morning.       . LIDODERM 5 % Place 1 patch onto the skin daily as needed. For pain      . lisinopril (PRINIVIL,ZESTRIL) 10 MG tablet Take 1 tablet (10 mg total) by  mouth daily.      . nitroGLYCERIN (NITROSTAT) 0.4 MG SL tablet Place 1 tablet (0.4 mg total) under the tongue every 5 (five) minutes as needed for chest pain.  50 tablet  3  . nitroGLYCERIN (NITROSTAT) 0.4 MG SL tablet Place 0.4 mg under the tongue every 5 (five) minutes as needed. For chest pain      . rosuvastatin (CRESTOR) 20 MG tablet Take 20 mg by mouth at bedtime.        . spironolactone (ALDACTONE) 25 MG tablet Take 12.5 mg by mouth at bedtime.       . tiotropium (SPIRIVA HANDIHALER) 18 MCG inhalation capsule Place 1 capsule (18 mcg total) into inhaler and inhale daily.  30 capsule  3  . torsemide (DEMADEX) 20 MG tablet Take 3 tablets (60 mg total) by mouth daily.  90 tablet  3  . traMADol (ULTRAM) 50 MG tablet Two times daily as needed for knee pain  30 tablet  0     Past Medical History  Diagnosis Date  . COPD (chronic obstructive pulmonary disease) 1993  . Hyperlipidemia 1993  . Hypertension 1993  . CAD (coronary artery disease)     bypass graft surgery 05/2006; grafts were widely patent on cath 06/2010   . Ischemic cardiomyopathy       EF 25-30%  . Systolic heart failure     class II  . S/P ICD (internal cardiac defibrillator) procedure 2010    single  . Foot fracture 1953; 1962    left; left  . Chronic kidney disease   . ICD (implantable cardiac defibrillator) in place 2010  . CHF (congestive heart failure)     hospital, med nonadherence 2/5-05/16/09  . Myocardial infarction 2008; 01/06/2012  . Syncope and collapse 01/05/2012  . Shortness of breath 01/05/2012    "all the time; been going on for awhile"  . Diabetes mellitus, type 2 2000  . Arthritis     "hands" (01/06/2012)  . Elbow mass     left; "just noticed it ~ 2 wk ago"  (01/06/2012)  . Anxiety   . Nightmares     with psych eval 2012 at [facility]  . PTSD (post-traumatic stress disorder)   . Tinnitus of left ear   . Deafness in left ear     ROS:   All systems reviewed and negative except as noted in the HPI.   Past  Surgical History  Procedure Date  . Coronary artery bypass graft 2008    3 vessel, class 4 CHF, non Q wave MI 05/19/06  . Appendectomy 1957  . Cardiac defibrillator placement 2010  . Fracture surgery 1953; ~ 1962    left; left     Family History  Problem Relation Age of Onset  . Heart disease Father     MI age 58  . Diabetes Sister   . Hyperlipidemia Sister   . Cancer Maternal Grandmother     liver, cirrhosis  . Depression Neg Hx   . Alcohol abuse Neg Hx   . Drug abuse Neg Hx   . Stroke Neg Hx      History   Social History  . Marital Status: Married    Spouse Name: N/A    Number of Children: 0  . Years of Education: N/A   Occupational History  . retired     [employer]- sold   Social History Main Topics  . Smoking status: Former Smoker -- 0.5 packs/day for 35 years    Types: Cigarettes  . Smokeless tobacco: Never Used   Comment: 01/06/2012 "quit smoking in 2008; slip and have one ocasionally still"  . Alcohol Use: 44.4 oz/week    32 Glasses of wine, 42 Cans of beer per week     01/06/2012 "big bottle of wine at least 3 nights/wk; 6 pack pretty much q night"  . Drug Use: No  . Sexually Active: No   Other Topics Concern  . 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 HEART:  Regular rate rhythm, no murmurs, no rubs, no clicks, split S2. Abd:  soft, obese, positive bowel sounds, no organomegally, no rebound, no guarding Ext:  2 plus pulses, no edema, no cyanosis, no clubbing Skin:  No rashes no nodules Neuro:   CN II through XII intact, motor grossly intact   DEVICE  Normal device function.    See PaceArt for details.   Assess/Plan:  

## 2012-01-20 NOTE — Telephone Encounter (Signed)
Spoke with [name] [name]. She states pt not taking Plavix regularly. Per Dr Shirlee Latch 01/15/12 --pt does not have to continue Plavix. Pt's was made aware of this 01/15/12. [name] [name] is aware pt does not have to continue Plavix.

## 2012-01-20 NOTE — Assessment & Plan Note (Signed)
The patient denies any anginal symptoms at this time. He has had long-standing left ventricular dysfunction and left bundle branch block. His recent NSTEMI had no effect on his left ventricular function.

## 2012-01-20 NOTE — Assessment & Plan Note (Signed)
His symptoms are class 2-3. He'll continue his current medical therapy and plan for upgrade to a biventricular ICD.

## 2012-01-21 ENCOUNTER — Other Ambulatory Visit (INDEPENDENT_AMBULATORY_CARE_PROVIDER_SITE_OTHER): Payer: Medicare Other

## 2012-01-21 DIAGNOSIS — E119 Type 2 diabetes mellitus without complications: Secondary | ICD-10-CM

## 2012-01-21 LAB — CBC WITH DIFFERENTIAL/PLATELET
Basophils Absolute: 0 10*3/uL (ref 0.0–0.1)
Eosinophils Relative: 1.8 % (ref 0.0–5.0)
HCT: 42.8 % (ref 39.0–52.0)
Lymphs Abs: 1.5 10*3/uL (ref 0.7–4.0)
MCV: 92.3 fl (ref 78.0–100.0)
Monocytes Absolute: 0.5 10*3/uL (ref 0.1–1.0)
Monocytes Relative: 6.7 % (ref 3.0–12.0)
Neutrophils Relative %: 71.4 % (ref 43.0–77.0)
Platelets: 185 10*3/uL (ref 150.0–400.0)
RDW: 13.9 % (ref 11.5–14.6)
WBC: 7.4 10*3/uL (ref 4.5–10.5)

## 2012-01-21 LAB — BASIC METABOLIC PANEL
Calcium: 9.2 mg/dL (ref 8.4–10.5)
Creatinine, Ser: 2 mg/dL — ABNORMAL HIGH (ref 0.4–1.5)
GFR: 35.39 mL/min — ABNORMAL LOW (ref >60.00)

## 2012-01-21 LAB — HEMOGLOBIN A1C: Hgb A1c MFr Bld: 9.1 % — ABNORMAL HIGH (ref 4.6–6.5)

## 2012-01-21 NOTE — Telephone Encounter (Signed)
Left message on voice mail  to call back

## 2012-01-21 NOTE — Telephone Encounter (Signed)
He should stop the metformin, not because of his friend's advice but because his kidney function has worsened.

## 2012-01-22 ENCOUNTER — Encounter (HOSPITAL_COMMUNITY): Payer: Self-pay | Admitting: Pharmacy Technician

## 2012-01-22 NOTE — Telephone Encounter (Signed)
Patient advised.

## 2012-01-23 ENCOUNTER — Other Ambulatory Visit: Payer: Self-pay | Admitting: *Deleted

## 2012-01-23 DIAGNOSIS — I509 Heart failure, unspecified: Secondary | ICD-10-CM

## 2012-01-26 ENCOUNTER — Encounter: Payer: Self-pay | Admitting: Family Medicine

## 2012-01-26 ENCOUNTER — Ambulatory Visit (INDEPENDENT_AMBULATORY_CARE_PROVIDER_SITE_OTHER): Payer: Medicare Other | Admitting: Family Medicine

## 2012-01-26 VITALS — BP 140/72 | HR 77 | Temp 97.8°F | Wt 248.8 lb

## 2012-01-26 DIAGNOSIS — E119 Type 2 diabetes mellitus without complications: Secondary | ICD-10-CM

## 2012-01-26 DIAGNOSIS — I509 Heart failure, unspecified: Secondary | ICD-10-CM

## 2012-01-26 DIAGNOSIS — N189 Chronic kidney disease, unspecified: Secondary | ICD-10-CM

## 2012-01-26 DIAGNOSIS — N179 Acute kidney failure, unspecified: Secondary | ICD-10-CM

## 2012-01-26 DIAGNOSIS — I5023 Acute on chronic systolic (congestive) heart failure: Secondary | ICD-10-CM

## 2012-01-26 MED ORDER — INSULIN DETEMIR 100 UNIT/ML ~~LOC~~ SOLN: 20.0000 [IU] | Freq: Every morning | SUBCUTANEOUS | Status: DC

## 2012-01-26 NOTE — Patient Instructions (Addendum)
Add 1 unit of levemir every day until AM sugar is less than 150.  Recheck A1c in 3 months.  Come see me after that.  30 min visit.  See Shirlee Limerick about your referral before you leave today.

## 2012-01-26 NOTE — Progress Notes (Signed)
Diabetes:  Quit metformin due to friend's rec.  He did need to stop it due to renal dysfxn; explained that renal disease was likely due to his h/o vasc disease, h/o etoh use, smoking and DM2.  Hypoglycemic episodes: no Hyperglycemic episodes: Sugar >200 this AM.   Blood Sugars averaging: diffusely elevated I've talked with patient prev about med adherence with suboptimal response.    Elevated Cr.  Noted on prev labs.  D/w pt.  His ACE was decreased.  See plan.  H/o CAD, recently admitted for CHF.  Prev cards notes reviewed.   PMH and SH reviewed  Meds, vitals, and allergies reviewed.   ROS: See HPI.  Otherwise negative.    GEN: nad, alert and oriented HEENT: mucous membranes moist NECK: supple w/o LA CV: rrr. PULM: ctab, no inc wob ABD: soft, +bs EXT: no edema SKIN: no acute rash

## 2012-01-27 ENCOUNTER — Encounter: Payer: Self-pay | Admitting: Family Medicine

## 2012-01-27 NOTE — Assessment & Plan Note (Signed)
Refer to renal.   Will attempt risk factor reduction.  See plan re: Dm2.

## 2012-01-27 NOTE — Assessment & Plan Note (Signed)
Inc dose of levemir by 1 unit per day until AM sugar is <150.  I suspect that meantime adherence has been suspect prev.  D/w pt about importance of control of DM2.

## 2012-01-27 NOTE — Assessment & Plan Note (Signed)
Appears euvolemic today, defer to cards.

## 2012-01-29 MED ORDER — SODIUM CHLORIDE 0.9 % IJ SOLN: 3.0000 mL | Freq: Two times a day (BID) | INTRAMUSCULAR | Status: DC

## 2012-01-29 MED ORDER — SODIUM CHLORIDE 0.45 % IV SOLN: INTRAVENOUS | Status: DC

## 2012-01-29 MED ORDER — SODIUM CHLORIDE 0.9 % IJ SOLN: 3.0000 mL | INTRAMUSCULAR | Status: DC | PRN

## 2012-01-29 MED ORDER — CEFAZOLIN SODIUM-DEXTROSE 2-3 GM-% IV SOLR
2.0000 g | INTRAVENOUS | Status: DC
  Filled 2012-01-29 (×2): qty 50

## 2012-01-29 MED ORDER — SODIUM CHLORIDE 0.9 % IV SOLN: 250.0000 mL | INTRAVENOUS | Status: DC

## 2012-01-29 MED ORDER — CHLORHEXIDINE GLUCONATE 4 % EX LIQD
60.0000 mL | Freq: Once | CUTANEOUS | Status: DC
  Filled 2012-01-29: qty 60

## 2012-01-29 MED ORDER — GENTAMICIN SULFATE 40 MG/ML IJ SOLN
80.0000 mg | INTRAMUSCULAR | Status: DC
  Filled 2012-01-29: qty 2

## 2012-01-30 ENCOUNTER — Encounter (HOSPITAL_COMMUNITY): Payer: Self-pay | Admitting: General Practice

## 2012-01-30 ENCOUNTER — Encounter (HOSPITAL_COMMUNITY)
Admission: RE | Disposition: A | Discharge status: Home / Self Care | Payer: Self-pay | Source: Ambulatory Visit | Attending: Internal Medicine

## 2012-01-30 ENCOUNTER — Ambulatory Visit (HOSPITAL_COMMUNITY)
Admission: RE | Admit: 2012-01-30 | Discharge: 2012-01-31 | Disposition: A | Discharge status: Home / Self Care | Payer: Medicare Other | Source: Ambulatory Visit | Attending: Internal Medicine | Admitting: Internal Medicine

## 2012-01-30 DIAGNOSIS — N189 Chronic kidney disease, unspecified: Secondary | ICD-10-CM | POA: Insufficient documentation

## 2012-01-30 DIAGNOSIS — I2589 Other forms of chronic ischemic heart disease: Secondary | ICD-10-CM | POA: Insufficient documentation

## 2012-01-30 DIAGNOSIS — I509 Heart failure, unspecified: Secondary | ICD-10-CM | POA: Insufficient documentation

## 2012-01-30 DIAGNOSIS — J449 Chronic obstructive pulmonary disease, unspecified: Secondary | ICD-10-CM | POA: Insufficient documentation

## 2012-01-30 DIAGNOSIS — J4489 Other specified chronic obstructive pulmonary disease: Secondary | ICD-10-CM | POA: Insufficient documentation

## 2012-01-30 DIAGNOSIS — I129 Hypertensive chronic kidney disease with stage 1 through stage 4 chronic kidney disease, or unspecified chronic kidney disease: Secondary | ICD-10-CM | POA: Insufficient documentation

## 2012-01-30 DIAGNOSIS — E119 Type 2 diabetes mellitus without complications: Secondary | ICD-10-CM | POA: Insufficient documentation

## 2012-01-30 DIAGNOSIS — Z95810 Presence of automatic (implantable) cardiac defibrillator: Secondary | ICD-10-CM

## 2012-01-30 DIAGNOSIS — I447 Left bundle-branch block, unspecified: Secondary | ICD-10-CM | POA: Insufficient documentation

## 2012-01-30 DIAGNOSIS — I5022 Chronic systolic (congestive) heart failure: Secondary | ICD-10-CM | POA: Insufficient documentation

## 2012-01-30 HISTORY — DX: Furuncle, unspecified: L02.92

## 2012-01-30 HISTORY — PX: CARDIAC DEFIBRILLATOR PLACEMENT: SHX171

## 2012-01-30 HISTORY — DX: Left bundle-branch block, unspecified: I44.7

## 2012-01-30 HISTORY — PX: INSERT / REPLACE / REMOVE PACEMAKER: SUR710

## 2012-01-30 HISTORY — PX: BI-VENTRICULAR IMPLANTABLE CARDIOVERTER DEFIBRILLATOR UPGRADE: SHX5461

## 2012-01-30 LAB — GLUCOSE, CAPILLARY
Glucose-Capillary: 168 mg/dL — ABNORMAL HIGH (ref 70–99)
Glucose-Capillary: 216 mg/dL — ABNORMAL HIGH (ref 70–99)

## 2012-01-30 SURGERY — BI-VENTRICULAR IMPLANTABLE CARDIOVERTER DEFIBRILLATOR UPGRADE
Anesthesia: LOCAL

## 2012-01-30 MED ORDER — MIDAZOLAM HCL 5 MG/5ML IJ SOLN
INTRAMUSCULAR | Status: AC
  Filled 2012-01-30: qty 5

## 2012-01-30 MED ORDER — INSULIN ASPART 100 UNIT/ML ~~LOC~~ SOLN
0.0000 [IU] | Freq: Three times a day (TID) | SUBCUTANEOUS | Status: DC
  Administered 2012-01-30: 3 [IU] via SUBCUTANEOUS
  Administered 2012-01-31: 5 [IU] via SUBCUTANEOUS

## 2012-01-30 MED ORDER — CEFAZOLIN SODIUM-DEXTROSE 2-3 GM-% IV SOLR
2.0000 g | Freq: Four times a day (QID) | INTRAVENOUS | Status: AC
  Administered 2012-01-30 – 2012-01-31 (×3): 2 g via INTRAVENOUS
  Filled 2012-01-30 (×3): qty 50

## 2012-01-30 MED ORDER — INSULIN DETEMIR 100 UNIT/ML ~~LOC~~ SOLN
20.0000 [IU] | Freq: Every morning | SUBCUTANEOUS | Status: DC
  Administered 2012-01-31: 20 [IU] via SUBCUTANEOUS
  Filled 2012-01-30: qty 10

## 2012-01-30 MED ORDER — CARVEDILOL 25 MG PO TABS
37.5000 mg | ORAL_TABLET | Freq: Two times a day (BID) | ORAL | Status: DC
  Administered 2012-01-30 – 2012-01-31 (×2): 37.5 mg via ORAL
  Filled 2012-01-30 (×4): qty 1

## 2012-01-30 MED ORDER — CLOPIDOGREL BISULFATE 75 MG PO TABS
75.0000 mg | ORAL_TABLET | Freq: Every day | ORAL | Status: DC
  Administered 2012-01-30 – 2012-01-31 (×2): 75 mg via ORAL
  Filled 2012-01-30 (×2): qty 1

## 2012-01-30 MED ORDER — FENTANYL CITRATE 0.05 MG/ML IJ SOLN
INTRAMUSCULAR | Status: AC
  Filled 2012-01-30: qty 2

## 2012-01-30 MED ORDER — SPIRONOLACTONE 12.5 MG HALF TABLET
12.5000 mg | ORAL_TABLET | Freq: Every day | ORAL | Status: DC
  Administered 2012-01-30: 12.5 mg via ORAL
  Filled 2012-01-30 (×2): qty 1

## 2012-01-30 MED ORDER — LISINOPRIL 5 MG PO TABS
5.0000 mg | ORAL_TABLET | Freq: Every day | ORAL | Status: DC
  Administered 2012-01-30 – 2012-01-31 (×2): 5 mg via ORAL
  Filled 2012-01-30 (×2): qty 1

## 2012-01-30 MED ORDER — TIOTROPIUM BROMIDE MONOHYDRATE 18 MCG IN CAPS
18.0000 ug | ORAL_CAPSULE | Freq: Every day | RESPIRATORY_TRACT | Status: DC
  Administered 2012-01-31: 18 ug via RESPIRATORY_TRACT
  Filled 2012-01-30: qty 5

## 2012-01-30 MED ORDER — ONDANSETRON HCL 4 MG/2ML IJ SOLN: 4.0000 mg | Freq: Four times a day (QID) | INTRAMUSCULAR | Status: DC | PRN

## 2012-01-30 MED ORDER — TORSEMIDE 20 MG PO TABS
60.0000 mg | ORAL_TABLET | Freq: Every day | ORAL | Status: DC
  Administered 2012-01-30 – 2012-01-31 (×2): 60 mg via ORAL
  Filled 2012-01-30 (×2): qty 3

## 2012-01-30 MED ORDER — INSULIN ASPART 100 UNIT/ML ~~LOC~~ SOLN: 10.0000 [IU] | Freq: Three times a day (TID) | SUBCUTANEOUS | Status: DC

## 2012-01-30 MED ORDER — MUPIROCIN 2 % EX OINT
TOPICAL_OINTMENT | Freq: Two times a day (BID) | CUTANEOUS | Status: DC
  Administered 2012-01-30: 22:00:00 via NASAL
  Filled 2012-01-30: qty 22

## 2012-01-30 MED ORDER — INSULIN ASPART 100 UNIT/ML ~~LOC~~ SOLN
0.0000 [IU] | Freq: Every day | SUBCUTANEOUS | Status: DC
  Administered 2012-01-30: 2 [IU] via SUBCUTANEOUS

## 2012-01-30 MED ORDER — ACETAMINOPHEN 325 MG PO TABS: 325.0000 mg | ORAL_TABLET | ORAL | Status: DC | PRN

## 2012-01-30 MED ORDER — INSULIN ASPART 100 UNIT/ML ~~LOC~~ SOLN: 0.0000 [IU] | Freq: Three times a day (TID) | SUBCUTANEOUS | Status: DC

## 2012-01-30 NOTE — Op Note (Signed)
Biv ICD upgrade without immediate complication. [id number].

## 2012-01-30 NOTE — H&P (View-Only) (Signed)
HPI [name] [name] returns today for followup. He is a very pleasant 77 year old man with an ischemic cardiomyopathy and chronic systolic heart failure. He has left bundle branch block. The patient was hospitalized several weeks ago with worsening congestive heart failure, and had a borderline elevated troponin. We initially discussed catheterization but ultimately it was deemed not necessary and he has done well after he was treated with diuretic therapy. The patient is also limited by arthritis in his knees. He is a QRS duration of 180 ms, left bundle branch block, and first-degree AV block. Several months ago, I discussed the possibility of upgrading to a biventricular ICD but he was against this. He now states that he would like to have cardiac resynchronization carried out as he hopes to have more energy and less dyspnea. His creatinine runs in the high ones. Allergies  Allergen Reactions  . Sulfa Antibiotics Swelling and Rash    Hands swell     Current Outpatient Prescriptions  Medication Sig Dispense Refill  . aspirin 81 MG tablet Take 81 mg by mouth 2 (two) times daily.        . carvedilol (COREG) 25 MG tablet Take 37.5 mg by mouth 2 (two) times daily with a meal.      . Cinnamon 500 MG capsule Take 500 mg by mouth 2 (two) times daily. Take 2 tablets daily by mouth      . diazepam (VALIUM) 10 MG tablet Take 5 mg by mouth daily as needed. For anxiety & ringing in ears      . Flaxseed, Linseed, (FLAX SEEDS) POWD Take 5 mLs by mouth daily.      . insulin aspart (NOVOLOG) 100 UNIT/ML injection Inject 10-16 Units into the skin 3 (three) times daily before meals. Scale: sugar 100-150, 10 units. 150-200, 12 units. 200-250, 14 units. 250+, 16 units.      . insulin detemir (LEVEMIR) 100 UNIT/ML injection Inject 20 Units into the skin every morning.       Marland Kitchen LIDODERM 5 % Place 1 patch onto the skin daily as needed. For pain      . lisinopril (PRINIVIL,ZESTRIL) 10 MG tablet Take 1 tablet (10 mg total) by  mouth daily.      . nitroGLYCERIN (NITROSTAT) 0.4 MG SL tablet Place 1 tablet (0.4 mg total) under the tongue every 5 (five) minutes as needed for chest pain.  50 tablet  3  . nitroGLYCERIN (NITROSTAT) 0.4 MG SL tablet Place 0.4 mg under the tongue every 5 (five) minutes as needed. For chest pain      . rosuvastatin (CRESTOR) 20 MG tablet Take 20 mg by mouth at bedtime.        Marland Kitchen spironolactone (ALDACTONE) 25 MG tablet Take 12.5 mg by mouth at bedtime.       Marland Kitchen tiotropium (SPIRIVA HANDIHALER) 18 MCG inhalation capsule Place 1 capsule (18 mcg total) into inhaler and inhale daily.  30 capsule  3  . torsemide (DEMADEX) 20 MG tablet Take 3 tablets (60 mg total) by mouth daily.  90 tablet  3  . traMADol (ULTRAM) 50 MG tablet Two times daily as needed for knee pain  30 tablet  0     Past Medical History  Diagnosis Date  . COPD (chronic obstructive pulmonary disease) 1993  . Hyperlipidemia 1993  . Hypertension 1993  . CAD (coronary artery disease)     bypass graft surgery 05/2006; grafts were widely patent on cath 06/2010   . Ischemic cardiomyopathy  EF 25-30%  . Systolic heart failure     class II  . S/P ICD (internal cardiac defibrillator) procedure 2010    single  . Foot fracture 1953; 1962    left; left  . Chronic kidney disease   . ICD (implantable cardiac defibrillator) in place 2010  . CHF (congestive heart failure)     hospital, med nonadherence 2/5-05/16/09  . Myocardial infarction 2008; 01/06/2012  . Syncope and collapse 01/05/2012  . Shortness of breath 01/05/2012    "all the time; been going on for awhile"  . Diabetes mellitus, type 2 2000  . Arthritis     "hands" (01/06/2012)  . Elbow mass     left; "just noticed it ~ 2 wk ago"  (01/06/2012)  . Anxiety   . Nightmares     with psych eval 2012 at [facility]  . PTSD (post-traumatic stress disorder)   . Tinnitus of left ear   . Deafness in left ear     ROS:   All systems reviewed and negative except as noted in the HPI.   Past  Surgical History  Procedure Date  . Coronary artery bypass graft 2008    3 vessel, class 4 CHF, non Q wave MI 05/19/06  . Appendectomy 1957  . Cardiac defibrillator placement 2010  . Fracture surgery 1953; ~ 1962    left; left     Family History  Problem Relation Age of Onset  . Heart disease Father     MI age 4  . Diabetes Sister   . Hyperlipidemia Sister   . Cancer Maternal Grandmother     liver, cirrhosis  . Depression Neg Hx   . Alcohol abuse Neg Hx   . Drug abuse Neg Hx   . Stroke Neg Hx      History   Social History  . Marital Status: Married    Spouse Name: N/A    Number of Children: 0  . Years of Education: N/A   Occupational History  . 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 Smoking status: Former Smoker -- 0.5 packs/day for 35 years    Types: Cigarettes  . Smokeless tobacco: Never Used   Comment: 01/06/2012 "quit smoking in 2008; slip and have one ocasionally still"  . Alcohol Use: 44.4 oz/week    32 Glasses of wine, 42 Cans of beer per week     01/06/2012 "big bottle of wine at least 3 nights/wk; 6 pack pretty much q night"  . Drug Use: No  . Sexually Active: No   Other Topics Concern  . 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 HEART:  Regular rate rhythm, no murmurs, no rubs, no clicks, split S2. Abd:  soft, obese, positive bowel sounds, no organomegally, no rebound, no guarding Ext:  2 plus pulses, no edema, no cyanosis, no clubbing Skin:  No rashes no nodules Neuro:   CN II through XII intact, motor grossly intact   DEVICE  Normal device function.  See PaceArt for details.   Assess/Plan:

## 2012-01-30 NOTE — Progress Notes (Signed)
SMOKEY IN CATH LAB NOTIFIED OF LAST CXR AND RESULTS AND PER SMOKEY WILL BRING TO CATH LAB HOLDING AND HE WILL TELL DR Ladona Ridgel

## 2012-01-30 NOTE — Interval H&P Note (Signed)
History and Physical Interval Note:  01/30/2012 11:34 AM  [name] [name]  has presented today for surgery, with the diagnosis of CHF  The various methods of treatment have been discussed with the patient and family. After consideration of risks, benefits and other options for treatment, the patient has consented to  Procedure(s) (LRB) with comments: BI-VENTRICULAR IMPLANTABLE CARDIOVERTER DEFIBRILLATOR UPGRADE (N/A) as a surgical intervention .  The patient's history has been reviewed, patient examined, no change in status, stable for surgery.  I have reviewed the patient's chart and labs.  Questions were answered to the patient's satisfaction.     Lewayne Bunting

## 2012-01-30 NOTE — Interval H&P Note (Signed)
History and Physical Interval Note:  01/30/2012 11:33 AM  [name] [name]  has presented today for surgery, with the diagnosis of CHF  The various methods of treatment have been discussed with the patient and family. After consideration of risks, benefits and other options for treatment, the patient has consented to  Procedure(s) (LRB) with comments: BI-VENTRICULAR IMPLANTABLE CARDIOVERTER DEFIBRILLATOR UPGRADE (N/A) as a surgical intervention .  The patient's history has been reviewed, patient examined, no change in status, stable for surgery.  I have reviewed the patient's chart and labs.  Questions were answered to the patient's satisfaction.     Leonia Reeves.D.

## 2012-01-31 ENCOUNTER — Encounter (HOSPITAL_COMMUNITY): Payer: Self-pay | Admitting: Cardiology

## 2012-01-31 ENCOUNTER — Ambulatory Visit (HOSPITAL_COMMUNITY): Payer: Medicare Other

## 2012-01-31 LAB — GLUCOSE, CAPILLARY

## 2012-01-31 NOTE — Discharge Summary (Signed)
Discharge Summary   Patient ID: [name] [name] MRN: [id number], DOB/AGE: [birth year] 75 y.o.  Primary MD: Crawford Givens, MD Primary Cardiologist: Lewayne Bunting MD Admit date: 01/30/2012 D/C date:     01/31/2012      Primary Discharge Diagnoses:  1. ICM w/ Chronic Systolic CHF & LBBB  - s/p removal of single-chamber ICD and placement of Medtronic BiV ICD 10/25  Secondary Discharge Diagnoses:  . COPD (chronic obstructive pulmonary disease) 1993  . Hyperlipidemia 1993  . Hypertension 1993  . CAD (coronary artery disease)     bypass graft surgery 05/2006; grafts were widely patent on cath 06/2010   . Ischemic cardiomyopathy     EF 25-30%; s/p single chamber ICD 2010; upgrade to Medtronic BiV ICD 01/30/12  . Foot fracture 1953; 1962    left; left  . Chronic kidney disease   . Myocardial infarction 2008; 01/06/2012  . Syncope and collapse 01/05/2012  . Shortness of breath 01/05/2012    "all the time; been going on for awhile"  . Diabetes mellitus, type 2 2000  . Arthritis     "hands" (01/06/2012)  . Elbow mass     left; "just noticed it ~ 2 wk ago"  (01/06/2012)  . Anxiety   . Nightmares     with psych eval 2012 at [facility]  . PTSD (post-traumatic stress disorder)   . Tinnitus of left ear   . 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 Boil     "fluid boil left arm" (01/30/2012)    Allergies Allergies  Allergen Reactions  . Sulfa Antibiotics Swelling and Rash    Hands swell    Diagnostic Studies/Procedures:   01/30/12 - Removal of single-chamber ICD and implantation of new BiV ICD Medtronic model 5076 52-cm active fixation pacing lead, serial number [id number] into Right atrium Medtronic model 4196 88 cm bipolar pacing lead, serial number [id number] into Left Ventricle Medtronic BiV ICD serial number [id number] was connected to the new atrial and LV leads and the old RV defibrillation lead    History of Present Illness: 77 y.o. male w/ the above medical problems who presented to [facility] on 01/30/12 for planned upgrade to BiV ICD.  Hospital Course: He arrived to [facility] in stable condition. He underwent removal of the old single-chamber ICD (RV lead kept in place) and implantation of new RA and LV leads and Medtronic BiV ICD. He tolerated the procedure well without complication. Postop CXR was without pneumothorax. Insertion site remained stable. He was seen and evaluated by Dr. Ladona Ridgel who felt he was stable for discharge home with plans for follow up as scheduled below.  Discharge Vitals: Blood pressure 130/80, pulse 79, temperature 98.3 F (36.8 C), temperature source Oral, resp. rate 18, height 5\' 8"  (1.727 m), weight 250 lb (113.399 kg), SpO2 98.00%.  Labs: None   Discharge Medications     Medication List     As of 01/31/2012 11:34 AM    TAKE these medications         aspirin 81 MG tablet   Take 81 mg by mouth 2 (two) times daily.      carvedilol 25 MG tablet   Commonly known as: COREG   Take 37.5 mg by mouth 2 (two) times daily with a meal.      Cinnamon 500 MG capsule   Take 500 mg by mouth 2 (two) times daily. Take 2 tablets daily by mouth  clopidogrel 75 MG tablet   Commonly known as: PLAVIX   Take 75 mg by mouth daily.      diazepam 10 MG tablet   Commonly known as: VALIUM   Take 5 mg by mouth daily as needed. For anxiety & ringing in ears      Flax Seeds Powd   Take 5 mLs by mouth daily.      insulin aspart 100 UNIT/ML injection   Commonly known as: novoLOG   Inject 10-16 Units into the skin 3 (three) times daily before meals. Scale: sugar 100-150, 10 units. 150-200, 12 units. 200-250, 14 units. 250+, 16 units.      insulin detemir 100 UNIT/ML injection   Commonly known as: LEVEMIR   Inject 20 Units into the skin every morning. Add 1 unit every day until AM sugar is less than 150      LIDODERM 5 %   Generic drug: lidocaine   Place 1 patch onto the skin daily as needed. For pain      lisinopril 20 MG tablet    Commonly known as: PRINIVIL,ZESTRIL   Take one half (10 mg) by mouth daily.      nitroGLYCERIN 0.4 MG SL tablet   Commonly known as: NITROSTAT   Place 0.4 mg under the tongue every 5 (five) minutes as needed. For chest pain      rosuvastatin 20 MG tablet   Commonly known as: CRESTOR   Take 20 mg by mouth at bedtime.      spironolactone 25 MG tablet   Commonly known as: ALDACTONE   Take 12.5 mg by mouth at bedtime.      tiotropium 18 MCG inhalation capsule   Commonly known as: SPIRIVA   Place 1 capsule (18 mcg total) into inhaler and inhale daily.      torsemide 20 MG tablet   Commonly known as: DEMADEX   Take 3 tablets (60 mg total) by mouth daily.      traMADol 50 MG tablet   Commonly known as: ULTRAM   Take 50 mg by mouth 2 (two) times daily as needed. for knee pain         Disposition   Discharge Orders    Future Appointments: Provider: Department: Dept Phone: Center:   02/11/2012 8:00 PM [facility] [place] [phone] [facility]   02/23/2012 11:45 AM Laurey Morale, MD [facility] [phone] [facility]   03/11/2012 9:35 AM [facility] [phone] [facility]   04/20/2012 9:45 AM [facility] [place] [phone] [facility]   04/27/2012 10:30 AM Joaquim Nam, MD [facility] [phone] [facility]     Future Orders Please Complete By Expires   Diet - low sodium heart healthy      Increase activity slowly        Follow-up Information    Follow up with [facility]. (Device/Wound check 7-10 days. Our office will call you with your appointment time.)    Contact information:   [address] [place] [address] [phone]      Follow up with Lewayne Bunting, MD. (Our office will call you with your appointment time.)    Contact information:   [facility] [address] [place] [address] [phone]           Outstanding  Labs/Studies:  None  Duration of Discharge Encounter: Greater than 30 minutes including physician and PA time.  Signed, Srinidhi Landers PA-C 01/31/2012, 11:34  AM

## 2012-01-31 NOTE — Op Note (Signed)
[name] [name], [name] NO.:  [id number]  MEDICAL RECORD NO.:  [id number]  LOCATION:  3W21C                        FACILITY:  [facility]  PHYSICIAN:  Doylene Canning. Ladona Ridgel, MD    DATE OF BIRTH:  10/14/1934  DATE OF PROCEDURE:  01/30/2012 DATE OF DISCHARGE:                              OPERATIVE REPORT   PROCEDURE PERFORMED:  Insertion of a new atrial pacing lead, insertion of a new LV pacing lead utilizing coronary sinus venography, removal of an old single-chamber ICD, and insertion of a new biventricular ICD with defibrillation threshold testing.  INTRODUCTION:  The patient is a 77 year old man with worsening congestive heart failure and an ischemic cardiomyopathy.  He has left bundle-branch block with a QRS duration of 180 msec.  The patient is now referred for upgrade to a BiV ICD.  PROCEDURE:  After informed consent was obtained, the patient was taken to the diagnostic EP lab in a fasting state.  After usual preparation and draping, intravenous fentanyl and midazolam was given for sedation. 30 mL of lidocaine was infiltrated into the left infraclavicular region. A 7-cm incision was carried out over this region.  Electrocautery was utilized to dissect down to the fascial plane.  The left subclavian vein was punctured x2.  The Medtronic model 5076 52-cm active fixation pacing lead, serial number [id number] was advanced with a moderate degree of difficulty into the right atrium.  Mapping was carried out in the right atrium at the final site.  The P-waves were 3 with the lead actively fixed and the impedance was 400 ohms.  The threshold was 0.5 V at 0.4 msec and 10 V pacing in the atrium did not stimulate the diaphragm. With the atrial lead in satisfactory position, attention was then turned to placement of left ventricular lead.  The 9.5-French hemostatic sheath was advanced into the subclavian vein.  The 9-French MB II Medtronic guiding catheter along with a 6-French  hexapolar EP catheter was advanced into the coronary sinus.  Venography of the coronary sinus was carried out.  This demonstrated a tortuous lateral vein, which had a early bifurcation.  It also demonstrated a very posterior vein and a high lateral vein.  The lateral vein was chosen for LV lead placement. This was carried out with moderate degree of difficulty utilizing the Medtronic Attain II to subselectively place the LV lead.  A Medtronic model 4196 88 cm bipolar pacing lead, serial number [id number] being advanced into the lateral vein of the left ventricle.  No diaphragmatic stimulation was noted and the threshold was less than a V at 0.5 msec in multiple configurations.  With these satisfactory parameters, the guiding catheter was removed in the usual manner.  This resulted in retraction back of the LV lead, which was reinserted utilizing the Mailman 0.14 guidewire.  The lead was secured to the subpectoral fascia with a figure-of-eight silk suture and the sewing sleeve was secured with silk suture.  Electrocautery was utilized to make a subcutaneous pocket.  Antibiotic irrigation was utilized to irrigate the pocket.  At this point, electrocautery was utilized to enter the ICD generator and the generator was removed with gentle traction.  The  old defibrillator lead was evaluated and found to be working satisfactorily.  The new Medtronic BiV ICD serial number [id number] was connected to the new atrial and LV leads and the old defibrillation lead and placed back in the subcutaneous pocket.  The pocket was revised to accommodate the additional hardware.  At this point, the pocket was again irrigated with antibiotic irrigation.  The incision was closed with 2-0 and 3-0 Vicryl. Benzoin and Steri-Strips were painted on the skin, and I scrubbed out of the case to supervise defibrillation threshold testing.  After the patient was more deeply sedated under my direct supervision with  intravenous fentanyl and Versed, VF was induced with a T-wave shock.  A 20-joule shock was delivered, which terminated ventricular fibrillation and restored sinus rhythm.  At this point, no additional defibrillation threshold testing was carried out, and the patient was allowed to awaken and return to his room in satisfactory condition.  COMPLICATIONS:  There were no immediate procedure complications.  RESULTS:  This demonstrates successful upgrade from a single-chamber defibrillator to a biventricular ICD utilizing coronary sinus venography with successful defibrillation threshold testing.     Doylene Canning. Ladona Ridgel, MD     GWT/MEDQ  D:  01/30/2012  T:  01/31/2012  Job:  [id number]

## 2012-01-31 NOTE — Progress Notes (Signed)
Patient ID: [name] [name], male   DOB: 22-Nov-1934, 77 y.o.   MRN: [id number] Subjective:  S/p BiV ICD upgrade  Objective:  Vital Signs in the last 24 hours: Temp:  [97.4 F (36.3 C)-98.3 F (36.8 C)] 98.3 F (36.8 C) (10/26 0500) Pulse Rate:  [71-79] 79  (10/26 0927) Resp:  [18-20] 18  (10/26 0927) BP: (141-182)/(75-107) 141/75 mmHg (10/26 0500) SpO2:  [93 %-98 %] 98 % (10/26 0927) Weight:  [250 lb (113.399 kg)] 250 lb (113.399 kg) (10/25 1218)  Intake/Output from previous day: 10/25 0701 - 10/26 0700 In: 100 [IV Piggyback:100] Out: 3125 [Urine:3125] Intake/Output from this shift:    Physical Exam: Well appearing NAD HEENT: Unremarkable Neck:  No JVD, no thyromegally Lungs:  Clear with no wheezes HEART:  Regular rate rhythm, no murmurs, no rubs, no clicks Abd:  Flat, positive bowel sounds, no organomegally, no rebound, no guarding Ext:  2 plus pulses, no edema, no cyanosis, no clubbing Skin:  No rashes no nodules Neuro:  CN II through XII intact, motor grossly intact  Lab Results: No results found for this basename: WBC:2,HGB:2,PLT:2 in the last 72 hours No results found for this basename: NA:2,K:2,CL:2,CO2:2,GLUCOSE:2,BUN:2,CREATININE:2 in the last 72 hours No results found for this basename: TROPONINI:2,CK,MB:2 in the last 72 hours Hepatic Function Panel No results found for this basename: PROT,ALBUMIN,AST,ALT,ALKPHOS,BILITOT,BILIDIR,IBILI in the last 72 hours No results found for this basename: CHOL in the last 72 hours No results found for this basename: PROTIME in the last 72 hours  Imaging: Dg Chest 2 View  01/31/2012  *RADIOLOGY REPORT*  Clinical Data: Status post ICD placement.  CHEST - 2 VIEW  Comparison: One-view chest 01/06/2012.  Findings: The AICD has been revised.  Additional pacemaker and leads have been placed in the right atrium and with great cardiac vein.  Heart remains enlarged.  There is no edema or effusion to suggest failure.  The lung volumes have  improved.  Emphysematous changes are evident.  Degenerative changes are present within the thoracic spine.  IMPRESSION:  1.  Revision of pacemaker as described. 2.  No radiographic evidence of complication. 3.  Stable cardiomegaly without failure. 4.  Emphysema.   Original Report Authenticated By: Jamesetta Orleans. MATTERN, M.D.     Cardiac Studies: Tele - NSR with BiV pacing Assessment/Plan:  1. S/p BiV ICD upgrade 2. Chronic systolic CHF 3. ICM Rec: ok for discharge with usual followup. Check incision in 1-2 weeks. No heavy lifting. No driving for one week.  LOS: 1 day    Rhett Mutschler,M.D. 01/31/2012, 10:09 AM

## 2012-02-11 ENCOUNTER — Encounter (HOSPITAL_BASED_OUTPATIENT_CLINIC_OR_DEPARTMENT_OTHER): Payer: Medicare Other

## 2012-02-11 ENCOUNTER — Ambulatory Visit (INDEPENDENT_AMBULATORY_CARE_PROVIDER_SITE_OTHER): Payer: Medicare Other | Admitting: *Deleted

## 2012-02-11 DIAGNOSIS — I2589 Other forms of chronic ischemic heart disease: Secondary | ICD-10-CM

## 2012-02-11 DIAGNOSIS — I5023 Acute on chronic systolic (congestive) heart failure: Secondary | ICD-10-CM

## 2012-02-11 DIAGNOSIS — I509 Heart failure, unspecified: Secondary | ICD-10-CM

## 2012-02-11 LAB — ICD DEVICE OBSERVATION
AL AMPLITUDE: 4.25 mv
AL IMPEDENCE ICD: 456 Ohm
AL THRESHOLD: 0.875 V
BATTERY VOLTAGE: 3.1824 V
LV LEAD IMPEDENCE ICD: 437 Ohm
RV LEAD IMPEDENCE ICD: 494 Ohm
TOT-0002: 0
TOT-0006: 20131025000000
TZAT-0001ATACH: 1
TZAT-0001ATACH: 2
TZAT-0001ATACH: 3
TZAT-0001FASTVT: 1
TZAT-0001SLOWVT: 1
TZAT-0001SLOWVT: 2
TZAT-0002ATACH: NEGATIVE
TZAT-0002FASTVT: NEGATIVE
TZAT-0012ATACH: 150 ms
TZAT-0013SLOWVT: 3
TZAT-0013SLOWVT: 3
TZAT-0018ATACH: NEGATIVE
TZAT-0018ATACH: NEGATIVE
TZAT-0018ATACH: NEGATIVE
TZAT-0018FASTVT: NEGATIVE
TZAT-0018SLOWVT: NEGATIVE
TZAT-0018SLOWVT: NEGATIVE
TZAT-0019ATACH: 6 V
TZAT-0019SLOWVT: 8 V
TZAT-0020ATACH: 1.5 ms
TZAT-0020SLOWVT: 1.5 ms
TZAT-0020SLOWVT: 1.5 ms
TZON-0004SLOWVT: 36
TZON-0004VSLOWVT: 40
TZON-0005SLOWVT: 12
TZST-0001ATACH: 4
TZST-0001ATACH: 5
TZST-0001ATACH: 6
TZST-0001FASTVT: 3
TZST-0001FASTVT: 4
TZST-0001FASTVT: 6
TZST-0001SLOWVT: 3
TZST-0001SLOWVT: 5
TZST-0001SLOWVT: 6
TZST-0002ATACH: NEGATIVE
TZST-0002ATACH: NEGATIVE
TZST-0002ATACH: NEGATIVE
TZST-0002FASTVT: NEGATIVE
TZST-0002FASTVT: NEGATIVE
TZST-0003SLOWVT: 35 J
TZST-0003SLOWVT: 35 J
VENTRICULAR PACING ICD: 99.2 pct

## 2012-02-11 NOTE — Progress Notes (Signed)
Wound check-ICD 

## 2012-02-11 NOTE — Patient Instructions (Addendum)
Return office visit 05/05/12 @ 2:15pm with Dr. Ladona Ridgel.

## 2012-02-22 ENCOUNTER — Encounter: Payer: Self-pay | Admitting: Family Medicine

## 2012-02-23 ENCOUNTER — Encounter: Payer: Self-pay | Admitting: Cardiology

## 2012-02-23 ENCOUNTER — Ambulatory Visit (INDEPENDENT_AMBULATORY_CARE_PROVIDER_SITE_OTHER): Payer: Medicare Other | Admitting: Cardiology

## 2012-02-23 VITALS — BP 152/70 | HR 72 | Ht 68.0 in | Wt 250.0 lb

## 2012-02-23 DIAGNOSIS — N189 Chronic kidney disease, unspecified: Secondary | ICD-10-CM

## 2012-02-23 DIAGNOSIS — I5022 Chronic systolic (congestive) heart failure: Secondary | ICD-10-CM

## 2012-02-23 DIAGNOSIS — E785 Hyperlipidemia, unspecified: Secondary | ICD-10-CM

## 2012-02-23 DIAGNOSIS — N184 Chronic kidney disease, stage 4 (severe): Secondary | ICD-10-CM | POA: Insufficient documentation

## 2012-02-23 DIAGNOSIS — I2510 Atherosclerotic heart disease of native coronary artery without angina pectoris: Secondary | ICD-10-CM

## 2012-02-23 DIAGNOSIS — I25810 Atherosclerosis of coronary artery bypass graft(s) without angina pectoris: Secondary | ICD-10-CM

## 2012-02-23 MED ORDER — LISINOPRIL 10 MG PO TABS: 10.0000 mg | ORAL_TABLET | Freq: Every day | ORAL | Status: DC

## 2012-02-23 MED ORDER — SPIRONOLACTONE 25 MG PO TABS: 25.0000 mg | ORAL_TABLET | Freq: Every day | ORAL | Status: DC

## 2012-02-23 NOTE — Progress Notes (Signed)
Patient ID: [name] [name], male   DOB: March 30, 1935, 77 y.o.   MRN: [id number] PCP: Dr. Para March  77 yo with history of CAD s/p CABG, ischemic cardiomyopathy/systolic CHF, LBBB, COPD, and CKD presents for cardiology followup.  He had CABG in 2008.  Last cath in 3/12 showed patent grafts.  EF is 20-25%.  Over the last year, he has noted decreased energy and more fatigue.  He has also become more and more short of breath with exertion over the last few months, culminating in an admission earlier in 10/13 with acute on chronic systolic CHF.  He was volume overloaded.  He had a mild TnI elevation with no chest pain.  He was diuresed and discharged.  Since his last appointment with me, his ICD was upgraded to a Medtronic CRT-D system.    His wife feels like he is doing significantly better.  He is able to walk about 100 yards without exertional dyspnea.  He walks his dog.  Weight is stable.  Main limitation at this point seems to be knee pain.  He seems discouraged by the knee pain.  He has had an appointment with nephrology.  Last creatinine was 2.0.  No chest pain, orthopnea, or PND.   LDL was very high when last checked, but he had not been taking his statin.  He has subsequently restarted Crestor 20 mg daily.   He continues to snore at night and has daytime sleepiness.  He cancelled his planned sleep study but wants to reschedule it.   Labs (10/13): K 3.6 => 4.2, creatinine 1.79 => 2.1 => 2.0, HDL 45, LDL 157, BNP 219  PMH: 1. CAD: S/p CABG 2008.  Cath 3/12 with totally occluded mLAD and RCA, 80% LCx stenosis, patent LIMA-LAD, patent SVG-OM, 40% stenosis in SVG-PDA.   2. Ischemic cardiomyopathy: Echo (10/13) with severely dilated LV, moderate LVH, EF 20-25%, mild MR.  Medtronic CRT-D device 10/13.  3. LBBB 4. COPD 5. Type II diabetes 6. Hyperlipidemia 7. HTN 8. CKD 9. Obesity 10. OA: knees. 11. 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    FH: Father with MI at 24  ROS: All systems reviewed and negative except as per HPI.   Current Outpatient Prescriptions  Medication Sig Dispense Refill  . aspirin 81 MG tablet Take 81 mg by mouth 2 (two) times daily.       . carvedilol (COREG) 25 MG tablet Take 37.5 mg by mouth 2 (two) times daily with a meal.      . Cinnamon 500 MG capsule Take 500 mg by mouth 2 (two) times daily. Take 2 tablets daily by mouth      . clopidogrel (PLAVIX) 75 MG tablet Take 75 mg by mouth daily.      . diazepam (VALIUM) 10 MG tablet Take 5 mg by mouth daily as needed. For anxiety & ringing in ears      . Flaxseed, Linseed, (FLAX SEEDS) POWD Take 5 mLs by mouth daily.      . insulin aspart (NOVOLOG) 100 UNIT/ML injection Inject 10-16 Units into the skin 3 (three) times daily before meals. Scale: sugar 100-150, 10 units. 150-200, 12 units. 200-250, 14 units. 250+, 16 units.      . insulin detemir (LEVEMIR) 100 UNIT/ML injection Inject 25 Units into the skin every morning. Add 1 unit every day until AM sugar is less than 150      . LIDODERM 5 % Place 1 patch  onto the skin daily as needed. For pain      . nitroGLYCERIN (NITROSTAT) 0.4 MG SL tablet Place 0.4 mg under the tongue every 5 (five) minutes as needed. For chest pain      . rosuvastatin (CRESTOR) 20 MG tablet Take 20 mg by mouth at bedtime.       Marland Kitchen tiotropium (SPIRIVA HANDIHALER) 18 MCG inhalation capsule Place 1 capsule (18 mcg total) into inhaler and inhale daily.  30 capsule  3  . torsemide (DEMADEX) 20 MG tablet Take 3 tablets (60 mg total) by mouth daily.  90 tablet  3  . traMADol (ULTRAM) 50 MG tablet Take 50 mg by mouth 2 (two) times daily as needed. for knee pain      . [DISCONTINUED] lisinopril (PRINIVIL,ZESTRIL) 20 MG tablet Take one half (10 mg) by mouth daily.      Marland Kitchen lisinopril (PRINIVIL,ZESTRIL) 10 MG tablet Take 1 tablet (10 mg total) by mouth daily.  30 tablet  6  . spironolactone (ALDACTONE) 25 MG tablet Take 1 tablet (25 mg total) by  mouth daily.  30 tablet  6    BP 152/70  Pulse 72  Ht 5\' 8"  (1.727 m)  Wt 250 lb (113.399 kg)  BMI 38.01 kg/m2 General: NAD, obese Neck: No JVD, no thyromegaly or thyroid nodule.  Lungs: Clear to auscultation bilaterally with normal respiratory effort. CV: Nondisplaced PMI.  Heart regular S1/S2, no S3/S4, no murmur.  Trace ankle edema.  No carotid bruit.  Normal pedal pulses.  Abdomen: Soft, nontender, no hepatosplenomegaly, no distention.  Neurologic: Alert and oriented x 3.  Psych: Normal affect. Extremities: No clubbing or cyanosis.   Assessment/Plan: 1. Chronic systolic CHF: EF 21-30% by last echo.  Status post CRT-D upgrade.  He seems to be doing better symptomatically, NYHA class II with main limitation from his knee pain.  He does not appear volume overloaded.  - Continue current Coreg and lisinopril. - Increase spironolactone to 25 mg daily. - Continue current torsemide.  - BMET/BNP in 2 wks.  2. CAD: s/p CABG.  No recent chest pain.  Last cath in 3/12 with patent grafts.  He had a mild elevation in troponin during recent admission with acute on chronic systolic CHF.  I think that this was most likely demand ischemia in the setting of significant volume overload.  We held off on cath given concern for demand ischemia and CKD.  - Continue ASA 81, statin, and Coreg.   3. Hyperlipidemia: Goal LDL < 70 with CAD.  He is now taking Crestor daily.  Will get lipids with BMET in 2 wks.  4. OSA: Symptoms concerning for OSA.  I will reschedule a sleep study.  5. CKD: Creatinine around 2.  Will follow carefully with uptitration of spironolactone.  He has not had a problem with elevated K in the past.  He follows with nephrology.   Marca Ancona 02/23/2012

## 2012-02-23 NOTE — Patient Instructions (Addendum)
Increase spironolactone to 25mg  daily.  Your physician recommends that you return for lab work in: 2 weeks--BMET/BNP.  Dr Shirlee Latch recommends that you walk at least 20 minutes daily.   Reschedule your appt for the sleep study.  Your physician recommends that you schedule a follow-up appointment in: 2 months with Dr Shirlee Latch.

## 2012-03-08 ENCOUNTER — Other Ambulatory Visit (INDEPENDENT_AMBULATORY_CARE_PROVIDER_SITE_OTHER): Payer: Medicare Other

## 2012-03-08 ENCOUNTER — Encounter: Payer: Self-pay | Admitting: Internal Medicine

## 2012-03-08 DIAGNOSIS — I25810 Atherosclerosis of coronary artery bypass graft(s) without angina pectoris: Secondary | ICD-10-CM

## 2012-03-08 DIAGNOSIS — I5022 Chronic systolic (congestive) heart failure: Secondary | ICD-10-CM

## 2012-03-08 DIAGNOSIS — I2510 Atherosclerotic heart disease of native coronary artery without angina pectoris: Secondary | ICD-10-CM

## 2012-03-08 LAB — BASIC METABOLIC PANEL
BUN: 47 mg/dL — ABNORMAL HIGH (ref 6–23)
Calcium: 9.2 mg/dL (ref 8.4–10.5)
Creatinine, Ser: 1.9 mg/dL — ABNORMAL HIGH (ref 0.4–1.5)
GFR: 37.34 mL/min — ABNORMAL LOW (ref >60.00)

## 2012-03-08 LAB — LIPID PANEL: Cholesterol: 179 mg/dL (ref 0–200)

## 2012-03-08 LAB — BRAIN NATRIURETIC PEPTIDE: Pro B Natriuretic peptide (BNP): 209 pg/mL — ABNORMAL HIGH (ref 0.0–100.0)

## 2012-03-09 ENCOUNTER — Telehealth: Payer: Self-pay | Admitting: Family Medicine

## 2012-03-09 ENCOUNTER — Ambulatory Visit (INDEPENDENT_AMBULATORY_CARE_PROVIDER_SITE_OTHER): Payer: Medicare Other | Admitting: Family Medicine

## 2012-03-09 ENCOUNTER — Encounter: Payer: Self-pay | Admitting: Family Medicine

## 2012-03-09 ENCOUNTER — Other Ambulatory Visit: Payer: Self-pay | Admitting: *Deleted

## 2012-03-09 VITALS — BP 166/76 | HR 73 | Temp 97.4°F | Wt 253.0 lb

## 2012-03-09 DIAGNOSIS — R42 Dizziness and giddiness: Secondary | ICD-10-CM

## 2012-03-09 DIAGNOSIS — E669 Obesity, unspecified: Secondary | ICD-10-CM

## 2012-03-09 DIAGNOSIS — E7800 Pure hypercholesterolemia, unspecified: Secondary | ICD-10-CM

## 2012-03-09 MED ORDER — MECLIZINE HCL 25 MG PO TABS: 25.0000 mg | ORAL_TABLET | Freq: Three times a day (TID) | ORAL | Status: DC | PRN

## 2012-03-09 MED ORDER — ROSUVASTATIN CALCIUM 40 MG PO TABS: 40.0000 mg | ORAL_TABLET | Freq: Every day | ORAL | Status: DC

## 2012-03-09 NOTE — Telephone Encounter (Signed)
[facility] Triage Call Report Triage Record Num: [id number] Operator: Roselyn Meier Patient Name: [name] [name] Call Date & Time: 03/08/2012 5:39:13PM Patient Phone: [phone] PCP: Crawford Givens Patient Gender: Male PCP Fax : Patient DOB: 01/13/1935 Practice Name[facility] Reason for Call: Caller: [name]/Spouse; PCP: Crawford Givens Clelia Croft) [facility]); CB#: [phone]; Call regarding Dizziness; Caller states that on 03/05/12 pt began having dizziness. Pt was seen at his heart doctor (Dr Jearld Pies) who had lab work done and vital signs done. Caller states it is worse when he bends over and the room is spinning when he gets up. Triaged patient per Dizziness or Vertigo Protocol. See Provider within 24 Hours Disposition for 'Symptoms worsen with movement of head or looking up and not previously evaluated'. Care advice given per protocol. Appt scheduled for 03/09/12 at 1400 with Dr Para March Protocol(s) Used: Dizziness or Vertigo Recommended Outcome per Protocol: See Provider within 24 hours Reason for Outcome: Symptoms worsen with movement of head or looking up AND not previously evaluated Care Advice: Call EMS 911 if patient develops confusion, decreased level of consciousness, chest pain, shortness of breath, or focal neurologic abnormalities such as facial droop or weakness of one extremity. ~ ~ Call provider immediately if have difficulty walking, vision problems, or weakness. ~ SYMPTOM / CONDITION MANAGEMENT 03/08/2012 5:49:23PM Page 1 of 1 [email or web address]

## 2012-03-09 NOTE — Patient Instructions (Signed)
I would take meclizine every 8 hours as needed if you have more vertigo symptoms.  It may not happen again for years, but you may have another episode in the near future.  It can be very unpredictable.  Take care.

## 2012-03-09 NOTE — Progress Notes (Signed)
Room spins with position change, laying back and turning head, leaning over.  Started 2 days ago.  Lasts a few seconds and then stops.  It always spins in the same direction.  It had happened similarly years ago.  No sx today.  No CP, SOB, BLE edema.  No med changes.    Ran over the dog leash with lawn mower Sunday and it whipped up an hit L side of head.  Td up to date per patient.  No LOC.   Meds, vitals, and allergies reviewed.   ROS: See HPI.  Otherwise, noncontributory.  nad Curved abrasion on L side of scalp w/o FB noted Tm wnl Nasal and OP exam wnl Neck supple rrr ctab CN 2-12 wnl B, S/S/DTR wnl x4 except for longstanding dec in hearing.   Neg DHP.

## 2012-03-10 DIAGNOSIS — R42 Dizziness and giddiness: Secondary | ICD-10-CM | POA: Insufficient documentation

## 2012-03-10 DIAGNOSIS — E669 Obesity, unspecified: Secondary | ICD-10-CM | POA: Insufficient documentation

## 2012-03-10 NOTE — Assessment & Plan Note (Signed)
D/w pt about weight loss.  

## 2012-03-10 NOTE — Assessment & Plan Note (Signed)
Likely BPV, resolved now.  D/w pt about anatomy and path/phys.  Meclizine prn.  He agrees.

## 2012-03-11 ENCOUNTER — Other Ambulatory Visit: Payer: Medicare Other

## 2012-03-18 ENCOUNTER — Other Ambulatory Visit: Payer: Self-pay | Admitting: *Deleted

## 2012-03-18 NOTE — Telephone Encounter (Signed)
Opened in Error.

## 2012-03-25 ENCOUNTER — Ambulatory Visit (HOSPITAL_BASED_OUTPATIENT_CLINIC_OR_DEPARTMENT_OTHER): Payer: Medicare Other | Attending: Cardiology

## 2012-03-25 VITALS — Ht 68.0 in | Wt 250.0 lb

## 2012-03-25 DIAGNOSIS — I5022 Chronic systolic (congestive) heart failure: Secondary | ICD-10-CM

## 2012-03-25 DIAGNOSIS — G4733 Obstructive sleep apnea (adult) (pediatric): Secondary | ICD-10-CM | POA: Insufficient documentation

## 2012-03-25 DIAGNOSIS — E785 Hyperlipidemia, unspecified: Secondary | ICD-10-CM

## 2012-03-25 DIAGNOSIS — N189 Chronic kidney disease, unspecified: Secondary | ICD-10-CM

## 2012-03-25 DIAGNOSIS — I25810 Atherosclerosis of coronary artery bypass graft(s) without angina pectoris: Secondary | ICD-10-CM

## 2012-04-13 ENCOUNTER — Other Ambulatory Visit: Payer: Medicare Other

## 2012-04-13 ENCOUNTER — Telehealth: Payer: Self-pay | Admitting: *Deleted

## 2012-04-13 DIAGNOSIS — G4710 Hypersomnia, unspecified: Secondary | ICD-10-CM

## 2012-04-13 DIAGNOSIS — G4730 Sleep apnea, unspecified: Secondary | ICD-10-CM

## 2012-04-13 NOTE — Procedures (Signed)
NAME:  [name] [name], CHANCY NO.:  [id number]  MEDICAL RECORD NO.:  [id number]          PATIENT TYPE:  OUT  LOCATION:  SLEEP CENTER                 FACILITY:  [facility]  PHYSICIAN:  Barbaraann Share, MD,FCCPDATE OF BIRTH:  05-[birth year]1936  DATE OF STUDY:  03/25/2012                           NOCTURNAL POLYSOMNOGRAM  REFERRING PHYSICIAN:  Marca Ancona, MD  INDICATION FOR STUDY:  Hypersomnia with sleep apnea.  EPWORTH SLEEPINESS SCORE:  11.  MEDICATIONS:  SLEEP ARCHITECTURE:  The patient had a total sleep time of 210 minutes with no slow-wave sleep and very little REM.  Sleep onset latency was normal at 25 minutes, and REM onset was prolonged at 165 minutes.  Sleep efficiency was poor at 53%.  RESPIRATORY DATA:  The patient was found to have 63 apneas and 45 obstructive hypopneas, giving him an apnea-hypopnea index of 31 events per hour.  The events occurred primarily in the supine position, and there was very loud snoring noted throughout.  OXYGEN DATA:  There was O2 desaturation as low as 74% with the patient's obstructive events.  CARDIAC DATA:  The patient appeared to have both atrial and ventricular paced rhythm during the study.  MOVEMENT-PARASOMNIA:  The patient was found to have a 128 periodic limb movements with very little sleep disruption.  There were no abnormal behavior seen.  IMPRESSIONS-RECOMMENDATIONS: 1. Moderate obstructive sleep apnea/hypopnea syndrome, with an AHI of     31 events per hour and oxygen desaturation as low as 74%.     Treatment for this degree of sleep apnea can include a trial of     weight loss alone, upper airway surgery, dental appliance, and also     CPAP. 2. The patient appeared to have both atrial and ventricular paced     rhythm during the night.  There were no cardiac records available     at the time of interpretation to review.       Barbaraann Share, MD,FCCP Diplomate, American Board of Sleep Medicine    [facility]/MEDQ   D:  04/13/2012 08:40:22  T:  04/13/2012 40:98:11  Job:  [id number]

## 2012-04-13 NOTE — Telephone Encounter (Signed)
Laurey Morale, MD       Sent:  Tue April 13, 2012 12:18 PM   To:  Jacqlyn Krauss, RN                 Message     Moderate OSA, needs CPAP, refer to pulmonary sleep medicine.    ----- Message -----   From: Barbaraann Share, MD   Sent: 04/13/2012 10:42 AM   To: Laurey Morale, MD, Joaquim Nam, MD   04/13/12 pt advised,verbalized understanding.

## 2012-04-20 ENCOUNTER — Other Ambulatory Visit: Payer: Medicare Other

## 2012-04-27 ENCOUNTER — Other Ambulatory Visit (INDEPENDENT_AMBULATORY_CARE_PROVIDER_SITE_OTHER): Payer: Medicare Other

## 2012-04-27 ENCOUNTER — Ambulatory Visit: Payer: Medicare Other | Admitting: Family Medicine

## 2012-04-27 DIAGNOSIS — E7800 Pure hypercholesterolemia, unspecified: Secondary | ICD-10-CM

## 2012-04-27 DIAGNOSIS — E119 Type 2 diabetes mellitus without complications: Secondary | ICD-10-CM

## 2012-04-27 LAB — HEPATIC FUNCTION PANEL
ALT: 28 U/L (ref 0–53)
Albumin: 4 g/dL (ref 3.5–5.2)
Bilirubin, Direct: 0 mg/dL (ref 0.0–0.3)
Total Protein: 7.2 g/dL (ref 6.0–8.3)

## 2012-04-27 LAB — LIPID PANEL
Cholesterol: 138 mg/dL (ref 0–200)
LDL Cholesterol: 61 mg/dL (ref 0–99)
Triglycerides: 159 mg/dL — ABNORMAL HIGH (ref 0.0–149.0)
VLDL: 31.8 mg/dL (ref 0.0–40.0)

## 2012-04-27 LAB — HEMOGLOBIN A1C: Hgb A1c MFr Bld: 10.1 % — ABNORMAL HIGH (ref 4.6–6.5)

## 2012-05-03 ENCOUNTER — Ambulatory Visit: Payer: Medicare Other | Admitting: Cardiology

## 2012-05-04 ENCOUNTER — Encounter: Payer: Self-pay | Admitting: Family Medicine

## 2012-05-04 ENCOUNTER — Ambulatory Visit (INDEPENDENT_AMBULATORY_CARE_PROVIDER_SITE_OTHER): Payer: Medicare Other | Admitting: Family Medicine

## 2012-05-04 VITALS — BP 130/64 | HR 80 | Temp 97.5°F | Wt 251.0 lb

## 2012-05-04 DIAGNOSIS — E119 Type 2 diabetes mellitus without complications: Secondary | ICD-10-CM

## 2012-05-04 NOTE — Progress Notes (Signed)
Diabetes:  Using medications without difficulties:yes Hypoglycemic episodes:no Hyperglycemic episodes:yes Feet problems: no Blood Sugars averaging: ~180 fasting in AM Had steroid injection in knee, with some relief of pain in knee  D/w pt about weight and diet.  He cut out sugars and etoh, per his report.   PMH and SH reviewed  Meds, vitals, and allergies reviewed.   ROS: See HPI.  Otherwise negative.    GEN: nad, alert and oriented HEENT: mucous membranes moist NECK: supple w/o LA CV: rrr.  ICD noted on L chest wall PULM: ctab, no inc wob ABD: soft, +bs EXT: no edema SKIN: no acute rash  Diabetic foot exam: Normal inspection No skin breakdown No calluses  Normal DP pulses Normal sensation to light touch and monofilament Nails thickened

## 2012-05-04 NOTE — Patient Instructions (Addendum)
Increase the levemir by 1 unit a day until your AM sugar is below 150. Call me with an update at that point.  Recheck in 3 months.

## 2012-05-04 NOTE — Assessment & Plan Note (Signed)
Uncontrolled, d/w pt about not missing doses, DM2 diet.  Will have him inc levemir until his AMs are <150.  I have asked him to call me at that point so we can make plans about his mealtime insulin.  All discussed in detail. He agrees.

## 2012-05-05 ENCOUNTER — Ambulatory Visit (INDEPENDENT_AMBULATORY_CARE_PROVIDER_SITE_OTHER): Payer: Medicare Other | Admitting: Internal Medicine

## 2012-05-05 ENCOUNTER — Encounter: Payer: Self-pay | Admitting: Internal Medicine

## 2012-05-05 VITALS — BP 120/60 | HR 78 | Ht 68.0 in | Wt 253.1 lb

## 2012-05-05 DIAGNOSIS — I5022 Chronic systolic (congestive) heart failure: Secondary | ICD-10-CM

## 2012-05-05 DIAGNOSIS — Z95810 Presence of automatic (implantable) cardiac defibrillator: Secondary | ICD-10-CM

## 2012-05-05 DIAGNOSIS — I509 Heart failure, unspecified: Secondary | ICD-10-CM

## 2012-05-05 DIAGNOSIS — I428 Other cardiomyopathies: Secondary | ICD-10-CM

## 2012-05-05 DIAGNOSIS — I5023 Acute on chronic systolic (congestive) heart failure: Secondary | ICD-10-CM

## 2012-05-05 LAB — ICD DEVICE OBSERVATION
AL AMPLITUDE: 4.1 mv
AL IMPEDENCE ICD: 399 Ohm
ATRIAL PACING ICD: 0.48 pct
BATTERY VOLTAGE: 3.1756 V
LV LEAD IMPEDENCE ICD: 399 Ohm
RV LEAD IMPEDENCE ICD: 551 Ohm
RV LEAD THRESHOLD: 0.75 V
TOT-0002: 0
TOT-0006: 20131025000000
TZAT-0001ATACH: 1
TZAT-0001ATACH: 3
TZAT-0001FASTVT: 1
TZAT-0001SLOWVT: 1
TZAT-0001SLOWVT: 2
TZAT-0002ATACH: NEGATIVE
TZAT-0002ATACH: NEGATIVE
TZAT-0002FASTVT: NEGATIVE
TZAT-0005SLOWVT: 88 pct
TZAT-0005SLOWVT: 91 pct
TZAT-0012ATACH: 150 ms
TZAT-0013SLOWVT: 3
TZAT-0013SLOWVT: 3
TZAT-0018ATACH: NEGATIVE
TZAT-0018ATACH: NEGATIVE
TZAT-0018SLOWVT: NEGATIVE
TZAT-0018SLOWVT: NEGATIVE
TZAT-0019ATACH: 6 V
TZAT-0019ATACH: 6 V
TZAT-0019SLOWVT: 8 V
TZAT-0019SLOWVT: 8 V
TZAT-0020ATACH: 1.5 ms
TZON-0004SLOWVT: 36
TZON-0005SLOWVT: 12
TZST-0001ATACH: 5
TZST-0001ATACH: 6
TZST-0001FASTVT: 3
TZST-0001FASTVT: 4
TZST-0001FASTVT: 5
TZST-0001SLOWVT: 3
TZST-0001SLOWVT: 4
TZST-0001SLOWVT: 6
TZST-0002ATACH: NEGATIVE
TZST-0002ATACH: NEGATIVE
TZST-0002FASTVT: NEGATIVE
TZST-0002FASTVT: NEGATIVE
TZST-0002FASTVT: NEGATIVE
TZST-0002FASTVT: NEGATIVE
TZST-0003SLOWVT: 35 J
VENTRICULAR PACING ICD: 98.79 pct
VF: 0

## 2012-05-05 NOTE — Patient Instructions (Addendum)
Your physician recommends that you schedule a follow-up appointment in: 3 months with device clinic and in Oct 2014 with Dr Ladona Ridgel

## 2012-05-05 NOTE — Assessment & Plan Note (Signed)
His biventricular ICD is working normally. Left ventricular threshold is good at 1 V at 0.5 ms.

## 2012-05-05 NOTE — Progress Notes (Signed)
HPI   [name] returns today for followup. He is a pleasant 78 yo man with a h/o ICM, s/p MI, chronic systolic CHF, LBBB, s/p BiV ICD upgrade 3 months ago. In the interim he has done well except for arthritis in his left knee. His dyspnea is improved but he admits to a sedentary lifestyle secondary to problems with his left knee.  Allergies  Allergen Reactions  . Sulfa Antibiotics Swelling and Rash    Hands swell     Current Outpatient Prescriptions  Medication Sig Dispense Refill  . aspirin 81 MG tablet Take 81 mg by mouth 2 (two) times daily.       . carvedilol (COREG) 25 MG tablet Take 37.5 mg by mouth 2 (two) times daily with a meal.      . Cinnamon 500 MG capsule Take 500 mg by mouth 2 (two) times daily. Take 2 tablets daily by mouth      . clopidogrel (PLAVIX) 75 MG tablet Take 75 mg by mouth daily.      . diazepam (VALIUM) 10 MG tablet Take 5 mg by mouth daily as needed. For anxiety & ringing in ears      . ergocalciferol (VITAMIN D2) 50000 UNITS capsule Take 50,000 Units by mouth every 30 (thirty) days.      . Flaxseed, Linseed, (FLAX SEEDS) POWD Take 5 mLs by mouth daily.      . insulin aspart (NOVOLOG) 100 UNIT/ML injection Inject 10-16 Units into the skin 3 (three) times daily before meals. Scale: sugar 100-150, 10 units. 150-200, 12 units. 200-250, 14 units. 250+, 16 units.      . insulin detemir (LEVEMIR) 100 UNIT/ML injection Inject 22 Units into the skin every morning. Add 1 unit every day until AM sugar is less than 150      . LIDODERM 5 % Place 1 patch onto the skin daily as needed. For pain      . lisinopril (PRINIVIL,ZESTRIL) 10 MG tablet Take 1 tablet (10 mg total) by mouth daily.  30 tablet  6  . metFORMIN (GLUCOPHAGE) 1000 MG tablet Take 1,000 mg by mouth 2 (two) times daily with a meal.      . nitroGLYCERIN (NITROSTAT) 0.4 MG SL tablet Place 0.4 mg under the tongue every 5 (five) minutes as needed. For chest pain      . rosuvastatin (CRESTOR) 40 MG tablet Take 1  tablet (40 mg total) by mouth daily.  30 tablet  3  . spironolactone (ALDACTONE) 25 MG tablet Take 1 tablet (25 mg total) by mouth daily.  30 tablet  6  . torsemide (DEMADEX) 20 MG tablet Take 3 tablets (60 mg total) by mouth daily.  90 tablet  3     Past Medical History  Diagnosis Date  . COPD (chronic obstructive pulmonary disease) 1993  . Hyperlipidemia 1993  . Hypertension 1993  . CAD (coronary artery disease)     bypass graft surgery 05/2006; grafts were widely patent on cath 06/2010   . Ischemic cardiomyopathy     EF 25-30%; s/p single chamber ICD 2010; upgrade to Medtronic BiV ICD 01/30/12  . Systolic heart failure     class II  . Foot fracture 1953; 1962    left; left  . Chronic kidney disease   . Myocardial infarction 2008; 01/06/2012  . Syncope and collapse 01/05/2012  . Shortness of breath 01/05/2012    "all the time; been going on for awhile"  . Diabetes mellitus, type 2 2000  .  Arthritis     "hands" (01/06/2012)  . Elbow mass     left; "just noticed it ~ 2 wk ago"  (01/06/2012)  . Anxiety   . Nightmares     with psych eval 2012 at [facility]  . PTSD (post-traumatic stress disorder)   . Tinnitus of left ear   . 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 Boil     "fluid boil left arm" (01/30/2012)  . LBBB (left bundle branch block)     ROS:   All systems reviewed and negative except as noted in the HPI.   Past Surgical History  Procedure Date  . Coronary artery bypass graft 2008    3 vessel, class 4 CHF, non Q wave MI 05/19/06  . Appendectomy 1957  . Cardiac defibrillator placement 01/30/2012    biventricular ICD  . Fracture surgery 1953; ~ 1962    left; left  . Cardiac defibrillator placement 01/30/2012    biventricular ICD  . Insert / replace / remove pacemaker 01/30/2012    biventricular ICD placed     Family History  Problem Relation Age of Onset  . Heart disease Father     MI age 19  . Diabetes Sister   . Hyperlipidemia Sister   . Cancer Maternal Grandmother      liver, cirrhosis  . Depression Neg Hx   . Alcohol abuse Neg Hx   . Drug abuse Neg Hx   . Stroke Neg Hx      History   Social History  . Marital Status: Married    Spouse Name: N/A    Number of Children: 0  . Years of Education: N/A   Occupational History  . 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 Smoking status: Former Smoker -- 0.5 packs/day for 35 years    Types: Cigarettes  . Smokeless tobacco: Never Used     Comment: 01/06/2012 "quit smoking in 2008; slip and have one ocasionally still"  . Alcohol Use: 4.2 oz/week    7 Glasses of wine per week     Comment: 01/06/2012 "big bottle of wine at least 3 nights/wk; 6 pack pretty much q night"; 01/30/2012 "maybe a glass of wine q hs since left hospital last""  . Drug Use: No  . Sexually Active: No   Other Topics Concern  . 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, likes fishing.H/o profound social/family upheaval in WW2     BP 120/60  Pulse 78  Ht 5\' 8"  (1.727 m)  Wt 253 lb 1.9 oz (114.814 kg)  BMI 38.49 kg/m2  Physical Exam:  Well appearing 78 yo man, NAD HEENT: Unremarkable Neck:  7 cm JVD, no thyromegally Lungs:  Clear with no wheezes, rales, or rhonchi HEART:  Regular rate rhythm, no murmurs, no rubs, no clicks Abd:  soft, positive bowel sounds, no organomegally, no rebound, no guarding Ext:  2 plus pulses, no edema, no cyanosis, no clubbing Skin:  No rashes no nodules Neuro:  CN II through XII intact, motor grossly intact  DEVICE  Normal device function.  See PaceArt for details.   Assess/Plan:

## 2012-05-05 NOTE — Assessment & Plan Note (Signed)
His symptoms are currently class II. He'll continue his current medical therapy, and maintain a low-sodium diet. I've asked the patient to reduce his alcohol intake.

## 2012-05-06 ENCOUNTER — Ambulatory Visit (INDEPENDENT_AMBULATORY_CARE_PROVIDER_SITE_OTHER): Payer: Medicare Other | Admitting: Pulmonary Disease

## 2012-05-06 ENCOUNTER — Encounter: Payer: Self-pay | Admitting: Pulmonary Disease

## 2012-05-06 VITALS — BP 122/80 | HR 72 | Temp 98.3°F | Ht 68.0 in | Wt 250.8 lb

## 2012-05-06 DIAGNOSIS — R0609 Other forms of dyspnea: Secondary | ICD-10-CM

## 2012-05-06 DIAGNOSIS — G4733 Obstructive sleep apnea (adult) (pediatric): Secondary | ICD-10-CM | POA: Insufficient documentation

## 2012-05-06 DIAGNOSIS — R0683 Snoring: Secondary | ICD-10-CM

## 2012-05-06 NOTE — Patient Instructions (Signed)
Will arrange for CPAP set up at home Follow up in 8 weeks 

## 2012-05-06 NOTE — Progress Notes (Signed)
Chief Complaint  Patient presents with  . Sleep Consult    Referred By Dr Shirlee Latch. Snoring and difficulty breathing with sleep     History of Present Illness: [name] [name] is a 78 y.o. male former smoker for evaluation of sleep apnea.  He was recently evaluated by cardiology.  He was noted to have decreased energy and feeling of fatigue.  His wife reports he snores, and stops breathing while asleep.  He feels sleepy during the day. As a result he had a sleep study on 03/25/12, and this showed severe sleep apnea.  As a result he was referred to pulmonary/sleep medicine for further assessment.  He goes to bed at 11 pm.  He falls asleep quickly.  He wakes up at 930 am.  He feels tired in the morning.  He does not use anything to help him sleep or stay awake.  His Epworth score is 12 out of 24.  The patient denies sleep walking, sleep talking, bruxism, or nightmares.  There is no history of restless legs.  The patient denies sleep hallucinations, sleep paralysis, or cataplexy.   Tests: Echo 01/07/12 >> EF 20 to 25%, moderate LVH, mild MR PSG 03/25/12 >> AHI 31, SpO2 74%  Past Medical History  Diagnosis Date  . COPD (chronic obstructive pulmonary disease) 1993  . Hyperlipidemia 1993  . Hypertension 1993  . CAD (coronary artery disease)     bypass graft surgery 05/2006; grafts were widely patent on cath 06/2010   . Ischemic cardiomyopathy     EF 25-30%; s/p single chamber ICD 2010; upgrade to Medtronic BiV ICD 01/30/12  . Systolic heart failure     class II  . Foot fracture 1953; 1962    left; left  . Chronic kidney disease   . Myocardial infarction 2008; 01/06/2012  . Syncope and collapse 01/05/2012  . Shortness of breath 01/05/2012    "all the time; been going on for awhile"  . Diabetes mellitus, type 2 2000  . Arthritis     "hands" (01/06/2012)  . Elbow mass     left; "just noticed it ~ 2 wk ago"  (01/06/2012)  . Anxiety   . Nightmares     with psych eval 2012 at [facility]  . PTSD  (post-traumatic stress disorder)   . Tinnitus of left ear   . 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 Boil     "fluid boil left arm" (01/30/2012)  . LBBB (left bundle branch block)     Past Surgical History  Procedure Date  . Coronary artery bypass graft 2008    3 vessel, class 4 CHF, non Q wave MI 05/19/06  . Appendectomy 1957  . Cardiac defibrillator placement 01/30/2012    biventricular ICD  . Fracture surgery 1953; ~ 1962    left; left  . Cardiac defibrillator placement 01/30/2012    biventricular ICD  . Insert / replace / remove pacemaker 01/30/2012    biventricular ICD placed    Current Outpatient Prescriptions on File Prior to Visit  Medication Sig Dispense Refill  . aspirin 81 MG tablet Take 81 mg by mouth 2 (two) times daily.       . carvedilol (COREG) 25 MG tablet Take 37.5 mg by mouth 2 (two) times daily with a meal.      . Cinnamon 500 MG capsule Take 500 mg by mouth 2 (two) times daily. Take 2 tablets daily by mouth      . clopidogrel (PLAVIX) 75 MG tablet Take  75 mg by mouth daily.      . diazepam (VALIUM) 10 MG tablet Take 5 mg by mouth daily as needed. For anxiety & ringing in ears      . ergocalciferol (VITAMIN D2) 50000 UNITS capsule Take 50,000 Units by mouth every 30 (thirty) days.      . Flaxseed, Linseed, (FLAX SEEDS) POWD Take 5 mLs by mouth daily.      . insulin aspart (NOVOLOG) 100 UNIT/ML injection Inject 10-16 Units into the skin 3 (three) times daily before meals. Scale: sugar 100-150, 10 units. 150-200, 12 units. 200-250, 14 units. 250+, 16 units.      . insulin detemir (LEVEMIR) 100 UNIT/ML injection Inject 22 Units into the skin every morning. Add 1 unit every day until AM sugar is less than 150      . LIDODERM 5 % Place 1 patch onto the skin daily as needed. For pain      . lisinopril (PRINIVIL,ZESTRIL) 10 MG tablet Take 1 tablet (10 mg total) by mouth daily.  30 tablet  6  . metFORMIN (GLUCOPHAGE) 1000 MG tablet Take 1,000 mg by mouth 2 (two) times daily with a  meal.      . nitroGLYCERIN (NITROSTAT) 0.4 MG SL tablet Place 0.4 mg under the tongue every 5 (five) minutes as needed. For chest pain      . rosuvastatin (CRESTOR) 40 MG tablet Take 1 tablet (40 mg total) by mouth daily.  30 tablet  3  . spironolactone (ALDACTONE) 25 MG tablet Take 1 tablet (25 mg total) by mouth daily.  30 tablet  6  . torsemide (DEMADEX) 20 MG tablet Take 3 tablets (60 mg total) by mouth daily.  90 tablet  3    Allergies  Allergen Reactions  . Sulfa Antibiotics Swelling and Rash    Hands swell    Family History  Problem Relation Age of Onset  . Heart disease Father     MI age 69  . Diabetes Sister   . Hyperlipidemia Sister   . Cancer Maternal Grandmother     liver, cirrhosis  . Depression Neg Hx   . Alcohol abuse Neg Hx   . Drug abuse Neg Hx   . Stroke Neg Hx     History  Substance Use Topics  . Smoking status: Former Smoker -- 0.5 packs/day for 35 years    Types: Cigarettes  . Smokeless tobacco: Never Used     Comment: 01/06/2012 "quit smoking in 2008; slip and have one ocasionally still"  . Alcohol Use: 4.2 oz/week    7 Glasses of wine per week     Comment: 01/06/2012 "big bottle of wine at least 3 nights/wk; 6 pack pretty much q night"; 01/30/2012 "maybe a glass of wine q hs since left hospital last""     Physical Exam: There were no vitals filed for this visit.  Current Encounter SPO2  03/09/12 1402 98%  01/31/12 0927 98%  01/31/12 0500 97%  01/30/12 2100 93%  01/30/12 1523 95%  01/30/12 1218 96%  01/31/12 0927 98%  01/31/12 0500 97%  01/30/12 2100 93%  01/30/12 1523 95%  01/30/12 1218 96%    Wt Readings from Last 3 Encounters:  05/05/12 253 lb 1.9 oz (114.814 kg)  05/04/12 251 lb (113.853 kg)  03/25/12 250 lb (113.399 kg)    There is no height or weight on file to calculate BMI.   General - No distress ENT - No sinus tenderness, no oral exudate, MP  3, wears dentures, no LAN, no thyromegaly, TM clear, pupils  equal/reactive Cardiac - s1s2 regular, no murmur, pulses symmetric Chest - No wheeze/rales/dullness, good air entry, normal respiratory excursion Back - No focal tenderness Abd - Soft, non-tender, no organomegaly, + bowel sounds Ext - No edema Neuro - Normal strength, cranial nerves intact Skin - No rashes Psych - Normal mood, and behavior.   Dg Chest 2 View  01/31/2012  *RADIOLOGY REPORT*  Clinical Data: Status post ICD placement.  CHEST - 2 VIEW  Comparison: One-view chest 01/06/2012.  Findings: The AICD has been revised.  Additional pacemaker and leads have been placed in the right atrium and with great cardiac vein.  Heart remains enlarged.  There is no edema or effusion to suggest failure.  The lung volumes have improved.  Emphysematous changes are evident.  Degenerative changes are present within the thoracic spine.  IMPRESSION:  1.  Revision of pacemaker as described. 2.  No radiographic evidence of complication. 3.  Stable cardiomegaly without failure. 4.  Emphysema.   Original Report Authenticated By: Jamesetta Orleans. MATTERN, M.D.     Lab Results  Component Value Date   WBC 7.4 01/20/2012   HGB 14.2 01/20/2012   HCT 42.8 01/20/2012   MCV 92.3 01/20/2012   PLT 185.0 01/20/2012    Lab Results  Component Value Date   CREATININE 1.9* 03/08/2012   BUN 47* 03/08/2012   NA 137 03/08/2012   K 4.0 03/08/2012   CL 100 03/08/2012   CO2 26 03/08/2012    Lab Results  Component Value Date   ALT 28 04/27/2012   AST 27 04/27/2012   ALKPHOS 37* 04/27/2012   BILITOT 0.6 04/27/2012    Lab Results  Component Value Date   TSH 4.030 06/29/2010    BNP    Component Value Date/Time   PROBNP 209.0* 03/08/2012 1204      Assessment/Plan:  Coralyn Helling, MD [facility] Pager:  [phone] 05/06/2012, 3:53 PM

## 2012-05-06 NOTE — Assessment & Plan Note (Addendum)
He reports snoring, sleep disruption, daytime sleepiness, and witnessed apnea.  He has history of CAD, systolic CHF, HTN, DM, and COPD.  His sleep study shows severe sleep apnea.  I have reviewed his sleep test results with the patient.  Explained how sleep apnea can affect the patient's health.  Driving precautions and importance of weight loss were discussed.  Treatment options for sleep apnea were reviewed.  Will arrange for auto CPAP set up.

## 2012-05-12 ENCOUNTER — Other Ambulatory Visit: Payer: Medicare Other

## 2012-05-14 ENCOUNTER — Other Ambulatory Visit: Payer: Self-pay | Admitting: *Deleted

## 2012-05-14 NOTE — Telephone Encounter (Signed)
Faxed refill request. Please advise.  

## 2012-05-15 NOTE — Telephone Encounter (Signed)
Please verify that he is still using these.  Thanks.

## 2012-05-17 NOTE — Telephone Encounter (Signed)
LMOVM to return call.

## 2012-05-18 NOTE — Telephone Encounter (Signed)
I have not been able to reach [name] [name].  Returned fax to pharmacy asking them to have him call us.

## 2012-05-19 ENCOUNTER — Encounter: Payer: Self-pay | Admitting: Cardiology

## 2012-05-19 ENCOUNTER — Ambulatory Visit (INDEPENDENT_AMBULATORY_CARE_PROVIDER_SITE_OTHER): Payer: Medicare Other | Admitting: Cardiology

## 2012-05-19 VITALS — BP 124/74 | HR 73 | Ht 68.0 in | Wt 245.0 lb

## 2012-05-19 DIAGNOSIS — I25810 Atherosclerosis of coronary artery bypass graft(s) without angina pectoris: Secondary | ICD-10-CM

## 2012-05-19 DIAGNOSIS — I5022 Chronic systolic (congestive) heart failure: Secondary | ICD-10-CM

## 2012-05-19 DIAGNOSIS — N179 Acute kidney failure, unspecified: Secondary | ICD-10-CM

## 2012-05-19 DIAGNOSIS — N189 Chronic kidney disease, unspecified: Secondary | ICD-10-CM

## 2012-05-19 NOTE — Patient Instructions (Addendum)
Increase lisinopril to 15mg  daily. This will be 1 and 1/2 of your 10mg  tablets(total 15mg ) daily.   Your physician recommends that you return for lab work in: 2 weeks--BMET/BNP. The lab opens at 7:30am.   Your physician recommends that you schedule a follow-up appointment in: 3 months with Dr Shirlee Latch.

## 2012-05-20 NOTE — Progress Notes (Signed)
Patient ID: [name] [name], male   DOB: 1934/10/04, 78 y.o.   MRN: [id number] PCP: Dr. Para March  77 yo with history of CAD s/p CABG, ischemic cardiomyopathy/systolic CHF, LBBB, COPD, and CKD presents for cardiology followup.  He had CABG in 2008.  Last cath in 3/12 showed patent grafts.  EF is 20-25%.  He was admitted in 10/13 with acute on chronic systolic CHF.  He was volume overloaded. He had a mild TnI elevation with no chest pain.  He was diuresed and discharged.  After that, his ICD was upgraded to a Medtronic CRT-D system.    Since last appointment, he is symptomatically stable.  He is able to walk about 100 yards without exertional dyspnea.  He does tire easily.  He walks his dog.  Weight is down 8 lbs.  Main limitation at this point seems to be knee pain.  Creatinine has been stably elevated.  No chest pain, orthopnea, or PND.  He drinks ETOH only very rarely now.  He was diagnosed with severe sleep apnea but is waiting still for CPAP.   Labs (10/13): K 3.6 => 4.2, creatinine 1.79 => 2.1 => 2.0, HDL 45, LDL 157, BNP 219 Labs (12/13): K 4, creatinine 1.9, BNP 209 Labs (1/14): LDL 61, HDL 46, LFTs normal  PMH: 1. CAD: S/p CABG 2008.  Cath 3/12 with totally occluded mLAD and RCA, 80% LCx stenosis, patent LIMA-LAD, patent SVG-OM, 40% stenosis in SVG-PDA.   2. Ischemic cardiomyopathy: Echo (10/13) with severely dilated LV, moderate LVH, EF 20-25%, mild MR.  Medtronic CRT-D device 10/13.  3. LBBB 4. COPD 5. Type II diabetes 6. Hyperlipidemia 7. HTN 8. CKD 9. Obesity 10. OA: knees. 11. PTSD 12. H/o ETOH abuse 13. OSA: Severe by sleep study.   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   FH: Father with MI at 89  ROS: All systems reviewed and negative except as per HPI.   Current Outpatient Prescriptions  Medication Sig Dispense Refill  . aspirin 81 MG tablet Take 81 mg by mouth 2 (two) times daily.       . carvedilol (COREG) 25  MG tablet Take 37.5 mg by mouth 2 (two) times daily with a meal.      . Cinnamon 500 MG capsule Take 500 mg by mouth as needed.       . clopidogrel (PLAVIX) 75 MG tablet Take 75 mg by mouth daily.      . diazepam (VALIUM) 10 MG tablet Take 5 mg by mouth daily as needed. For anxiety & ringing in ears      . ergocalciferol (VITAMIN D2) 50000 UNITS capsule Take 50,000 Units by mouth every 30 (thirty) days.      . Flaxseed, Linseed, (FLAX SEEDS) POWD Take 5 mLs by mouth every other day.       . insulin aspart (NOVOLOG) 100 UNIT/ML injection Inject 10-16 Units into the skin 3 (three) times daily before meals. Scale: sugar 100-150, 10 units. 150-200, 12 units. 200-250, 14 units. 250+, 16 units.      . insulin detemir (LEVEMIR) 100 UNIT/ML injection Inject 22 Units into the skin every morning. Add 1 unit every day until AM sugar is less than 150      . LIDODERM 5 % Place 1 patch onto the skin daily as needed. For pain      . metFORMIN (GLUCOPHAGE) 1000 MG tablet Take 1,000 mg by mouth 2 (two) times daily with a meal.      .  nitroGLYCERIN (NITROSTAT) 0.4 MG SL tablet Place 0.4 mg under the tongue every 5 (five) minutes as needed. For chest pain      . rosuvastatin (CRESTOR) 40 MG tablet Take 1 tablet (40 mg total) by mouth daily.  30 tablet  3  . spironolactone (ALDACTONE) 25 MG tablet Take 1 tablet (25 mg total) by mouth daily.  30 tablet  6  . torsemide (DEMADEX) 20 MG tablet Take 3 tablets (60 mg total) by mouth daily.  90 tablet  3  . lisinopril (PRINIVIL,ZESTRIL) 10 MG tablet 1 and 1/2 tablets (total 15mg ) daily       No current facility-administered medications for this visit.    BP 124/74  Pulse 73  Ht 5\' 8"  (1.727 m)  Wt 245 lb (111.131 kg)  BMI 37.26 kg/m2 General: NAD, obese Neck: No JVD, no thyromegaly or thyroid nodule.  Lungs: Clear to auscultation bilaterally with normal respiratory effort. CV: Nondisplaced PMI.  Heart regular S1/S2, no S3/S4, no murmur.  Trace ankle edema.  No  carotid bruit.  Normal pedal pulses.  Abdomen: Soft, nontender, no hepatosplenomegaly, no distention.  Neurologic: Alert and oriented x 3.  Psych: Normal affect. Extremities: No clubbing or cyanosis.   Assessment/Plan: 1. Chronic systolic CHF: EF 16-10% by last echo.  Status post CRT-D upgrade. NYHA class II-III symptoms with main limitation from his knee pain.  He does not appear volume overloaded on exam.  - Continue current Coreg and spironolactone. - Increase lisinopril to 15 mg daily with BMET/BNP in 2 wks. - Continue current torsemide.  2. CAD: s/p CABG.  No recent chest pain.  Last cath in 3/12 with patent grafts.  He had a mild elevation in troponin during 10/13 admission with acute on chronic systolic CHF.  I think that this was most likely demand ischemia in the setting of significant volume overload.  We held off on cath given concern for demand ischemia and CKD.  - Continue ASA 81, statin, and Coreg.   3. Hyperlipidemia: Good lipids when checked recently on Crestor 40.  4. OSA: Severe OSA, awaiting CPAP.  We will send a message to pulmonary.  5. CKD: Stable creatinine when checked in 12/13.  As above, repeating in 2 wks.    Marca Ancona 05/20/2012

## 2012-05-26 ENCOUNTER — Other Ambulatory Visit: Payer: Self-pay | Admitting: *Deleted

## 2012-05-26 MED ORDER — LIDOCAINE 5 % EX PTCH: 1.0000 | MEDICATED_PATCH | Freq: Every day | CUTANEOUS | Status: DC | PRN

## 2012-05-26 NOTE — Telephone Encounter (Signed)
Ok to refill 

## 2012-05-26 NOTE — Telephone Encounter (Signed)
Sent!

## 2012-06-02 ENCOUNTER — Other Ambulatory Visit: Payer: Medicare Other

## 2012-06-04 ENCOUNTER — Other Ambulatory Visit (INDEPENDENT_AMBULATORY_CARE_PROVIDER_SITE_OTHER): Payer: Medicare Other

## 2012-06-04 DIAGNOSIS — I5022 Chronic systolic (congestive) heart failure: Secondary | ICD-10-CM

## 2012-06-04 LAB — BASIC METABOLIC PANEL
BUN: 33 mg/dL — ABNORMAL HIGH (ref 6–23)
Chloride: 100 mEq/L (ref 96–112)
Creatinine, Ser: 2.3 mg/dL — ABNORMAL HIGH (ref 0.4–1.5)

## 2012-06-07 ENCOUNTER — Other Ambulatory Visit: Payer: Self-pay | Admitting: *Deleted

## 2012-06-07 ENCOUNTER — Telehealth: Payer: Self-pay | Admitting: Cardiology

## 2012-06-07 ENCOUNTER — Telehealth: Payer: Self-pay | Admitting: Pulmonary Disease

## 2012-06-07 DIAGNOSIS — I5022 Chronic systolic (congestive) heart failure: Secondary | ICD-10-CM

## 2012-06-07 NOTE — Telephone Encounter (Signed)
Have left message in pulmonary for  Dr Sood/nurse to call about CPAP equipment.

## 2012-06-07 NOTE — Telephone Encounter (Signed)
New problem    Returning call back to nurse regarding blood work

## 2012-06-07 NOTE — Telephone Encounter (Signed)
Attempted to call pt at home number but no answer.  Will try back later.

## 2012-06-08 NOTE — Telephone Encounter (Signed)
Called and spoke with Harriett at [facility]. The order has been logged into their system. She will pull the order and investigate as to why the patient has not been set up yet. Have reprinted everything and will fax again to [facility]. [name] [name]

## 2012-06-08 NOTE — Telephone Encounter (Signed)
Spoke with patient and [facility] contacted patient and has scheduled an appointment for CPAP set up on Monday 06/14/12. Pt has a return appointment scheduled with Dr. Craige Cotta for 07/01/12 that will need to be r/s for a later date. Dr. Craige Cotta wanted to see patient for f/u about 8 wks after pt had been on CPAP. Pt advised that we will call him back tomorrow to r/s this follow up appointment with Dr. Craige Cotta.  Triage can you please contact patient to r/s f/u appointment in 7-8 wks? Rhonda J Cobb

## 2012-06-08 NOTE — Telephone Encounter (Signed)
Per order from Apr 26, 2012:  Note  Pt requested to use [facility] for New CPAP set up. Auto CPAP range 5-15 cm H20 with heated humidity and mask of choice.  Please send download after two weeks of use.  Contact patient on mobile number to arrange. Rhonda J Cobb  ----  Called, spoke with pt.  Pt states he hasn't heard anything from [facility] yet.  Pt states he hasn't been set up with the cpap yet and would like to know the status.  I apologized to pt for this and advised we have sent the order.  Advised I would send msg to [facility] to help figure out.  [facility], will you pls check on this?  Thank you.

## 2012-06-09 NOTE — Telephone Encounter (Signed)
lmomtcb for the pt to reschedule appt with [facility].  Pt has appt on 3/27 but will need to  Change this appt to 4/28.

## 2012-06-10 ENCOUNTER — Encounter: Payer: Self-pay | Admitting: Family Medicine

## 2012-06-10 NOTE — Telephone Encounter (Signed)
lmomtcb  

## 2012-06-14 NOTE — Telephone Encounter (Signed)
LMTCB X3 FOR PT

## 2012-06-15 NOTE — Telephone Encounter (Signed)
I spoke with pt. appt has been cancelled. Pt stated he did not go yesterday to get CPAP set up. He stated he will call us once he does get CPAP set up and make his appt. He needed nothing further

## 2012-06-18 ENCOUNTER — Telehealth: Payer: Self-pay | Admitting: Pulmonary Disease

## 2012-06-18 NOTE — Telephone Encounter (Signed)
Order re-sent to [facility] instead pt is aware [name] [name]

## 2012-06-21 ENCOUNTER — Other Ambulatory Visit: Payer: Medicare Other

## 2012-06-22 ENCOUNTER — Ambulatory Visit (INDEPENDENT_AMBULATORY_CARE_PROVIDER_SITE_OTHER): Payer: Medicare Other | Admitting: Cardiology

## 2012-06-22 ENCOUNTER — Encounter: Payer: Self-pay | Admitting: Cardiology

## 2012-06-22 ENCOUNTER — Encounter: Payer: Self-pay | Admitting: Internal Medicine

## 2012-06-22 ENCOUNTER — Other Ambulatory Visit (INDEPENDENT_AMBULATORY_CARE_PROVIDER_SITE_OTHER): Payer: Medicare Other

## 2012-06-22 VITALS — HR 65 | Ht 68.0 in | Wt 248.0 lb

## 2012-06-22 DIAGNOSIS — I5023 Acute on chronic systolic (congestive) heart failure: Secondary | ICD-10-CM

## 2012-06-22 DIAGNOSIS — I5022 Chronic systolic (congestive) heart failure: Secondary | ICD-10-CM

## 2012-06-22 DIAGNOSIS — Z95810 Presence of automatic (implantable) cardiac defibrillator: Secondary | ICD-10-CM

## 2012-06-22 DIAGNOSIS — I428 Other cardiomyopathies: Secondary | ICD-10-CM

## 2012-06-22 LAB — ICD DEVICE OBSERVATION
AL AMPLITUDE: 4.3 mv
AL IMPEDENCE ICD: 380 Ohm
AL THRESHOLD: 0.5 V
FVT: 0
LV LEAD IMPEDENCE ICD: 380 Ohm
LV LEAD THRESHOLD: 1 V
RV LEAD AMPLITUDE: 20 mv
RV LEAD IMPEDENCE ICD: 494 Ohm
RV LEAD THRESHOLD: 0.75 V
TZAT-0001ATACH: 2
TZAT-0001SLOWVT: 1
TZAT-0001SLOWVT: 2
TZAT-0002ATACH: NEGATIVE
TZAT-0002ATACH: NEGATIVE
TZAT-0005SLOWVT: 88 pct
TZAT-0011SLOWVT: 10 ms
TZAT-0011SLOWVT: 10 ms
TZAT-0012ATACH: 150 ms
TZAT-0012FASTVT: 170 ms
TZAT-0018ATACH: NEGATIVE
TZAT-0018FASTVT: NEGATIVE
TZAT-0019ATACH: 6 V
TZAT-0019ATACH: 6 V
TZAT-0019ATACH: 6 V
TZAT-0019FASTVT: 8 V
TZAT-0019SLOWVT: 8 V
TZAT-0019SLOWVT: 8 V
TZAT-0020ATACH: 1.5 ms
TZAT-0020ATACH: 1.5 ms
TZAT-0020FASTVT: 1.5 ms
TZON-0003VSLOWVT: 400 ms
TZON-0004VSLOWVT: 40
TZST-0001ATACH: 5
TZST-0001FASTVT: 5
TZST-0001SLOWVT: 3
TZST-0001SLOWVT: 4
TZST-0001SLOWVT: 5
TZST-0002ATACH: NEGATIVE
TZST-0002FASTVT: NEGATIVE
TZST-0002FASTVT: NEGATIVE
TZST-0003SLOWVT: 30 J
TZST-0003SLOWVT: 35 J

## 2012-06-22 LAB — BASIC METABOLIC PANEL
Calcium: 9.3 mg/dL (ref 8.4–10.5)
GFR: 34.15 mL/min — ABNORMAL LOW (ref >60.00)
Glucose, Bld: 260 mg/dL — ABNORMAL HIGH (ref 70–99)
Sodium: 134 mEq/L — ABNORMAL LOW (ref 135–145)

## 2012-06-22 NOTE — Progress Notes (Signed)
BiV ICD check/device clinic visit. Last seen by Dr. Ladona Ridgel January 2014. Normal BiV ICD function. See PaceArt report.

## 2012-06-25 ENCOUNTER — Telehealth: Payer: Self-pay | Admitting: Pulmonary Disease

## 2012-06-25 NOTE — Telephone Encounter (Signed)
I spoke with spouse. She stated pt is due for mask fitting at [facility] on Monday. Pt does not want [facility] to be upset if he does not go bc he needs to go out of town to go with spouse bc his sister in law needs to go for cancer tx. i advised him that [facility] would not be upset and can call to r/s mask fit once he returns to town. He voiced his understanding and needed nothing further

## 2012-07-01 ENCOUNTER — Ambulatory Visit: Payer: Medicare Other | Admitting: Pulmonary Disease

## 2012-07-29 ENCOUNTER — Other Ambulatory Visit: Payer: Self-pay | Admitting: *Deleted

## 2012-07-29 MED ORDER — DIAZEPAM 10 MG PO TABS: 5.0000 mg | ORAL_TABLET | Freq: Every day | ORAL | Status: DC | PRN

## 2012-07-29 NOTE — Telephone Encounter (Signed)
Rx phoned to pharmacy.  

## 2012-07-29 NOTE — Telephone Encounter (Signed)
Last filled 12/02/11

## 2012-07-29 NOTE — Telephone Encounter (Signed)
Please call in.  Thanks.   

## 2012-08-03 ENCOUNTER — Other Ambulatory Visit: Payer: Self-pay | Admitting: *Deleted

## 2012-08-03 ENCOUNTER — Encounter: Payer: Self-pay | Admitting: Family Medicine

## 2012-08-03 ENCOUNTER — Ambulatory Visit (INDEPENDENT_AMBULATORY_CARE_PROVIDER_SITE_OTHER): Payer: Medicare Other | Admitting: Family Medicine

## 2012-08-03 VITALS — BP 118/58 | HR 73 | Temp 98.0°F | Wt 245.2 lb

## 2012-08-03 DIAGNOSIS — E1165 Type 2 diabetes mellitus with hyperglycemia: Secondary | ICD-10-CM

## 2012-08-03 DIAGNOSIS — E119 Type 2 diabetes mellitus without complications: Secondary | ICD-10-CM

## 2012-08-03 LAB — HEMOGLOBIN A1C: Hgb A1c MFr Bld: 8.6 % — ABNORMAL HIGH (ref 4.6–6.5)

## 2012-08-03 MED ORDER — TORSEMIDE 20 MG PO TABS: 60.0000 mg | ORAL_TABLET | Freq: Every day | ORAL | Status: DC

## 2012-08-03 NOTE — Telephone Encounter (Signed)
Due to notes on file, pt is taking 3 tablets of torsemide. Fax Received. Refill Completed. [name] (R.M.A)

## 2012-08-03 NOTE — Patient Instructions (Addendum)
Call the eye clinic about getting your eyes checked.  Go to the lab on the way out.  We'll contact you with your lab report. Plan on a recheck in about 4 months.  Take care.

## 2012-08-03 NOTE — Progress Notes (Signed)
Diabetes:  Using medications without difficulties:yes Hypoglycemic episodes: no Hyperglycemic episodes: not per patient report. Feet problems: he has some occ tingling in the feet but normal exam foot today.  Blood Sugars averaging: 160 this AM, and that is typical for the patient.  A1c pending.   He is down about 5 lbs. He wants to lose a little more weight.   Last eye exam was ~1 year ago.  Encouraged f/u. He occ has some intermittent blurry vision in his R eye but not loss of vision.  This improves with sunglasses. Has been going on intermittently for about 3-4 months. No sx in L eye.    He is using CPAP per pulmonary now.  Has been on it about 1 week per patient report.    PMH and SH reviewed  Meds, vitals, and allergies reviewed.   ROS: See HPI.  Otherwise negative.    GEN: nad, alert and oriented HEENT: mucous membranes moist NECK: supple w/o LA CV: rrr. PULM: ctab, no inc wob ABD: soft, +bs EXT: no edema SKIN: no acute rash  Diabetic foot exam: Normal inspection No skin breakdown No calluses  Normal DP pulses Normal sensation to light touch and monofilament Nails normal

## 2012-08-04 NOTE — Assessment & Plan Note (Signed)
Not controlled but improved.  D/w pt about weight and diet. He'll work on both, no changes in meds.  Recheck later in 2014.  He agrees.  Labs d/w pt.

## 2012-08-05 ENCOUNTER — Encounter: Payer: Self-pay | Admitting: *Deleted

## 2012-08-05 ENCOUNTER — Other Ambulatory Visit: Payer: Self-pay | Admitting: *Deleted

## 2012-08-05 DIAGNOSIS — I5022 Chronic systolic (congestive) heart failure: Secondary | ICD-10-CM

## 2012-08-05 MED ORDER — LISINOPRIL 10 MG PO TABS: ORAL_TABLET | ORAL | Status: DC

## 2012-08-16 ENCOUNTER — Other Ambulatory Visit: Payer: Self-pay

## 2012-08-16 ENCOUNTER — Ambulatory Visit (INDEPENDENT_AMBULATORY_CARE_PROVIDER_SITE_OTHER): Payer: Medicare Other | Admitting: Cardiology

## 2012-08-16 ENCOUNTER — Encounter: Payer: Self-pay | Admitting: Cardiology

## 2012-08-16 VITALS — BP 132/70 | HR 65 | Ht 68.0 in | Wt 245.0 lb

## 2012-08-16 DIAGNOSIS — I10 Essential (primary) hypertension: Secondary | ICD-10-CM

## 2012-08-16 DIAGNOSIS — E785 Hyperlipidemia, unspecified: Secondary | ICD-10-CM

## 2012-08-16 DIAGNOSIS — I5022 Chronic systolic (congestive) heart failure: Secondary | ICD-10-CM

## 2012-08-16 DIAGNOSIS — N189 Chronic kidney disease, unspecified: Secondary | ICD-10-CM

## 2012-08-16 DIAGNOSIS — I739 Peripheral vascular disease, unspecified: Secondary | ICD-10-CM

## 2012-08-16 DIAGNOSIS — I25810 Atherosclerosis of coronary artery bypass graft(s) without angina pectoris: Secondary | ICD-10-CM

## 2012-08-16 DIAGNOSIS — G4733 Obstructive sleep apnea (adult) (pediatric): Secondary | ICD-10-CM

## 2012-08-16 DIAGNOSIS — I2510 Atherosclerotic heart disease of native coronary artery without angina pectoris: Secondary | ICD-10-CM

## 2012-08-16 LAB — BASIC METABOLIC PANEL WITH GFR
BUN: 62 mg/dL — ABNORMAL HIGH (ref 6–23)
CO2: 24 meq/L (ref 19–32)
Calcium: 9.6 mg/dL (ref 8.4–10.5)
Chloride: 101 meq/L (ref 96–112)
Creatinine, Ser: 3 mg/dL — ABNORMAL HIGH (ref 0.4–1.5)
GFR: 21.67 mL/min — ABNORMAL LOW (ref >60.00)
Glucose, Bld: 165 mg/dL — ABNORMAL HIGH (ref 70–99)
Potassium: 4.9 meq/L (ref 3.5–5.1)
Sodium: 136 meq/L (ref 135–145)

## 2012-08-16 MED ORDER — LISINOPRIL 5 MG PO TABS: 5.0000 mg | ORAL_TABLET | Freq: Every day | ORAL | Status: DC

## 2012-08-16 MED ORDER — LISINOPRIL 5 MG PO TABS: ORAL_TABLET | ORAL | Status: DC

## 2012-08-16 MED ORDER — TORSEMIDE 20 MG PO TABS: 40.0000 mg | ORAL_TABLET | Freq: Every day | ORAL | Status: DC

## 2012-08-16 MED ORDER — SPIRONOLACTONE 12.5 MG HALF TABLET: 12.5000 mg | ORAL_TABLET | Freq: Every day | ORAL | Status: DC

## 2012-08-16 NOTE — Patient Instructions (Addendum)
Your physician has requested that you have an ankle brachial index (ABI). During this test an ultrasound and blood pressure cuff are used to evaluate the arteries that supply the arms and legs with blood. Allow thirty minutes for this exam. There are no restrictions or special instructions.  Your physician recommends that you return for lab work in: today  Your physician wants you to follow-up in: 4 months. You will receive a reminder letter in the mail two months in advance. If you don't receive a letter, please call our office to schedule the follow-up appointment.

## 2012-08-17 ENCOUNTER — Telehealth: Payer: Self-pay | Admitting: Cardiology

## 2012-08-17 DIAGNOSIS — Z79899 Other long term (current) drug therapy: Secondary | ICD-10-CM

## 2012-08-17 DIAGNOSIS — I10 Essential (primary) hypertension: Secondary | ICD-10-CM

## 2012-08-17 DIAGNOSIS — I5022 Chronic systolic (congestive) heart failure: Secondary | ICD-10-CM

## 2012-08-17 NOTE — Telephone Encounter (Signed)
Follow Up     Pt calling in returning phone call in regards to a medication direction change. Would like to speak to nurse.

## 2012-08-17 NOTE — Telephone Encounter (Signed)
Reviewed medication changes with pt as documented on labs.  Pt states understanding and will repeat bloodwork in 1 week

## 2012-08-17 NOTE — Progress Notes (Signed)
Patient ID: [name] [name], male   DOB: Jan 09, 1935, 78 y.o.   MRN: [id number] PCP: Dr. Para March  78 yo with history of CAD s/p CABG, ischemic cardiomyopathy/systolic CHF, LBBB, COPD, and CKD presents for cardiology followup.  He had CABG in 2008.  Last cath in 3/12 showed patent grafts.  EF is 20-25%.  He was admitted in 10/13 with acute on chronic systolic CHF.  He was volume overloaded. He had a mild TnI elevation with no chest pain.  He was diuresed and discharged.  After that, his ICD was upgraded to a Medtronic CRT-D system.    Since last appointment, he is symptomatically stable.  Weight is stable.  He is able to walk about 100 yards without exertional dyspnea.  He does tire easily.  He walks his dog.  Main limitation at this point seems to be knee pain, but he also reports pain/fatigue in his calves bilaterally after walking about 100 yards.  No chest pain, orthopnea, or PND.  He drinks ETOH only very rarely now.  He was diagnosed with severe sleep apnea and is now on CPAP.  He thinks it helps.  Labs were done today.  Creatinine is higher than baseline at 3.0.   Labs (10/13): K 3.6 => 4.2, creatinine 1.79 => 2.1 => 2.0, HDL 45, LDL 157, BNP 219 Labs (12/13): K 4, creatinine 1.9, BNP 209 Labs (1/14): LDL 61, HDL 46, LFTs normal Labs (7/82): BNP 199 Labs (3/14): K 4.1, creatinine 2.0 Labs (5/14): K 4.9, creatinine 3.0  PMH: 1. CAD: S/p CABG 2008.  Cath 3/12 with totally occluded mLAD and RCA, 80% LCx stenosis, patent LIMA-LAD, patent SVG-OM, 40% stenosis in SVG-PDA.   2. Ischemic cardiomyopathy: Echo (10/13) with severely dilated LV, moderate LVH, EF 20-25%, mild MR.  Medtronic CRT-D device 10/13.  3. LBBB 4. COPD 5. Type II diabetes 6. Hyperlipidemia 7. HTN 8. CKD 9. Obesity 10. OA: knees. 11. PTSD 12. H/o ETOH abuse 13. OSA: Severe by sleep study, on CPAP.   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   FH: Father with MI at  43  ROS: All systems reviewed and negative except as per HPI.   Current Outpatient Prescriptions  Medication Sig Dispense Refill  . aspirin 81 MG tablet Take 81 mg by mouth 2 (two) times daily.       . carvedilol (COREG) 25 MG tablet Take 37.5 mg by mouth 2 (two) times daily with a meal.      . Cinnamon 500 MG capsule Take 500 mg by mouth as needed.       . clopidogrel (PLAVIX) 75 MG tablet Take 75 mg by mouth daily.      . diazepam (VALIUM) 10 MG tablet Take 0.5 tablets (5 mg total) by mouth daily as needed. For anxiety & ringing in ears  30 tablet  1  . ergocalciferol (VITAMIN D2) 50000 UNITS capsule Take 50,000 Units by mouth every 30 (thirty) days.      . Flaxseed, Linseed, (FLAX SEEDS) POWD Take 5 mLs by mouth every other day.       . insulin aspart (NOVOLOG) 100 UNIT/ML injection Inject 10-16 Units into the skin 3 (three) times daily before meals. Scale: sugar 100-150, 10 units. 150-200, 12 units. 200-250, 14 units. 250+, 16 units.      . insulin detemir (LEVEMIR) 100 UNIT/ML injection Inject 22 Units into the skin every morning. Add 1 unit every day until AM sugar is less  than 150      . lidocaine (LIDODERM) 5 % Place 1 patch onto the skin daily as needed. For pain  30 patch  1  . metFORMIN (GLUCOPHAGE) 1000 MG tablet Take 1,000 mg by mouth 2 (two) times daily with a meal.      . nitroGLYCERIN (NITROSTAT) 0.4 MG SL tablet Place 0.4 mg under the tongue every 5 (five) minutes as needed. For chest pain      . rosuvastatin (CRESTOR) 40 MG tablet Take 1 tablet (40 mg total) by mouth daily.  30 tablet  3  . lisinopril (PRINIVIL,ZESTRIL) 5 MG tablet Take 1 tablet (5 mg total) by mouth daily.  30 tablet  3  . spironolactone (ALDACTONE) 12.5 mg TABS Take 0.5 tablets (12.5 mg total) by mouth daily.  15 tablet  3  . torsemide (DEMADEX) 20 MG tablet Take 2 tablets (40 mg total) by mouth daily.  60 tablet  3   No current facility-administered medications for this visit.    BP 132/70  Pulse 65   Ht 5\' 8"  (1.727 m)  Wt 245 lb (111.131 kg)  BMI 37.26 kg/m2 General: NAD, obese Neck: Thick, no JVD, no thyromegaly or thyroid nodule.  Lungs: Clear to auscultation bilaterally with normal respiratory effort. CV: Nondisplaced PMI.  Heart regular S1/S2, no S3/S4, no murmur.  Trace ankle edema.  No carotid bruit.  Difficult to palpate PT pulses.   Abdomen: Soft, nontender, no hepatosplenomegaly, no distention.  Neurologic: Alert and oriented x 3.  Psych: Normal affect. Extremities: No clubbing or cyanosis.   Assessment/Plan: 1. Chronic systolic CHF: EF 53-66% by last echo.  Status post CRT-D upgrade. NYHA class II-III symptoms with main limitation from his knee and calf pain.  He does not appear volume overloaded on exam.  Creatinine was elevated to 3.0 when drawn today.  - Continue current Coreg. - Hold lisinopril, spironolactone, and torsemide x 2 days.  Restart lisinopril at 5 mg daily, spironolactone at 12.5 mg daily, and torsemide at 40 mg daily.  - BMET to be repeated in 1 week.  2. CAD: s/p CABG.  No recent chest pain.  Last cath in 3/12 with patent grafts.  He had a mild elevation in troponin during 10/13 admission with acute on chronic systolic CHF.  I think that this was most likely demand ischemia in the setting of significant volume overload.  We held off on cath given concern for demand ischemia and CKD.  - Continue ASA 81, Plavix, statin, and Coreg.   3. Hyperlipidemia: Continue statin.   4. OSA: Severe OSA, on CPAP.  5. CKD: Creatinine up to 3, medication changes as above.  6. Leg pain: Difficult to feel pedal pulses.  ? Claudication.  Will get ABIs.      Marca Ancona 08/17/2012

## 2012-08-18 ENCOUNTER — Encounter (INDEPENDENT_AMBULATORY_CARE_PROVIDER_SITE_OTHER): Payer: Medicare Other

## 2012-08-18 DIAGNOSIS — I70219 Atherosclerosis of native arteries of extremities with intermittent claudication, unspecified extremity: Secondary | ICD-10-CM

## 2012-08-18 DIAGNOSIS — I739 Peripheral vascular disease, unspecified: Secondary | ICD-10-CM

## 2012-08-24 ENCOUNTER — Other Ambulatory Visit (INDEPENDENT_AMBULATORY_CARE_PROVIDER_SITE_OTHER): Payer: Medicare Other

## 2012-08-24 DIAGNOSIS — I10 Essential (primary) hypertension: Secondary | ICD-10-CM

## 2012-08-24 LAB — BASIC METABOLIC PANEL
BUN: 33 mg/dL — ABNORMAL HIGH (ref 6–23)
CO2: 26 mEq/L (ref 19–32)
Calcium: 9.1 mg/dL (ref 8.4–10.5)
Chloride: 102 mEq/L (ref 96–112)
Creatinine, Ser: 2.5 mg/dL — ABNORMAL HIGH (ref 0.4–1.5)
GFR: 26.84 mL/min — ABNORMAL LOW (ref >60.00)
Glucose, Bld: 182 mg/dL — ABNORMAL HIGH (ref 70–99)
Potassium: 4.4 mEq/L (ref 3.5–5.1)
Sodium: 138 mEq/L (ref 135–145)

## 2012-08-27 ENCOUNTER — Other Ambulatory Visit: Payer: Self-pay | Admitting: *Deleted

## 2012-08-27 DIAGNOSIS — I739 Peripheral vascular disease, unspecified: Secondary | ICD-10-CM

## 2012-09-07 ENCOUNTER — Telehealth: Payer: Self-pay | Admitting: Pulmonary Disease

## 2012-09-07 NOTE — Telephone Encounter (Signed)
Auto CPAP 07/18/12 to 08/16/12 >> Used on 30 of 30 nights with average 6 hrs 23 min.  Average AHI 2.7 with median CPAP 11 cm H2O and 95 th percentile CPAP 14 cm H2O.  Will have my nurse schedule ROV to review status of sleep apnea.

## 2012-09-09 NOTE — Telephone Encounter (Signed)
Spoke to pt. Appointment has been made for 10/13/12 @ 3:15pm. Nothing further was needed.

## 2012-09-09 NOTE — Telephone Encounter (Signed)
Pt returned call. [name] [name] °

## 2012-09-09 NOTE — Telephone Encounter (Signed)
LMTCB

## 2012-09-14 ENCOUNTER — Encounter: Payer: Self-pay | Admitting: Cardiovascular Disease

## 2012-09-14 ENCOUNTER — Ambulatory Visit (INDEPENDENT_AMBULATORY_CARE_PROVIDER_SITE_OTHER): Payer: Medicare Other | Admitting: Cardiovascular Disease

## 2012-09-14 VITALS — BP 138/60 | HR 72 | Ht 68.0 in | Wt 247.8 lb

## 2012-09-14 DIAGNOSIS — I739 Peripheral vascular disease, unspecified: Secondary | ICD-10-CM

## 2012-09-14 MED ORDER — NITROGLYCERIN 0.4 MG SL SUBL: 0.4000 mg | SUBLINGUAL_TABLET | SUBLINGUAL | Status: AC | PRN

## 2012-09-14 NOTE — Assessment & Plan Note (Signed)
The patient has moderate bilateral calf claudication due to underlying peripheral arterial disease. His ABI was mildly reduced bilaterally at 0.75 on the right side and 0.80 on the left side. He has evidence of bilateral diffuse SFA disease but does not seem to be critical. I discussed the natural history and management of peripheral arterial disease. Most nonsmoking patients continue to have stable claudication upon followup. However, he is diabetic and has multiple risk factors for atherosclerosis. He is already on good medical therapy. Due to his advanced chronic kidney disease, Any angiography that utilizes IV contrast would put him at significant risk for contrast-induced nephropathy and progression to end-stage renal disease. Also due to his heart failure with reduced LV systolic function, cilostazol is contraindicated. I recommend continuing medical therapy and observation. I had a prolonged discussion with him about the importance of a walking exercise program to improve claudication distance. He currently has no rest pain and no evidence of lower extremity ulceration.  I will have him followup with me in 6 months from now.

## 2012-09-14 NOTE — Patient Instructions (Addendum)
Your physician wants you to follow-up in: 6 MONTHS IN THE [facility]. You will receive a reminder letter in the mail two months in advance. If you don't receive a letter, please call our office to schedule the follow-up appointment.  Your physician recommends that you continue on your current medications as directed. Please refer to the Current Medication list given to you today.

## 2012-09-14 NOTE — Progress Notes (Signed)
Primary-care physician: Dr. Para March Primary cardiologist: Dr. Shirlee Latch   HPI  This is a 78 yo male who was referred by Dr. Shirlee Latch for evaluation of peripheral arterial disease. He has known history of CAD s/p CABG, ischemic cardiomyopathy/systolic CHF, LBBB, diabetes mellitus, COPD, and CKD. He had CABG in 2008. Last cath in 3/12 showed patent grafts. EF is 20-25%.  He is status post ICD placement which was upgraded to a Medtronic CRT-D system.  He reports exertional dyspnea and fatigue after walking about 100 yards. This has also been associated with bilateral calf discomfort which forces him to stop for a few minutes before he can resume. There is no rest pain or ulceration. He has chronic kidney disease with creatinine ranging from 2.5-3 with an estimated GFR of 25.   Allergies  Allergen Reactions  . Sulfa Antibiotics Swelling and Rash    Hands swell     Current Outpatient Prescriptions on File Prior to Visit  Medication Sig Dispense Refill  . aspirin 81 MG tablet Take 81 mg by mouth 2 (two) times daily.       . carvedilol (COREG) 25 MG tablet Take 37.5 mg by mouth 2 (two) times daily with a meal.      . Cinnamon 500 MG capsule Take 500 mg by mouth as needed.       . clopidogrel (PLAVIX) 75 MG tablet Take 75 mg by mouth daily.      . diazepam (VALIUM) 10 MG tablet Take 0.5 tablets (5 mg total) by mouth daily as needed. For anxiety & ringing in ears  30 tablet  1  . Flaxseed, Linseed, (FLAX SEEDS) POWD Take 5 mLs by mouth every other day.       . insulin aspart (NOVOLOG) 100 UNIT/ML injection Inject 10-16 Units into the skin 3 (three) times daily before meals. Scale: sugar 100-150, 10 units. 150-200, 12 units. 200-250, 14 units. 250+, 16 units.      . insulin detemir (LEVEMIR) 100 UNIT/ML injection Inject 22 Units into the skin every morning. Add 1 unit every day until AM sugar is less than 150      . lidocaine (LIDODERM) 5 % Place 1 patch onto the skin daily as needed. For pain  30  patch  1  . lisinopril (PRINIVIL,ZESTRIL) 5 MG tablet Take 1 tablet (5 mg total) by mouth daily.  30 tablet  3  . metFORMIN (GLUCOPHAGE) 1000 MG tablet Take 1,000 mg by mouth 2 (two) times daily with a meal.      . nitroGLYCERIN (NITROSTAT) 0.4 MG SL tablet Place 0.4 mg under the tongue every 5 (five) minutes as needed. For chest pain      . rosuvastatin (CRESTOR) 40 MG tablet Take 1 tablet (40 mg total) by mouth daily.  30 tablet  3  . spironolactone (ALDACTONE) 12.5 mg TABS Take 0.5 tablets (12.5 mg total) by mouth daily.  15 tablet  3  . torsemide (DEMADEX) 20 MG tablet Take 2 tablets (40 mg total) by mouth daily.  60 tablet  3   No current facility-administered medications on file prior to visit.     Past Medical History  Diagnosis Date  . COPD (chronic obstructive pulmonary disease) 1993  . Hyperlipidemia 1993  . Hypertension 1993  . CAD (coronary artery disease)     bypass graft surgery 05/2006; grafts were widely patent on cath 06/2010   . Ischemic cardiomyopathy     EF 25-30%; s/p single chamber ICD 2010; upgrade to  Medtronic BiV ICD 01/30/12  . Systolic heart failure     class II  . Foot fracture 1953; 1962    left; left  . Chronic kidney disease   . Myocardial infarction 2008; 01/06/2012  . Syncope and collapse 01/05/2012  . Shortness of breath 01/05/2012    "all the time; been going on for awhile"  . Diabetes mellitus, type 2 2000  . Arthritis     "hands" (01/06/2012)  . Elbow mass     left; "just noticed it ~ 2 wk ago"  (01/06/2012)  . Anxiety   . Nightmares     with psych eval 2012 at [facility]  . PTSD (post-traumatic stress disorder)   . Tinnitus of left ear   . 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 Boil     "fluid boil left arm" (01/30/2012)  . LBBB (left bundle branch block)      Past Surgical History  Procedure Laterality Date  . Coronary artery bypass graft  2008    3 vessel, class 4 CHF, non Q wave MI 05/19/06  . Appendectomy  1957  . Cardiac defibrillator placement   01/30/2012    biventricular ICD  . Fracture surgery  1953; ~ 1962    left; left  . Cardiac defibrillator placement  01/30/2012    biventricular ICD  . Insert / replace / remove pacemaker  01/30/2012    biventricular ICD placed     Family History  Problem Relation Age of Onset  . Heart disease Father     MI age 53  . Diabetes Sister   . Hyperlipidemia Sister   . Cancer Maternal Grandmother     liver, cirrhosis  . Depression Neg Hx   . Alcohol abuse Neg Hx   . Drug abuse Neg Hx   . Stroke Neg Hx      History   Social History  . Marital Status: Married    Spouse Name: N/A    Number of Children: 0  . Years of Education: N/A   Occupational History  . 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 Smoking status: Former Smoker -- 0.50 packs/day for 35 years    Types: Cigarettes  . Smokeless tobacco: Never Used     Comment: 01/06/2012 "quit smoking in 2008; slip and have one ocasionally still"  . Alcohol Use: 4.2 oz/week    7 Glasses of wine per week     Comment: 01/06/2012 "big bottle of wine at least 3 nights/wk; 6 pack pretty much q night"; 01/30/2012 "maybe a glass of wine q hs since left hospital last""  . Drug Use: No  . Sexually Active: No   Other Topics Concern  . Not on file   Social History Narrative   Second marriage.   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   Likes to build Brewing technologist, likes fishing.   H/o profound social/family upheaval in WW2      PHYSICAL EXAM   BP 138/60  Pulse 72  Ht 5\' 8"  (1.727 m)  Wt 247 lb 12.8 oz (112.401 kg)  BMI 37.69 kg/m2 Constitutional: He is oriented to person, place, and time. He appears well-developed and well-nourished. No distress.  HENT: No nasal discharge.  Head: Normocephalic and atraumatic.  Eyes: Pupils are equal and round.  Neck: Normal range of motion. Neck supple. No JVD present. No thyromegaly present. No carotid bruits Cardiovascular: Normal rate, regular rhythm,  normal heart sounds and.  Exam reveals no gallop and no friction rub. No murmur heard.  Pulmonary/Chest: Effort normal and breath sounds normal. No stridor. No respiratory distress. He has no wheezes. He has no rales. He exhibits no tenderness.  Abdominal: Soft. Bowel sounds are normal. He exhibits no distension. There is no tenderness. There is no rebound and no guarding.  Musculoskeletal: Normal range of motion. He exhibits trace edema and no tenderness.  Neurological: He is alert and oriented to person, place, and time. Coordination normal.  Skin: Skin is warm and dry. No rash noted. He is not diaphoretic. No erythema. No pallor.  Psychiatric: He has a normal mood and affect. His behavior is normal. Judgment and thought content normal.  Femoral pulses are normal bilaterally. Distal pulses are not palpable.      ASSESSMENT AND PLAN

## 2012-09-17 ENCOUNTER — Encounter: Payer: Self-pay | Admitting: Family Medicine

## 2012-09-17 ENCOUNTER — Telehealth: Payer: Self-pay

## 2012-09-17 NOTE — Telephone Encounter (Signed)
Pt left v/m:pt ordered diabetic test strips from [facility], [facility] faxed request to Dr Para March and pt request to send form back to [facility] ASAP. Pt request call back.

## 2012-09-17 NOTE — Telephone Encounter (Signed)
Form done 

## 2012-09-20 NOTE — Telephone Encounter (Signed)
Form faxed and scanned.

## 2012-10-13 ENCOUNTER — Ambulatory Visit (INDEPENDENT_AMBULATORY_CARE_PROVIDER_SITE_OTHER): Payer: Medicare Other | Admitting: Pulmonary Disease

## 2012-10-13 ENCOUNTER — Encounter: Payer: Self-pay | Admitting: Pulmonary Disease

## 2012-10-13 VITALS — BP 118/80 | HR 87 | Ht 68.0 in | Wt 240.0 lb

## 2012-10-13 DIAGNOSIS — G4733 Obstructive sleep apnea (adult) (pediatric): Secondary | ICD-10-CM

## 2012-10-13 NOTE — Assessment & Plan Note (Signed)
He is compliant with therapy and reports benefit from CPAP.  Advised him to d/w his DME if he has trouble with mask fit.

## 2012-10-13 NOTE — Patient Instructions (Signed)
Follow up in 1 year.

## 2012-10-13 NOTE — Progress Notes (Signed)
Chief Complaint  Patient presents with  . Follow-up    Currently using CPAP machine almost every night. Denies problems with the machine, mask or pressure.    History of Present Illness: [name] [name] is a 78 y.o. male with OSA.  He has been sleeping better since using CPAP.  He has a full face mask.  This sometimes shifts, and leaks >> not as much of a problem now.  He feels more energy during the day.  He has been working on his weight, and has lost about 13 lbs since January.   Wt Readings from Last 10 Encounters:  10/13/12 240 lb (108.863 kg)  09/14/12 247 lb 12.8 oz (112.401 kg)  08/16/12 245 lb (111.131 kg)  08/03/12 245 lb 4 oz (111.245 kg)  06/22/12 248 lb (112.492 kg)  05/19/12 245 lb (111.131 kg)  05/06/12 250 lb 12.8 oz (113.762 kg)  05/05/12 253 lb 1.9 oz (114.814 kg)  05/04/12 251 lb (113.853 kg)  03/25/12 250 lb (113.399 kg)     TESTS: Echo 01/07/12 >> EF 20 to 25%, moderate LVH, mild MR PSG 03/25/12 >> AHI 31, SpO2 74% Auto CPAP 07/18/12 to 08/16/12 >> Used on 30 of 30 nights with average 6 hrs 23 min.  Average AHI 2.7 with median CPAP 11 cm H2O and 95 th percentile CPAP 14 cm H2O.  [name] [name]  has a past medical history of COPD (chronic obstructive pulmonary disease) (1993); Hyperlipidemia (1993); Hypertension (1993); CAD (coronary artery disease); Ischemic cardiomyopathy; Systolic heart failure; Foot fracture (1953; 1962); Chronic kidney disease; Myocardial infarction (2008; 01/06/2012); Syncope and collapse (01/05/2012); Shortness of breath (01/05/2012); Diabetes mellitus, type 2 (2000); Arthritis; Elbow mass; Anxiety; Nightmares; PTSD (post-traumatic stress disorder); Tinnitus of left ear; Deafness in left ear; Boil; and LBBB (left bundle branch block).  [name] [name]  has past surgical history that includes Coronary artery bypass graft (2008); Appendectomy (1957); Cardiac defibrillator placement (01/30/2012); Fracture surgery (1610; ~ 1962); Cardiac defibrillator  placement (01/30/2012); and Insert / replace / remove pacemaker (01/30/2012).  Prior to Admission medications   Medication Sig Start Date End Date Taking? Authorizing Provider  aspirin 81 MG tablet Take 81 mg by mouth 2 (two) times daily.    Yes Historical Provider, MD  carvedilol (COREG) 25 MG tablet Take 37.5 mg by mouth 2 (two) times daily with a meal. 11/12/11  Yes Joaquim Nam, MD  Cinnamon 500 MG capsule Take 500 mg by mouth as needed.    Yes Historical Provider, MD  clopidogrel (PLAVIX) 75 MG tablet Take 75 mg by mouth daily.   Yes Historical Provider, MD  diazepam (VALIUM) 10 MG tablet Take 0.5 tablets (5 mg total) by mouth daily as needed. For anxiety & ringing in ears 07/29/12  Yes Joaquim Nam, MD  Flaxseed, Linseed, (FLAX SEEDS) POWD Take 5 mLs by mouth every other day.    Yes Historical Provider, MD  insulin aspart (NOVOLOG) 100 UNIT/ML injection Inject 10-16 Units into the skin 3 (three) times daily before meals. Scale: sugar 100-150, 10 units. 150-200, 12 units. 200-250, 14 units. 250+, 16 units. 12/09/11  Yes Joaquim Nam, MD  insulin detemir (LEVEMIR) 100 UNIT/ML injection Inject 22 Units into the skin every morning. Add 1 unit every day until AM sugar is less than 150 01/26/12  Yes Joaquim Nam, MD  lidocaine (LIDODERM) 5 % Place 1 patch onto the skin daily as needed. For pain 05/26/12  Yes Joaquim Nam, MD  Liraglutide (VICTOZA) 18 MG/3ML  SOPN Inject into the skin.   Yes Historical Provider, MD  lisinopril (PRINIVIL,ZESTRIL) 5 MG tablet Take 1 tablet (5 mg total) by mouth daily. 08/16/12  Yes Laurey Morale, MD  nitroGLYCERIN (NITROSTAT) 0.4 MG SL tablet Place 1 tablet (0.4 mg total) under the tongue every 5 (five) minutes as needed. For chest pain 09/14/12  Yes Iran Ouch, MD  rosuvastatin (CRESTOR) 40 MG tablet Take 1 tablet (40 mg total) by mouth daily. 03/09/12  Yes Laurey Morale, MD  spironolactone (ALDACTONE) 12.5 mg TABS Take 0.5 tablets (12.5 mg total) by  mouth daily. 08/16/12  Yes Laurey Morale, MD  torsemide (DEMADEX) 20 MG tablet Take 2 tablets (40 mg total) by mouth daily. 08/16/12  Yes Laurey Morale, MD    Allergies  Allergen Reactions  . Sulfa Antibiotics Swelling and Rash    Hands swell     Physical Exam:  General - No distress ENT - No sinus tenderness, MP 3, wears dentures, no oral exudate, no LAN Cardiac - s1s2 regular, no murmur Chest - No wheeze/rales/dullness Back - No focal tenderness Abd - Soft, non-tender Ext - No edema Neuro - Normal strength Skin - No rashes Psych - normal mood, and behavior   Assessment/Plan:  Coralyn Helling, MD [place] [facility] Pager:  [phone]

## 2012-10-14 ENCOUNTER — Ambulatory Visit (INDEPENDENT_AMBULATORY_CARE_PROVIDER_SITE_OTHER): Payer: Medicare Other | Admitting: *Deleted

## 2012-10-14 ENCOUNTER — Encounter: Payer: Self-pay | Admitting: Internal Medicine

## 2012-10-14 DIAGNOSIS — I509 Heart failure, unspecified: Secondary | ICD-10-CM

## 2012-10-14 DIAGNOSIS — I428 Other cardiomyopathies: Secondary | ICD-10-CM

## 2012-10-14 DIAGNOSIS — I5023 Acute on chronic systolic (congestive) heart failure: Secondary | ICD-10-CM

## 2012-10-14 DIAGNOSIS — I2589 Other forms of chronic ischemic heart disease: Secondary | ICD-10-CM

## 2012-10-14 LAB — ICD DEVICE OBSERVATION
AL AMPLITUDE: 3 mv
ATRIAL PACING ICD: 0.37 pct
BATTERY VOLTAGE: 3.1551 V
FVT: 0
LV LEAD IMPEDENCE ICD: 380 Ohm
RV LEAD AMPLITUDE: 21.25 mv
RV LEAD IMPEDENCE ICD: 551 Ohm
RV LEAD THRESHOLD: 0.5 V
TOT-0006: 20131025000000
TZAT-0001ATACH: 3
TZAT-0001SLOWVT: 1
TZAT-0001SLOWVT: 2
TZAT-0002ATACH: NEGATIVE
TZAT-0002ATACH: NEGATIVE
TZAT-0002ATACH: NEGATIVE
TZAT-0002FASTVT: NEGATIVE
TZAT-0012FASTVT: 170 ms
TZAT-0018ATACH: NEGATIVE
TZAT-0018SLOWVT: NEGATIVE
TZAT-0018SLOWVT: NEGATIVE
TZAT-0019ATACH: 6 V
TZAT-0019ATACH: 6 V
TZAT-0019ATACH: 6 V
TZAT-0019FASTVT: 8 V
TZAT-0019SLOWVT: 8 V
TZAT-0019SLOWVT: 8 V
TZAT-0020ATACH: 1.5 ms
TZAT-0020SLOWVT: 1.5 ms
TZAT-0020SLOWVT: 1.5 ms
TZON-0005SLOWVT: 12
TZST-0001ATACH: 5
TZST-0001ATACH: 6
TZST-0001FASTVT: 3
TZST-0001FASTVT: 5
TZST-0001SLOWVT: 3
TZST-0001SLOWVT: 4
TZST-0002ATACH: NEGATIVE
TZST-0002ATACH: NEGATIVE
TZST-0002FASTVT: NEGATIVE
TZST-0002FASTVT: NEGATIVE
TZST-0002FASTVT: NEGATIVE
TZST-0002FASTVT: NEGATIVE
TZST-0003SLOWVT: 35 J
VF: 0

## 2012-10-14 NOTE — Progress Notes (Signed)
ICD check with ICM in office. 

## 2012-10-18 ENCOUNTER — Telehealth: Payer: Self-pay | Admitting: Endocrinology

## 2012-10-19 NOTE — Telephone Encounter (Signed)
No answer when returning pt's call

## 2012-10-22 ENCOUNTER — Other Ambulatory Visit: Payer: Self-pay | Admitting: Family Medicine

## 2012-10-25 NOTE — Telephone Encounter (Signed)
About 4-weeks.

## 2012-10-25 NOTE — Telephone Encounter (Signed)
Sherri, could you please call this patient and schedule his appt in 4 weeks please.  Thanks

## 2012-10-25 NOTE — Telephone Encounter (Signed)
Pt wants to know when he's suppose to follow up with you, he last saw you on June 18th.

## 2012-11-20 ENCOUNTER — Other Ambulatory Visit: Payer: Self-pay | Admitting: Family Medicine

## 2012-12-01 ENCOUNTER — Encounter: Payer: Self-pay | Admitting: Endocrinology

## 2012-12-01 ENCOUNTER — Ambulatory Visit (INDEPENDENT_AMBULATORY_CARE_PROVIDER_SITE_OTHER): Payer: Medicare Other | Admitting: Endocrinology

## 2012-12-01 VITALS — BP 132/76 | HR 88 | Temp 98.6°F | Resp 10 | Ht 68.0 in | Wt 237.2 lb

## 2012-12-01 DIAGNOSIS — IMO0001 Reserved for inherently not codable concepts without codable children: Secondary | ICD-10-CM

## 2012-12-01 DIAGNOSIS — E785 Hyperlipidemia, unspecified: Secondary | ICD-10-CM

## 2012-12-01 DIAGNOSIS — E1165 Type 2 diabetes mellitus with hyperglycemia: Secondary | ICD-10-CM

## 2012-12-01 LAB — GLUCOSE, RANDOM: Glucose, Bld: 169 mg/dL — ABNORMAL HIGH (ref 70–99)

## 2012-12-01 NOTE — Patient Instructions (Addendum)
Novolog 6 units at lunch if afternoon sugar over 170   Please check blood sugars at least half the time about 2 hours after any meal and as directed on waking up. Please bring blood sugar monitor to each visit

## 2012-12-01 NOTE — Progress Notes (Signed)
Patient ID: [name] [name], male   DOB: 07-28-1934, 78 y.o.   MRN: [id number]  [name] [name] is an 78 y.o. male.   Reason for Appointment: Diabetes follow-up   History of Present Illness   Diagnosis: Type 2 DIABETES MELITUS, date of diagnosis: 2002   He has been on insulin since about 2008 with variable control Has not been seen in followup regularly and started regular followup only earlier this year when his blood sugar was poorly controlled In January his A1c was 10.1% He has had difficulty affording his medications and does not take his insulin doses regularly   On his last visit he was given Victoza again to try since this had previously helped and overall blood sugars appear to be better. Also was given specific instructions on insulin and the dose was adjusted with improved control He is supposed to be taking his NovoLog insulin with every meal but he does not adjust the dose of mealtime insulin based on meal size Generally eating smaller lunch and has stopped taking the lunchtime coverage because he tends to get low in the afternoon     Oral hypoglycemic drugs: None, has limitations because of CRF        Side effects from medications: None Insulin regimen: Levemir 25 units daily, NovoLog usually 16 units before meals       Proper timing of medications in relation to meals: Yes.         Monitors blood glucose: Once a day.    Glucometer: generic           Blood Glucose readings from meter review: readings are mostly before breakfast:  63 134 197 [id number] 192 133 162 135; 30 day average = 162 Hypoglycemia frequency:  none recently.          Meals: 3 meals per day.          Physical activity: exercise: Walking about 20 min           Dietician visit: Most recent: 2010           Complications: are: ? Nephropathy     Lab Results  Component Value Date   HGBA1C 7.0* 12/02/2012     Wt Readings from Last 3 Encounters:  12/07/12 239 lb (108.41 kg)  12/01/12 237 lb 3.2 oz (107.593 kg)   10/13/12 240 lb (108.863 kg)      Medication List       This list is accurate as of: 12/01/12 11:59 PM.  Always use your most recent med list.               aspirin 81 MG tablet  Take 81 mg by mouth 2 (two) times daily.     carvedilol 25 MG tablet  Commonly known as:  COREG  Take 37.5 mg by mouth 2 (two) times daily with a meal.     Cinnamon 500 MG capsule  Take 500 mg by mouth as needed.     clopidogrel 75 MG tablet  Commonly known as:  PLAVIX  Take 75 mg by mouth daily.     diazepam 10 MG tablet  Commonly known as:  VALIUM  Take 0.5 tablets (5 mg total) by mouth daily as needed. For anxiety & ringing in ears     Flax Seeds Powd  Take 5 mLs by mouth every other day.     insulin aspart 100 UNIT/ML injection  Commonly known as:  novoLOG  Inject 10-16 Units into the skin 3 (three)  times daily before meals. Scale: sugar 100-150, 10 units. 150-200, 12 units. 200-250, 14 units. 250+, 16 units. Pt uses pen     NOVOLOG FLEXPEN 100 UNIT/ML Sopn FlexPen  Generic drug:  insulin aspart  USE AS DIRECTED PER SLIDING SCALE. SUGAR100-150=10UNITS, 150-200=12UNITS, 200-250=14UNITS, 250+=16UNITS. USE 3 TIMES DAILY     insulin detemir 100 UNIT/ML injection  Commonly known as:  LEVEMIR  Inject 25 Units into the skin every morning. Add 1 unit every day until AM sugar is less than 150     lidocaine 5 %  Commonly known as:  LIDODERM  Place 1 patch onto the skin daily as needed. For pain     lisinopril 5 MG tablet  Commonly known as:  PRINIVIL,ZESTRIL  Take 5 mg by mouth daily.     nitroGLYCERIN 0.4 MG SL tablet  Commonly known as:  NITROSTAT  Place 1 tablet (0.4 mg total) under the tongue every 5 (five) minutes as needed. For chest pain     rosuvastatin 40 MG tablet  Commonly known as:  CRESTOR  Take 1 tablet (40 mg total) by mouth daily.     torsemide 20 MG tablet  Commonly known as:  DEMADEX  Take 2 tablets (40 mg total) by mouth daily.     VICTOZA 18 MG/3ML Sopn   Generic drug:  Liraglutide  Inject 1.2 mg into the skin.        Allergies:  Allergies  Allergen Reactions  . Sulfa Antibiotics Swelling and Rash    Hands swell    Past Medical History  Diagnosis Date  . COPD (chronic obstructive pulmonary disease) 1993  . Hyperlipidemia 1993  . Hypertension 1993  . CAD (coronary artery disease)     bypass graft surgery 05/2006; grafts were widely patent on cath 06/2010   . Ischemic cardiomyopathy     EF 25-30%; s/p single chamber ICD 2010; upgrade to Medtronic BiV ICD 01/30/12  . Systolic heart failure     class II  . Foot fracture 1953; 1962    left; left  . Chronic kidney disease   . Myocardial infarction 2008; 01/06/2012  . Syncope and collapse 01/05/2012  . Shortness of breath 01/05/2012    "all the time; been going on for awhile"  . Diabetes mellitus, type 2 2000  . Arthritis     "hands" (01/06/2012)  . Elbow mass     left; "just noticed it ~ 2 wk ago"  (01/06/2012)  . Anxiety   . Nightmares     with psych eval 2012 at [facility]  . PTSD (post-traumatic stress disorder)   . Tinnitus of left ear   . 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 Boil     "fluid boil left arm" (01/30/2012)  . LBBB (left bundle branch block)     Past Surgical History  Procedure Laterality Date  . Coronary artery bypass graft  2008    3 vessel, class 4 CHF, non Q wave MI 05/19/06  . Appendectomy  1957  . Cardiac defibrillator placement  01/30/2012    biventricular ICD  . Fracture surgery  1953; ~ 1962    left; left  . Cardiac defibrillator placement  01/30/2012    biventricular ICD  . Insert / replace / remove pacemaker  01/30/2012    biventricular ICD placed    Family History  Problem Relation Age of Onset  . Heart disease Father     MI age 11  . Diabetes Sister   . Hyperlipidemia Sister   .  Cancer Maternal Grandmother     liver, cirrhosis  . Depression Neg Hx   . Alcohol abuse Neg Hx   . Drug abuse Neg Hx   . Stroke Neg Hx     Social History:  reports  that he has quit smoking. His smoking use included Cigarettes. He has a 17.5 pack-year smoking history. He has never used smokeless tobacco. He reports that he drinks about 4.2 ounces of alcohol per week. He reports that he does not use illicit drugs.  Review of Systems:  Has history of coronary artery disease  Hypertension treated with Coreg and lisinopril low dose  HYPERLIPIDEMIA:  Currently taking Crestor Lab Results  Component Value Date   LDLCALC 61 04/27/2012   Creatinine 3.4 in July, recently better with reducing some medications, followed by nephrologist    No symptoms of numbness but may have occasional tingling in his feet   Examination:   BP 132/76  Pulse 88  Temp(Src) 98.6 F (37 C)  Resp 10  Ht 5\' 8"  (1.727 m)  Wt 237 lb 3.2 oz (107.593 kg)  BMI 36.07 kg/m2  SpO2 98%  Body mass index is 36.07 kg/(m^2).   ASSESSMENT/ PLAN::   Diabetes type 2   The patient's diabetes control appears to be significantly better with adding Victoza and adjusting his dose of Levemir Also taking relatively lower doses of Levemir than before Discussed needing to adjust mealtime dose based on meal size as well as the response judged by postprandial readings Since he is eating a smaller lunch she can try taking at least 6 units before lunch and increase 2-4 units if readings in the afternoons are over 180 Since his fasting readings are relatively good will continue the same dose of Levemir Discussed needing to check more readings after meals since he is taking primarily in the morning, this will help adjust his coverage for breakfast and supper Encouraged him to continue walking regularly Given information on assistance programs for insulin and Victoza  HYPERLIPIDEMIA: Currently followed by PCP, last LDL 61  Counseling time over 50% of today's 25 minute visit   Normalee Sistare 12/10/2012, 12:46 PM

## 2012-12-02 ENCOUNTER — Other Ambulatory Visit (INDEPENDENT_AMBULATORY_CARE_PROVIDER_SITE_OTHER): Payer: Medicare Other

## 2012-12-02 DIAGNOSIS — E119 Type 2 diabetes mellitus without complications: Secondary | ICD-10-CM

## 2012-12-02 LAB — FRUCTOSAMINE: Fructosamine: 326 umol/L — ABNORMAL HIGH (ref <285)

## 2012-12-02 LAB — HEMOGLOBIN A1C: Hgb A1c MFr Bld: 7 % — ABNORMAL HIGH (ref 4.6–6.5)

## 2012-12-07 ENCOUNTER — Encounter: Payer: Self-pay | Admitting: Family Medicine

## 2012-12-07 ENCOUNTER — Ambulatory Visit (INDEPENDENT_AMBULATORY_CARE_PROVIDER_SITE_OTHER): Payer: Medicare Other | Admitting: Family Medicine

## 2012-12-07 VITALS — BP 142/76 | HR 83 | Temp 98.2°F | Ht 68.0 in | Wt 239.0 lb

## 2012-12-07 DIAGNOSIS — N058 Unspecified nephritic syndrome with other morphologic changes: Secondary | ICD-10-CM

## 2012-12-07 DIAGNOSIS — E1129 Type 2 diabetes mellitus with other diabetic kidney complication: Secondary | ICD-10-CM

## 2012-12-07 NOTE — Progress Notes (Signed)
Diabetes:  He cut carbs/potatoes and lost weight intentionally, smaller portions.  Using medications without difficulties:yes Hypoglycemic episodes:no Hyperglycemic episodes:no Feet problems: occ tingling in the feet.  Some better than prev.   Blood Sugars averaging: ~130 eye exam within last year: seeing tomorrow, last done a few weeks ago.  A1c 7.0 now.   He cut back on etoh.  Now rare etoh.  He feels well. Not SOB. He can tell a difference with the weight loss.   Meds, vitals, and allergies reviewed.   ROS: See HPI.  Otherwise negative.    GEN: nad, alert and oriented HEENT: mucous membranes moist NECK: supple w/o LA CV: rrr. PULM: ctab, no inc wob ABD: soft, +bs EXT: no edema SKIN: no acute rash  Diabetic foot exam: Normal inspection No skin breakdown No calluses  Normal DP pulses Normal sensation to light touch and monofilament Nails normal

## 2012-12-07 NOTE — Patient Instructions (Addendum)
Take care and keep working on your weight.  Schedule a physical in the spring of 2015.  Glad to see you.

## 2012-12-08 ENCOUNTER — Other Ambulatory Visit: Payer: Self-pay | Admitting: Family Medicine

## 2012-12-08 NOTE — Assessment & Plan Note (Signed)
Now with A1c controlled, continue current meds.  Drinking less etoh, cutting carbs.  Feels better. Encouraged to continue as is.  App help of all involved.  Recheck in about 6 months at CPE.  He agrees.

## 2012-12-17 ENCOUNTER — Ambulatory Visit (INDEPENDENT_AMBULATORY_CARE_PROVIDER_SITE_OTHER): Payer: Medicare Other | Admitting: Cardiology

## 2012-12-17 ENCOUNTER — Other Ambulatory Visit: Payer: Self-pay | Admitting: *Deleted

## 2012-12-17 ENCOUNTER — Other Ambulatory Visit: Payer: Self-pay | Admitting: Family Medicine

## 2012-12-17 ENCOUNTER — Encounter: Payer: Self-pay | Admitting: Cardiology

## 2012-12-17 VITALS — BP 126/82 | HR 76 | Ht 68.0 in | Wt 236.0 lb

## 2012-12-17 DIAGNOSIS — I25810 Atherosclerosis of coronary artery bypass graft(s) without angina pectoris: Secondary | ICD-10-CM

## 2012-12-17 DIAGNOSIS — I5022 Chronic systolic (congestive) heart failure: Secondary | ICD-10-CM

## 2012-12-17 DIAGNOSIS — I739 Peripheral vascular disease, unspecified: Secondary | ICD-10-CM

## 2012-12-17 DIAGNOSIS — E785 Hyperlipidemia, unspecified: Secondary | ICD-10-CM

## 2012-12-17 MED ORDER — GLUCOSE BLOOD VI STRP: ORAL_STRIP | Status: DC

## 2012-12-17 NOTE — Patient Instructions (Addendum)
Your physician has requested that you have an echocardiogram. Echocardiography is a painless test that uses sound waves to create images of your heart. It provides your doctor with information about the size and shape of your heart and how well your heart's chambers and valves are working. This procedure takes approximately one hour. There are no restrictions for this procedure.  Your physician recommends that you return for a FASTING lipid profile.   Your physician recommends that you schedule a follow-up appointment in: 3 months with Dr Shirlee Latch.

## 2012-12-17 NOTE — Telephone Encounter (Signed)
Electronic refill request.  Please advise. 

## 2012-12-18 NOTE — Telephone Encounter (Signed)
Please call in.  Thanks.   

## 2012-12-19 NOTE — Progress Notes (Signed)
Patient ID: [name] [name], male   DOB: 05/23/1934, 78 y.o.   MRN: [id number] PCP: Dr. Para March  78 yo with history of CAD s/p CABG, ischemic cardiomyopathy/systolic CHF, LBBB, COPD, and CKD presents for cardiology followup.  He had CABG in 2008.  Last cath in 3/12 showed patent grafts.  EF is 20-25%.  He was admitted in 10/13 with acute on chronic systolic CHF.  He was volume overloaded. He had a mild TnI elevation with no chest pain.  He was diuresed and discharged.  After that, his ICD was upgraded to a Medtronic CRT-D system.    Since last appointment, he has done well.  Weight is down 9 lbs.  He is able to walk better now and knee is hurting less.  No dyspnea walking.  No chest pain.  He is using his CPAP at night.  When I saw him last, he had symptoms consistent with claudication.  ABI was 0.75 on right and 0.80 on left with diffuse bilateral SFA disease.  He saw Dr. Kirke Corin for evaluation and it was decided to continue medical management.  Since he has lost weight and started walking more, he really does not have significant claudication.    ECG: NSR, BiV paced.   Labs (10/13): K 3.6 => 4.2, creatinine 1.79 => 2.1 => 2.0, HDL 45, LDL 157, BNP 219 Labs (12/13): K 4, creatinine 1.9, BNP 209 Labs (1/14): LDL 61, HDL 46, LFTs normal Labs (0/86): BNP 199 Labs (3/14): K 4.1, creatinine 2.0 Labs (5/14): K 4.9, creatinine 3.0 Labs (8/14): K 4.2, creatinine 1.84  PMH: 1. CAD: S/p CABG 2008.  Cath 3/12 with totally occluded mLAD and RCA, 80% LCx stenosis, patent LIMA-LAD, patent SVG-OM, 40% stenosis in SVG-PDA.   2. Ischemic cardiomyopathy: Echo (10/13) with severely dilated LV, moderate LVH, EF 20-25%, mild MR.  Medtronic CRT-D device 10/13.  3. LBBB 4. COPD 5. Type II diabetes 6. Hyperlipidemia 7. HTN 8. CKD 9. Obesity 10. OA: knees. 11. PTSD 12. H/o ETOH abuse 13. OSA: Severe by sleep study, on CPAP.  14. PAD: ABIs (5/14) with 0.75 right, 0.80 left.   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   FH: Father with MI at 67  ROS: All systems reviewed and negative except as per HPI.   Current Outpatient Prescriptions  Medication Sig Dispense Refill  . aspirin 81 MG tablet Take 81 mg by mouth every other day.       . carvedilol (COREG) 25 MG tablet TAKE 1 1/2 TABLETS BY MOUTH TWICE DAILY  90 tablet  3  . clopidogrel (PLAVIX) 75 MG tablet Take 75 mg by mouth daily.      . Flaxseed, Linseed, (FLAX SEEDS) POWD Take 5 mLs by mouth every other day.       . insulin detemir (LEVEMIR) 100 UNIT/ML injection Inject 25 Units into the skin every morning. Add 1 unit every day until AM sugar is less than 150      . Liraglutide (VICTOZA) 18 MG/3ML SOPN Inject 1.2 mg into the skin.       Marland Kitchen lisinopril (PRINIVIL,ZESTRIL) 5 MG tablet Take 5 mg by mouth daily.       . nitroGLYCERIN (NITROSTAT) 0.4 MG SL tablet Place 1 tablet (0.4 mg total) under the tongue every 5 (five) minutes as needed. For chest pain  25 tablet  11  . NOVOLOG FLEXPEN 100 UNIT/ML SOPN FlexPen USE AS DIRECTED PER SLIDING SCALE. SUGAR100-150=10UNITS, 150-200=12UNITS, 200-250=14UNITS,  250+=16UNITS. USE 3 TIMES DAILY  30 mL  3  . rosuvastatin (CRESTOR) 40 MG tablet Take 1 tablet (40 mg total) by mouth daily.  30 tablet  3  . torsemide (DEMADEX) 20 MG tablet Take 2 tablets (40 mg total) by mouth daily.  60 tablet  3  . diazepam (VALIUM) 10 MG tablet TAKE 1/2 TO 1 TABLET AT BEDTIME AS NEEDED FOR ANXIETY OR RINGING IN EARS  30 tablet  1  . glucose blood (ONE TOUCH TEST STRIPS) test strip Use as instructed to check blood sugars 3 times per day  300 each  12   No current facility-administered medications for this visit.    BP 126/82  Pulse 76  Ht 5\' 8"  (1.727 m)  Wt 107.049 kg (236 lb)  BMI 35.89 kg/m2 General: NAD, obese Neck: Thick, no JVD, no thyromegaly or thyroid nodule.  Lungs: Clear to auscultation bilaterally with normal respiratory effort. CV: Nondisplaced PMI.  Heart  regular S1/S2, no S3/S4, no murmur.  1+ ankle edema.  No carotid bruit.  Difficult to palpate PT pulses.   Abdomen: Soft, nontender, no hepatosplenomegaly, no distention.  Neurologic: Alert and oriented x 3.  Psych: Normal affect. Extremities: No clubbing or cyanosis.   Assessment/Plan: 1. Chronic systolic CHF: EF 45-40% by last echo.  Status post CRT-D upgrade. NYHA class II symptoms with main limitation from his knee pain (improved).  He does not appear volume overloaded on exam.   - Continue current Coreg, lisinopril, and torsemide.   - BMET today.   - Echo to reassess EF with BiV pacing.  2. CAD: s/p CABG.  No recent chest pain.  Last cath in 3/12 with patent grafts.  He had a mild elevation in troponin during 10/13 admission with acute on chronic systolic CHF.  I think that this was most likely demand ischemia in the setting of significant volume overload.  We held off on cath given concern for demand ischemia and CKD.  - Continue ASA 81, Plavix, statin, and Coreg.   3. Hyperlipidemia: Check lipids.    4. OSA: Severe OSA, on CPAP.  5. CKD: Creatinine better recently.  BMET today.   6. PAD: Claudication improved with weight loss.  Not candidate for cilostazol given CHF.     Marca Ancona 12/19/2012

## 2012-12-20 NOTE — Telephone Encounter (Signed)
Rx called to pharmacy as instructed. 

## 2013-01-12 ENCOUNTER — Other Ambulatory Visit (HOSPITAL_COMMUNITY): Payer: Medicare Other

## 2013-01-17 ENCOUNTER — Telehealth: Payer: Self-pay | Admitting: Endocrinology

## 2013-01-18 ENCOUNTER — Other Ambulatory Visit: Payer: Self-pay | Admitting: *Deleted

## 2013-01-18 MED ORDER — LIRAGLUTIDE 18 MG/3ML ~~LOC~~ SOPN: 1.2000 mg | PEN_INJECTOR | Freq: Every day | SUBCUTANEOUS | Status: DC

## 2013-01-24 ENCOUNTER — Other Ambulatory Visit: Payer: Self-pay | Admitting: *Deleted

## 2013-01-24 DIAGNOSIS — E1129 Type 2 diabetes mellitus with other diabetic kidney complication: Secondary | ICD-10-CM

## 2013-01-25 ENCOUNTER — Other Ambulatory Visit: Payer: Medicare Other

## 2013-01-25 ENCOUNTER — Encounter: Payer: Self-pay | Admitting: Pulmonary Disease

## 2013-01-25 ENCOUNTER — Other Ambulatory Visit (INDEPENDENT_AMBULATORY_CARE_PROVIDER_SITE_OTHER): Payer: Medicare Other

## 2013-01-25 DIAGNOSIS — E785 Hyperlipidemia, unspecified: Secondary | ICD-10-CM

## 2013-01-25 DIAGNOSIS — E1129 Type 2 diabetes mellitus with other diabetic kidney complication: Secondary | ICD-10-CM

## 2013-01-25 DIAGNOSIS — N058 Unspecified nephritic syndrome with other morphologic changes: Secondary | ICD-10-CM

## 2013-01-25 LAB — LIPID PANEL
Total CHOL/HDL Ratio: 6
Triglycerides: 212 mg/dL — ABNORMAL HIGH (ref 0.0–149.0)
VLDL: 42.4 mg/dL — ABNORMAL HIGH (ref 0.0–40.0)

## 2013-01-25 LAB — URINALYSIS
Bilirubin Urine: NEGATIVE
Hgb urine dipstick: NEGATIVE
Ketones, ur: NEGATIVE
Leukocytes, UA: NEGATIVE
Nitrite: NEGATIVE
Specific Gravity, Urine: 1.01 (ref 1.000–1.030)
Urine Glucose: NEGATIVE
Urobilinogen, UA: 0.2 (ref 0.0–1.0)
pH: 6 (ref 5.0–8.0)

## 2013-01-25 LAB — LDL CHOLESTEROL, DIRECT: Direct LDL: 163.3 mg/dL

## 2013-01-25 LAB — MICROALBUMIN / CREATININE URINE RATIO
Creatinine,U: 68.1 mg/dL
Microalb Creat Ratio: 14.1 mg/g (ref 0.0–30.0)
Microalb, Ur: 9.6 mg/dL — ABNORMAL HIGH (ref 0.0–1.9)

## 2013-01-25 LAB — COMPREHENSIVE METABOLIC PANEL
ALT: 14 U/L (ref 0–53)
AST: 19 U/L (ref 0–37)
BUN: 18 mg/dL (ref 6–23)
CO2: 30 mEq/L (ref 19–32)
Calcium: 9.2 mg/dL (ref 8.4–10.5)
Chloride: 99 mEq/L (ref 96–112)
Creatinine, Ser: 1.7 mg/dL — ABNORMAL HIGH (ref 0.4–1.5)
GFR: 40.73 mL/min — ABNORMAL LOW (ref >60.00)
Total Bilirubin: 0.6 mg/dL (ref 0.3–1.2)

## 2013-01-25 LAB — HEMOGLOBIN A1C: Hgb A1c MFr Bld: 6.9 % — ABNORMAL HIGH (ref 4.6–6.5)

## 2013-01-27 ENCOUNTER — Other Ambulatory Visit (HOSPITAL_COMMUNITY): Payer: Self-pay | Admitting: Cardiology

## 2013-01-27 ENCOUNTER — Ambulatory Visit (HOSPITAL_COMMUNITY): Payer: Medicare Other | Attending: Cardiology | Admitting: Radiology

## 2013-01-27 DIAGNOSIS — I25810 Atherosclerosis of coronary artery bypass graft(s) without angina pectoris: Secondary | ICD-10-CM

## 2013-01-27 DIAGNOSIS — N189 Chronic kidney disease, unspecified: Secondary | ICD-10-CM | POA: Insufficient documentation

## 2013-01-27 DIAGNOSIS — J449 Chronic obstructive pulmonary disease, unspecified: Secondary | ICD-10-CM | POA: Insufficient documentation

## 2013-01-27 DIAGNOSIS — G4733 Obstructive sleep apnea (adult) (pediatric): Secondary | ICD-10-CM | POA: Insufficient documentation

## 2013-01-27 DIAGNOSIS — I5022 Chronic systolic (congestive) heart failure: Secondary | ICD-10-CM

## 2013-01-27 DIAGNOSIS — I509 Heart failure, unspecified: Secondary | ICD-10-CM

## 2013-01-27 DIAGNOSIS — I70209 Unspecified atherosclerosis of native arteries of extremities, unspecified extremity: Secondary | ICD-10-CM | POA: Insufficient documentation

## 2013-01-27 DIAGNOSIS — I252 Old myocardial infarction: Secondary | ICD-10-CM | POA: Insufficient documentation

## 2013-01-27 DIAGNOSIS — Z87891 Personal history of nicotine dependence: Secondary | ICD-10-CM | POA: Insufficient documentation

## 2013-01-27 DIAGNOSIS — I129 Hypertensive chronic kidney disease with stage 1 through stage 4 chronic kidney disease, or unspecified chronic kidney disease: Secondary | ICD-10-CM | POA: Insufficient documentation

## 2013-01-27 DIAGNOSIS — J4489 Other specified chronic obstructive pulmonary disease: Secondary | ICD-10-CM | POA: Insufficient documentation

## 2013-01-27 DIAGNOSIS — I2589 Other forms of chronic ischemic heart disease: Secondary | ICD-10-CM | POA: Insufficient documentation

## 2013-01-27 DIAGNOSIS — E785 Hyperlipidemia, unspecified: Secondary | ICD-10-CM | POA: Insufficient documentation

## 2013-01-27 DIAGNOSIS — I447 Left bundle-branch block, unspecified: Secondary | ICD-10-CM | POA: Insufficient documentation

## 2013-01-27 NOTE — Progress Notes (Signed)
Echocardiogram performed.  

## 2013-01-28 ENCOUNTER — Telehealth: Payer: Self-pay | Admitting: Cardiology

## 2013-01-28 NOTE — Telephone Encounter (Signed)
New message    Returning someone's call---perhaps to get echo results--He did not know who it was.

## 2013-01-28 NOTE — Telephone Encounter (Signed)
Spoke with patient about echo results 

## 2013-02-02 ENCOUNTER — Ambulatory Visit: Payer: Medicare Other | Admitting: Endocrinology

## 2013-02-08 ENCOUNTER — Ambulatory Visit (INDEPENDENT_AMBULATORY_CARE_PROVIDER_SITE_OTHER): Payer: Medicare Other | Admitting: Endocrinology

## 2013-02-08 ENCOUNTER — Encounter: Payer: Self-pay | Admitting: Endocrinology

## 2013-02-08 VITALS — BP 142/92 | HR 86 | Temp 97.8°F | Resp 12 | Wt 234.7 lb

## 2013-02-08 DIAGNOSIS — Z23 Encounter for immunization: Secondary | ICD-10-CM

## 2013-02-08 DIAGNOSIS — E1129 Type 2 diabetes mellitus with other diabetic kidney complication: Secondary | ICD-10-CM

## 2013-02-08 NOTE — Progress Notes (Signed)
Patient ID: [name] [name], male   DOB: [birth year], 78 y.o.   MRN: [id number]  [name] [name] is an 78 y.o. male.   Reason for Appointment: Diabetes follow-up   History of Present Illness   Diagnosis: Type 2 DIABETES MELITUS, date of diagnosis: 2002   He has been on insulin since about 2008 with variable control. In January his A1c was 10.1% His diabetes control was significantly better on his previous visit with adding Victoza and adjusting his dose of Levemir Also taking relatively lower doses of Levemir than before Also he was told to start taking coverage for lunch with NovoLog which he was not doing For some reason he has reduced his NovoLog on his own before meals but his blood sugars appear to be significantly higher at least after breakfast and periodically after supper also He is checking his blood sugars somewhat sporadically after meals and mostly before breakfast and some before bedtime Overall average blood sugar at home is 162 He has had difficulty affording his medications but is now taking all his medications as directed     Oral hypoglycemic drugs: None, has limitations because of CRF        Side effects from medications: None Insulin regimen: Levemir 25 units daily, NovoLog 10-12  units before meals       Proper timing of medications in relation to meals: Yes.         Monitors blood glucose: Once a day.    Glucometer: One Touch           Blood Glucose readings from meter review: readings are mostly before breakfast: 114-167, midday 227-235, after 9 PM 93-205 Median reading in the morning 148, late evening about 160 Hypoglycemia frequency:  none recently.          Meals: 3 meals per day.eating breakfast at 9 am with either oatmeal or apple/peanut butter         Physical activity: exercise: Walking about 20-30 min almost daily           Dietician visit: Most recent: 2010           Complications: are: ? Nephropathy     Lab Results  Component Value Date   HGBA1C 6.9*  01/25/2013     Wt Readings from Last 3 Encounters:  02/08/13 234 lb 11.2 oz (106.459 kg)  12/17/12 236 lb (107.049 kg)  12/07/12 239 lb (108.41 kg)      Medication List       This list is accurate as of: 02/08/13  3:30 PM.  Always use your most recent med list.               aspirin 81 MG tablet  Take 81 mg by mouth every other day.     carvedilol 25 MG tablet  Commonly known as:  COREG  TAKE 1 1/2 TABLETS BY MOUTH TWICE DAILY     clopidogrel 75 MG tablet  Commonly known as:  PLAVIX  Take 75 mg by mouth daily.     diazepam 10 MG tablet  Commonly known as:  VALIUM  TAKE 1/2 TO 1 TABLET AT BEDTIME AS NEEDED FOR ANXIETY OR RINGING IN EARS     Flax Seeds Powd  Take 5 mLs by mouth every other day.     glucose blood test strip  Commonly known as:  ONE TOUCH TEST STRIPS  Use as instructed to check blood sugars 3 times per day     insulin detemir 100 UNIT/ML  injection  Commonly known as:  LEVEMIR  Inject 25 Units into the skin every morning. Add 1 unit every day until AM sugar is less than 150     Liraglutide 18 MG/3ML Sopn  Commonly known as:  VICTOZA  Inject 1.2 mg into the skin daily.     lisinopril 5 MG tablet  Commonly known as:  PRINIVIL,ZESTRIL  Take 5 mg by mouth daily.     nitroGLYCERIN 0.4 MG SL tablet  Commonly known as:  NITROSTAT  Place 1 tablet (0.4 mg total) under the tongue every 5 (five) minutes as needed. For chest pain     NOVOLOG FLEXPEN 100 UNIT/ML Sopn FlexPen  Generic drug:  insulin aspart  USE AS DIRECTED PER SLIDING SCALE. SUGAR100-150=10UNITS, 150-200=12UNITS, 200-250=14UNITS, 250+=16UNITS. USE 3 TIMES DAILY     rosuvastatin 40 MG tablet  Commonly known as:  CRESTOR  Take 1 tablet (40 mg total) by mouth daily.     torsemide 20 MG tablet  Commonly known as:  DEMADEX  Take 2 tablets (40 mg total) by mouth daily.        Allergies:  Allergies  Allergen Reactions  . Sulfa Antibiotics Swelling and Rash    Hands swell    Past  Medical History  Diagnosis Date  . COPD (chronic obstructive pulmonary disease) 1993  . Hyperlipidemia 1993  . Hypertension 1993  . CAD (coronary artery disease)     bypass graft surgery 05/2006; grafts were widely patent on cath 06/2010   . Ischemic cardiomyopathy     EF 25-30%; s/p single chamber ICD 2010; upgrade to Medtronic BiV ICD 01/30/12  . Systolic heart failure     class II  . Foot fracture 1953; 1962    left; left  . Chronic kidney disease   . Myocardial infarction 2008; 01/06/2012  . Syncope and collapse 01/05/2012  . Shortness of breath 01/05/2012    "all the time; been going on for awhile"  . Diabetes mellitus, type 2 2000  . Arthritis     "hands" (01/06/2012)  . Elbow mass     left; "just noticed it ~ 2 wk ago"  (01/06/2012)  . Anxiety   . Nightmares     with psych eval 2012 at [facility]  . PTSD (post-traumatic stress disorder)   . Tinnitus of left ear   . 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 Boil     "fluid boil left arm" (01/30/2012)  . LBBB (left bundle branch block)     Past Surgical History  Procedure Laterality Date  . Coronary artery bypass graft  2008    3 vessel, class 4 CHF, non Q wave MI 05/19/06  . Appendectomy  1957  . Cardiac defibrillator placement  01/30/2012    biventricular ICD  . Fracture surgery  1953; ~ 1962    left; left  . Cardiac defibrillator placement  01/30/2012    biventricular ICD  . Insert / replace / remove pacemaker  01/30/2012    biventricular ICD placed    Family History  Problem Relation Age of Onset  . Heart disease Father     MI age 4  . Diabetes Sister   . Hyperlipidemia Sister   . Cancer Maternal Grandmother     liver, cirrhosis  . Depression Neg Hx   . Alcohol abuse Neg Hx   . Drug abuse Neg Hx   . Stroke Neg Hx     Social History:  reports that he has quit smoking. His smoking  use included Cigarettes. He has a 17.5 pack-year smoking history. He has never used smokeless tobacco. He reports that he drinks about 4.2 ounces of  alcohol per week. He reports that he does not use illicit drugs.  Review of Systems:  Has history of coronary artery disease  Hypertension treated with Coreg and lisinopril low dose  HYPERLIPIDEMIA:  Currently taking Crestor Lab Results  Component Value Date   LDLCALC 61 04/27/2012   Creatinine has been about 3, followed by nephrologist    No symptoms of numbness but may have occasional tingling in his feet   Examination:   BP 142/92  Pulse 86  Temp(Src) 97.8 F (36.6 C) (Oral)  Resp 12  Wt 234 lb 11.2 oz (106.459 kg)  SpO2 96%  Body mass index is 35.69 kg/(m^2).   ASSESSMENT/ PLAN::   Diabetes type 2   His blood sugars are somewhat erratic although overall fairly good with A1c now below 7%; however not clear if this is entirely accurate because of his renal dysfunction Problems at this time: 1. Not taking enough basal insulin as most fasting readings are relatively high 2. Not checking blood sugars after lunch and not clear how much his lunchtime coverage should be. Also not checking enough readings after supper recently 3. Recently better readings after supper but has had occasional high readings 4. Most of his readings at mid day are high but he does not know how to explain this 5. He is adjusting his pre-meal insulin based on the blood sugar at that time but not what he is eating. He is usually not checking his blood sugar before supper anyway  PLAN: He will increase his Levemir by at least 2 units and also morning NovoLog by 4 units if eating oatmeal He was scheduled to have review of his day-to-day diabetes management with the nurse educator   [facility] 02/08/2013, 3:30 PM

## 2013-02-08 NOTE — Patient Instructions (Addendum)
With Oatmeal take 16 units of Orange/Novolog insulin  Green Levemir insulin 27 units in am, this controls sugar in ams  Please check blood sugars at least half the time about 2 hours after any meal and as directed on waking up. Please bring blood sugar monitor to each visit

## 2013-02-10 ENCOUNTER — Other Ambulatory Visit: Payer: Self-pay | Admitting: Family Medicine

## 2013-02-17 ENCOUNTER — Telehealth: Payer: Self-pay

## 2013-02-17 ENCOUNTER — Other Ambulatory Visit: Payer: Self-pay

## 2013-02-17 MED ORDER — TORSEMIDE 20 MG PO TABS: 40.0000 mg | ORAL_TABLET | Freq: Every day | ORAL | Status: DC

## 2013-02-17 NOTE — Telephone Encounter (Signed)
Phone note completed ° °

## 2013-02-17 NOTE — Telephone Encounter (Signed)
Phone call

## 2013-03-08 ENCOUNTER — Encounter: Payer: Medicare Other | Admitting: Nutrition

## 2013-03-17 ENCOUNTER — Encounter: Payer: Self-pay | Admitting: Cardiology

## 2013-03-17 ENCOUNTER — Ambulatory Visit (INDEPENDENT_AMBULATORY_CARE_PROVIDER_SITE_OTHER): Payer: Medicare Other | Admitting: Cardiology

## 2013-03-17 VITALS — BP 148/62 | HR 80 | Ht 68.0 in | Wt 235.0 lb

## 2013-03-17 DIAGNOSIS — E785 Hyperlipidemia, unspecified: Secondary | ICD-10-CM

## 2013-03-17 DIAGNOSIS — I25810 Atherosclerosis of coronary artery bypass graft(s) without angina pectoris: Secondary | ICD-10-CM

## 2013-03-17 DIAGNOSIS — N184 Chronic kidney disease, stage 4 (severe): Secondary | ICD-10-CM

## 2013-03-17 DIAGNOSIS — I5022 Chronic systolic (congestive) heart failure: Secondary | ICD-10-CM

## 2013-03-17 MED ORDER — LISINOPRIL 5 MG PO TABS: ORAL_TABLET | ORAL | Status: DC

## 2013-03-17 NOTE — Patient Instructions (Signed)
Increase lisinopril to 7.5mg  daily. This will be 1 and 1/2 of a 5mg  tablet daily.   Your physician recommends that you return for a FASTING lipid profile /BMET/BNP in about 10 days.  Your physician recommends that you schedule a follow-up appointment in: 3 months with Dr Shirlee Latch.

## 2013-03-17 NOTE — Progress Notes (Signed)
Patient ID: [name] [name], male   DOB: 12/31/1934, 78 y.o.   MRN: [id number] PCP: Dr. Para March  78 yo with history of CAD s/p CABG, ischemic cardiomyopathy/systolic CHF, LBBB, COPD, and CKD presents for cardiology followup.  He had CABG in 2008.  Last cath in 3/12 showed patent grafts.  EF was 20-25%.  He was admitted in 10/13 with acute on chronic systolic CHF.  He was volume overloaded. He had a mild TnI elevation with no chest pain.  He was diuresed and discharged.  After that, his ICD was upgraded to a Medtronic CRT-D system.  EF improved to 35-40% on echo in 10/14.   Since last appointment, he has done well.  Weight is down 1 lb.  No dyspnea walking.  No chest pain.  He is using his CPAP at night.  He has PAD but denies any significant claudication at this time. LDL was very high in 10/14. He had stopped his Crestor but now has restarted it.    Labs (10/13): K 3.6 => 4.2, creatinine 1.79 => 2.1 => 2.0, HDL 45, LDL 157, BNP 219 Labs (12/13): K 4, creatinine 1.9, BNP 209 Labs (1/14): LDL 61, HDL 46, LFTs normal Labs (4/09): BNP 199 Labs (3/14): K 4.1, creatinine 2.0 Labs (5/14): K 4.9, creatinine 3.0 Labs (8/14): K 4.2, creatinine 1.84 Labs (10/14): K 3.8, creatinine 1.7  PMH: 1. CAD: S/p CABG 2008.  Cath 3/12 with totally occluded mLAD and RCA, 80% LCx stenosis, patent LIMA-LAD, patent SVG-OM, 40% stenosis in SVG-PDA.   2. Ischemic cardiomyopathy: Echo (10/13) with severely dilated LV, moderate LVH, EF 20-25%, mild MR.  Medtronic CRT-D device 10/13.  Echo (10/14) with severely dilated LV, mild LVH, EF 35-40% (improved).  3. LBBB 4. COPD 5. Type II diabetes 6. Hyperlipidemia 7. HTN 8. CKD 9. Obesity 10. OA: knees. 11. PTSD 12. H/o ETOH abuse 13. OSA: Severe by sleep study, on CPAP.  14. PAD: ABIs (5/14) with 0.75 right, 0.80 left.   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   FH: Father with MI at 54  ROS: All systems  reviewed and negative except as per HPI.   Current Outpatient Prescriptions  Medication Sig Dispense Refill  . aspirin 81 MG tablet Take 81 mg by mouth every other day.       . carvedilol (COREG) 25 MG tablet TAKE 1 1/2 TABLETS BY MOUTH TWICE DAILY  90 tablet  3  . clopidogrel (PLAVIX) 75 MG tablet Take 75 mg by mouth daily.      . diazepam (VALIUM) 10 MG tablet TAKE 1/2 TO 1 TABLET AT BEDTIME AS NEEDED FOR ANXIETY OR RINGING IN EARS  30 tablet  1  . Flaxseed, Linseed, (FLAX SEEDS) POWD Take 5 mLs by mouth every other day.       Marland Kitchen glucose blood (ONE TOUCH TEST STRIPS) test strip Use as instructed to check blood sugars 3 times per day  300 each  12  . insulin aspart (NOVOLOG FLEXPEN) 100 UNIT/ML SOPN FlexPen 10-16 ac      . insulin detemir (LEVEMIR) 100 UNIT/ML injection Inject 25 Units into the skin every morning. Add 1 unit every day until AM sugar is less than 150      . Liraglutide (VICTOZA) 18 MG/3ML SOPN Inject 1.2 mg into the skin daily.  2 pen  5  . nitroGLYCERIN (NITROSTAT) 0.4 MG SL tablet Place 1 tablet (0.4 mg total) under the tongue every 5 (five)  minutes as needed. For chest pain  25 tablet  11  . rosuvastatin (CRESTOR) 40 MG tablet Take 1 tablet (40 mg total) by mouth daily.  30 tablet  3  . torsemide (DEMADEX) 20 MG tablet Take 2 tablets (40 mg total) by mouth daily.  60 tablet  3  . lisinopril (PRINIVIL,ZESTRIL) 5 MG tablet 1 and 1/2 tablets (total 7.5mg ) daily  45 tablet  4   No current facility-administered medications for this visit.    BP 148/62  Pulse 80  Ht 5\' 8"  (1.727 m)  Wt 106.595 kg (235 lb)  BMI 35.74 kg/m2 General: NAD, obese Neck: Thick, no JVD, no thyromegaly or thyroid nodule.  Lungs: Clear to auscultation bilaterally with normal respiratory effort. CV: Nondisplaced PMI.  Heart regular S1/S2, no S3/S4, no murmur.  No edema.  No carotid bruit.  Difficult to palpate PT pulses.   Abdomen: Soft, nontender, no hepatosplenomegaly, no distention.  Neurologic:  Alert and oriented x 3.  Psych: Normal affect. Extremities: No clubbing or cyanosis.   Assessment/Plan: 1. Chronic systolic CHF: EF 29-56% by last echo (improved).  Status post CRT-D upgrade. NYHA class II symptoms with main limitation from his knee pain (improved).  He does not appear volume overloaded on exam.   - Continue current Coreg and torsemide.   - As creatinine has been stable, I will increase lisinopril to 7.5 mg daily. Need BMET/BNP in 10 days.    2. CAD: s/p CABG.  No recent chest pain.  Last cath in 3/12 with patent grafts.  He had a mild elevation in troponin during 10/13 admission with acute on chronic systolic CHF.  I think that this was most likely demand ischemia in the setting of significant volume overload.  We held off on cath given concern for demand ischemia and CKD.  - Continue ASA 81, Plavix, statin, and Coreg.   3. Hyperlipidemia: Back on Crestor, repeat lipids in 10 days.     4. OSA: Severe OSA, on CPAP.  5. CKD: Creatinine better recently.  BMET in 10 days after increasing lisinopril to 7.5 mg daily.  6. PAD: Claudication improved with weight loss.  Not candidate for cilostazol given CHF.   Repeat ABIs in 5/15.   Followup in 3 months.   Marca Ancona 03/17/2013   Marca Ancona 03/17/2013

## 2013-03-28 ENCOUNTER — Other Ambulatory Visit (INDEPENDENT_AMBULATORY_CARE_PROVIDER_SITE_OTHER): Payer: Medicare Other

## 2013-03-28 DIAGNOSIS — I5022 Chronic systolic (congestive) heart failure: Secondary | ICD-10-CM

## 2013-03-28 DIAGNOSIS — I25810 Atherosclerosis of coronary artery bypass graft(s) without angina pectoris: Secondary | ICD-10-CM

## 2013-03-28 DIAGNOSIS — E785 Hyperlipidemia, unspecified: Secondary | ICD-10-CM

## 2013-03-28 LAB — BASIC METABOLIC PANEL
BUN: 26 mg/dL — ABNORMAL HIGH (ref 6–23)
CO2: 28 mEq/L (ref 19–32)
Chloride: 101 mEq/L (ref 96–112)
Creatinine, Ser: 1.8 mg/dL — ABNORMAL HIGH (ref 0.4–1.5)
Glucose, Bld: 105 mg/dL — ABNORMAL HIGH (ref 70–99)
Potassium: 3.7 mEq/L (ref 3.5–5.1)

## 2013-03-28 LAB — LIPID PANEL
HDL: 38 mg/dL — ABNORMAL LOW (ref >39.00)
LDL Cholesterol: 83 mg/dL (ref 0–99)
Total CHOL/HDL Ratio: 4
Triglycerides: 104 mg/dL (ref 0.0–149.0)
VLDL: 20.8 mg/dL (ref 0.0–40.0)

## 2013-03-29 ENCOUNTER — Telehealth: Payer: Self-pay | Admitting: Cardiology

## 2013-03-29 NOTE — Telephone Encounter (Signed)
**Note De-Identified Karie Skowron Obfuscation** Pt given lab results, he verbalized understanding and requested to speak Mylo Red, RN as she called and left message for him to call office. Note forwarded to Mylo Red.

## 2013-03-29 NOTE — Telephone Encounter (Signed)
New Problem: ° °Pt is requesting a call back with his test results.  °

## 2013-05-09 ENCOUNTER — Other Ambulatory Visit: Payer: Self-pay | Admitting: Family Medicine

## 2013-05-11 ENCOUNTER — Other Ambulatory Visit (INDEPENDENT_AMBULATORY_CARE_PROVIDER_SITE_OTHER): Payer: Medicare HMO

## 2013-05-11 DIAGNOSIS — IMO0002 Reserved for concepts with insufficient information to code with codable children: Secondary | ICD-10-CM

## 2013-05-11 DIAGNOSIS — E1165 Type 2 diabetes mellitus with hyperglycemia: Secondary | ICD-10-CM

## 2013-05-11 DIAGNOSIS — IMO0001 Reserved for inherently not codable concepts without codable children: Secondary | ICD-10-CM

## 2013-05-11 DIAGNOSIS — E1129 Type 2 diabetes mellitus with other diabetic kidney complication: Secondary | ICD-10-CM

## 2013-05-11 DIAGNOSIS — E785 Hyperlipidemia, unspecified: Secondary | ICD-10-CM

## 2013-05-11 LAB — LIPID PANEL
CHOLESTEROL: 144 mg/dL (ref 0–200)
HDL: 38.4 mg/dL — AB (ref >39.00)
LDL CALC: 84 mg/dL (ref 0–99)
TRIGLYCERIDES: 109 mg/dL (ref 0.0–149.0)
Total CHOL/HDL Ratio: 4
VLDL: 21.8 mg/dL (ref 0.0–40.0)

## 2013-05-11 LAB — BASIC METABOLIC PANEL
BUN: 30 mg/dL — ABNORMAL HIGH (ref 6–23)
CHLORIDE: 101 meq/L (ref 96–112)
CO2: 27 meq/L (ref 19–32)
Calcium: 9 mg/dL (ref 8.4–10.5)
Creatinine, Ser: 1.6 mg/dL — ABNORMAL HIGH (ref 0.4–1.5)
GFR: 46.51 mL/min — ABNORMAL LOW (ref >60.00)
Glucose, Bld: 119 mg/dL — ABNORMAL HIGH (ref 70–99)
POTASSIUM: 3.6 meq/L (ref 3.5–5.1)
Sodium: 137 mEq/L (ref 135–145)

## 2013-05-11 LAB — HEMOGLOBIN A1C: Hgb A1c MFr Bld: 7.1 % — ABNORMAL HIGH (ref 4.6–6.5)

## 2013-05-12 LAB — FRUCTOSAMINE: FRUCTOSAMINE: 299 umol/L — AB (ref <285)

## 2013-05-13 ENCOUNTER — Ambulatory Visit: Payer: Medicare Other | Admitting: Endocrinology

## 2013-06-01 ENCOUNTER — Encounter: Payer: Self-pay | Admitting: Endocrinology

## 2013-06-01 ENCOUNTER — Ambulatory Visit (INDEPENDENT_AMBULATORY_CARE_PROVIDER_SITE_OTHER): Payer: Medicare HMO | Admitting: Endocrinology

## 2013-06-01 VITALS — BP 134/68 | HR 79 | Temp 98.3°F | Resp 16 | Ht 68.0 in | Wt 244.0 lb

## 2013-06-01 DIAGNOSIS — I10 Essential (primary) hypertension: Secondary | ICD-10-CM

## 2013-06-01 DIAGNOSIS — E1129 Type 2 diabetes mellitus with other diabetic kidney complication: Secondary | ICD-10-CM

## 2013-06-01 DIAGNOSIS — E1165 Type 2 diabetes mellitus with hyperglycemia: Principal | ICD-10-CM

## 2013-06-01 DIAGNOSIS — E785 Hyperlipidemia, unspecified: Secondary | ICD-10-CM

## 2013-06-01 DIAGNOSIS — R635 Abnormal weight gain: Secondary | ICD-10-CM

## 2013-06-01 NOTE — Progress Notes (Signed)
Patient ID: [name] [name], male   DOB: 1934/10/16, 79 y.o.   MRN: [id number]   Reason for Appointment: Diabetes follow-up   History of Present Illness   Diagnosis: Type 2 DIABETES MELITUS, date of diagnosis: 2002   He has been on insulin since about 2008 with variable control. In 04/2012 his A1c was 10.1% His diabetes control was significantly better with adding Victoza in early 2014 and adjusting his dose of Levemir Also was able to take lower doses of Levemir than before Recent history: Although his A1c is very consistent around 7% he appears to have significantly high readings after his evening meal now He thinks he is compliant with taking his mealtime insulin with every meal consistently However he is taking only about 10 units of NovoLog at suppertime even though he was asked to increase his dose by 4 units in the last visit Not clear what his blood sugars are after breakfast and lunch He has had difficulty affording his medications but is now taking all his medications as directed He has however gained a significant amount of weight and not clear why.  Oral hypoglycemic drugs: None         Side effects from medications: None Insulin regimen: Levemir 25 units daily, NovoLog 10-12  units before meals       Proper timing of medications in relation to meals: Yes.         Monitors blood glucose: Once a day.    Glucometer: One Touch           Blood Glucose readings from meter download:   PREMEAL Breakfast Lunch Dinner  PCS  Overall  Glucose range:  121-228  ?  ?   175-260    Mean/median:  150     220   164   Hypoglycemia frequency:  none recently.           Meals: 3 meals per day.eating breakfast at 11 am with either oatmeal or apple/peanut butter; lunch 3 PM dinner 6-7 pm         Physical activity: exercise: Walking about 20-30 min on some days              Dietician visit: Most recent: 4098           Complications: are: ? Nephropathy     Wt Readings from Last 3 Encounters:   06/01/13 244 lb (110.678 kg)  03/17/13 235 lb (106.595 kg)  02/08/13 234 lb 11.2 oz (106.459 kg)   Lab Results  Component Value Date   HGBA1C 7.1* 05/11/2013   HGBA1C 6.9* 01/25/2013   HGBA1C 7.0* 12/02/2012   Lab Results  Component Value Date   MICROALBUR 9.6* 01/25/2013   LDLCALC 84 05/11/2013   CREATININE 1.6* 05/11/2013       Medication List       This list is accurate as of: 06/01/13  3:20 PM.  Always use your most recent med list.               aspirin 81 MG tablet  Take 81 mg by mouth every other day.     carvedilol 25 MG tablet  Commonly known as:  COREG  TAKE 1& 1/2 TABLETS BY MOUTH TWICE DAILY     diazepam 10 MG tablet  Commonly known as:  VALIUM  TAKE 1/2 TO 1 TABLET AT BEDTIME AS NEEDED FOR ANXIETY OR RINGING IN EARS     Flax Seeds Powd  Take 5 mLs by mouth every other  day.     glucose blood test strip  Commonly known as:  ONE TOUCH TEST STRIPS  Use as instructed to check blood sugars 3 times per day     insulin detemir 100 UNIT/ML injection  Commonly known as:  LEVEMIR  Inject 25 Units into the skin every morning. Add 1 unit every day until AM sugar is less than 150     Liraglutide 18 MG/3ML Sopn  Commonly known as:  VICTOZA  Inject 1.2 mg into the skin daily.     lisinopril 5 MG tablet  Commonly known as:  PRINIVIL,ZESTRIL  1 and 1/2 tablets (total 7.5mg ) daily     nitroGLYCERIN 0.4 MG SL tablet  Commonly known as:  NITROSTAT  Place 1 tablet (0.4 mg total) under the tongue every 5 (five) minutes as needed. For chest pain     NOVOLOG FLEXPEN 100 UNIT/ML FlexPen  Generic drug:  insulin aspart  10-12 ac     rosuvastatin 40 MG tablet  Commonly known as:  CRESTOR  Take 1 tablet (40 mg total) by mouth daily.     torsemide 20 MG tablet  Commonly known as:  DEMADEX  Take 2 tablets (40 mg total) by mouth daily.        Allergies:  Allergies  Allergen Reactions  . Sulfa Antibiotics Swelling and Rash    Hands swell    Past Medical  History  Diagnosis Date  . COPD (chronic obstructive pulmonary disease) 1993  . Hyperlipidemia 1993  . Hypertension 1993  . CAD (coronary artery disease)     bypass graft surgery 05/2006; grafts were widely patent on cath 06/2010   . Ischemic cardiomyopathy     EF 25-30%; s/p single chamber ICD 2010; upgrade to Medtronic BiV ICD 01/30/12  . Systolic heart failure     class II  . Foot fracture 1953; 1962    left; left  . Chronic kidney disease   . Myocardial infarction 2008; 01/06/2012  . Syncope and collapse 01/05/2012  . Shortness of breath 01/05/2012    "all the time; been going on for awhile"  . Diabetes mellitus, type 2 2000  . Arthritis     "hands" (01/06/2012)  . Elbow mass     left; "just noticed it ~ 2 wk ago"  (01/06/2012)  . Anxiety   . Nightmares     with psych eval 2012 at [facility]  . PTSD (post-traumatic stress disorder)   . Tinnitus of left ear   . 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 Boil     "fluid boil left arm" (01/30/2012)  . LBBB (left bundle branch block)     Past Surgical History  Procedure Laterality Date  . Coronary artery bypass graft  2008    3 vessel, class 4 CHF, non Q wave MI 05/19/06  . Appendectomy  1957  . Cardiac defibrillator placement  01/30/2012    biventricular ICD  . Fracture surgery  1953; ~ 1962    left; left  . Cardiac defibrillator placement  01/30/2012    biventricular ICD  . Insert / replace / remove pacemaker  01/30/2012    biventricular ICD placed    Family History  Problem Relation Age of Onset  . Heart disease Father     MI age 58  . Diabetes Sister   . Hyperlipidemia Sister   . Cancer Maternal Grandmother     liver, cirrhosis  . Depression Neg Hx   . Alcohol abuse Neg Hx   .  Drug abuse Neg Hx   . Stroke Neg Hx     Social History:  reports that he has quit smoking. His smoking use included Cigarettes. He has a 17.5 pack-year smoking history. He has never used smokeless tobacco. He reports that he drinks about 4.2 ounces of alcohol  per week. He reports that he does not use illicit drugs.  Review of Systems:  Has history of coronary artery disease  Hypertension treated with Coreg and lisinopril low dose with good control  HYPERLIPIDEMIA:  Currently taking Crestor 40 mg but his LDL is still over 70  Lab Results  Component Value Date   CHOL 144 05/11/2013   HDL 38.40* 05/11/2013   LDLCALC 84 05/11/2013   LDLDIRECT 163.3 01/25/2013   TRIG 109.0 05/11/2013   CHOLHDL 4 05/11/2013   His creatinine has progressively after adjustment of various medications by nephrologist; now it is 1.6     Examination:   BP 134/68  Pulse 79  Temp(Src) 98.3 F (36.8 C)  Resp 16  Ht 5\' 8"  (1.727 m)  Wt 244 lb (110.678 kg)  BMI 37.11 kg/m2  SpO2 98%  Body mass index is 37.11 kg/(m^2).   No ankle edema present  ASSESSMENT/ PLAN:   Diabetes type 2   His blood sugars are somewhat erratic in the morning and even though his average glucose at home is about 180 his A1c seems lower than expected again around 7% Problems identified at this time: 1. Not taking enough mealtime insulin as most after supper readings are over 200  2. Not checking blood sugars after breakfast or lunch and not clear how much his mealtime coverage should be at those times.  3. Significant weight gain since his last visit even though he is on Victoza and also trying to exercise when he can 4. Variable fasting readings, some high readings may be related to his meal or snack intake the night before Need for improved diet for weight loss. He thinks he will try the Nutrisystem diet again which had helped him before.Marland Kitchen However they are at least over 140 since 2/19  PLAN:   He will increase his suppertime NovoLog to at least 14 units, discussed postprandial targets of at least less than 180  Increase Levemir if his fasting blood sugars are consistently high, discussed fasting glucose targets  More consistent walking exercise  More moderate intake of carbohydrate and  fat  More blood sugars after breakfast and lunch to help adjust mealtime coverage  CKD: Improved with creatinine 1.6, to continue followup with nephrologist  HYPERLIPIDEMIA: Taking high dose statin drug and will continue  Counseling time over 50% of today's 25 minute visit  Neena Beecham 06/01/2013, 3:20 PM

## 2013-06-01 NOTE — Patient Instructions (Addendum)
NOVOLOG 16 UNITS AT DINNER DAILY and sugar after 2 hrs should be at least <180  MUST CHECK MORE SUGARS IN BETWEEN MEALS and also on waking up every 2 days  2 small servings of fruit daily

## 2013-06-02 ENCOUNTER — Other Ambulatory Visit: Payer: Medicare Other

## 2013-06-06 ENCOUNTER — Other Ambulatory Visit: Payer: Medicare HMO

## 2013-06-10 ENCOUNTER — Ambulatory Visit (INDEPENDENT_AMBULATORY_CARE_PROVIDER_SITE_OTHER): Payer: Medicare HMO | Admitting: Family Medicine

## 2013-06-10 ENCOUNTER — Encounter: Payer: Self-pay | Admitting: Family Medicine

## 2013-06-10 VITALS — BP 130/68 | HR 78 | Temp 98.0°F | Ht 68.0 in | Wt 238.2 lb

## 2013-06-10 DIAGNOSIS — Z1211 Encounter for screening for malignant neoplasm of colon: Secondary | ICD-10-CM

## 2013-06-10 DIAGNOSIS — I5023 Acute on chronic systolic (congestive) heart failure: Secondary | ICD-10-CM

## 2013-06-10 DIAGNOSIS — F515 Nightmare disorder: Secondary | ICD-10-CM

## 2013-06-10 DIAGNOSIS — N289 Disorder of kidney and ureter, unspecified: Secondary | ICD-10-CM

## 2013-06-10 DIAGNOSIS — I10 Essential (primary) hypertension: Secondary | ICD-10-CM

## 2013-06-10 DIAGNOSIS — E785 Hyperlipidemia, unspecified: Secondary | ICD-10-CM

## 2013-06-10 DIAGNOSIS — E1129 Type 2 diabetes mellitus with other diabetic kidney complication: Secondary | ICD-10-CM

## 2013-06-10 DIAGNOSIS — N183 Chronic kidney disease, stage 3 unspecified: Secondary | ICD-10-CM

## 2013-06-10 DIAGNOSIS — E1165 Type 2 diabetes mellitus with hyperglycemia: Secondary | ICD-10-CM

## 2013-06-10 DIAGNOSIS — Z0000 Encounter for general adult medical examination without abnormal findings: Secondary | ICD-10-CM

## 2013-06-10 DIAGNOSIS — G4700 Insomnia, unspecified: Secondary | ICD-10-CM

## 2013-06-10 DIAGNOSIS — Z23 Encounter for immunization: Secondary | ICD-10-CM

## 2013-06-10 NOTE — Progress Notes (Signed)
Pre visit review using our clinic review tool, if applicable. No additional management support is needed unless otherwise documented below in the visit note.  CPE- See plan.  Routine anticipatory guidance given to patient.  See health maintenance. I didn't know until today about this FH prostate cancer. He father "had it for years" and "died of something else."  Discussed pros and cons of testing. He declined testing today.   D/w patient [id number] for colon cancer screening, including IFOB vs. colonoscopy.  Risks and benefits of both were discussed and patient voiced understanding.  Pt elects for: IFOB.   Vaccines d/w pt. Encouraged. He'll check his last tdap date.  Flu done 2014. Shingle and PNA shot d/w pt.   Diet and exercise d/w pt.  Working on low carb diet.  Living will.  Wife designated if incapacitated.   Nightmares improved.  Sleeping well.  His ear ringing continues.    Diabetes:  Using medications without difficulties: yes Hypoglycemic episodes:very rare, single nocturnal episode recalled. Hyperglycemic episodes:no Feet problems: some tingling, at baseline.  Blood Sugars averaging:~120-140 in AM eye exam within last year:yes  Hypertension:    Using medication without problems: yes Chest pain with exertion:no Edema:no Short of breath: no  Elevated Cholesterol: Using medications without problems:yes Muscle aches: no Diet compliance:yes Exercise:yes  CKD followed by neph and improved on last set of labs. Reviewed.   PMH and SH reviewed.   Vital signs, Meds and allergies reviewed.  ROS: See HPI.  Otherwise nontributory.   GEN: nad, alert and oriented HEENT: mucous membranes moist NECK: supple w/o LA CV: rrr. PULM: ctab, no inc wob ABD: soft, +bs EXT: no edema SKIN: no acute rash  Diabetic foot exam: Normal inspection No skin breakdown No calluses  Normal DP pulses Normal sensation to light tough and monofilament Nails normal

## 2013-06-10 NOTE — Patient Instructions (Addendum)
Go to the lab on the way out.  We'll contact you with your lab report. Check with your insurance to see if they will cover the shingles shot. Recheck in about 6 months.  Don't change your meds for now.  Glad to see you.

## 2013-06-12 DIAGNOSIS — Z0000 Encounter for general adult medical examination without abnormal findings: Secondary | ICD-10-CM | POA: Insufficient documentation

## 2013-06-12 NOTE — Assessment & Plan Note (Signed)
Controlled, no change in meds. Labs d/w pt.

## 2013-06-12 NOTE — Assessment & Plan Note (Signed)
Labs d/w pt.  At improved baseline now.

## 2013-06-12 NOTE — Assessment & Plan Note (Signed)
Very rare now, essentially resolved per patient.

## 2013-06-12 NOTE — Assessment & Plan Note (Signed)
PNA vaccine today.  A1c stable, no change in meds.  Doing well overall.

## 2013-06-12 NOTE — Assessment & Plan Note (Signed)
Routine anticipatory guidance given to patient.  See health maintenance. I didn't know until today about this FH prostate cancer. He father "had it for years" and "died of something else."  Discussed pros and cons of testing. He declined testing today.   D/w patient [id number] for colon cancer screening, including IFOB vs. colonoscopy.  Risks and benefits of both were discussed and patient voiced understanding.  Pt elects for: IFOB.   Vaccines d/w pt. Encouraged. He'll check his last tdap date.  Flu done 2014. Shingle and PNA shot d/w pt.   Diet and exercise d/w pt.  Working on low carb diet.  Living will.  Wife designated if incapacitated.

## 2013-06-12 NOTE — Assessment & Plan Note (Signed)
Appears euvolemic, continue as is without med changes.

## 2013-06-12 NOTE — Assessment & Plan Note (Signed)
Controlled.  

## 2013-06-12 NOTE — Assessment & Plan Note (Signed)
Controlled, no ADE on med, continue BZD prn qhs.

## 2013-06-14 ENCOUNTER — Telehealth: Payer: Self-pay

## 2013-06-14 NOTE — Telephone Encounter (Signed)
Relevant patient education assigned to patient using Emmi. ° °

## 2013-06-15 ENCOUNTER — Encounter: Payer: Self-pay | Admitting: Cardiology

## 2013-06-15 ENCOUNTER — Ambulatory Visit (INDEPENDENT_AMBULATORY_CARE_PROVIDER_SITE_OTHER): Payer: Medicare HMO | Admitting: Cardiology

## 2013-06-15 VITALS — BP 139/80 | HR 82 | Ht 68.0 in | Wt 237.1 lb

## 2013-06-15 DIAGNOSIS — I25810 Atherosclerosis of coronary artery bypass graft(s) without angina pectoris: Secondary | ICD-10-CM

## 2013-06-15 DIAGNOSIS — E785 Hyperlipidemia, unspecified: Secondary | ICD-10-CM

## 2013-06-15 DIAGNOSIS — R0602 Shortness of breath: Secondary | ICD-10-CM

## 2013-06-15 DIAGNOSIS — G4733 Obstructive sleep apnea (adult) (pediatric): Secondary | ICD-10-CM

## 2013-06-15 DIAGNOSIS — I739 Peripheral vascular disease, unspecified: Secondary | ICD-10-CM

## 2013-06-15 DIAGNOSIS — I5022 Chronic systolic (congestive) heart failure: Secondary | ICD-10-CM

## 2013-06-15 MED ORDER — LISINOPRIL 10 MG PO TABS: 10.0000 mg | ORAL_TABLET | Freq: Every day | ORAL | Status: DC

## 2013-06-15 NOTE — Patient Instructions (Signed)
Your physician has recommended you make the following change in your medication: 1. Increase Lisinopril to 10 MG 1 tablet daily  Your physician recommends that you return for lab work in: Two weeks on 06/29/13 for a BNP and BMET  Your physician has requested that you have an ankle brachial index (ABI). During this test an ultrasound and blood pressure cuff are used to evaluate the arteries that supply the arms and legs with blood. Allow thirty minutes for this exam. There are no restrictions or special instructions. Schedule for May 2015  Your physician recommends that you schedule a follow-up appointment in: 3 months in the CHF Clinic

## 2013-06-16 NOTE — Progress Notes (Signed)
Patient ID: Kal Chait, male   DOB: 07-18-1934, 79 y.o.   MRN: [id number] PCP: Dr. Damita Dunnings  79 yo with history of CAD s/p CABG, ischemic cardiomyopathy/systolic CHF, LBBB, COPD, and CKD presents for cardiology followup.  He had CABG in 2008.  Last cath in 3/12 showed patent grafts.  EF was 20-25%.  He was admitted in 10/13 with acute on chronic systolic CHF.  He was volume overloaded. He had a mild TnI elevation with no chest pain.  He was diuresed and discharged.  After that, his ICD was upgraded to a Medtronic CRT-D system.  EF improved to 35-40% on echo in 10/14.   Since last appointment, he has done well.  Weight is stable.  No dyspnea walking.  No chest pain.  He is using his CPAP at night.  He has PAD but denies any significant claudication at this time. LDL better now that he is back on Crestor.  Last creatinine was stable at 1.6.    ECG: a-paced, BiV-paced  Labs (10/13): K 3.6 => 4.2, creatinine 1.79 => 2.1 => 2.0, HDL 45, LDL 157, BNP 219 Labs (12/13): K 4, creatinine 1.9, BNP 209 Labs (1/14): LDL 61, HDL 46, LFTs normal Labs (2/14): BNP 199 Labs (3/14): K 4.1, creatinine 2.0 Labs (5/14): K 4.9, creatinine 3.0 Labs (8/14): K 4.2, creatinine 1.84 Labs (10/14): K 3.8, creatinine 1.7 Labs (2/15): K 3.6, creatinine 1.6, LDL 84, HDL 38  PMH: 1. CAD: S/p CABG 2008.  Cath 3/12 with totally occluded mLAD and RCA, 80% LCx stenosis, patent LIMA-LAD, patent SVG-OM, 40% stenosis in SVG-PDA.   2. Ischemic cardiomyopathy: Echo (10/13) with severely dilated LV, moderate LVH, EF 20-25%, mild MR.  Medtronic CRT-D device 10/13.  Echo (10/14) with severely dilated LV, mild LVH, EF 35-40% (improved).  3. LBBB 4. COPD 5. Type II diabetes 6. Hyperlipidemia 7. HTN 8. CKD 9. Obesity 10. OA: knees. 11. PTSD 12. H/o ETOH abuse 13. OSA: Severe by sleep study, on CPAP.  14. PAD: ABIs (5/14) with 0.75 right, 0.80 left.   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   FH: Father with MI at 63  ROS: All systems reviewed and negative except as per HPI.   Current Outpatient Prescriptions  Medication Sig Dispense Refill  . aspirin 81 MG tablet Take 81 mg by mouth every other day.       . carvedilol (COREG) 25 MG tablet TAKE 1& 1/2 TABLETS BY MOUTH TWICE DAILY  90 tablet  5  . diazepam (VALIUM) 10 MG tablet TAKE 1/2 TO 1 TABLET AT BEDTIME AS NEEDED FOR ANXIETY OR RINGING IN EARS  30 tablet  1  . Flaxseed, Linseed, (FLAX SEEDS) POWD Take 5 mLs by mouth every other day.       Marland Kitchen glucose blood (ONE TOUCH TEST STRIPS) test strip Use as instructed to check blood sugars 3 times per day  300 each  12  . insulin aspart (NOVOLOG FLEXPEN) 100 UNIT/ML SOPN FlexPen 10-12 ac      . insulin detemir (LEVEMIR) 100 UNIT/ML injection Inject 25 Units into the skin every morning. Add 1 unit every day until AM sugar is less than 150      . Liraglutide (VICTOZA) 18 MG/3ML SOPN Inject 1.2 mg into the skin daily.  2 pen  5  . lisinopril (PRINIVIL,ZESTRIL) 10 MG tablet Take 1 tablet (10 mg total) by mouth daily.  30 tablet  5  . nitroGLYCERIN (NITROSTAT)  0.4 MG SL tablet Place 1 tablet (0.4 mg total) under the tongue every 5 (five) minutes as needed. For chest pain  25 tablet  11  . rosuvastatin (CRESTOR) 40 MG tablet Take 1 tablet (40 mg total) by mouth daily.  30 tablet  3  . torsemide (DEMADEX) 20 MG tablet Take 2 tablets (40 mg total) by mouth daily.  60 tablet  3   No current facility-administered medications for this visit.    BP 139/80  Pulse 82  Ht 5\' 8"  (1.727 m)  Wt 107.557 kg (237 lb 1.9 oz)  BMI 36.06 kg/m2 General: NAD, obese Neck: Thick, no JVD, no thyromegaly or thyroid nodule.  Lungs: Clear to auscultation bilaterally with normal respiratory effort. CV: Nondisplaced PMI.  Heart regular S1/S2, no S3/S4, no murmur.  No edema.  No carotid bruit.  Difficult to palpate PT pulses.   Abdomen: Soft, nontender, no hepatosplenomegaly, no  distention.  Neurologic: Alert and oriented x 3.  Psych: Normal affect. Extremities: No clubbing or cyanosis.   Assessment/Plan: 1. Chronic systolic CHF: EF 37-10% by last echo (improved).  Status post CRT-D upgrade. NYHA class II symptoms with main limitation from his knee pain (improved).  He does not appear volume overloaded on exam.   - Continue current Coreg and torsemide.   - As creatinine has been stable, I will increase lisinopril to 10 mg daily. Need BMET/BNP in 10 days.    2. CAD: s/p CABG.  No recent chest pain.  Last cath in 3/12 with patent grafts.  He had a mild elevation in troponin during 10/13 admission with acute on chronic systolic CHF.  I think that this was most likely demand ischemia in the setting of significant volume overload.  We held off on cath given concern for demand ischemia and CKD.  - Continue ASA 81, Plavix, statin, and Coreg.   3. Hyperlipidemia: Back on Crestor, LDL acceptable.     4. OSA: Severe OSA, on CPAP.  5. CKD: Creatinine better recently.  BMET in 10 days after increasing lisinopril to 10 mg daily.  6. PAD: Claudication improved with weight loss.  Not candidate for cilostazol given CHF.   Repeat ABIs in 5/15.   Followup in 3 months in CHF clinic.   [name] 06/16/2013

## 2013-06-18 ENCOUNTER — Other Ambulatory Visit: Payer: Self-pay | Admitting: Family Medicine

## 2013-06-20 NOTE — Telephone Encounter (Signed)
Medication phoned to pharmacy.  

## 2013-06-20 NOTE — Telephone Encounter (Signed)
Electronic refill request.  Please advise. 

## 2013-06-20 NOTE — Telephone Encounter (Signed)
Please call in.  Thanks.   

## 2013-06-25 ENCOUNTER — Other Ambulatory Visit: Payer: Self-pay | Admitting: Family Medicine

## 2013-06-28 ENCOUNTER — Encounter: Payer: Self-pay | Admitting: *Deleted

## 2013-06-29 ENCOUNTER — Other Ambulatory Visit: Payer: Medicare HMO

## 2013-06-29 ENCOUNTER — Other Ambulatory Visit (INDEPENDENT_AMBULATORY_CARE_PROVIDER_SITE_OTHER): Payer: Medicare HMO

## 2013-06-29 DIAGNOSIS — R0602 Shortness of breath: Secondary | ICD-10-CM

## 2013-06-29 DIAGNOSIS — I5022 Chronic systolic (congestive) heart failure: Secondary | ICD-10-CM

## 2013-06-29 LAB — BASIC METABOLIC PANEL
BUN: 27 mg/dL — ABNORMAL HIGH (ref 6–23)
CHLORIDE: 99 meq/L (ref 96–112)
CO2: 24 mEq/L (ref 19–32)
CREATININE: 1.7 mg/dL — AB (ref 0.4–1.5)
Calcium: 9.1 mg/dL (ref 8.4–10.5)
GFR: 40.69 mL/min — ABNORMAL LOW (ref >60.00)
Glucose, Bld: 244 mg/dL — ABNORMAL HIGH (ref 70–99)
Potassium: 3.4 mEq/L — ABNORMAL LOW (ref 3.5–5.1)
Sodium: 134 mEq/L — ABNORMAL LOW (ref 135–145)

## 2013-06-29 LAB — BRAIN NATRIURETIC PEPTIDE: PRO B NATRI PEPTIDE: 268 pg/mL — AB (ref 0.0–100.0)

## 2013-07-12 ENCOUNTER — Encounter: Payer: Self-pay | Admitting: *Deleted

## 2013-07-12 ENCOUNTER — Encounter: Payer: Medicare HMO | Admitting: Internal Medicine

## 2013-07-15 ENCOUNTER — Other Ambulatory Visit: Payer: Self-pay | Admitting: *Deleted

## 2013-07-15 MED ORDER — LIRAGLUTIDE 18 MG/3ML ~~LOC~~ SOPN: 1.2000 mg | PEN_INJECTOR | Freq: Every day | SUBCUTANEOUS | Status: DC

## 2013-08-04 ENCOUNTER — Encounter: Payer: Self-pay | Admitting: Internal Medicine

## 2013-08-04 ENCOUNTER — Ambulatory Visit (INDEPENDENT_AMBULATORY_CARE_PROVIDER_SITE_OTHER): Payer: Medicare HMO | Admitting: Internal Medicine

## 2013-08-04 VITALS — BP 132/70 | HR 90 | Ht 68.0 in | Wt 243.0 lb

## 2013-08-04 DIAGNOSIS — I509 Heart failure, unspecified: Secondary | ICD-10-CM

## 2013-08-04 DIAGNOSIS — I5023 Acute on chronic systolic (congestive) heart failure: Secondary | ICD-10-CM

## 2013-08-04 DIAGNOSIS — I2589 Other forms of chronic ischemic heart disease: Secondary | ICD-10-CM

## 2013-08-04 DIAGNOSIS — I428 Other cardiomyopathies: Secondary | ICD-10-CM

## 2013-08-04 LAB — MDC_IDC_ENUM_SESS_TYPE_INCLINIC
Battery Voltage: 3.09 V
Brady Statistic AP VP Percent: 0.16 %
Brady Statistic AP VS Percent: 0.01 %
Brady Statistic AS VP Percent: 99.56 %
Brady Statistic RA Percent Paced: 0.17 %
Brady Statistic RV Percent Paced: 99.72 %
Date Time Interrogation Session: 20150430114908
HighPow Impedance: 209 Ohm
HighPow Impedance: 380 Ohm
HighPow Impedance: 43 Ohm
HighPow Impedance: 54 Ohm
Lead Channel Impedance Value: 399 Ohm
Lead Channel Impedance Value: 437 Ohm
Lead Channel Impedance Value: 494 Ohm
Lead Channel Impedance Value: 703 Ohm
Lead Channel Pacing Threshold Amplitude: 0.75 V
Lead Channel Pacing Threshold Pulse Width: 0.4 ms
Lead Channel Pacing Threshold Pulse Width: 0.4 ms
Lead Channel Sensing Intrinsic Amplitude: 2.875 mV
Lead Channel Setting Pacing Amplitude: 2.5 V
Lead Channel Setting Pacing Pulse Width: 0.4 ms
Lead Channel Setting Sensing Sensitivity: 0.3 mV
MDC IDC MSMT LEADCHNL LV IMPEDANCE VALUE: 399 Ohm
MDC IDC MSMT LEADCHNL LV PACING THRESHOLD AMPLITUDE: 0.75 V
MDC IDC MSMT LEADCHNL LV PACING THRESHOLD PULSEWIDTH: 0.4 ms
MDC IDC MSMT LEADCHNL RA PACING THRESHOLD AMPLITUDE: 0.5 V
MDC IDC MSMT LEADCHNL RV SENSING INTR AMPL: 24.25 mV
MDC IDC SET LEADCHNL LV PACING AMPLITUDE: 2.5 V
MDC IDC SET LEADCHNL RA PACING AMPLITUDE: 2 V
MDC IDC SET LEADCHNL RV PACING PULSEWIDTH: 0.4 ms
MDC IDC SET ZONE DETECTION INTERVAL: 340 ms
MDC IDC SET ZONE DETECTION INTERVAL: 400 ms
MDC IDC STAT BRADY AS VS PERCENT: 0.27 %
Zone Setting Detection Interval: 290 ms
Zone Setting Detection Interval: 350 ms

## 2013-08-04 NOTE — Progress Notes (Signed)
Patient Care Team: Tonia Ghent, MD as PCP - General (Family Medicine) Larey Dresser, MD as Referring Physician (Cardiology)   HPI  [name] [name] is a 79 y.o. male Seen in followup for CRT-D. implanted for ischemic heart myopathy and nonsustained ventricular tachycardia he has a history of left bundle branch blockthis . Catheterization 3/12 demonstrated patent grafts. Ejection fraction 20-25%.there is interval improvement in his ejection fraction to 35-40% 10/14.  Is obstructive sleep apnea treated with CPAP.  The patient denies chest pain, shortness of breath, nocturnal dyspnea, orthopnea or peripheral edema.  There have been no palpitations, lightheadedness or syncope.      Past Medical History  Diagnosis Date  . COPD (chronic obstructive pulmonary disease) 1993  . Hyperlipidemia 1993  . Hypertension 1993  . CAD (coronary artery disease)     bypass graft surgery 05/2006; grafts were widely patent on cath 06/2010   . Ischemic cardiomyopathy     EF 25-30%; s/p single chamber ICD 2010; upgrade to Medtronic BiV ICD 01/30/12  . Systolic heart failure     class II  . Foot fracture 1953; 1962    left; left  . Chronic kidney disease   . Myocardial infarction 2008; 01/06/2012  . Syncope and collapse 01/05/2012  . Shortness of breath 01/05/2012    "all the time; been going on for awhile"  . Diabetes mellitus, type 2 2000  . Arthritis     "hands" (01/06/2012)  . Elbow mass     left; "just noticed it ~ 2 wk ago"  (01/06/2012)  . Anxiety   . Nightmares     with psych eval 2012 at [facility]  . PTSD (post-traumatic stress disorder)   . Tinnitus of left ear   . 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 Boil     "fluid boil left arm" (01/30/2012)  . LBBB (left bundle branch block)     Past Surgical History  Procedure Laterality Date  . Coronary artery bypass graft  2008    3 vessel, class 4 CHF, non Q wave MI 05/19/06  . Appendectomy  1957  . Cardiac defibrillator placement  01/30/2012    biventricular ICD  . Fracture surgery  1953; ~ 1962    left; left  . Cardiac defibrillator placement  01/30/2012    biventricular ICD  . Insert / replace / remove pacemaker  01/30/2012    biventricular ICD placed    Current Outpatient Prescriptions  Medication Sig Dispense Refill  . aspirin 81 MG tablet Take 81 mg by mouth every other day.       . carvedilol (COREG) 25 MG tablet TAKE 1& 1/2 TABLETS BY MOUTH TWICE DAILY  90 tablet  5  . diazepam (VALIUM) 10 MG tablet TAKE 1/2 TO 1 TABLET AT BEDTIME AS NEEDED FOR ANXIETY OR RINGING IN EARS  30 tablet  1  . Flaxseed, Linseed, (FLAX SEEDS) POWD Take 5 mLs by mouth every other day.       Marland Kitchen glucose blood (ONE TOUCH TEST STRIPS) test strip Use as instructed to check blood sugars 3 times per day  300 each  12  . insulin aspart (NOVOLOG FLEXPEN) 100 UNIT/ML SOPN FlexPen 10-12 ac      . insulin detemir (LEVEMIR) 100 UNIT/ML injection Inject 25-27 Units into the skin every morning. Add 1 unit every day until AM sugar is less than 150      . Liraglutide (VICTOZA) 18 MG/3ML SOPN Inject 1.2 mg into  the skin daily.  2 pen  5  . lisinopril (PRINIVIL,ZESTRIL) 10 MG tablet Take 1 tablet (10 mg total) by mouth daily.  30 tablet  5  . nitroGLYCERIN (NITROSTAT) 0.4 MG SL tablet Place 1 tablet (0.4 mg total) under the tongue every 5 (five) minutes as needed. For chest pain  25 tablet  11  . rosuvastatin (CRESTOR) 40 MG tablet Take 1 tablet (40 mg total) by mouth daily.  30 tablet  3  . torsemide (DEMADEX) 20 MG tablet Take 2 tablets (40 mg total) by mouth daily.  60 tablet  3   No current facility-administered medications for this visit.    Allergies  Allergen Reactions  . Sulfa Antibiotics Swelling and Rash    Hands swell    Review of Systems negative except from HPI and PMH  Physical Exam BP 132/70  Pulse 90  Ht 5\' 8"  (1.727 m)  Wt 243 lb (110.224 kg)  BMI 36.96 kg/m2 Well developed and well nourished in no acute distress HENT normal E  scleral and icterus clear Neck Supple JVP flat; carotids brisk and full Clear to ausculation Device pocket well healed; without hematoma or erythema.  There is no tethering \*Regular rate and rhythm, no murmurs gallops or rub Soft with active bowel sounds No clubbing cyanosis  Edema Alert and oriented, grossly normal motor and sensory function Skin Warm and Dry    Assessment and  Plan  Ischemic cardiac myopathy  Congestive heart failure-chronic systolic  CRT-D-Medtronic  The patient's device was interrogated.  The information was reviewed. No changes were made in the programming.    Stable continue currrent meds

## 2013-08-04 NOTE — Patient Instructions (Signed)
Your physician wants you to follow-up in: 1 year with Dr. Caryl Comes. You will receive a reminder letter in the mail two months in advance. If you don't receive a letter, please call our office to schedule the follow-up appointment.  Your physician recommends that you schedule a follow-up appointment in:  3 months [facility]

## 2013-08-16 ENCOUNTER — Ambulatory Visit (HOSPITAL_COMMUNITY): Payer: Medicare HMO | Attending: Cardiology | Admitting: Cardiology

## 2013-08-16 DIAGNOSIS — R209 Unspecified disturbances of skin sensation: Secondary | ICD-10-CM

## 2013-08-16 DIAGNOSIS — I739 Peripheral vascular disease, unspecified: Secondary | ICD-10-CM

## 2013-08-16 DIAGNOSIS — E1165 Type 2 diabetes mellitus with hyperglycemia: Secondary | ICD-10-CM

## 2013-08-16 DIAGNOSIS — E119 Type 2 diabetes mellitus without complications: Secondary | ICD-10-CM | POA: Insufficient documentation

## 2013-08-16 DIAGNOSIS — I10 Essential (primary) hypertension: Secondary | ICD-10-CM | POA: Insufficient documentation

## 2013-08-16 DIAGNOSIS — I2510 Atherosclerotic heart disease of native coronary artery without angina pectoris: Secondary | ICD-10-CM | POA: Insufficient documentation

## 2013-08-16 DIAGNOSIS — Z951 Presence of aortocoronary bypass graft: Secondary | ICD-10-CM | POA: Insufficient documentation

## 2013-08-16 DIAGNOSIS — J449 Chronic obstructive pulmonary disease, unspecified: Secondary | ICD-10-CM | POA: Insufficient documentation

## 2013-08-16 DIAGNOSIS — J4489 Other specified chronic obstructive pulmonary disease: Secondary | ICD-10-CM | POA: Insufficient documentation

## 2013-08-16 DIAGNOSIS — E785 Hyperlipidemia, unspecified: Secondary | ICD-10-CM | POA: Insufficient documentation

## 2013-08-16 DIAGNOSIS — E1129 Type 2 diabetes mellitus with other diabetic kidney complication: Secondary | ICD-10-CM

## 2013-08-16 NOTE — Progress Notes (Signed)
Single level LEA Doppler/ABI performed

## 2013-08-18 ENCOUNTER — Other Ambulatory Visit: Payer: Self-pay

## 2013-08-18 MED ORDER — TORSEMIDE 20 MG PO TABS: 40.0000 mg | ORAL_TABLET | Freq: Every day | ORAL | Status: DC

## 2013-08-25 ENCOUNTER — Other Ambulatory Visit (INDEPENDENT_AMBULATORY_CARE_PROVIDER_SITE_OTHER): Payer: Medicare HMO

## 2013-08-25 DIAGNOSIS — E1129 Type 2 diabetes mellitus with other diabetic kidney complication: Secondary | ICD-10-CM

## 2013-08-25 DIAGNOSIS — E1165 Type 2 diabetes mellitus with hyperglycemia: Principal | ICD-10-CM

## 2013-08-25 LAB — BASIC METABOLIC PANEL
BUN: 21 mg/dL (ref 6–23)
CO2: 27 meq/L (ref 19–32)
CREATININE: 1.7 mg/dL — AB (ref 0.4–1.5)
Calcium: 9.2 mg/dL (ref 8.4–10.5)
Chloride: 102 mEq/L (ref 96–112)
GFR: 43.24 mL/min — ABNORMAL LOW (ref >60.00)
GLUCOSE: 100 mg/dL — AB (ref 70–99)
Potassium: 3.8 mEq/L (ref 3.5–5.1)
Sodium: 137 mEq/L (ref 135–145)

## 2013-08-25 LAB — HEMOGLOBIN A1C: HEMOGLOBIN A1C: 7.8 % — AB (ref 4.6–6.5)

## 2013-08-30 ENCOUNTER — Other Ambulatory Visit: Payer: Self-pay | Admitting: *Deleted

## 2013-08-30 ENCOUNTER — Encounter: Payer: Self-pay | Admitting: Endocrinology

## 2013-08-30 ENCOUNTER — Ambulatory Visit (INDEPENDENT_AMBULATORY_CARE_PROVIDER_SITE_OTHER): Payer: Medicare HMO | Admitting: Endocrinology

## 2013-08-30 VITALS — BP 122/70 | HR 83 | Temp 98.1°F | Resp 14 | Ht 68.0 in | Wt 241.8 lb

## 2013-08-30 DIAGNOSIS — E1165 Type 2 diabetes mellitus with hyperglycemia: Principal | ICD-10-CM

## 2013-08-30 DIAGNOSIS — I10 Essential (primary) hypertension: Secondary | ICD-10-CM

## 2013-08-30 DIAGNOSIS — E785 Hyperlipidemia, unspecified: Secondary | ICD-10-CM

## 2013-08-30 DIAGNOSIS — E1129 Type 2 diabetes mellitus with other diabetic kidney complication: Secondary | ICD-10-CM

## 2013-08-30 MED ORDER — GLUCOSE BLOOD VI STRP: ORAL_STRIP | Status: DC

## 2013-08-30 MED ORDER — ONETOUCH DELICA LANCETS FINE MISC: Status: AC

## 2013-08-30 NOTE — Progress Notes (Signed)
Patient ID: [name] [name], male   DOB: 1934/05/12, 79 y.o.   MRN: [id number]   Reason for Appointment: Diabetes follow-up   History of Present Illness   Diagnosis: Type 2 DIABETES MELITUS, date of diagnosis: 2002   He has been on insulin since about 2008 with variable control. In 04/2012 his A1c was 10.1% His diabetes control was significantly better with adding Victoza in early 2014 and adjusting his dose of Levemir Also was able to take lower doses of Levemir than before  Recent history:  His A1c appears to be higher and he has markedly increased blood sugars after meals. However he has checked only a few readings after meals despite discussions on the previous visit Also has not increased his mealtime insulin as directed on the last visit. He is not concerned about his blood sugars going up to over 300 after meals Also his fasting blood sugars are mostly high He is trying to recently start an exercise regimen but has not lost any weight He says he is going to start a weight management and exercise program at an unknown location but without supervision. Even though he has started this last week his blood sugars are still high in the morning He is compliant with Victoza and he thinks he is taking is NovoLog when he is eating consistently  Oral hypoglycemic drugs: None         Side effects from medications: None Insulin regimen: Levemir 27 units daily, NovoLog 10-12  units before meals       Proper timing of medications in relation to meals: Yes.         Monitors blood glucose: Once a day.    Glucometer: One Touch           Blood Glucose readings from meter download as follows, his monitor is about 6 hours behind the current time:   PREMEAL Breakfast  p.m.  Dinner Bedtime Overall  Glucose range:  130-220   214-362      Mean/median:        Hypoglycemia frequency:  none recently.           Meals: 3 meals per day.eating breakfast at 11 am with either oatmeal or apple/peanut butter;  lunch 3 PM dinner 6-7 pm         Physical activity: exercise: Walking and at gym           Dietician visit: Most recent: 1761           Complications: are: ? Nephropathy     Wt Readings from Last 3 Encounters:  08/30/13 241 lb 12.8 oz (109.68 kg)  08/04/13 243 lb (110.224 kg)  06/15/13 237 lb 1.9 oz (107.557 kg)   Lab Results  Component Value Date   HGBA1C 7.8* 08/25/2013   HGBA1C 7.1* 05/11/2013   HGBA1C 6.9* 01/25/2013   Lab Results  Component Value Date   MICROALBUR 9.6* 01/25/2013   LDLCALC 84 05/11/2013   CREATININE 1.7* 08/25/2013       Medication List       This list is accurate as of: 08/30/13 11:15 AM.  Always use your most recent med list.               aspirin 81 MG tablet  Take 81 mg by mouth every other day.     carvedilol 25 MG tablet  Commonly known as:  COREG  TAKE 1& 1/2 TABLETS BY MOUTH TWICE DAILY     diazepam 10 MG tablet  Commonly known as:  VALIUM  TAKE 1/2 TO 1 TABLET AT BEDTIME AS NEEDED FOR ANXIETY OR RINGING IN EARS     Flax Seeds Powd  Take 5 mLs by mouth every other day.     glucose blood test strip  Commonly known as:  ONE TOUCH TEST STRIPS  Use as instructed to check blood sugars 3 times per day     insulin detemir 100 UNIT/ML injection  Commonly known as:  LEVEMIR  Inject 25-27 Units into the skin every morning. Add 1 unit every day until AM sugar is less than 150     Liraglutide 18 MG/3ML Sopn  Commonly known as:  VICTOZA  Inject 1.2 mg into the skin daily.     lisinopril 10 MG tablet  Commonly known as:  PRINIVIL,ZESTRIL  Take 1 tablet (10 mg total) by mouth daily.     nitroGLYCERIN 0.4 MG SL tablet  Commonly known as:  NITROSTAT  Place 1 tablet (0.4 mg total) under the tongue every 5 (five) minutes as needed. For chest pain     NOVOLOG FLEXPEN 100 UNIT/ML FlexPen  Generic drug:  insulin aspart  10-12 ac     rosuvastatin 40 MG tablet  Commonly known as:  CRESTOR  Take 1 tablet (40 mg total) by mouth daily.      torsemide 20 MG tablet  Commonly known as:  DEMADEX  Take 2 tablets (40 mg total) by mouth daily.        Allergies:  Allergies  Allergen Reactions  . Sulfa Antibiotics Swelling and Rash    Hands swell    Past Medical History  Diagnosis Date  . COPD (chronic obstructive pulmonary disease) 1993  . Hyperlipidemia 1993  . Hypertension 1993  . CAD (coronary artery disease)     bypass graft surgery 05/2006; grafts were widely patent on cath 06/2010   . Ischemic cardiomyopathy     EF 25-30%; s/p single chamber ICD 2010; upgrade to Medtronic BiV ICD 01/30/12  . Systolic heart failure     class II  . Foot fracture 1953; 1962    left; left  . Chronic kidney disease   . Myocardial infarction 2008; 01/06/2012  . Syncope and collapse 01/05/2012  . Shortness of breath 01/05/2012    "all the time; been going on for awhile"  . Diabetes mellitus, type 2 2000  . Arthritis     "hands" (01/06/2012)  . Elbow mass     left; "just noticed it ~ 2 wk ago"  (01/06/2012)  . Anxiety   . Nightmares     with psych eval 2012 at [facility]  . PTSD (post-traumatic stress disorder)   . Tinnitus of left ear   . 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 Boil     "fluid boil left arm" (01/30/2012)  . LBBB (left bundle branch block)     Past Surgical History  Procedure Laterality Date  . Coronary artery bypass graft  2008    3 vessel, class 4 CHF, non Q wave MI 05/19/06  . Appendectomy  1957  . Cardiac defibrillator placement  01/30/2012    biventricular ICD  . Fracture surgery  1953; ~ 1962    left; left  . Cardiac defibrillator placement  01/30/2012    biventricular ICD  . Insert / replace / remove pacemaker  01/30/2012    biventricular ICD placed    Family History  Problem Relation Age of Onset  . Heart disease Father  MI age 46  . Prostate cancer Father   . Diabetes Sister   . Hyperlipidemia Sister   . Cancer Maternal Grandmother     liver, cirrhosis  . Depression Neg Hx   . Alcohol abuse Neg Hx   . Drug  abuse Neg Hx   . Stroke Neg Hx   . Colon cancer Neg Hx     Social History:  reports that he has quit smoking. His smoking use included Cigarettes. He has a 17.5 pack-year smoking history. He has never used smokeless tobacco. He reports that he drinks about 4.2 ounces of alcohol per week. He reports that he does not use illicit drugs.  Review of Systems:  Has history of coronary artery disease  Hypertension treated with Coreg and lisinopril low dose with good control  HYPERLIPIDEMIA:  Currently taking Crestor 40 mg but his LDL is still over 70  Lab Results  Component Value Date   CHOL 144 05/11/2013   HDL 38.40* 05/11/2013   LDLCALC 84 05/11/2013   LDLDIRECT 163.3 01/25/2013   TRIG 109.0 05/11/2013   CHOLHDL 4 05/11/2013   His creatinine is relatively stable     Examination:   BP 122/70  Pulse 83  Temp(Src) 98.1 F (36.7 C)  Resp 14  Ht 5\' 8"  (1.727 m)  Wt 241 lb 12.8 oz (109.68 kg)  BMI 36.77 kg/m2  SpO2 92%  Body mass index is 36.77 kg/(m^2).    ASSESSMENT/ PLAN:   Diabetes type 2   His blood sugars are overall worse with increasing A1c Also has marked increase in postprandial blood sugars as discussed in history of present illness Problems identified and discussed today:  Not taking enough insulin to cover his meals with postprandial readings sometimes over 300  Although he he is compliant he may not be getting his mealtime insulin consistently  Checking postprandial readings only sporadically  Has poor understanding of diabetes, blood sugar targets and glucose monitoring somewhat erratic in the morning and even though his average glucose at home is about 180 his A1c seems lower than expected again   Still somewhat insulin resistant even with taking Victoza  PLAN:   He will increase his mealtime insulin by 4-6 units, more with larger meals units, discussed postprandial targets of at least less than 180  Increase Levemir to 30 units and more if his fasting blood  sugars are consistently high, discussed fasting glucose targets  To call if blood sugars are getting significantly lower with his new diet and exercise program  More inconsistent diet  More blood sugars after breakfast and lunch to help adjust mealtime coverage  Consider increasing the dose of Victoza if he does not lose weight with his planned meal replacement program  CKD: Creatinine 1.7, stable, to continue followup with nephrologist  HYPERLIPIDEMIA: Taking high dose statin drug and will continue  Counseling time over 50% of today's 25 minute visit  Elayne Snare 08/30/2013, 11:15 AM

## 2013-08-30 NOTE — Patient Instructions (Addendum)
LEVEMIR 30 UNITS DAILY  NOVOLOG 14 UNITS WITH SMALL meals and 16-18 units with larger meals Sugar should be < 160 after meals  More sugars 2 hrs after meals

## 2013-10-04 ENCOUNTER — Ambulatory Visit (HOSPITAL_COMMUNITY): Admission: RE | Admit: 2013-10-04 | Payer: Medicare HMO | Source: Ambulatory Visit

## 2013-10-13 ENCOUNTER — Ambulatory Visit (HOSPITAL_COMMUNITY)
Admission: RE | Admit: 2013-10-13 | Discharge: 2013-10-13 | Disposition: A | Discharge status: Home / Self Care | Payer: Medicare HMO | Source: Ambulatory Visit | Attending: Internal Medicine | Admitting: Internal Medicine

## 2013-10-13 VITALS — BP 144/72 | HR 60 | Wt 239.5 lb

## 2013-10-13 DIAGNOSIS — I5022 Chronic systolic (congestive) heart failure: Secondary | ICD-10-CM

## 2013-10-13 DIAGNOSIS — I2510 Atherosclerotic heart disease of native coronary artery without angina pectoris: Secondary | ICD-10-CM | POA: Insufficient documentation

## 2013-10-13 DIAGNOSIS — I5023 Acute on chronic systolic (congestive) heart failure: Secondary | ICD-10-CM

## 2013-10-13 NOTE — Progress Notes (Signed)
Patient ID: [name] [name], male   DOB: October 13, 1934, 79 y.o.   MRN: [id number] PCP: Dr. Damita Dunnings Primary Cardiologist: Dr. Aundra Dubin Nephrologist:   79 yo with history of CAD s/p CABG, ischemic cardiomyopathy/systolic CHF, LBBB, COPD, and CKD presents for cardiology followup.  He had CABG in 2008.  Last cath in 3/12 showed patent grafts.  EF was 20-25%.  He was admitted in 10/13 with acute on chronic systolic CHF.  He was volume overloaded. He had a mild TnI elevation with no chest pain.  He was diuresed and discharged.  After that, his ICD was upgraded to a Medtronic CRT-D system.  EF improved to 35-40% on echo in 10/14.   Follow up for Heart Failure: Last visit increased lisinopril to 10 mg daily which he tolerated. Doing well. Reports that the only issue he is having is he can't afford insulin currently. Denies SOB, orthopnea or CP. Not weighing daily. Going to the gym now and able to go only about 5 minutes and then his legs give out. Not wearing CPAP at night. Not really following a low salt diet and likely drinks more than 2L a day.   Opivol: Fluid below threshold and thoracic impedence up. Patient activity ~ 1-2 hrs a day. No VT/Afib.   Labs (10/13): K 3.6 => 4.2, creatinine 1.79 => 2.1 => 2.0, HDL 45, LDL 157, BNP 219 Labs (12/13): K 4, creatinine 1.9, BNP 209 Labs (1/14): LDL 61, HDL 46, LFTs normal Labs (2/14): BNP 199 Labs (3/14): K 4.1, creatinine 2.0 Labs (5/14): K 4.9, creatinine 3.0 Labs (8/14): K 4.2, creatinine 1.84 Labs (10/14): K 3.8, creatinine 1.7 Labs (2/15): K 3.6, creatinine 1.6, LDL 84, HDL 38 Labs (5/15): K 3.8, creatinine 1.7  PMH: 1. CAD: S/p CABG 2008.  Cath 3/12 with totally occluded mLAD and RCA, 80% LCx stenosis, patent LIMA-LAD, patent SVG-OM, 40% stenosis in SVG-PDA.   2. Ischemic cardiomyopathy: Echo (10/13) with severely dilated LV, moderate LVH, EF 20-25%, mild MR.  Medtronic CRT-D device 10/13.  Echo (10/14) with severely dilated LV, mild LVH, EF 35-40%  (improved).  3. LBBB 4. COPD 5. Type II diabetes 6. Hyperlipidemia 7. HTN 8. CKD 9. Obesity 10. OA: knees. 11. PTSD 12. H/o ETOH abuse 13. OSA: Severe by sleep study, on CPAP.  14. PAD: ABIs (5/14) with 0.75 right, 0.80 left. ABIs (08/2013) with 0.81 left and 0.75 right (stable).  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   FH: Father with MI at 20  ROS: All systems reviewed and negative except as per HPI.   Current Outpatient Prescriptions  Medication Sig Dispense Refill  . aspirin 81 MG tablet Take 81 mg by mouth every other day.       . carvedilol (COREG) 25 MG tablet TAKE 1& 1/2 TABLETS BY MOUTH TWICE DAILY  90 tablet  5  . diazepam (VALIUM) 10 MG tablet TAKE 1/2 TO 1 TABLET AT BEDTIME AS NEEDED FOR ANXIETY OR RINGING IN EARS  30 tablet  1  . glucose blood (ONETOUCH VERIO) test strip Use as instructed to check blood sugar 3 times per day dx code 250.42  100 each  3  . insulin aspart (NOVOLOG FLEXPEN) 100 UNIT/ML SOPN FlexPen 10-12 ac      . insulin detemir (LEVEMIR) 100 UNIT/ML injection Inject 25-27 Units into the skin every morning. Add 1 unit every day until AM sugar is less than 150      . Liraglutide (VICTOZA) 18  MG/3ML SOPN Inject 1.2 mg into the skin daily.  2 pen  5  . lisinopril (PRINIVIL,ZESTRIL) 10 MG tablet Take 1 tablet (10 mg total) by mouth daily.  30 tablet  5  . nitroGLYCERIN (NITROSTAT) 0.4 MG SL tablet Place 1 tablet (0.4 mg total) under the tongue every 5 (five) minutes as needed. For chest pain  25 tablet  11  . ONETOUCH DELICA LANCETS FINE MISC Use to check blood sugar 3 times per day dx code 250.42  100 each  3  . rosuvastatin (CRESTOR) 40 MG tablet Take 1 tablet (40 mg total) by mouth daily.  30 tablet  3  . torsemide (DEMADEX) 20 MG tablet Take 2 tablets (40 mg total) by mouth daily.  60 tablet  3   No current facility-administered medications for this encounter.   Filed Vitals:   10/13/13 1131   BP: 144/72  Pulse: 60  Weight: 239 lb 8 oz (108.636 kg)  SpO2: 97%    General: NAD, obese Neck: Thick, no JVD, no thyromegaly or thyroid nodule.  Lungs: Clear to auscultation bilaterally with normal respiratory effort. CV: Nondisplaced PMI.  Heart regular S1/S2, no S3/S4, no murmur.  No edema.  No carotid bruit.  Difficult to palpate PT pulses.   Abdomen: Soft, nontender, no hepatosplenomegaly, no distention.  Neurologic: Alert and oriented x 3.  Psych: Normal affect. Extremities: No clubbing or cyanosis.   Assessment/Plan:  1. Chronic systolic CHF: EF 36-14% (43/1540), ICM s/p CRT-D upgrade. - NYHA class II-III symptoms and volume status stable. Will continue torsemide 40 mg daily and discussed sliding scale diuretics and if weight goes up more than 3lbs in a day or 5 lbs in a week to take an extra 20 mg of torsemide. - Optivol interrogated and fluid below threshold and thoracic impedence trending up. Activity 1 hr a day. No afib or Vtach - Continue coreg 37.5 mg BID will not titrate currently with HR 60. - Continue lisinopril 10 mg daily. Last K+ and Cr stable. Will continue to follow. - No spiro with CKD. - Reinforced the need and importance of daily weights, a low sodium diet, and fluid restriction (less than 2 L a day). Instructed to call the HF clinic if weight increases more than 3 lbs overnight or 5 lbs in a week.    2. CAD: s/p CABG.  No s/s of ischemia.  Last cath in 3/12 with patent grafts.  He had a mild elevation in troponin during 10/13 admission with acute on chronic systolic CHF.  I think that this was most likely demand ischemia in the setting of significant volume overload.  We held off on cath given concern for demand ischemia and CKD.  - Continue ASA 81, Plavix, statin, and Coreg.   3. Hyperlipidemia: Back on Crestor, LDL acceptable.     4. OSA: Severe OSA. He has not been wearing his CPAP nightly and recommended wearing it nightly. He reports he has been more  fatigued and sleepy during the day.  5. CKD stage III: Last creatinine 1.7 which is stable. Will continue to follow Creatinine better recently.  BMET in 10 days after increasing lisinopril to 10 mg daily.  6. PAD: Not candidate for cilostazol given CHF. ABIs in 5/15 which were stable. Encouraged to continue to walk as much as possible.   Followup in 3 months in CHF clinic.   Junie Bame B NP-C 10/13/2013  Patient seen and examined with Junie Bame, NP. We discussed all aspects of  the encounter. I agree with the assessment and plan as stated above.   Doing fairly well. HF stable. Volume status trending up slightly on Optivol. No AF or VT. Activity level consistently 1hr/day. Reinforced need for daily weights and reviewed use of sliding scale diuretics. Long discussion about need for more physical activity. Will not change meds today.   Daniel Bensimhon,MD 4:00 PM

## 2013-10-13 NOTE — Patient Instructions (Addendum)
Try to increase your exercise as much as possible.  Please weigh daily if your weight goes up more than 3 lbs in a day or 5 lbs in week take an extra torsemide 20 mg that day.  Call any issues [phone]  F/U 3 months  Do the following things EVERYDAY: 1) Weigh yourself in the morning before breakfast. Write it down and keep it in a log. 2) Take your medicines as prescribed 3) Eat low salt foods-Limit salt (sodium) to 2000 mg per day.  4) Stay as active as you can everyday 5) Limit all fluids for the day to less than 2 liters 6)

## 2013-10-24 ENCOUNTER — Other Ambulatory Visit: Payer: Self-pay | Admitting: Family Medicine

## 2013-10-25 ENCOUNTER — Other Ambulatory Visit: Payer: Medicare HMO

## 2013-10-28 ENCOUNTER — Ambulatory Visit: Payer: Medicare HMO | Admitting: Endocrinology

## 2013-11-07 ENCOUNTER — Encounter: Payer: Self-pay | Admitting: Internal Medicine

## 2013-11-14 ENCOUNTER — Other Ambulatory Visit: Payer: Self-pay | Admitting: *Deleted

## 2013-11-14 NOTE — Telephone Encounter (Signed)
Pt requesting medication refill. Last f/u appt 06/2013 with upcoming 12/2013. pls advise

## 2013-11-15 MED ORDER — DIAZEPAM 10 MG PO TABS: ORAL_TABLET | ORAL | Status: DC

## 2013-11-15 NOTE — Telephone Encounter (Signed)
Please call in.  Thanks.   

## 2013-11-15 NOTE — Telephone Encounter (Signed)
Medication phoned to pharmacy.  

## 2013-11-23 ENCOUNTER — Encounter: Payer: Self-pay | Admitting: *Deleted

## 2013-11-24 ENCOUNTER — Encounter: Payer: Self-pay | Admitting: Endocrinology

## 2013-11-24 ENCOUNTER — Ambulatory Visit (INDEPENDENT_AMBULATORY_CARE_PROVIDER_SITE_OTHER): Payer: Medicare HMO | Admitting: Endocrinology

## 2013-11-24 VITALS — BP 132/70 | HR 73 | Temp 98.6°F | Resp 16 | Ht 68.0 in | Wt 239.8 lb

## 2013-11-24 DIAGNOSIS — I10 Essential (primary) hypertension: Secondary | ICD-10-CM

## 2013-11-24 DIAGNOSIS — E1165 Type 2 diabetes mellitus with hyperglycemia: Principal | ICD-10-CM

## 2013-11-24 DIAGNOSIS — N183 Chronic kidney disease, stage 3 unspecified: Secondary | ICD-10-CM

## 2013-11-24 DIAGNOSIS — E785 Hyperlipidemia, unspecified: Secondary | ICD-10-CM

## 2013-11-24 DIAGNOSIS — E1129 Type 2 diabetes mellitus with other diabetic kidney complication: Secondary | ICD-10-CM

## 2013-11-24 NOTE — Patient Instructions (Addendum)
Please check blood sugars at least half the time about 2 hours after any meal and 3 times per week on waking up. Please bring blood sugar monitor to each visit  NOVOLOG 14 UNITS BEFORE MEALS, MAY TAKE 12 IF EATING LESS starches  levemir 30 units at [facility]; if sugar on wake up if sugar in ams under 90   vICTOZA 0.6 MG for 3 days then 1.2

## 2013-11-24 NOTE — Progress Notes (Signed)
Patient ID: [name] [name], male   DOB: 16-Sep-1934, 79 y.o.   MRN: [id number]   Reason for Appointment: Diabetes follow-up   History of Present Illness   Diagnosis: Type 2 DIABETES MELITUS, date of diagnosis: 2002   He has been on insulin since about 2008 with variable control. In 04/2012 his A1c was 10.1% His diabetes control was significantly better with adding Victoza in early 2014 and adjusting his dose of Levemir Also was able to take lower doses of Levemir than before  Recent history: His blood sugars are still not well controlled with his home average glucose 226 He has some difficulties with the cost of medications including insulin and Victoza and is taking them irregularly especially the Victoza which he has not taken Problems identified:  Not following instructions for insulin dosing. Taking less Levemir than prescribed on his last visit  Inconsistent insulin regimen and periodically running out because of cost. Recent blood sugars are not representative of his control as he had several readings  in the 200-300+ range when he ran out of insulin  Not taking Victoza recently because of cost  Probably has postprandial hyperglycemia although difficult to assess, recently had significantly high reading after breakfast  Blood sugars may be improved later in the day but higher before breakfast and not clear if his Levemir is lasting 24 hours  A1c not available recently to assess his readings  Also did not increase his NovoLog as directed on his last visit.   Not able to understand blood sugar targets as discussed previously  Unable to lose weight even though he is exercising  Oral hypoglycemic drugs: None         Side effects from medications: None Insulin regimen: Levemir 27 units daily, NovoLog 12  units before meals       Proper timing of medications in relation to meals: Yes.         Monitors blood glucose: Once a day.    Glucometer: One Touch           Blood Glucose  readings from meter download as follows  PREMEAL Breakfast Lunch Dinner Bedtime Overall  Glucose range:  158-194   162   112   214-388    Median:      180    Hypoglycemia frequency:  none recently.           Meals: 3 meals per day.eating breakfast at 11 am with either oatmeal/egg or apple/peanut butter; lunch 3 PM dinner 6-7 pm         Physical activity: exercise: Walking and at gym 3/7           Dietician visit: Most recent: 4235           Complications: are: ? Nephropathy     Wt Readings from Last 3 Encounters:  11/24/13 239 lb 12.8 oz (108.773 kg)  10/13/13 239 lb 8 oz (108.636 kg)  08/30/13 241 lb 12.8 oz (109.68 kg)   Lab Results  Component Value Date   HGBA1C 8.5* 11/24/2013   HGBA1C 7.8* 08/25/2013   HGBA1C 7.1* 05/11/2013   Lab Results  Component Value Date   MICROALBUR 9.6* 01/25/2013   LDLCALC 84 05/11/2013   CREATININE 1.8* 11/24/2013       Medication List       This list is accurate as of: 11/24/13 11:59 PM.  Always use your most recent med list.  aspirin 81 MG tablet  Take 81 mg by mouth every other day.     carvedilol 25 MG tablet  Commonly known as:  COREG  TAKE 1& 1/2 TABLETS BY MOUTH TWICE DAILY     diazepam 10 MG tablet  Commonly known as:  VALIUM  TAKE 1/2 TO 1 TABLET AT BEDTIME AS NEEDED FOR ANXIETY OR RINGING IN EARS     glucose blood test strip  Commonly known as:  ONETOUCH VERIO  Use as instructed to check blood sugar 3 times per day dx code 250.42     insulin detemir 100 UNIT/ML injection  Commonly known as:  LEVEMIR  Inject 25-27 Units into the skin every morning. Add 1 unit every day until AM sugar is less than 150     Liraglutide 18 MG/3ML Sopn  Commonly known as:  VICTOZA  Inject 1.2 mg into the skin daily.     lisinopril 10 MG tablet  Commonly known as:  PRINIVIL,ZESTRIL  Take 1 tablet (10 mg total) by mouth daily.     nitroGLYCERIN 0.4 MG SL tablet  Commonly known as:  NITROSTAT  Place 1 tablet (0.4 mg total)  under the tongue every 5 (five) minutes as needed. For chest pain     NOVOLOG FLEXPEN 100 UNIT/ML FlexPen  Generic drug:  insulin aspart  USE AS DIRECTED PER SLIDING SCALE. SUGAR100-150=10UNITS, 150-200=12UNITS, 200-250=14UNITS, 250+=16UNITS. USE 3 TIMES DAILY     ONETOUCH DELICA LANCETS FINE Misc  Use to check blood sugar 3 times per day dx code 250.42     rosuvastatin 40 MG tablet  Commonly known as:  CRESTOR  Take 1 tablet (40 mg total) by mouth daily.     torsemide 20 MG tablet  Commonly known as:  DEMADEX  Take 2 tablets (40 mg total) by mouth daily.        Allergies:  Allergies  Allergen Reactions  . Sulfa Antibiotics Swelling and Rash    Hands swell    Past Medical History  Diagnosis Date  . COPD (chronic obstructive pulmonary disease) 1993  . Hyperlipidemia 1993  . Hypertension 1993  . CAD (coronary artery disease)     bypass graft surgery 05/2006; grafts were widely patent on cath 06/2010   . Ischemic cardiomyopathy     EF 25-30%; s/p single chamber ICD 2010; upgrade to Medtronic BiV ICD 01/30/12  . Systolic heart failure     class II  . Foot fracture 1953; 1962    left; left  . Chronic kidney disease   . Myocardial infarction 2008; 01/06/2012  . Syncope and collapse 01/05/2012  . Shortness of breath 01/05/2012    "all the time; been going on for awhile"  . Diabetes mellitus, type 2 2000  . Arthritis     "hands" (01/06/2012)  . Elbow mass     left; "just noticed it ~ 2 wk ago"  (01/06/2012)  . Anxiety   . Nightmares     with psych eval 2012 at [facility]  . PTSD (post-traumatic stress disorder)   . Tinnitus of left ear   . 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 Boil     "fluid boil left arm" (01/30/2012)  . LBBB (left bundle branch block)     Past Surgical History  Procedure Laterality Date  . Coronary artery bypass graft  2008    3 vessel, class 4 CHF, non Q wave MI 05/19/06  . Appendectomy  1957  . Cardiac defibrillator placement  01/30/2012  biventricular ICD  .  Fracture surgery  1953; ~ 1962    left; left  . Cardiac defibrillator placement  01/30/2012    biventricular ICD  . Insert / replace / remove pacemaker  01/30/2012    biventricular ICD placed    Family History  Problem Relation Age of Onset  . Heart disease Father     MI age 80  . Prostate cancer Father   . Diabetes Sister   . Hyperlipidemia Sister   . Cancer Maternal Grandmother     liver, cirrhosis  . Depression Neg Hx   . Alcohol abuse Neg Hx   . Drug abuse Neg Hx   . Stroke Neg Hx   . Colon cancer Neg Hx     Social History:  reports that he has quit smoking. His smoking use included Cigarettes. He has a 17.5 pack-year smoking history. He has never used smokeless tobacco. He reports that he drinks about 4.2 ounces of alcohol per week. He reports that he does not use illicit drugs.  Review of Systems:  Has history of coronary artery disease  Hypertension treated with Coreg and lisinopril low dose with good control  HYPERLIPIDEMIA:  Currently taking Crestor 40 mg but his LDL is still over 70  Lab Results  Component Value Date   CHOL 144 05/11/2013   HDL 38.40* 05/11/2013   LDLCALC 84 05/11/2013   LDLDIRECT 163.3 01/25/2013   TRIG 109.0 05/11/2013   CHOLHDL 4 05/11/2013   CKD: His creatinine is relatively stable, does followup with nephrologist periodically  Lab Results  Component Value Date   CREATININE 1.8* 11/24/2013        Examination:   BP 132/70  Pulse 73  Temp(Src) 98.6 F (37 C)  Resp 16  Ht 5\' 8"  (1.727 m)  Wt 239 lb 12.8 oz (108.773 kg)  BMI 36.47 kg/m2  SpO2 96%  Body mass index is 36.47 kg/(m^2).   No ankle edema present  ASSESSMENT/ PLAN:   Diabetes type 2   His blood sugars are again poorly controlled and this is partly related to difficulty with compliance with his medications and insulin from financial reasons Also has  increase in postprandial blood sugars; however checking blood sugars mostly in the morning and difficult to get a complete  picture especially since he ran out of insulin for a few days about 10 days ago  Problems identified and discussed today are detailed in the history of present illness Discussed with him that he needs to follow instructions for his insulin doses and increased the doses to help keep the blood sugars better controlled both fasting and postprandial May consider using NPH and regular insulin if he again has difficulties with purchasing his insulin  PLAN:   Restart Victoza Insulin doses are outlined below Given instructions for checking his blood sugars Moderating the amount of carbohydrates at meals To call if blood sugars are not improved  Hypertension: Well controlled  Patient Instructions  Please check blood sugars at least half the time about 2 hours after any meal and 3 times per week on waking up. Please bring blood sugar monitor to each visit  NOVOLOG 14 UNITS BEFORE MEALS, MAY TAKE 12 IF EATING LESS starches  levemir 30 units at [facility]; if sugar on wake up if sugar in ams under 90   vICTOZA 0.6 MG for 3 days then 1.2   Counseling time over 50% of today's 25 minute visit  Jefte Carithers 11/25/2013, 12:20 PM   Addendum: A1c  high at 8.5, creatinine stable  Office Visit on 11/24/2013  Component Date Value Ref Range Status  . Hemoglobin A1C 11/24/2013 8.5* 4.6 - 6.5 % Final   Glycemic Control Guidelines for People with Diabetes:Non Diabetic:  <6%Goal of Therapy: <7%Additional Action Suggested:  >8%   . Sodium 11/24/2013 137  135 - 145 mEq/L Final  . Potassium 11/24/2013 3.7  3.5 - 5.1 mEq/L Final  . Chloride 11/24/2013 99  96 - 112 mEq/L Final  . CO2 11/24/2013 30  19 - 32 mEq/L Final  . Glucose, Bld 11/24/2013 64* 70 - 99 mg/dL Final  . BUN 11/24/2013 25* 6 - 23 mg/dL Final  . Creatinine, Ser 11/24/2013 1.8* 0.4 - 1.5 mg/dL Final  . Total Bilirubin 11/24/2013 0.6  0.2 - 1.2 mg/dL Final  . Alkaline Phosphatase 11/24/2013 61  39 - 117 U/L Final  . AST 11/24/2013 21  0 - 37 U/L  Final  . ALT 11/24/2013 19  0 - 53 U/L Final  . Total Protein 11/24/2013 7.4  6.0 - 8.3 g/dL Final  . Albumin 11/24/2013 3.8  3.5 - 5.2 g/dL Final  . Calcium 11/24/2013 9.4  8.4 - 10.5 mg/dL Final  . GFR 11/24/2013 40.11* >60.00 mL/min Final

## 2013-11-25 DIAGNOSIS — N183 Chronic kidney disease, stage 3 unspecified: Secondary | ICD-10-CM | POA: Insufficient documentation

## 2013-11-25 DIAGNOSIS — E1122 Type 2 diabetes mellitus with diabetic chronic kidney disease: Secondary | ICD-10-CM | POA: Insufficient documentation

## 2013-11-25 LAB — COMPREHENSIVE METABOLIC PANEL
ALBUMIN: 3.8 g/dL (ref 3.5–5.2)
ALT: 19 U/L (ref 0–53)
AST: 21 U/L (ref 0–37)
Alkaline Phosphatase: 61 U/L (ref 39–117)
BUN: 25 mg/dL — AB (ref 6–23)
CALCIUM: 9.4 mg/dL (ref 8.4–10.5)
CHLORIDE: 99 meq/L (ref 96–112)
CO2: 30 meq/L (ref 19–32)
Creatinine, Ser: 1.8 mg/dL — ABNORMAL HIGH (ref 0.4–1.5)
GFR: 40.11 mL/min — ABNORMAL LOW (ref >60.00)
Glucose, Bld: 64 mg/dL — ABNORMAL LOW (ref 70–99)
Potassium: 3.7 mEq/L (ref 3.5–5.1)
Sodium: 137 mEq/L (ref 135–145)
Total Bilirubin: 0.6 mg/dL (ref 0.2–1.2)
Total Protein: 7.4 g/dL (ref 6.0–8.3)

## 2013-11-25 LAB — HEMOGLOBIN A1C: HEMOGLOBIN A1C: 8.5 % — AB (ref 4.6–6.5)

## 2013-11-30 ENCOUNTER — Telehealth (HOSPITAL_COMMUNITY): Payer: Self-pay | Admitting: Vascular Surgery

## 2013-11-30 NOTE — Telephone Encounter (Signed)
Please call pt wife about device for pace maker.Marland KitchenMarland Kitchen

## 2013-12-01 NOTE — Telephone Encounter (Signed)
Address confirmed. Carelink monitor ordered.

## 2013-12-01 NOTE — Telephone Encounter (Signed)
LMOVM w/ my direct # to confirm address prior to shipping monitor.

## 2013-12-01 NOTE — Telephone Encounter (Signed)
Pt is requesting a box at home to check optivol, will send to [facility] to arrange home monitor

## 2013-12-08 ENCOUNTER — Other Ambulatory Visit: Payer: Self-pay | Admitting: *Deleted

## 2013-12-08 MED ORDER — CARVEDILOL 25 MG PO TABS: ORAL_TABLET | ORAL | Status: DC

## 2013-12-13 ENCOUNTER — Ambulatory Visit (INDEPENDENT_AMBULATORY_CARE_PROVIDER_SITE_OTHER): Payer: Medicare HMO | Admitting: Family Medicine

## 2013-12-13 ENCOUNTER — Encounter: Payer: Self-pay | Admitting: Family Medicine

## 2013-12-13 VITALS — BP 134/72 | HR 74 | Temp 98.3°F | Wt 247.5 lb

## 2013-12-13 DIAGNOSIS — I10 Essential (primary) hypertension: Secondary | ICD-10-CM

## 2013-12-13 NOTE — Assessment & Plan Note (Signed)
Reasonable control today, d/w pt about weight, need to monitor and add on extra torsemide if weight is up.  He understood.  No change in meds at this point.  Prev labs d/w pt.  He agrees.  Recheck in 6 months.  Defer renal and endo issues to respective clinics.

## 2013-12-13 NOTE — Progress Notes (Signed)
Pre visit review using our clinic review tool, if applicable. No additional management support is needed unless otherwise documented below in the visit note.  He has seen endo re: DM2 and is working on his sugar.  Victoza was expensive but he was able to get it.  Sugar has been improved recently, has f/u with endo pending.    He had f/u with renal last week. His labs are pending from there.    Hypertension:    Using medication without problems or lightheadedness: yes Chest pain with exertion:no Edema:no Short of breath:no Average home BPs: not checked.   BP at goal today.  Recent Cr 1.8 by EMR records. Reviewed, d/w pt.  Still going to the gym, several times a week, encouraged.    Sleeping well, no recent nightmares.  Rare use of valium.    Meds, vitals, and allergies reviewed.   ROS: See HPI.  Otherwise, noncontributory.  nad ncat Mmm Neck supple, no LA rrr Ctab, no rales, no inc in wob abd soft, not ttp Ext w/o edema Inc in weight noted

## 2013-12-13 NOTE — Patient Instructions (Signed)
Recheck in 6 months at a physical.   Continue torsemide 40 mg daily and if your weight goes up more than 3lbs in a day or 5 lbs in a week to take an extra 20 mg of torsemide. Take care. Glad to see you.

## 2013-12-22 ENCOUNTER — Other Ambulatory Visit: Payer: Self-pay | Admitting: Cardiology

## 2013-12-29 ENCOUNTER — Ambulatory Visit (INDEPENDENT_AMBULATORY_CARE_PROVIDER_SITE_OTHER): Payer: Medicare HMO | Admitting: *Deleted

## 2013-12-29 DIAGNOSIS — I5023 Acute on chronic systolic (congestive) heart failure: Secondary | ICD-10-CM

## 2013-12-29 DIAGNOSIS — I428 Other cardiomyopathies: Secondary | ICD-10-CM

## 2013-12-29 DIAGNOSIS — I509 Heart failure, unspecified: Secondary | ICD-10-CM

## 2013-12-29 LAB — MDC_IDC_ENUM_SESS_TYPE_INCLINIC
Battery Voltage: 3.09 V
Brady Statistic AP VS Percent: 0.01 %
Brady Statistic AS VS Percent: 0.25 %
Date Time Interrogation Session: 20150924141245
HIGH POWER IMPEDANCE MEASURED VALUE: 228 Ohm
HIGH POWER IMPEDANCE MEASURED VALUE: 380 Ohm
HIGH POWER IMPEDANCE MEASURED VALUE: 53 Ohm
HighPow Impedance: 42 Ohm
Lead Channel Impedance Value: 399 Ohm
Lead Channel Impedance Value: 703 Ohm
Lead Channel Pacing Threshold Amplitude: 0.5 V
Lead Channel Pacing Threshold Amplitude: 0.75 V
Lead Channel Pacing Threshold Pulse Width: 0.4 ms
Lead Channel Pacing Threshold Pulse Width: 0.4 ms
Lead Channel Sensing Intrinsic Amplitude: 21.5 mV
Lead Channel Sensing Intrinsic Amplitude: 23.5 mV
Lead Channel Setting Pacing Amplitude: 2.5 V
Lead Channel Setting Pacing Pulse Width: 0.4 ms
MDC IDC MSMT LEADCHNL LV IMPEDANCE VALUE: 399 Ohm
MDC IDC MSMT LEADCHNL RA IMPEDANCE VALUE: 399 Ohm
MDC IDC MSMT LEADCHNL RA SENSING INTR AMPL: 2 mV
MDC IDC MSMT LEADCHNL RA SENSING INTR AMPL: 2.875 mV
MDC IDC MSMT LEADCHNL RV IMPEDANCE VALUE: 551 Ohm
MDC IDC MSMT LEADCHNL RV PACING THRESHOLD AMPLITUDE: 0.75 V
MDC IDC MSMT LEADCHNL RV PACING THRESHOLD PULSEWIDTH: 0.4 ms
MDC IDC SET LEADCHNL LV PACING AMPLITUDE: 2.25 V
MDC IDC SET LEADCHNL LV PACING PULSEWIDTH: 0.4 ms
MDC IDC SET LEADCHNL RA PACING AMPLITUDE: 2 V
MDC IDC SET LEADCHNL RV SENSING SENSITIVITY: 0.3 mV
MDC IDC SET ZONE DETECTION INTERVAL: 290 ms
MDC IDC SET ZONE DETECTION INTERVAL: 340 ms
MDC IDC STAT BRADY AP VP PERCENT: 0.35 %
MDC IDC STAT BRADY AS VP PERCENT: 99.39 %
MDC IDC STAT BRADY RA PERCENT PACED: 0.36 %
MDC IDC STAT BRADY RV PERCENT PACED: 99.74 %
Zone Setting Detection Interval: 350 ms
Zone Setting Detection Interval: 400 ms

## 2013-12-29 NOTE — Progress Notes (Signed)
Bi V ICD check in office with ICM.

## 2014-01-12 ENCOUNTER — Encounter: Payer: Self-pay | Admitting: Internal Medicine

## 2014-01-24 ENCOUNTER — Other Ambulatory Visit: Payer: Self-pay | Admitting: Cardiology

## 2014-02-07 ENCOUNTER — Other Ambulatory Visit: Payer: Self-pay | Admitting: Cardiology

## 2014-02-07 DIAGNOSIS — I5022 Chronic systolic (congestive) heart failure: Secondary | ICD-10-CM

## 2014-02-21 ENCOUNTER — Other Ambulatory Visit: Payer: Self-pay | Admitting: Cardiology

## 2014-02-21 ENCOUNTER — Other Ambulatory Visit: Payer: Self-pay | Admitting: Family Medicine

## 2014-02-21 NOTE — Telephone Encounter (Signed)
Received refill request electronically from pharmacy. Request does not match medication sheet. Please advise.

## 2014-02-22 NOTE — Telephone Encounter (Signed)
Updated sig, sent.  Thanks.

## 2014-02-23 ENCOUNTER — Other Ambulatory Visit: Payer: Self-pay | Admitting: Cardiology

## 2014-03-09 ENCOUNTER — Other Ambulatory Visit: Payer: Self-pay | Admitting: Family Medicine

## 2014-03-09 NOTE — Telephone Encounter (Signed)
Last office visit 12/13/2013.  Last refilled 11/15/2013 for #30 with 1 refill.  Ok to refill?

## 2014-03-10 NOTE — Telephone Encounter (Signed)
Medication phoned to pharmacy.  

## 2014-03-10 NOTE — Telephone Encounter (Signed)
Please call in.  Thanks.   

## 2014-03-15 ENCOUNTER — Telehealth: Payer: Self-pay | Admitting: Internal Medicine

## 2014-03-15 NOTE — Telephone Encounter (Signed)
New Message  Per Pt's wife, pt was feeling sluggish and wanted to change a remote device check for a physical visit with Dr. Caryl Comes.  Current appt set- 12/28 @ 8:45.  No return phone call needed; note only for documentation.

## 2014-03-16 ENCOUNTER — Encounter (HOSPITAL_COMMUNITY): Payer: Self-pay | Admitting: Internal Medicine

## 2014-04-03 ENCOUNTER — Encounter: Payer: Self-pay | Admitting: Internal Medicine

## 2014-04-03 ENCOUNTER — Ambulatory Visit (INDEPENDENT_AMBULATORY_CARE_PROVIDER_SITE_OTHER): Payer: Medicare HMO | Admitting: Internal Medicine

## 2014-04-03 VITALS — BP 150/74 | HR 65 | Ht 68.0 in | Wt 247.2 lb

## 2014-04-03 DIAGNOSIS — I5023 Acute on chronic systolic (congestive) heart failure: Secondary | ICD-10-CM

## 2014-04-03 DIAGNOSIS — Z95810 Presence of automatic (implantable) cardiac defibrillator: Secondary | ICD-10-CM

## 2014-04-03 DIAGNOSIS — I2510 Atherosclerotic heart disease of native coronary artery without angina pectoris: Secondary | ICD-10-CM

## 2014-04-03 DIAGNOSIS — I5022 Chronic systolic (congestive) heart failure: Secondary | ICD-10-CM

## 2014-04-03 LAB — MDC_IDC_ENUM_SESS_TYPE_INCLINIC
Brady Statistic AP VP Percent: 0.24 %
Brady Statistic AP VS Percent: 0.01 %
Brady Statistic AS VP Percent: 99.61 %
Brady Statistic RV Percent Paced: 99.85 %
Date Time Interrogation Session: 20151228101347
HIGH POWER IMPEDANCE MEASURED VALUE: 228 Ohm
HIGH POWER IMPEDANCE MEASURED VALUE: 399 Ohm
HighPow Impedance: 48 Ohm
HighPow Impedance: 58 Ohm
Lead Channel Impedance Value: 437 Ohm
Lead Channel Impedance Value: 437 Ohm
Lead Channel Impedance Value: 513 Ohm
Lead Channel Pacing Threshold Amplitude: 0.75 V
Lead Channel Pacing Threshold Amplitude: 0.875 V
Lead Channel Sensing Intrinsic Amplitude: 2.625 mV
Lead Channel Sensing Intrinsic Amplitude: 2.625 mV
Lead Channel Sensing Intrinsic Amplitude: 26.5 mV
Lead Channel Sensing Intrinsic Amplitude: 30.375 mV
Lead Channel Setting Pacing Amplitude: 1.75 V
Lead Channel Setting Pacing Amplitude: 2 V
Lead Channel Setting Pacing Amplitude: 2.5 V
Lead Channel Setting Pacing Pulse Width: 0.4 ms
MDC IDC MSMT BATTERY VOLTAGE: 3.07 V
MDC IDC MSMT LEADCHNL LV IMPEDANCE VALUE: 722 Ohm
MDC IDC MSMT LEADCHNL LV PACING THRESHOLD AMPLITUDE: 0.625 V
MDC IDC MSMT LEADCHNL LV PACING THRESHOLD PULSEWIDTH: 0.4 ms
MDC IDC MSMT LEADCHNL RA IMPEDANCE VALUE: 437 Ohm
MDC IDC MSMT LEADCHNL RA PACING THRESHOLD PULSEWIDTH: 0.4 ms
MDC IDC MSMT LEADCHNL RV PACING THRESHOLD PULSEWIDTH: 0.4 ms
MDC IDC SET LEADCHNL RV PACING PULSEWIDTH: 0.4 ms
MDC IDC SET LEADCHNL RV SENSING SENSITIVITY: 0.3 mV
MDC IDC SET ZONE DETECTION INTERVAL: 290 ms
MDC IDC SET ZONE DETECTION INTERVAL: 350 ms
MDC IDC STAT BRADY AS VS PERCENT: 0.14 %
MDC IDC STAT BRADY RA PERCENT PACED: 0.25 %
Zone Setting Detection Interval: 340 ms
Zone Setting Detection Interval: 400 ms

## 2014-04-03 NOTE — Patient Instructions (Signed)
Your physician wants you to follow-up in: 12 months with Dr. Gari Crown will receive a reminder letter in the mail two months in advance. If you don't receive a letter, please call our office to schedule the follow-up appointment.  Remote monitoring is used to monitor your Pacemaker or ICD from home. This monitoring reduces the number of office visits required to check your device to one time per year. It allows [place] to keep an eye on the functioning of your device to ensure it is working properly. You are scheduled for a device check from home on 07/03/14. You may send your transmission at any time that day. If you have a wireless device, the transmission will be sent automatically. After your physician reviews your transmission, you will receive a postcard with your next transmission date.

## 2014-04-03 NOTE — Progress Notes (Signed)
Patient Care Team: Tonia Ghent, MD as PCP - General (Family Medicine) Larey Dresser, MD as Referring Physician (Cardiology)   HPI  [name] [name] is a 79 y.o. male Seen in followup for CRT-D.  Initial implant 2009 with CRT upgrade 2013 (GT)  implanted for ischemic heart myopathy and nonsustained ventricular tachycardia he has a history of left bundle branch block   . Catheterization 3/12 demonstrated patent grafts. Ejection fraction 20-25%.there is interval improvement in his ejection fraction to 35-40% 10/14.  Is obstructive sleep apnea treated with CPAP.  The patient denies chest pain,  , nocturnal dyspnea, orthopnea or peripheral edema.   He does have some wheezing   There have been no palpitations, lightheadedness or syncope.  Blood pressure is elevated at times but normalizes with time    Past Medical History  Diagnosis Date  . COPD (chronic obstructive pulmonary disease) 1993  . Hyperlipidemia 1993  . Hypertension 1993  . CAD (coronary artery disease)     bypass graft surgery 05/2006; grafts were widely patent on cath 06/2010   . Ischemic cardiomyopathy     EF 25-30%; s/p single chamber ICD 2010; upgrade to Medtronic BiV ICD 01/30/12  . Systolic heart failure     class II  . Foot fracture 1953; 1962    left; left  . Chronic kidney disease   . Myocardial infarction 2008; 01/06/2012  . Syncope and collapse 01/05/2012  . Shortness of breath 01/05/2012    "all the time; been going on for awhile"  . Diabetes mellitus, type 2 2000  . Arthritis     "hands" (01/06/2012)  . Elbow mass     left; "just noticed it ~ 2 wk ago"  (01/06/2012)  . Anxiety   . Nightmares     with psych eval 2012 at [facility]  . PTSD (post-traumatic stress disorder)   . Tinnitus of left ear   . 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 Boil     "fluid boil left arm" (01/30/2012)  . LBBB (left bundle branch block)     Past Surgical History  Procedure Laterality Date  . Coronary artery bypass graft  2008      3 vessel, class 4 CHF, non Q wave MI 05/19/06  . Appendectomy  1957  . Cardiac defibrillator placement  01/30/2012    biventricular ICD  . Fracture surgery  1953; ~ 1962    left; left  . Cardiac defibrillator placement  01/30/2012    biventricular ICD  . Insert / replace / remove pacemaker  01/30/2012    biventricular ICD placed  . Bi-ventricular implantable cardioverter defibrillator upgrade N/A 01/30/2012    Procedure: BI-VENTRICULAR IMPLANTABLE CARDIOVERTER DEFIBRILLATOR UPGRADE;  Surgeon: Evans Lance, MD;  Location: [facility] CATH LAB;  Service: Cardiovascular;  Laterality: N/A;    Current Outpatient Prescriptions  Medication Sig Dispense Refill  . aspirin 81 MG tablet Take 81 mg by mouth every other day.     . carvedilol (COREG) 25 MG tablet TAKE 1& 1/2 TABLETS BY MOUTH TWICE DAILY 90 tablet 5  . diazepam (VALIUM) 10 MG tablet TAKE 1/2 TO 1 TABLET BY MOUTH AT BEDTIMEAS NEEDED FOR ANXIETY OR RINGING IN EARS 30 tablet 1  . glucose blood (ONETOUCH VERIO) test strip Use as instructed to check blood sugar 3 times per day dx code 250.42 100 each 3  . Insulin Detemir (LEVEMIR FLEXTOUCH) 100 UNIT/ML Pen Inject 28-30 Units into the skin at bedtime. Add 1 unit  every day until AM sugar is less than 150 15 mL 12  . insulin detemir (LEVEMIR) 100 UNIT/ML injection Inject 28-30 Units into the skin at bedtime. Add 1 unit every day until AM sugar is less than 150    . Liraglutide (VICTOZA) 18 MG/3ML SOPN Inject 1.2 mg into the skin daily. 2 pen 5  . lisinopril (PRINIVIL,ZESTRIL) 10 MG tablet TAKE 1 TABLET BY MOUTH ONCE A DAY 30 tablet 6  . nitroGLYCERIN (NITROSTAT) 0.4 MG SL tablet Place 1 tablet (0.4 mg total) under the tongue every 5 (five) minutes as needed. For chest pain 25 tablet 11  . NOVOLOG FLEXPEN 100 UNIT/ML FlexPen USE AS DIRECTED PER SLIDING SCALE. SUGAR100-150=10UNITS, 150-200=12UNITS, 200-250=14UNITS, 250+=16UNITS. USE 3 TIMES DAILY 30 mL 3  . ONETOUCH DELICA LANCETS FINE MISC Use to  check blood sugar 3 times per day dx code 250.42 100 each 3  . rosuvastatin (CRESTOR) 40 MG tablet Take 1 tablet (40 mg total) by mouth daily. 30 tablet 3  . torsemide (DEMADEX) 20 MG tablet TAKE 2 TABLETS BY MOUTH ONCE A DAY 60 tablet 0   No current facility-administered medications for this visit.    Allergies  Allergen Reactions  . Sulfa Antibiotics Swelling and Rash    Hands swell    Review of Systems negative except from HPI and PMH  Physical Exam BP 150/74 mmHg  Pulse 65  Ht 5\' 8"  (1.727 m)  Wt 247 lb 3.2 oz (112.129 kg)  BMI 37.60 kg/m2 Well developed and well nourished in no acute distress HENT normal E scleral and icterus clear Neck Supple JVP flat; carotids brisk and full Clear to ausculation Device pocket well healed; without hematoma or erythema.  There is no tethering \*Regular rate and rhythm, no murmurs gallops or rub Soft with active bowel sounds No clubbing cyanosis  Edema Alert and oriented, grossly normal motor and sensory function Skin Warm and Dry  ECG  P-synchronous/ BiV  pacing   Assessment and  Plan  Ischemic cardiac myopathy  Without symptoms of ischemia  Congestive heart failure-chronic systolic euvolemix  CRT-D-Medtronic  The patient's device was interrogated.  The information was reviewed. No changes were made in the programming.   Hypertension   He says better at home then here   Rechecked here also and was 125 sys    if dypsnea persists would have him follwoup with PCP

## 2014-04-05 ENCOUNTER — Other Ambulatory Visit (HOSPITAL_COMMUNITY): Payer: Self-pay | Admitting: Cardiology

## 2014-04-05 ENCOUNTER — Encounter (HOSPITAL_COMMUNITY): Payer: Self-pay | Admitting: Cardiology

## 2014-04-05 DIAGNOSIS — I5022 Chronic systolic (congestive) heart failure: Secondary | ICD-10-CM

## 2014-04-05 MED ORDER — TORSEMIDE 20 MG PO TABS: 40.0000 mg | ORAL_TABLET | Freq: Every day | ORAL | Status: DC

## 2014-04-05 NOTE — Telephone Encounter (Signed)
Refill request received return with message for pt to schedule follow up Letter mailed

## 2014-05-25 ENCOUNTER — Other Ambulatory Visit: Payer: Self-pay

## 2014-05-25 DIAGNOSIS — I5022 Chronic systolic (congestive) heart failure: Secondary | ICD-10-CM

## 2014-05-25 MED ORDER — TORSEMIDE 20 MG PO TABS: 40.0000 mg | ORAL_TABLET | Freq: Every day | ORAL | Status: DC

## 2014-06-01 ENCOUNTER — Other Ambulatory Visit: Payer: Self-pay | Admitting: Family Medicine

## 2014-06-01 NOTE — Telephone Encounter (Signed)
Electronic refill request. Last Filled:    30 tablet 1 Rf on 03/10/2014  Please advise.

## 2014-06-01 NOTE — Telephone Encounter (Signed)
Please call in.  Thanks.   

## 2014-06-01 NOTE — Telephone Encounter (Signed)
Medication phoned to pharmacy.  

## 2014-06-12 ENCOUNTER — Other Ambulatory Visit: Payer: Self-pay | Admitting: Family Medicine

## 2014-06-13 ENCOUNTER — Other Ambulatory Visit: Payer: Self-pay | Admitting: Family Medicine

## 2014-06-13 ENCOUNTER — Telehealth: Payer: Self-pay | Admitting: Endocrinology

## 2014-06-13 ENCOUNTER — Other Ambulatory Visit: Payer: Self-pay | Admitting: *Deleted

## 2014-06-13 MED ORDER — GLUCOSE BLOOD VI STRP: ORAL_STRIP | Status: DC

## 2014-06-13 NOTE — Telephone Encounter (Signed)
Patient called to schedule follow up appointment  Please call in Rx  Rx: One Touch Verio test strips   Pharm[facility]: [facility]   Thank you

## 2014-06-15 ENCOUNTER — Other Ambulatory Visit: Payer: Medicare HMO

## 2014-06-16 ENCOUNTER — Other Ambulatory Visit (INDEPENDENT_AMBULATORY_CARE_PROVIDER_SITE_OTHER): Payer: PPO

## 2014-06-16 DIAGNOSIS — IMO0002 Reserved for concepts with insufficient information to code with codable children: Secondary | ICD-10-CM

## 2014-06-16 DIAGNOSIS — I10 Essential (primary) hypertension: Secondary | ICD-10-CM

## 2014-06-16 DIAGNOSIS — E1129 Type 2 diabetes mellitus with other diabetic kidney complication: Secondary | ICD-10-CM

## 2014-06-16 DIAGNOSIS — E1165 Type 2 diabetes mellitus with hyperglycemia: Secondary | ICD-10-CM

## 2014-06-16 LAB — URINALYSIS, ROUTINE W REFLEX MICROSCOPIC
Bilirubin Urine: NEGATIVE
Hgb urine dipstick: NEGATIVE
Ketones, ur: NEGATIVE
Leukocytes, UA: NEGATIVE
Nitrite: NEGATIVE
SPECIFIC GRAVITY, URINE: 1.025 (ref 1.000–1.030)
TOTAL PROTEIN, URINE-UPE24: 100 — AB
URINE GLUCOSE: 250 — AB
UROBILINOGEN UA: 0.2 (ref 0.0–1.0)
pH: 5.5 (ref 5.0–8.0)

## 2014-06-16 LAB — LDL CHOLESTEROL, DIRECT: LDL DIRECT: 201 mg/dL

## 2014-06-16 LAB — GLUCOSE, RANDOM: GLUCOSE: 94 mg/dL (ref 70–99)

## 2014-06-19 ENCOUNTER — Ambulatory Visit (INDEPENDENT_AMBULATORY_CARE_PROVIDER_SITE_OTHER): Payer: PPO | Admitting: Endocrinology

## 2014-06-19 ENCOUNTER — Encounter: Payer: Self-pay | Admitting: Endocrinology

## 2014-06-19 VITALS — BP 142/74 | HR 93 | Temp 97.9°F | Resp 16 | Ht 68.0 in | Wt 247.4 lb

## 2014-06-19 DIAGNOSIS — IMO0002 Reserved for concepts with insufficient information to code with codable children: Secondary | ICD-10-CM

## 2014-06-19 DIAGNOSIS — E1165 Type 2 diabetes mellitus with hyperglycemia: Secondary | ICD-10-CM | POA: Diagnosis not present

## 2014-06-19 DIAGNOSIS — E785 Hyperlipidemia, unspecified: Secondary | ICD-10-CM | POA: Diagnosis not present

## 2014-06-19 LAB — FRUCTOSAMINE: Fructosamine: 300 umol/L — ABNORMAL HIGH (ref 190–270)

## 2014-06-19 NOTE — Patient Instructions (Signed)
Please check blood sugars at least half the time about 2 hours after any meal and 3 times per week on waking up. Please bring blood sugar monitor to each visit. Recommended blood sugar levels about 2 hours after meal is 140-180 and on waking up 90-130  Take Victoza 1.2 mg DAILY.  Call if not able to afford it  LEVEMIR: May try taking this at dinnertime, continue 30 units.  NOVOLOG: Take 10 units at breakfast and 12 units at lunch and dinner, take this BEFORE eating  Regular walking  Take Crestor half tablet daily until finished and then start taking ATORVASTATIN half tablet daily which is a generic for Lipitor for cholesterol

## 2014-06-19 NOTE — Progress Notes (Signed)
Patient ID: [name] [name], male   DOB: [birth year], 80 y.o.   MRN: [id number]   Reason for Appointment: Diabetes follow-up   History of Present Illness   Diagnosis: Type 2 DIABETES MELITUS, date of diagnosis: 2002   He has been on insulin since about 2008 with variable control. In 04/2012 his A1c was 10.1% His diabetes control was significantly better with adding Victoza in early 2014 and adjusting his dose of Levemir Also was able to take lower doses of Levemir than before  Recent history:  Insulin regimen: Levemir 30 units daily, NovoLog 10  units before breakfast only   He is again very noncompliant with his follow-up and has not been seen for about 6 months He continues to have poor control of his diabetes He has been given detailed instructions and explained his insulin regimen on each visit but he continues to be noncompliant with his mealtime insulin and does not need to return understand the importance of taking mealtime insulin with every meal Also was told to take 14 units of NovoLog before meals and he is taking only 10 units before breakfast A1c not done as he was supposed to have a 6 week follow-up after his last visit He has however restarted his [place] which he could not afford last year because of the donut hole; even then he does not take this every day but takes it everyday to conservative the medication Problems identified:  Not following instructions for insulin dosing. Taking NovoLog usually only before breakfast and only occasionally before lunch  Not understanding the fact that his blood sugars are markedly increased after his evening meal because of not taking his NOVOLOG with supper.  Blood sugars have been as high as 481 after supper  He is checking his blood sugars mostly before lunch which are fairly good especially with his taking 10 units of NovoLog consistently before breakfast  Fasting blood sugar checked only occasionally with recent reading of 129 and  lab glucose was 94  Unable to lose weight even though he is usually exercising when the weather is good  Oral hypoglycemic drugs: None         Side effects from medications: None Proper timing of medications in relation to meals: Yes.         Monitors blood glucose: Once a day.    Glucometer: One Touch           Blood Glucose readings from meter download as follows  PRE-MEAL Breakfast Lunch Dinner Bedtime Overall  Glucose range:  129   100-153   196, 122   165-481    Mean:     294   189    Hypoglycemia frequency:  none recently.           Meals: 3 meals per day.eating breakfast at 9 am with either oatmeal/egg or apple/peanut butter; lunch 3 PM dinner 6-7 pm         Physical activity: exercise: Walking and at gym 3/7 days when the weather is good           Dietician visit: Most recent: [id number]           Complications: are: ? Nephropathy     Wt Readings from Last 3 Encounters:  06/19/14 247 lb 6.4 oz (112.22 kg)  04/03/14 247 lb 3.2 oz (112.129 kg)  12/13/13 247 lb 8 oz (112.265 kg)   Lab Results  Component Value Date   HGBA1C 8.5* 11/24/2013   HGBA1C 7.8* 08/25/2013  HGBA1C 7.1* 05/11/2013   Lab Results  Component Value Date   MICROALBUR 9.6* 01/25/2013   LDLCALC 84 05/11/2013   CREATININE 1.8* 11/24/2013       Medication List       This list is accurate as of: 06/19/14  9:22 PM.  Always use your most recent med list.               aspirin 81 MG tablet  Take 81 mg by mouth every other day.     carvedilol 25 MG tablet  Commonly known as:  COREG  TAKE 1 AND 1/2 TABLETS BY MOUTH TWICE A DAY     diazepam 10 MG tablet  Commonly known as:  VALIUM  TAKE 1/2 TO 1 TABLET BY MOUTH AT BEDTIMEAS NEEDED FOR ANXIETY OR RINGING IN EARS     glucose blood test strip  Commonly known as:  ONETOUCH VERIO  Use as instructed to check blood sugar 3 times per day dx code E11.29     insulin detemir 100 UNIT/ML injection  Commonly known as:  LEVEMIR  Inject 28-30 Units into the  skin at bedtime. Add 1 unit every day until AM sugar is less than 150     Liraglutide 18 MG/3ML Sopn  Commonly known as:  VICTOZA  Inject 1.2 mg into the skin daily.     lisinopril 10 MG tablet  Commonly known as:  PRINIVIL,ZESTRIL  TAKE 1 TABLET BY MOUTH ONCE A DAY     nitroGLYCERIN 0.4 MG SL tablet  Commonly known as:  NITROSTAT  Place 1 tablet (0.4 mg total) under the tongue every 5 (five) minutes as needed. For chest pain     NOVOLOG FLEXPEN 100 UNIT/ML FlexPen  Generic drug:  insulin aspart  USE AS DIRECTED PER SLIDING SCALE. SUGAR100-150=10UNITS, 150-200=12UNITS, 200-250=14UNITS, 250+=16UNITS. USE 3 TIMES DAILY     ONETOUCH DELICA LANCETS FINE Misc  Use to check blood sugar 3 times per day dx code 250.42     rosuvastatin 40 MG tablet  Commonly known as:  CRESTOR  Take 1 tablet (40 mg total) by mouth daily.     torsemide 20 MG tablet  Commonly known as:  DEMADEX  Take 2 tablets (40 mg total) by mouth daily.        Allergies:  Allergies  Allergen Reactions  . Sulfa Antibiotics Swelling and Rash    Hands swell    Past Medical History  Diagnosis Date  . COPD (chronic obstructive pulmonary disease) 1993  . Hyperlipidemia 1993  . Hypertension 1993  . CAD (coronary artery disease)     bypass graft surgery 05/2006; grafts were widely patent on cath 06/2010   . Ischemic cardiomyopathy     EF 25-30%; s/p single chamber ICD 2010; upgrade to Medtronic BiV ICD 01/30/12  . Systolic heart failure     class II  . Foot fracture 1953; 1962    left; left  . Chronic kidney disease   . Myocardial infarction 2008; 01/06/2012  . Syncope and collapse 01/05/2012  . Shortness of breath 01/05/2012    "all the time; been going on for awhile"  . Diabetes mellitus, type 2 2000  . Arthritis     "hands" (01/06/2012)  . Elbow mass     left; "just noticed it ~ 2 wk ago"  (01/06/2012)  . Anxiety   . Nightmares     with psych eval 2012 at [facility]  . PTSD (post-traumatic stress disorder)   .  Tinnitus of  left ear   . 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 Boil     "fluid boil left arm" (01/30/2012)  . LBBB (left bundle branch block)     Past Surgical History  Procedure Laterality Date  . Coronary artery bypass graft  2008    3 vessel, class 4 CHF, non Q wave MI 05/19/06  . Appendectomy  1957  . Cardiac defibrillator placement  01/30/2012    biventricular ICD  . Fracture surgery  1953; ~ 1962    left; left  . Cardiac defibrillator placement  01/30/2012    biventricular ICD  . Insert / replace / remove pacemaker  01/30/2012    biventricular ICD placed  . Bi-ventricular implantable cardioverter defibrillator upgrade N/A 01/30/2012    Procedure: BI-VENTRICULAR IMPLANTABLE CARDIOVERTER DEFIBRILLATOR UPGRADE;  Surgeon: Evans Lance, MD;  Location: [facility] CATH LAB;  Service: Cardiovascular;  Laterality: N/A;    Family History  Problem Relation Age of Onset  . Heart disease Father     MI age 48  . Prostate cancer Father   . Diabetes Sister   . Hyperlipidemia Sister   . Cancer Maternal Grandmother     liver, cirrhosis  . Depression Neg Hx   . Alcohol abuse Neg Hx   . Drug abuse Neg Hx   . Stroke Neg Hx   . Colon cancer Neg Hx     Social History:  reports that he has quit smoking. His smoking use included Cigarettes. He has a 17.5 pack-year smoking history. He has never used smokeless tobacco. He reports that he drinks about 4.2 oz of alcohol per week. He reports that he does not use illicit drugs.  Review of Systems:  Has history of coronary artery disease  Hypertension treated with Coreg and lisinopril low dose with good control  HYPERLIPIDEMIA:  Currently not taking his Crestor 40 mg probably because of cost and has not taken this for a while LDL is markedly increased.  He does not understand the importance of treating hyperlipidemia even with his history of coronary artery disease Does not think he has had side effects from Lipitor previously  Lab Results  Component Value  Date   CHOL 144 05/11/2013   HDL 38.40* 05/11/2013   LDLCALC 84 05/11/2013   LDLDIRECT 201.0 06/16/2014   TRIG 109.0 05/11/2013   CHOLHDL 4 05/11/2013   CKD: His creatinine is usually around 1.8-2, does followup with nephrologist periodically but no recent records available  Lab Results  Component Value Date   CREATININE 1.8* 11/24/2013        Examination:   BP 142/74 mmHg  Pulse 93  Temp(Src) 97.9 F (36.6 C)  Resp 16  Ht 5\' 8"  (1.727 m)  Wt 247 lb 6.4 oz (112.22 kg)  BMI 37.63 kg/m2  SpO2 95%  Body mass index is 37.63 kg/(m^2).   No  edema present  ASSESSMENT/ PLAN:   Diabetes type 2   His blood sugars are again poorly controlled and this is mostly with high blood sugars after his lunch and supper Although A1c not available fructosamine is significantly high Blood sugars after supper are averaging nearly 300 because of not taking insulin at suppertime and possibly inadequate duration of action of Levemir in the evening Problems identified and discussed today are detailed in the history of present illness Again discussed with him the need for taking mealtime insulin to prevent a mealtimes spikes that he is having and showed him his download record with high readings after  supper Although he is taking Victoza he is taking this only every other day and does not understand the need to take this daily  His diet is reasonably good but frequently eating cereal at breakfast and discussed need for balanced meals May consider using NPH and regular insulin if he again has difficulties with purchasing his insulin  HYPERLIPIDEMIA: His LDL is over 200 and he needs to take a statin drug, discussed importance of lipid control and potential for worsening of cardiovascular disease  PLAN:   Take Victoza daily Insulin doses discussed again and showed him the printout of his instructions.  He needs to take 12 units before lunch and dinner daily regardless of blood sugar before the  meal May need 14 units if blood sugars after supper are still over 180 Also consider twice a day Levemir if blood sugars at suppertime stay high  He can continue the Crestor he is taking at home with half tablet daily and subsequently take 40 mg Lipitor daily Discussed timing and targets of blood sugars Regular exercise and balanced meals   Hypertension: Well controlled  Patient Instructions  Please check blood sugars at least half the time about 2 hours after any meal and 3 times per week on waking up. Please bring blood sugar monitor to each visit. Recommended blood sugar levels about 2 hours after meal is 140-180 and on waking up 90-130  Take Victoza 1.2 mg DAILY.  Call if not able to afford it  LEVEMIR: May try taking this at dinnertime, continue 30 units.  NOVOLOG: Take 10 units at breakfast and 12 units at lunch and dinner, take this BEFORE eating  Regular walking  Take Crestor half tablet daily until finished and then start taking ATORVASTATIN half tablet daily which is a generic for Lipitor for cholesterol    Counseling time over 50% of today's 25 minute visit  Timmi Devora 06/19/2014, 9:22 PM     Lab on 06/16/2014  Component Date Value Ref Range Status  . Direct LDL 06/16/2014 201.0   Final   Optimal:  <100 mg/dLNear or Above Optimal:  100-129 mg/dLBorderline High:  130-159 mg/dLHigh:  160-189 mg/dLVery High:  >190 mg/dL  . Color, Urine 06/16/2014 YELLOW  Yellow;Lt. Yellow Final  . APPearance 06/16/2014 CLEAR  Clear Final  . Specific Gravity, Urine 06/16/2014 1.025  1.000-1.030 Final  . pH 06/16/2014 5.5  5.0 - 8.0 Final  . Total Protein, Urine 06/16/2014 100* Negative Final  . Urine Glucose 06/16/2014 250* Negative Final  . Ketones, ur 06/16/2014 NEGATIVE  Negative Final  . Bilirubin Urine 06/16/2014 NEGATIVE  Negative Final  . Hgb urine dipstick 06/16/2014 NEGATIVE  Negative Final  . Urobilinogen, UA 06/16/2014 0.2  0.0 - 1.0 Final  . Leukocytes, UA 06/16/2014  NEGATIVE  Negative Final  . Nitrite 06/16/2014 NEGATIVE  Negative Final  . WBC, UA 06/16/2014 0-2/hpf  0-2/hpf Final  . Hyaline Casts, UA 06/16/2014 Presence of* None Final  . Fructosamine 06/16/2014 300* 190 - 270 umol/L Final  . Glucose, Bld 06/16/2014 94  70 - 99 mg/dL Final

## 2014-07-03 ENCOUNTER — Telehealth: Payer: Self-pay | Admitting: Cardiology

## 2014-07-03 ENCOUNTER — Encounter: Payer: PPO | Admitting: *Deleted

## 2014-07-03 NOTE — Telephone Encounter (Signed)
Spoke with pt and reminded pt of remote transmission that is due today. Pt verbalized understanding.   

## 2014-07-04 ENCOUNTER — Telehealth: Payer: Self-pay

## 2014-07-04 ENCOUNTER — Encounter: Payer: Self-pay | Admitting: Cardiology

## 2014-07-04 NOTE — Telephone Encounter (Signed)
[name] [name] pts wife left v/m requesting for increase in quantity and instructions of diazepam 10 mg. Pt takes med one twice a day usually. Instructions are take 1/2 - 1 tab at hs prn for anxiety with quantity # 30. Pt request cb. Pt last seen 12/13/2013. [facility].

## 2014-07-05 NOTE — Telephone Encounter (Signed)
Left message on cell and home number to call back.

## 2014-07-05 NOTE — Telephone Encounter (Signed)
Needs OV to discuss.  He also needs CPE scheduled for spring/summer.  Thanks.

## 2014-07-05 NOTE — Telephone Encounter (Signed)
Patient and wife notified as instructed by telephone and verbalized understanding. Appointments scheduled.

## 2014-07-07 ENCOUNTER — Ambulatory Visit: Payer: PPO | Admitting: Family Medicine

## 2014-07-10 ENCOUNTER — Ambulatory Visit: Payer: PPO | Admitting: Family Medicine

## 2014-07-10 DIAGNOSIS — Z0289 Encounter for other administrative examinations: Secondary | ICD-10-CM

## 2014-07-26 ENCOUNTER — Other Ambulatory Visit: Payer: PPO

## 2014-07-27 ENCOUNTER — Telehealth: Payer: Self-pay | Admitting: Endocrinology

## 2014-07-27 ENCOUNTER — Other Ambulatory Visit: Payer: PPO

## 2014-07-27 NOTE — Telephone Encounter (Signed)
Patient wife stated that patient is falling a lot, wife is having a hard time getting him to his appt, so she will, just let him get his blood work at his visit.

## 2014-07-31 ENCOUNTER — Ambulatory Visit (INDEPENDENT_AMBULATORY_CARE_PROVIDER_SITE_OTHER): Payer: PPO | Admitting: Endocrinology

## 2014-07-31 ENCOUNTER — Telehealth: Payer: Self-pay | Admitting: Family Medicine

## 2014-07-31 ENCOUNTER — Encounter: Payer: Self-pay | Admitting: Endocrinology

## 2014-07-31 VITALS — BP 166/75 | HR 72 | Temp 98.2°F | Resp 16 | Ht 68.0 in | Wt 244.8 lb

## 2014-07-31 DIAGNOSIS — E1165 Type 2 diabetes mellitus with hyperglycemia: Secondary | ICD-10-CM | POA: Diagnosis not present

## 2014-07-31 DIAGNOSIS — IMO0002 Reserved for concepts with insufficient information to code with codable children: Secondary | ICD-10-CM

## 2014-07-31 DIAGNOSIS — E785 Hyperlipidemia, unspecified: Secondary | ICD-10-CM

## 2014-07-31 DIAGNOSIS — R269 Unspecified abnormalities of gait and mobility: Secondary | ICD-10-CM

## 2014-07-31 NOTE — Telephone Encounter (Signed)
Call pt.  I saw endo notes.   If he will come in to talk about balance and lipids, then schedule 71mn visit. I would prefer this option.  If not, then we can talk about it when he comes in later for CPE.  Have him remind me at the CPE if he doesn't schedule separate appointment.   Thanks.

## 2014-07-31 NOTE — Progress Notes (Signed)
Patient ID: [name] [name], male   DOB: February 01, 1935, 80 y.o.   MRN: [id number]   Reason for Appointment: Diabetes follow-up   History of Present Illness   Diagnosis: Type 2 DIABETES MELITUS, date of diagnosis: 2002   He has been on insulin since about 2008 with variable control. In 04/2012 his A1c was 10.1% His diabetes control was significantly better with adding Victoza in early 2014 and adjusting his dose of Levemir Also was able to take lower doses of Levemir than before  Recent history:  Insulin regimen: Levemir 30 units daily at bedtime, NovoLog 12  units before meals    He continues to have poor control of his diabetes He has been given detailed instructions and explained his insulin regimen on each visit but he continues to be variably noncompliant with his mealtime insulin  Problems identified:  Appears not to be getting adequate insulin overall control with glucose average at home 178  Although he has excellent FASTING blood sugars usually he has progressively higher insulin later in the day  He is still trying to adjust his bedtime Levemir based on his reading at night although mostly taking 30 units  He thinks he is trying to be a little more compliant with taking his NOVOLOG insulin for all meals since his last visit but may sometimes miss the dose at lunch  Not consistently taking the insulin at suppertime when he was traveling a few weeks ago and had blood sugars as high as 341 after supper  Most likely is having high high blood sugars BEFORE supper but has only a couple of days readings recently  Again not clear what time he is eating his meals as he is generally having his first meal around noon  POSTPRANDIAL readings after supper are mostly high with only a couple of readings in the 180   He still has relatively high readings at bedtime mostly and lowest reading of 148 at midnight   He thinks he is taking Victoza regularly and this may be helping some  with his control and weight is slightly better  He has not come for his lab work and no A1c available since 11/2013  Oral hypoglycemic drugs: None         Side effects from medications: None Proper timing of medications in relation to meals: Yes.         Monitors blood glucose: Once a day.    Glucometer: One Touch           Blood Glucose readings from meter download as follows  PRE-MEAL Breakfast Lunch Dinner Bedtime Overall  Glucose range:  94-151    208, 228   148-341    Mean/median:  115      162/178    POST-MEAL PC Breakfast PC Lunch PC Dinner  Glucose range:  162-192  ?     Mean/median:       Hypoglycemia frequency:  none recently.           Meals: 2-3 meals per day.eating breakfast at 11 am with either oatmeal/egg or apple/peanut butter; lunch 3 PM dinner 7 pm         Physical activity: exercise: Walking less recently           Dietician visit: Most recent: 3329           Complications: are: ? Nephropathy     Wt Readings from Last 3 Encounters:  07/31/14 244 lb 12.8 oz (111.041 kg)  06/19/14 247  lb 6.4 oz (112.22 kg)  04/03/14 247 lb 3.2 oz (112.129 kg)   Lab Results  Component Value Date   HGBA1C 8.5* 11/24/2013   HGBA1C 7.8* 08/25/2013   HGBA1C 7.1* 05/11/2013   Lab Results  Component Value Date   MICROALBUR 9.6* 01/25/2013   LDLCALC 84 05/11/2013   CREATININE 1.8* 11/24/2013     OTHER problems: Reviewed in ROS especially recent problem of balance difficulty and falls    Medication List       This list is accurate as of: 07/31/14  9:52 PM.  Always use your most recent med list.               aspirin 81 MG tablet  Take 81 mg by mouth every other day.     carvedilol 25 MG tablet  Commonly known as:  COREG  TAKE 1 AND 1/2 TABLETS BY MOUTH TWICE A DAY     diazepam 10 MG tablet  Commonly known as:  VALIUM  TAKE 1/2 TO 1 TABLET BY MOUTH AT BEDTIMEAS NEEDED FOR ANXIETY OR RINGING IN EARS     glucose blood test strip  Commonly known as:  ONETOUCH  VERIO  Use as instructed to check blood sugar 3 times per day dx code E11.29     insulin detemir 100 UNIT/ML injection  Commonly known as:  LEVEMIR  Inject 28-30 Units into the skin at bedtime. Add 1 unit every day until AM sugar is less than 150     Liraglutide 18 MG/3ML Sopn  Commonly known as:  VICTOZA  Inject 1.2 mg into the skin daily.     lisinopril 10 MG tablet  Commonly known as:  PRINIVIL,ZESTRIL  TAKE 1 TABLET BY MOUTH ONCE A DAY     nitroGLYCERIN 0.4 MG SL tablet  Commonly known as:  NITROSTAT  Place 1 tablet (0.4 mg total) under the tongue every 5 (five) minutes as needed. For chest pain     NOVOLOG FLEXPEN 100 UNIT/ML FlexPen  Generic drug:  insulin aspart  USE AS DIRECTED PER SLIDING SCALE. SUGAR100-150=10UNITS, 150-200=12UNITS, 200-250=14UNITS, 250+=16UNITS. USE 3 TIMES DAILY     ONETOUCH DELICA LANCETS FINE Misc  Use to check blood sugar 3 times per day dx code 250.42     rosuvastatin 40 MG tablet  Commonly known as:  CRESTOR  Take 1 tablet (40 mg total) by mouth daily.     torsemide 20 MG tablet  Commonly known as:  DEMADEX  Take 2 tablets (40 mg total) by mouth daily.        Allergies:  Allergies  Allergen Reactions  . Sulfa Antibiotics Swelling and Rash    Hands swell    Past Medical History  Diagnosis Date  . COPD (chronic obstructive pulmonary disease) 1993  . Hyperlipidemia 1993  . Hypertension 1993  . CAD (coronary artery disease)     bypass graft surgery 05/2006; grafts were widely patent on cath 06/2010   . Ischemic cardiomyopathy     EF 25-30%; s/p single chamber ICD 2010; upgrade to Medtronic BiV ICD 01/30/12  . Systolic heart failure     class II  . Foot fracture 1953; 1962    left; left  . Chronic kidney disease   . Myocardial infarction 2008; 01/06/2012  . Syncope and collapse 01/05/2012  . Shortness of breath 01/05/2012    "all the time; been going on for awhile"  . Diabetes mellitus, type 2 2000  . Arthritis     "hands"  (  01/06/2012)  . Elbow mass     left; "just noticed it ~ 2 wk ago"  (01/06/2012)  . Anxiety   . Nightmares     with psych eval 2012 at [facility]  . PTSD (post-traumatic stress disorder)   . Tinnitus of left ear   . 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 Boil     "fluid boil left arm" (01/30/2012)  . LBBB (left bundle branch block)     Past Surgical History  Procedure Laterality Date  . Coronary artery bypass graft  2008    3 vessel, class 4 CHF, non Q wave MI 05/19/06  . Appendectomy  1957  . Cardiac defibrillator placement  01/30/2012    biventricular ICD  . Fracture surgery  1953; ~ 1962    left; left  . Cardiac defibrillator placement  01/30/2012    biventricular ICD  . Insert / replace / remove pacemaker  01/30/2012    biventricular ICD placed  . Bi-ventricular implantable cardioverter defibrillator upgrade N/A 01/30/2012    Procedure: BI-VENTRICULAR IMPLANTABLE CARDIOVERTER DEFIBRILLATOR UPGRADE;  Surgeon: Evans Lance, MD;  Location: [facility] CATH LAB;  Service: Cardiovascular;  Laterality: N/A;    Family History  Problem Relation Age of Onset  . Heart disease Father     MI age 30  . Prostate cancer Father   . Diabetes Sister   . Hyperlipidemia Sister   . Cancer Maternal Grandmother     liver, cirrhosis  . Depression Neg Hx   . Alcohol abuse Neg Hx   . Drug abuse Neg Hx   . Stroke Neg Hx   . Colon cancer Neg Hx     Social History:  reports that he has quit smoking. His smoking use included Cigarettes. He has a 17.5 pack-year smoking history. He has never used smokeless tobacco. He reports that he drinks about 4.2 oz of alcohol per week. He reports that he does not use illicit drugs.  Review of Systems:  He has been apparently more unstable on his feet and has fallen a few times.  Apparently has decreased sense of balance and has been reluctant to use a cane or walker as suggested by the family  He is also having other issues like intermittent diarrhea which he has not discussed with  PCP  Hypertension treated with Coreg and lisinopril low dose but blood pressure appears higher today This is followed by PCP and cardiologist   HYPERLIPIDEMIA:  Currently not taking his Crestor 40 mg  because of cost and has not taken this for a while LDL is markedly increased.  He does not understand the importance of treating hyperlipidemia even with his history of coronary artery disease Has been managed by PCP and cardiologist  Does not think he has had side effects from Lipitor previously  Lab Results  Component Value Date   CHOL 144 05/11/2013   HDL 38.40* 05/11/2013   LDLCALC 84 05/11/2013   LDLDIRECT 201.0 06/16/2014   TRIG 109.0 05/11/2013   CHOLHDL 4 05/11/2013   CKD: His creatinine is usually around 1.8-2, does followup with nephrologist periodically but no recent records available  Lab Results  Component Value Date   CREATININE 1.8* 11/24/2013        Examination:   BP 166/75 mmHg  Pulse 72  Temp(Src) 98.2 F (36.8 C)  Resp 16  Ht '5\' 8"'$  (1.727 m)  Wt 244 lb 12.8 oz (111.041 kg)  BMI 37.23 kg/m2  SpO2 96%  Body mass index is  37.23 kg/(m^2).   No  edema present Diabetic foot exam shows variable monofilament sensation in the toes and plantar surfaces, some areas have decreased sensation especially plantar surfaces.  Has no skin lesions or ulcers on the feet and normal pedal pulses  Romberg sign mildly positive  ASSESSMENT/ PLAN:   Diabetes type 2   His blood sugars are somewhat better but still mostly high after meals With this trying to take NovoLog more consistently at suppertime his blood sugars are not consistently high at night, previously were markedly increased at times He has not had an A1c checked recently Problems identified and discussed today are detailed in the history of present illness  Most likely because of his blood sugars being only transiently normal in the morning and higher the rest of the day he is not getting enough basal rate  later in the day with once a day Levemir; this was explained to him He will try to use 15 units twice a day of the LEVEMIR to start with; explained that he will need to continue taking NovoLog at breakfast also in the morning Also he does need more insulin to cover his evening meals better and will need at least 16 units of NOVOLOG He will need to try and be more regular with his mealtime doses at lunchtime Needs short-term visit in follow-up to further adjust his insulin doses, consider consultation with dietitian and nurse educator also  Although he is taking Victoza he is still having some postprandial hyperglycemia and not clear if he is benefiting enough; does have a little better weight on this visit  BALANCE issues: Not clear this is all related to neuropathy as he has only mild sensory loss and will benefit from a neurology consultation He will discuss this and his tendency to falls with PCP  HYPERLIPIDEMIA: His LDL is over 200 and he needs to take a statin drug, deferred to PCP and cardiologist   Patient Instructions  Novolog 12 units at Bfst and lunch (may use 10 if eating less) TAKE 14 BEFORE DINNER DAILY  Please check blood sugars at least half the time about 2 hours after any meal and 3 times per week on waking up. Please bring blood sugar monitor to each visit. Recommended blood sugar levels about 2 hours after meal is 140-180 and on waking up 90-130  LEVEMIR 15 UNITS TWICE DAILY AND NOT CHANGE THE DOSE, take regardless of food intake   Counseling time over 50% of today's 25 minute visit  Auriana Scalia 07/31/2014, 9:52 PM     No visits with results within 1 Week(s) from this visit. Latest known visit with results is:  Lab on 06/16/2014  Component Date Value Ref Range Status  . Direct LDL 06/16/2014 201.0   Final   Optimal:  <100 mg/dLNear or Above Optimal:  100-129 mg/dLBorderline High:  130-159 mg/dLHigh:  160-189 mg/dLVery High:  >190 mg/dL  . Color, Urine 06/16/2014  YELLOW  Yellow;Lt. Yellow Final  . APPearance 06/16/2014 CLEAR  Clear Final  . Specific Gravity, Urine 06/16/2014 1.025  1.000-1.030 Final  . pH 06/16/2014 5.5  5.0 - 8.0 Final  . Total Protein, Urine 06/16/2014 100* Negative Final  . Urine Glucose 06/16/2014 250* Negative Final  . Ketones, ur 06/16/2014 NEGATIVE  Negative Final  . Bilirubin Urine 06/16/2014 NEGATIVE  Negative Final  . Hgb urine dipstick 06/16/2014 NEGATIVE  Negative Final  . Urobilinogen, UA 06/16/2014 0.2  0.0 - 1.0 Final  . Leukocytes, UA 06/16/2014 NEGATIVE  Negative Final  . Nitrite 06/16/2014 NEGATIVE  Negative Final  . WBC, UA 06/16/2014 0-2/hpf  0-2/hpf Final  . Hyaline Casts, UA 06/16/2014 Presence of* None Final  . Fructosamine 06/16/2014 300* 190 - 270 umol/L Final  . Glucose, Bld 06/16/2014 94  70 - 99 mg/dL Final

## 2014-07-31 NOTE — Patient Instructions (Addendum)
Novolog 12 units at Bfst and lunch (may use 10 if eating less) TAKE 14 BEFORE DINNER DAILY  Please check blood sugars at least half the time about 2 hours after any meal and 3 times per week on waking up. Please bring blood sugar monitor to each visit. Recommended blood sugar levels about 2 hours after meal is 140-180 and on waking up 90-130  LEVEMIR 15 UNITS TWICE DAILY AND NOT CHANGE THE DOSE, take regardless of food intake

## 2014-08-01 ENCOUNTER — Inpatient Hospital Stay
Admit: 2014-08-01 | Disposition: A | Discharge status: Home / Self Care | Payer: Self-pay | Attending: Internal Medicine | Admitting: Internal Medicine

## 2014-08-01 ENCOUNTER — Telehealth: Payer: Self-pay | Admitting: Family Medicine

## 2014-08-01 LAB — COMPREHENSIVE METABOLIC PANEL
ALBUMIN: 3.6 g/dL
ALK PHOS: 56 U/L
ANION GAP: 8 (ref 7–16)
BILIRUBIN TOTAL: 0.6 mg/dL
BUN: 32 mg/dL — AB
CHLORIDE: 102 mmol/L
Calcium, Total: 8.7 mg/dL — ABNORMAL LOW
Co2: 26 mmol/L
Creatinine: 2.08 mg/dL — ABNORMAL HIGH
EGFR (African American): 35 — ABNORMAL LOW
EGFR (Non-African Amer.): 30 — ABNORMAL LOW
Glucose: 166 mg/dL — ABNORMAL HIGH
Potassium: 3.9 mmol/L
SGOT(AST): 17 U/L
SGPT (ALT): 12 U/L — ABNORMAL LOW
Sodium: 136 mmol/L
Total Protein: 7.3 g/dL

## 2014-08-01 LAB — BASIC METABOLIC PANEL
BUN: 24 mg/dL — ABNORMAL HIGH (ref 6–23)
CHLORIDE: 101 meq/L (ref 96–112)
CO2: 26 meq/L (ref 19–32)
CREATININE: 1.73 mg/dL — AB (ref 0.40–1.50)
Calcium: 9.3 mg/dL (ref 8.4–10.5)
GFR: 40.84 mL/min — AB (ref >60.00)
GLUCOSE: 174 mg/dL — AB (ref 70–99)
POTASSIUM: 4.2 meq/L (ref 3.5–5.1)
Sodium: 138 mEq/L (ref 135–145)

## 2014-08-01 LAB — CBC
HCT: 36.9 % — ABNORMAL LOW (ref 40.0–52.0)
HGB: 12 g/dL — AB (ref 13.0–18.0)
MCH: 29.4 pg (ref 26.0–34.0)
MCHC: 32.5 g/dL (ref 32.0–36.0)
MCV: 90 fL (ref 80–100)
Platelet: 217 10*3/uL (ref 150–440)
RBC: 4.09 10*6/uL — ABNORMAL LOW (ref 4.40–5.90)
RDW: 14.2 % (ref 11.5–14.5)
WBC: 9.3 10*3/uL (ref 3.8–10.6)

## 2014-08-01 LAB — HEMOGLOBIN A1C: Hgb A1c MFr Bld: 7.6 % — ABNORMAL HIGH (ref 4.6–6.5)

## 2014-08-01 LAB — LIPASE, BLOOD: Lipase: 33 U/L

## 2014-08-01 LAB — LDL CHOLESTEROL, DIRECT: Direct LDL: 94 mg/dL

## 2014-08-01 NOTE — Telephone Encounter (Signed)
No answer. Will try again later. 

## 2014-08-01 NOTE — Telephone Encounter (Signed)
Patient Name: [name] [name]  DOB: 04/18/1934    Initial Comment Caller states her husband has had diarrhea and runny stools. He vomited last night and this morning. He also is shaking    Nurse Assessment  Nurse: Mallie Mussel, RN, Alveta Heimlich Date/Time Eilene Ghazi Time): 08/01/2014 11:05:42 AM  Confirm and document reason for call. If symptomatic, describe symptoms. ---Caller states that her husband has had diarrhea for longer than a week. He has had diarrhea x 4 in the last 24 hours. He vomited last night and this morning. He "is burning up but shivering so bad." He is so weak, he can't turn himself in bed. This is new and different. He last urinated this morning. He is incontinent of urine and bowel. This is also new and different.  Has the patient traveled out of the country within the last 30 days? ---No  Does the patient require triage? ---Yes  Related visit to physician within the last 2 weeks? ---No  Does the PT have any chronic conditions? (i.e. diabetes, asthma, etc.) ---Yes  List chronic conditions. ---HTN, Diabetes, Hypercholesterolemia, Implanted Defibrillator/Pacemaker     Guidelines    Guideline Title Affirmed Question Affirmed Notes  Vomiting High-risk adult (e.g., diabetes mellitus, brain tumor, V-P shunt, hernia)    Final Disposition User   Go to ED Now (or PCP triage) Mallie Mussel, RN, Alveta Heimlich    Comments  I opted to go ahead and recommend ER for this patient. He has become very weak, cannot roll over in bed on his own and has increased problems with bowel and bladder incontinence. Caller indicated he is weak enough for her to have to call 911 to get h im seen.

## 2014-08-01 NOTE — Telephone Encounter (Signed)
Called patient to follow up.  Spoke to wife, Augusten Lipkin, who is taking him to the ED at [facility] now.

## 2014-08-02 LAB — BASIC METABOLIC PANEL
Anion Gap: 8 (ref 7–16)
BUN: 28 mg/dL — ABNORMAL HIGH
CO2: 25 mmol/L
Calcium, Total: 8.1 mg/dL — ABNORMAL LOW
Chloride: 106 mmol/L
Creatinine: 1.64 mg/dL — ABNORMAL HIGH
EGFR (African American): 46 — ABNORMAL LOW
GFR CALC NON AF AMER: 40 — AB
GLUCOSE: 135 mg/dL — AB
Potassium: 3.6 mmol/L
Sodium: 139 mmol/L

## 2014-08-02 NOTE — Telephone Encounter (Signed)
See other phone note. Patient coming in for hospital follow-up 08/04/14.

## 2014-08-02 NOTE — Telephone Encounter (Signed)
Records from [facility] on your desk.

## 2014-08-02 NOTE — Telephone Encounter (Signed)
Please see what info you can get.  Thanks.

## 2014-08-02 NOTE — Telephone Encounter (Signed)
Noted, thanks!

## 2014-08-02 NOTE — Telephone Encounter (Signed)
Faxed request to [facility] for hospital records.

## 2014-08-02 NOTE — Telephone Encounter (Signed)
Spoke to patient's wife and was advised that he stayed in the hospital one night. Patient's wife stated that he was dehydrated and was given IV fluids. Patient states that he is feeling better, but wife feels that he needs to come in to discuss his recent falls. Follow-up appointment scheduled for 08/04/14. Will send for hospital records.

## 2014-08-02 NOTE — Telephone Encounter (Signed)
Thanks

## 2014-08-04 ENCOUNTER — Encounter: Payer: Self-pay | Admitting: Family Medicine

## 2014-08-04 ENCOUNTER — Ambulatory Visit (INDEPENDENT_AMBULATORY_CARE_PROVIDER_SITE_OTHER): Payer: PPO | Admitting: Family Medicine

## 2014-08-04 VITALS — BP 152/70 | HR 91 | Temp 99.0°F | Wt 244.2 lb

## 2014-08-04 DIAGNOSIS — N183 Chronic kidney disease, stage 3 unspecified: Secondary | ICD-10-CM

## 2014-08-04 DIAGNOSIS — R197 Diarrhea, unspecified: Secondary | ICD-10-CM | POA: Diagnosis not present

## 2014-08-04 DIAGNOSIS — R296 Repeated falls: Secondary | ICD-10-CM | POA: Diagnosis not present

## 2014-08-04 LAB — BASIC METABOLIC PANEL
BUN: 19 mg/dL (ref 6–23)
CO2: 24 mEq/L (ref 19–32)
Calcium: 8.7 mg/dL (ref 8.4–10.5)
Chloride: 104 mEq/L (ref 96–112)
Creat: 1.43 mg/dL — ABNORMAL HIGH (ref 0.50–1.35)
Glucose, Bld: 169 mg/dL — ABNORMAL HIGH (ref 70–99)
Potassium: 3.8 mEq/L (ref 3.5–5.3)
Sodium: 139 mEq/L (ref 135–145)

## 2014-08-04 NOTE — Assessment & Plan Note (Signed)
Monofilament exam wnl, romberg neg, would refer to PT for balance training and strengthening.  He agrees.

## 2014-08-04 NOTE — Patient Instructions (Signed)
Go to the lab on the way out.  We'll contact you with your lab report. Continue lisinopril '10mg'$  a day for now (until I can see your labs and to see if the lower dose affects the diarrhea- though I don't think the higher dose caused the diarrhea). Take care.  Glad to see you.

## 2014-08-04 NOTE — Assessment & Plan Note (Signed)
Benign abd exam, check bmet and basic stool labs today.  D/w pt.  He agrees.  He attributes diarrhea to inc in ACE.  I doubt this, it isn't likely; in any event would continue lisinopril '10mg'$  day for now (instead of '20mg'$ ) until I can get his Cr/K back and for him to see if he improves.  He agrees.  >25 minutes spent in face to face time with patient, >50% spent in counselling or coordination of care.

## 2014-08-04 NOTE — Progress Notes (Signed)
Pre visit review using our clinic review tool, if applicable. No additional management support is needed unless otherwise documented below in the visit note.  Diarrhea has been going on for at least 2 weeks before the recent [facility] admission.  More recently, just before admission, had some vomiting.  Admitted, elevated Cr, given IVF, was able to be discharged.  No more vomiting, normal BM after discharge from [facility]  Then diarrhea restarted again after that.    He had diarrhea start after inc in lisinopril to '20mg'$ , but that wasn't clearly causative.    LDL better back on crestor, had been off prev.  Last LDL was improved, d/w pt.    Multiple falls.  Worse in the last month.   Had fallen 4 times in 1 week.  He can still feel the floor with his feet and doesn't endorse balance troubles.    Meds, vitals, and allergies reviewed.   ROS: See HPI.  Otherwise, noncontributory.  nad ncat Mmm rrr ctab abd soft Ext w/o edema.   Romberg neg  Diabetic foot exam: Normal inspection No skin breakdown No calluses  Normal DP pulses Normal sensation to light touch and monofilament Nails normal

## 2014-08-04 NOTE — Assessment & Plan Note (Signed)
With prev elevated Cr, recheck today.  Labs pending.  D/w pt.

## 2014-08-06 NOTE — Discharge Summary (Signed)
PATIENT NAME:  [name] [name], [name] [name] MR#:  [id number] DATE OF BIRTH:  08-23-1934  DATE OF ADMISSION:  08/01/2014 DATE OF DISCHARGE:  08/02/2014  PRIMARY CARE PHYSICIAN:  Dr.  Council Mechanic.   DISCHARGE DIAGNOSIS:  Gastroenteritis, acute renal failure on chronic kidney disease, stage III; chronic systolic congestive heart failure; diabetes, hypertension, coronary artery disease.   CONDITION:  Stable.   CODE STATUS:  Full code.   HOME MEDICATIONS:  Please refer to the medication reconciliation list.   DIET:  Low-sodium, low-fat, low-cholesterol, ADA diet.   ACTIVITY:  As tolerated.   FOLLOWUP CARE:  Follow up with PCP within 1-2 weeks.   REASON FOR ADMISSION:  Vomiting and diarrhea.   HOSPITAL COURSE:   1. The patient is a 80 year old Caucasian male with a history of CAD, CKD, hypertension, and diabetes; came to ED due to vomiting and diarrhea, 4-5 episodes.  The patient was admitted for gastroenteritis.  After admission, the patient was kept n.p.o. with IV fluid support and Zofran p.r.n.  The patient's symptoms have much improved.  He had no more nausea, vomiting, or diarrhea and had no bowel movement after admission.  The patient was started on diet this morning.  He tolerated his diet.  2. Acute renal failure on CKD, stage III.  Patient's creatinine was 2.08 and decreased to 1.64.  The patient was treated with IV fluid support 1 liter.  3. Chronic systolic CHF, stable.   The patient is clinically stable and will be discharged to home today.  Discussed the patient's discharge plan with the patient, nurse, and case manager.   TIME SPENT:  About 37 minutes.    ____________________________ Demetrios Loll, MD qc:kc D: 08/02/2014 16:08:04 ET T: 08/02/2014 22:31:01 ET JOB#: [id number]  cc: Demetrios Loll, MD, <Dictator> Demetrios Loll MD ELECTRONICALLY SIGNED 08/03/2014 19:28

## 2014-08-06 NOTE — H&P (Signed)
PATIENT NAME:  [name], [name] MR#:  [id number] DATE OF BIRTH:  03/15/1935  DATE OF ADMISSION:  08/01/2014  PRIMARY CARE PROVIDER:  Teresa Pelton.    NEPHROLOGIST:  Dr. Juleen China.   CARDIOLOGIST:  Dr. Aundra Dubin.   CHIEF COMPLAINT:  Diarrhea, vomiting.   HISTORY OF PRESENT ILLNESS:  A 80 year old Caucasian male patient with history of diabetes; hypertension; CKD, stage III; chronic systolic CHF with ejection fraction of 25%; and CAD; presents to the Emergency Room complaining of 3 days of watery diarrhea.  The patient mentions that for the last 1 week, he has had recurrent soft bowel movements but over the last few days, he has had 4-5 episodes of watery diarrhea where he has also had bowel incontinence due to the watery diarrhea.  He had 2-3 episodes of projectile vomiting today and presented to the Emergency Room.  He is accompanied by his wife.  The patient has been found to have acute renal failure with creatinine worsening to 2, and on further discussion with Dr. Holley Raring, the patient is being admitted to the hospital.  The patient is afebrile and has normal white count.  He does not complain of any abdominal pain.  He had some mild lower abdominal pain, according to his wife, 2 days back but this has resolved.   The patient had recently attended a funeral and was exposed to Clostridium difficile per wife.   PAST MEDICAL HISTORY:  1. Hypertension.  2. CAD.   3. Diabetes.  4. AICD/pacemaker placement.  5. CABG.  6. Chronic systolic CHF with ejection fraction of 25%.   ALLERGIES:  SULFA.   HOME MEDICATIONS:  1. Torsemide 20 mg 2 tablets once a day.  2. Simvastatin 40 mg once a day.  3. NovoLog sliding scale.  4. Nitroglycerin 0.4 sublingual as needed for chest pain.  5. Lisinopril 10 mg daily.  6. Liraglutide 1.2 mg daily.   7. Levemir 18-30 units subcutaneous once a day.  8. Coreg 25 mg 1-1/2 tablets oral 2 times a day.  9. Aspirin 81 mg daily.   REVIEW OF SYSTEMS;  CONSTITUTIONAL:   Complains of fatigue.  EYES:  No blurred vision or pain.  EARS, NOSE AND THROAT:  No tinnitus, pain, or hearing loss.  RESPIRATORY:  No cough, wheeze, hemoptysis.  CARDIOVASCULAR:  No chest pain, orthopnea, or edema.  GASTROINTESTINAL:  Has nausea, vomiting, and diarrhea,  GENITOURINARY:  No dysuria, hematuria, or frequency.  ENDOCRINE:  No polyuria, nocturia, thyroid problems.   HEMATOLOGIC AND LYMPHATIC:  No anemia, easy bruising or bleeding.   INTEGUMENTARY:  No acne, rash, lesion.  MUSCULOSKELETAL:  No back pain or arthritis.  NEUROLOGIC:  No focal numbness, weakness.  PSYCHIATRIC:  No anxiety, depression.   FAMILY HISTORY:  No family history of colon cancer.   PHYSICAL EXAMINATION:  VITAL SIGNS:  Blood pressure of 121/68, pulse of 69, temperature 98.5, saturating 98% on room air.  GENERAL:  Moderately obese, Caucasian male patient lying in bed.  Overall seems comfortable.  PSYCHIATRIC:  Alert, oriented x 3, pleasant.  HEENT:  Atraumatic, normocephalic.  Oral mucosa moist and pink.  External ears and nose normal.  No pallor.  No icterus.  Pupils bilaterally equal and reactive to light.  NECK:  Supple.  No thyromegaly.  No palpable lymph nodes.  Trachea midline.  No carotid bruit or JVD.  CARDIOVASCULAR:  S1, S2, without any murmurs.  Peripheral pulses 2+.  No edema.  RESPIRATORY:  Normal work of breathing.  Clear to auscultation  on both sides.  GASTROINTESTINAL:  Soft abdomen, nontender.  Bowel sounds present.  No hepatosplenomegaly palpable.  GENITOURINARY:   No CVA tenderness or bladder distention.  SKIN:  Warm and dry.  No petechiae, rash, ulcers.  MUSCULOSKELETAL:  No joint swelling, redness, or effusion of the large joints.  Normal muscle tone.  NEUROLOGICAL:  Motor strength 5/5 in upper extremities.  Sensation to fine touch intact. LYMPHATIC:   No cervical lymphadenopathy.   LABORATORY DATA:  Glucose 166, BUN 32, creatinine 2.08, sodium 136, potassium 3.9, chloride 102, GFR  30.   AST, ALT, alkaline phosphatase, and bilirubin normal.  WBC 9.3, hemoglobin 12, platelets of 217,000.   ASSESSMENT AND PLAN:  1. Gastroenteritis.  Possible causes at this point seem to be viral but will have to check for Clostridium difficile with the significant amount of diarrhea and vomiting the patient has had causing acute renal failure.  Will check for Clostridium difficile , put him on isolation, send for stool cultures and stool wbc.  No Imodium at this point until we rule out infection.  No antibiotics needed.  No abdominal pain.  No fever.  No white count.  2. Acute renal failure over kidney chronic kidney disease, stage III.  The patient's creatinine seems to have worsened per discussion with Dr. Holley Raring.  At this point he will be hydrated with IV fluids.  Considering his low ejection fraction of 25%, I will limit his fluids to 1 liter at 100 mL per hour.  Repeat BMP in the morning.  If there is any worsening, will need to consult nephrology. 3. Diabetes mellitus.  Put him on sliding scale insulin.  Continue his home dose of Levemir.  4. Hypertension.  Continue medications except his lisinopril due to acute renal failure.  5. Chronic systolic congestive heart failure.  Hold patient's torsemide due to acute renal failure and monitor for any fluid overload.  He will be on IV fluids.  6. Deep vein thrombosis prophylaxis.  With Lovenox.   CODE STATUS:  Full code.   TIME SPENT TODAY ON THIS CASE:  Forty-five minutes.    ____________________________ Leia Alf Emrah Ariola, MD srs:kc D: 08/01/2014 21:34:03 ET T: 08/01/2014 22:05:50 ET JOB#: [id number]  cc: Alveta Heimlich R. Darvin Neighbours, MD, <Dictator> Modesto Charon, MD Larey Dresser, MD Mamie Levers, MD  Neita Carp MD ELECTRONICALLY SIGNED 08/04/2014 16:24

## 2014-08-07 ENCOUNTER — Telehealth: Payer: Self-pay | Admitting: *Deleted

## 2014-08-07 DIAGNOSIS — I5022 Chronic systolic (congestive) heart failure: Secondary | ICD-10-CM

## 2014-08-07 NOTE — Telephone Encounter (Signed)
-----   Message from Josetta Huddle, [place] sent at 08/07/2014  9:14 AM EDT ----- Left message on patient's voicemail to return call.  ----- Message -----    From: Tonia Ghent, MD    Sent: 08/06/2014  10:15 PM      To: Josetta Huddle, CMA  See result note.  Thanks.

## 2014-08-07 NOTE — Telephone Encounter (Signed)
Patient states he started back taking Lisinopril 20 mg this morning and will continue to do so.  The diarrhea is better.  He did have one other bout of it but took [employer] and it stopped.  Also, Ms. Ruland states that they were supposed to have gotten the stool kit at the lab but they took blood and didn't give them anything else.  Does the patient still need to do that?

## 2014-08-08 MED ORDER — LISINOPRIL 20 MG PO TABS: 20.0000 mg | ORAL_TABLET | Freq: Every day | ORAL | Status: DC

## 2014-08-08 NOTE — Telephone Encounter (Signed)
If the diarrhea is resolved, then I wouldn't get the stool studies done.  I left the order in- if sx return then have him pick up the kit. Med list updated.

## 2014-08-08 NOTE — Addendum Note (Signed)
Addended by: Tonia Ghent on: 08/08/2014 11:55 AM   Modules accepted: Orders

## 2014-08-08 NOTE — Telephone Encounter (Signed)
Left detailed message on voicemail.  

## 2014-08-10 ENCOUNTER — Ambulatory Visit: Payer: PPO | Admitting: Family Medicine

## 2014-08-11 ENCOUNTER — Encounter: Payer: Self-pay | Admitting: Family Medicine

## 2014-08-11 ENCOUNTER — Ambulatory Visit (INDEPENDENT_AMBULATORY_CARE_PROVIDER_SITE_OTHER): Payer: PPO | Admitting: *Deleted

## 2014-08-11 ENCOUNTER — Encounter: Payer: Self-pay | Admitting: Internal Medicine

## 2014-08-11 DIAGNOSIS — I5022 Chronic systolic (congestive) heart failure: Secondary | ICD-10-CM

## 2014-08-11 DIAGNOSIS — Z95810 Presence of automatic (implantable) cardiac defibrillator: Secondary | ICD-10-CM

## 2014-08-12 LAB — CUP PACEART INCLINIC DEVICE CHECK
Battery Voltage: 3.03 V
Brady Statistic AP VP Percent: 0.2 %
Brady Statistic AS VP Percent: 99.6 %
Date Time Interrogation Session: 20160507123601
Lead Channel Impedance Value: 437 Ohm
Lead Channel Impedance Value: 494 Ohm
Lead Channel Pacing Threshold Amplitude: 0.75 V
Lead Channel Pacing Threshold Amplitude: 1 V
Lead Channel Pacing Threshold Pulse Width: 0.4 ms
Lead Channel Sensing Intrinsic Amplitude: 2.6 mV
Lead Channel Sensing Intrinsic Amplitude: 20 mV
Lead Channel Setting Pacing Amplitude: 1.75 V
Lead Channel Setting Pacing Amplitude: 2.5 V
MDC IDC MSMT LEADCHNL LV IMPEDANCE VALUE: 437 Ohm
MDC IDC MSMT LEADCHNL LV PACING THRESHOLD PULSEWIDTH: 0.4 ms
MDC IDC MSMT LEADCHNL RA PACING THRESHOLD AMPLITUDE: 1.125 V
MDC IDC MSMT LEADCHNL RV PACING THRESHOLD PULSEWIDTH: 0.4 ms
MDC IDC SET LEADCHNL LV PACING PULSEWIDTH: 0.4 ms
MDC IDC SET LEADCHNL RA PACING AMPLITUDE: 2 V
MDC IDC SET LEADCHNL RV PACING PULSEWIDTH: 0.4 ms
MDC IDC SET LEADCHNL RV SENSING SENSITIVITY: 0.3 mV
MDC IDC SET ZONE DETECTION INTERVAL: 290 ms
MDC IDC SET ZONE DETECTION INTERVAL: 340 ms
MDC IDC SET ZONE DETECTION INTERVAL: 350 ms
MDC IDC STAT BRADY AP VS PERCENT: 0.1 % — AB
MDC IDC STAT BRADY AS VS PERCENT: 0.2 %
Zone Setting Detection Interval: 400 ms

## 2014-08-12 NOTE — Progress Notes (Signed)
CRT-D device check in office. Thresholds and sensing consistent with previous device measurements. Lead impedance trends stable over time. No mode switch episodes recorded. 4 NSVT---longest 20 beats. Patient bi-ventricularly pacing 99% of the time. Device programmed with appropriate safety margins---changed RA output from 2.25 to 2.0V. Heart failure diagnostics reviewed and trends are stable for patient. Remaining power 3.03V. ROV w/ device clinic in 39mo& w/ SK in 663mo

## 2014-08-16 ENCOUNTER — Other Ambulatory Visit: Payer: Self-pay | Admitting: Family Medicine

## 2014-08-16 NOTE — Telephone Encounter (Signed)
Received refill request electronically from pharmacy. No longer on medication list. Last office visit 08/04/14 Is it okay to refill medication?

## 2014-08-17 NOTE — Telephone Encounter (Signed)
If he can't provide more clarity than that, then he needs OV to discuss.  He already has rx for valium.  I denied the rx.

## 2014-08-17 NOTE — Telephone Encounter (Signed)
Patient says he did request the refill of the medication.  I asked if he is having trouble sleeping and he said "maybe, yes".

## 2014-08-17 NOTE — Telephone Encounter (Signed)
Looks like he hasn't been on med in years.  Please clarify with patient. Thanks.

## 2014-08-17 NOTE — Telephone Encounter (Signed)
Electronic refill request. Last Filled:   01/06/2012.  Medication is not on current meds list. Please advise.

## 2014-08-23 ENCOUNTER — Ambulatory Visit: Payer: PPO | Admitting: Physical Therapy

## 2014-08-30 ENCOUNTER — Other Ambulatory Visit: Payer: Self-pay | Admitting: Family Medicine

## 2014-08-30 DIAGNOSIS — R0602 Shortness of breath: Secondary | ICD-10-CM

## 2014-09-01 ENCOUNTER — Other Ambulatory Visit (INDEPENDENT_AMBULATORY_CARE_PROVIDER_SITE_OTHER): Payer: PPO

## 2014-09-01 ENCOUNTER — Other Ambulatory Visit: Payer: PPO

## 2014-09-01 DIAGNOSIS — R0602 Shortness of breath: Secondary | ICD-10-CM | POA: Diagnosis not present

## 2014-09-01 LAB — BASIC METABOLIC PANEL
BUN: 18 mg/dL (ref 6–23)
CALCIUM: 9.4 mg/dL (ref 8.4–10.5)
CHLORIDE: 99 meq/L (ref 96–112)
CO2: 29 mEq/L (ref 19–32)
CREATININE: 1.53 mg/dL — AB (ref 0.40–1.50)
GFR: 47.05 mL/min — ABNORMAL LOW (ref >60.00)
Glucose, Bld: 137 mg/dL — ABNORMAL HIGH (ref 70–99)
POTASSIUM: 3.9 meq/L (ref 3.5–5.1)
Sodium: 134 mEq/L — ABNORMAL LOW (ref 135–145)

## 2014-09-01 LAB — BRAIN NATRIURETIC PEPTIDE: PRO B NATRI PEPTIDE: 706 pg/mL — AB (ref 0.0–100.0)

## 2014-09-07 ENCOUNTER — Encounter: Payer: Self-pay | Admitting: Family Medicine

## 2014-09-07 ENCOUNTER — Ambulatory Visit (INDEPENDENT_AMBULATORY_CARE_PROVIDER_SITE_OTHER): Payer: PPO | Admitting: Family Medicine

## 2014-09-07 VITALS — BP 122/58 | HR 73 | Temp 98.6°F | Ht 68.0 in | Wt 245.0 lb

## 2014-09-07 DIAGNOSIS — N183 Chronic kidney disease, stage 3 unspecified: Secondary | ICD-10-CM

## 2014-09-07 DIAGNOSIS — G4700 Insomnia, unspecified: Secondary | ICD-10-CM

## 2014-09-07 DIAGNOSIS — R296 Repeated falls: Secondary | ICD-10-CM | POA: Diagnosis not present

## 2014-09-07 DIAGNOSIS — L089 Local infection of the skin and subcutaneous tissue, unspecified: Secondary | ICD-10-CM | POA: Diagnosis not present

## 2014-09-07 NOTE — Patient Instructions (Signed)
Take care.  Glad to see you.  I wouldn't change your meds at this point.  I'll send a note to Dr. Juleen China.   Take diazepam at night if needed.

## 2014-09-07 NOTE — Progress Notes (Signed)
Pre visit review using our clinic review tool, if applicable. No additional management support is needed unless otherwise documented below in the visit note.  Recently started on doxy for presumed staph infection on R arm, per outside clinic.  No fevers.    Falls d/w pt.  Better per patient, but has PT pending, ie to start next week.  D/w pt.    Diarrhea is resolved. "I feel good."   H/o CHF, BNP slightly up but no BLE edema. Not SOB per patient.  No sig weight changes.  D/w pt about not changing meds at this point.   Insomnia d/w pt.  Still with trouble getting to sleep.  D/w pt about fall risk.  His wife's TV is keeping him up, d/w him about sleeping in a different/quiet room.  He has used 1/2 tab diazepam in the past w/o ADE to help with ringing in the ears, to help with sleep.    Meds, vitals, and allergies reviewed.   ROS: See HPI.  Otherwise, noncontributory.  nad ncat Mmm Neck supple, no LA rrr ctab abd soft, not ttp No edema in BLE Small cluster of superficial pustules on the L forearm noted.

## 2014-09-10 DIAGNOSIS — L089 Local infection of the skin and subcutaneous tissue, unspecified: Secondary | ICD-10-CM | POA: Insufficient documentation

## 2014-09-10 NOTE — Assessment & Plan Note (Signed)
Just started doxy, he should continue.

## 2014-09-10 NOTE — Assessment & Plan Note (Signed)
PT pending.

## 2014-09-10 NOTE — Assessment & Plan Note (Signed)
Needs environmental changes more than meds changes at this point.

## 2014-09-10 NOTE — Assessment & Plan Note (Signed)
Reviewed with patient.  With Cr stable, no BLE edema, lack of rales, wouldn't change med.  BNP elevation noted, but this may be his new baseline.  D/w pt.  Will route info to renal as a FYI.  >25 minutes spent in face to face time with patient, >50% spent in counselling or coordination of care

## 2014-09-11 ENCOUNTER — Ambulatory Visit (INDEPENDENT_AMBULATORY_CARE_PROVIDER_SITE_OTHER): Payer: PPO | Admitting: Endocrinology

## 2014-09-11 ENCOUNTER — Other Ambulatory Visit (HOSPITAL_COMMUNITY): Payer: Self-pay | Admitting: *Deleted

## 2014-09-11 VITALS — BP 142/70 | HR 77 | Temp 98.0°F | Resp 18 | Ht 69.0 in | Wt 245.0 lb

## 2014-09-11 DIAGNOSIS — E1165 Type 2 diabetes mellitus with hyperglycemia: Secondary | ICD-10-CM | POA: Diagnosis not present

## 2014-09-11 DIAGNOSIS — IMO0002 Reserved for concepts with insufficient information to code with codable children: Secondary | ICD-10-CM

## 2014-09-11 DIAGNOSIS — I5022 Chronic systolic (congestive) heart failure: Secondary | ICD-10-CM

## 2014-09-11 MED ORDER — TORSEMIDE 20 MG PO TABS: 40.0000 mg | ORAL_TABLET | Freq: Every day | ORAL | Status: DC

## 2014-09-11 NOTE — Progress Notes (Signed)
Patient ID: [name] [name], male   DOB: 09/02/1934, 80 y.o.   MRN: [id number]   Reason for Appointment: Diabetes follow-up   History of Present Illness   Diagnosis: Type 2 DIABETES MELITUS, date of diagnosis: 2002   He has been on insulin since about 2008 with variable control. In 04/2012 his A1c was 10.1% His diabetes control was significantly better with adding Victoza in early 2014 and adjusting his dose of Levemir Also was able to take lower doses of Levemir than before  Recent history:  Insulin regimen: Levemir 15 units twice daily and NovoLog 10-12  units before meals    irreg victoz He is here for short-term follow-up after his blood sugars were looking inadequately controlled on his last visit in 4/60 Previously despite clear instructions for insulin doses he would take arbitrary doses of both the basal and bolus insulins He was advised to take Levemir twice a day instead of once a day and also to increase the Novolog at suppertime at least  Problems identified and current that sugar patterns:  He is checking his blood sugars mostly mid day and difficult to get a proper pattern of his blood sugars  Although he is generally not eating his first meal till about 11 AM he is usually having higher readings early in the morning except yesterday  Blood sugars are variably high after his first meal and he thinks that if he is eating sweets his blood sugars be much higher; he cannot explain a blood sugar of 401 at noon last month  Most of his blood sugars at BEDTIME are fairly high 90s are about 3-4 hours after eating  NOVOLOG: Despite increasing his dose to 16 units for suppertime he is still taking only 10-12 units  He will not take his Novolog until he comes back from the restaurant when he is eating out  His overall average blood sugars are not improved, previously averaging 178 and now 189  He thinks he is taking Victoza more regularly recently but this has been  intermittent and he will take it only when he can afford it  He says he has difficulty losing weight and is usually trying to avoid high-fat foods and drinks with sugar  Oral hypoglycemic drugs: None         Side effects from medications: None Proper timing of medications in relation to meals:  only if eating at home .         Monitors blood glucose: Once a day or less .    Glucometer: One Touch   Meals: 2-3 meals per day.eating breakfast at 11 am with either oatmeal/egg or apple/peanut butter; lunch 3 PM dinner 7 pm             Blood Glucose readings from meter download as follows  Mean values apply above for all meters except median for One Touch  PRE-MEAL Fasting  1-2 PM  Dinner Bedtime Overall  Glucose range:  137-191   136-401  ?  136  192-239   Mean/median:     167   Hypoglycemia frequency:  none recently.               Physical activity: exercise: Walking 15 min on some days and physical therapy           Dietician visit: Most recent: 7741           Complications: are: ? Nephropathy     Wt Readings from Last 3 Encounters:  09/11/14 245 lb (111.131 kg)  09/07/14 245 lb (111.131 kg)  08/04/14 244 lb 4 oz (110.791 kg)   Lab Results  Component Value Date   HGBA1C 7.6* 07/31/2014   HGBA1C 8.5* 11/24/2013   HGBA1C 7.8* 08/25/2013   Lab Results  Component Value Date   MICROALBUR 9.6* 01/25/2013   LDLCALC 84 05/11/2013   CREATININE 1.53* 09/01/2014     OTHER problems: Reviewed in ROS especially recent problem of balance difficulty and falls    Medication List       This list is accurate as of: 09/11/14  9:13 PM.  Always use your most recent med list.               aspirin 81 MG tablet  Take 81 mg by mouth every other day.     carvedilol 25 MG tablet  Commonly known as:  COREG  TAKE 1 AND 1/2 TABLETS BY MOUTH TWICE A DAY     diazepam 10 MG tablet  Commonly known as:  VALIUM  TAKE 1/2 TO 1 TABLET BY MOUTH AT BEDTIMEAS NEEDED FOR ANXIETY OR RINGING IN EARS       doxycycline 100 MG tablet  Commonly known as:  VIBRA-TABS  Take 100 mg by mouth 2 (two) times daily.     glucose blood test strip  Commonly known as:  ONETOUCH VERIO  Use as instructed to check blood sugar 3 times per day dx code E11.29     insulin detemir 100 UNIT/ML injection  Commonly known as:  LEVEMIR  Inject 28-30 Units into the skin at bedtime. Add 1 unit every day until AM sugar is less than 150     Liraglutide 18 MG/3ML Sopn  Commonly known as:  VICTOZA  Inject 1.2 mg into the skin daily.     lisinopril 20 MG tablet  Commonly known as:  PRINIVIL,ZESTRIL  Take 1 tablet (20 mg total) by mouth daily.     nitroGLYCERIN 0.4 MG SL tablet  Commonly known as:  NITROSTAT  Place 1 tablet (0.4 mg total) under the tongue every 5 (five) minutes as needed. For chest pain     NOVOLOG FLEXPEN 100 UNIT/ML FlexPen  Generic drug:  insulin aspart  USE AS DIRECTED PER SLIDING SCALE. SUGAR100-150=10UNITS, 150-200=12UNITS, 200-250=14UNITS, 250+=16UNITS. USE 3 TIMES DAILY     ONETOUCH DELICA LANCETS FINE Misc  Use to check blood sugar 3 times per day dx code 250.42     rosuvastatin 40 MG tablet  Commonly known as:  CRESTOR  Take 1 tablet (40 mg total) by mouth daily.     torsemide 20 MG tablet  Commonly known as:  DEMADEX  Take 2 tablets (40 mg total) by mouth daily.        Allergies:  Allergies  Allergen Reactions  . Sulfa Antibiotics Swelling and Rash    Hands swell    Past Medical History  Diagnosis Date  . COPD (chronic obstructive pulmonary disease) 1993  . Hyperlipidemia 1993  . Hypertension 1993  . CAD (coronary artery disease)     bypass graft surgery 05/2006; grafts were widely patent on cath 06/2010   . Ischemic cardiomyopathy     EF 25-30%; s/p single chamber ICD 2010; upgrade to [employer] BiV ICD 01/30/12  . Systolic heart failure     class II  . Foot fracture 1953; 1962    left; left  . Chronic kidney disease   . Myocardial infarction 2008; 01/06/2012  .  Syncope and collapse 01/05/2012  .  Shortness of breath 01/05/2012    "all the time; been going on for awhile"  . Diabetes mellitus, type 2 2000  . Arthritis     "hands" (01/06/2012)  . Elbow mass     left; "just noticed it ~ 2 wk ago"  (01/06/2012)  . Anxiety   . Nightmares     with psych eval 2012 at [facility]  . PTSD (post-traumatic stress disorder)   . Tinnitus of left ear   . 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 Boil     "fluid boil left arm" (01/30/2012)  . LBBB (left bundle branch block)     Past Surgical History  Procedure Laterality Date  . Coronary artery bypass graft  2008    3 vessel, class 4 CHF, non Q wave MI 05/19/06  . Appendectomy  1957  . Cardiac defibrillator placement  01/30/2012    biventricular ICD  . Fracture surgery  1953; ~ 1962    left; left  . Cardiac defibrillator placement  01/30/2012    biventricular ICD  . Insert / replace / remove pacemaker  01/30/2012    biventricular ICD placed  . Bi-ventricular implantable cardioverter defibrillator upgrade N/A 01/30/2012    Procedure: BI-VENTRICULAR IMPLANTABLE CARDIOVERTER DEFIBRILLATOR UPGRADE;  Surgeon: Evans Lance, MD;  Location: [facility] CATH LAB;  Service: Cardiovascular;  Laterality: N/A;    Family History  Problem Relation Age of Onset  . Heart disease Father     MI age 60  . Prostate cancer Father   . Diabetes Sister   . Hyperlipidemia Sister   . Cancer Maternal Grandmother     liver, cirrhosis  . Depression Neg Hx   . Alcohol abuse Neg Hx   . Drug abuse Neg Hx   . Stroke Neg Hx   . Colon cancer Neg Hx     Social History:  reports that he has quit smoking. His smoking use included Cigarettes. He has a 17.5 pack-year smoking history. He has never used smokeless tobacco. He reports that he drinks about 4.2 oz of alcohol per week. He reports that he does not use illicit drugs.  Review of Systems:  Hypertension treated with Coreg and lisinopril low dose This is followed by PCP and  cardiologist   HYPERLIPIDEMIA:  Currently  taking his Crestor 40 mg and his last LDL was below 100 Needs aggressive treatment because of his history of coronary artery disease Has been managed by PCP and cardiologist  Does not think he has had side effects from Lipitor previously  Lab Results  Component Value Date   CHOL 144 05/11/2013   HDL 38.40* 05/11/2013   LDLCALC 84 05/11/2013   LDLDIRECT 94.0 07/31/2014   TRIG 109.0 05/11/2013   CHOLHDL 4 05/11/2013   CKD: His creatinine is usually mildly high, he does followup with nephrologist periodically but no recent records available  Lab Results  Component Value Date   CREATININE 1.53* 09/01/2014    Last foot exam in 4/16 showed variable monofilament sensation in the toes and plantar surfaces, some areas have decreased sensation especially plantar surfaces.  Has no skin lesions or ulcers on the feet and normal pedal pulses      Examination:   BP 142/70 mmHg  Pulse 77  Temp(Src) 98 F (36.7 C) (Oral)  Resp 18  Ht '5\' 9"'$  (1.753 m)  Wt 245 lb (111.131 kg)  BMI 36.16 kg/m2  SpO2 97%  Body mass index is 36.16 kg/(m^2).    ASSESSMENT/ PLAN:  Diabetes type 2 with poor control See history of present illness for detailed discussion of his current management, blood sugar patterns and problems identified  His blood sugars are somewhat better but still mostly high when he checks them Occasionally will have good readings before his first meal of the day which is usually at mid day His last A1c was still over 7% His main problem is not getting enough coverage for his meals with Novolog especially his evening meals and this was discussed Also has variable compliance with Victoza because of cost and this generally helps his control and weight  Have recommended improve compliance with his supper time insulin to be taken before eating especially when eating out and also increasing the dose to 16 units Discussed timing of glucose  monitoring also including more readings 2 hours after meals   Patient Instructions  LEVEMIR (GREEN) SAME DOSES  Orange pen 10-12 before Bfst and lunch AND 16 units before dinner   Check blood sugars on waking up ..3  .. times a week Also check blood sugars about 2 hours after a meal and do this after different meals by rotation  Recommended blood sugar levels on waking up is 90-130 and about 2 hours after meal is 140-180 Please bring blood sugar monitor to each visit.     Jeananne Bedwell 09/11/2014, 9:13 PM     No visits with results within 1 Week(s) from this visit. Latest known visit with results is:  Lab on 09/01/2014  Component Date Value Ref Range Status  . Sodium 09/01/2014 134* 135 - 145 mEq/L Final  . Potassium 09/01/2014 3.9  3.5 - 5.1 mEq/L Final  . Chloride 09/01/2014 99  96 - 112 mEq/L Final  . CO2 09/01/2014 29  19 - 32 mEq/L Final  . Glucose, Bld 09/01/2014 137* 70 - 99 mg/dL Final  . BUN 09/01/2014 18  6 - 23 mg/dL Final  . Creatinine, Ser 09/01/2014 1.53* 0.40 - 1.50 mg/dL Final  . Calcium 09/01/2014 9.4  8.4 - 10.5 mg/dL Final  . GFR 09/01/2014 47.05* >60.00 mL/min Final  . Pro B Natriuretic peptide (BNP) 09/01/2014 706.0* 0.0 - 100.0 pg/mL Final

## 2014-09-11 NOTE — Patient Instructions (Signed)
LEVEMIR (GREEN) SAME DOSES  Orange pen 10-12 before Bfst and lunch AND 16 units before dinner   Check blood sugars on waking up ..3  .. times a week Also check blood sugars about 2 hours after a meal and do this after different meals by rotation  Recommended blood sugar levels on waking up is 90-130 and about 2 hours after meal is 140-180 Please bring blood sugar monitor to each visit.

## 2014-10-13 ENCOUNTER — Encounter: Payer: Self-pay | Admitting: Internal Medicine

## 2014-11-13 ENCOUNTER — Other Ambulatory Visit: Payer: PPO

## 2014-11-13 ENCOUNTER — Other Ambulatory Visit: Payer: Self-pay | Admitting: Family Medicine

## 2014-11-13 NOTE — Telephone Encounter (Signed)
Medication phoned to pharmacy.  

## 2014-11-13 NOTE — Telephone Encounter (Signed)
Electronic refill request. Last Filled:    30 tablet 2 RF on 06/01/2014  Please advise.

## 2014-11-13 NOTE — Telephone Encounter (Signed)
Please call in.  Thanks.   

## 2014-11-16 ENCOUNTER — Encounter: Payer: Self-pay | Admitting: Endocrinology

## 2014-11-16 ENCOUNTER — Ambulatory Visit (INDEPENDENT_AMBULATORY_CARE_PROVIDER_SITE_OTHER): Payer: PPO | Admitting: Endocrinology

## 2014-11-16 VITALS — BP 126/56 | HR 69 | Temp 98.2°F | Resp 16 | Ht 69.0 in | Wt 245.2 lb

## 2014-11-16 DIAGNOSIS — E1165 Type 2 diabetes mellitus with hyperglycemia: Secondary | ICD-10-CM | POA: Diagnosis not present

## 2014-11-16 DIAGNOSIS — E785 Hyperlipidemia, unspecified: Secondary | ICD-10-CM

## 2014-11-16 DIAGNOSIS — IMO0002 Reserved for concepts with insufficient information to code with codable children: Secondary | ICD-10-CM

## 2014-11-16 NOTE — Progress Notes (Signed)
Patient ID: [name] [name], male   DOB: 04/24/1934, 80 y.o.   MRN: [id number]   Reason for Appointment: Diabetes follow-up   History of Present Illness   Diagnosis: Type 2 DIABETES MELITUS, date of diagnosis: 2002   He has been on insulin since about 2008 with variable control. In 04/2012 his A1c was 10.1% His diabetes control was significantly better with adding Victoza in early 2014 and adjusting his dose of Levemir Also was able to take lower doses of Levemir than before  Recent history:  Insulin regimen: Levemir 15 units twice daily and NovoLog 10-12  units before meals    He still has difficulty controlling his diabetes because of inconsistent compliance Usually despite giving him instructions for meal time insulin doses he would take arbitrary doses  He was advised to take Levemir twice a day instead of once a day which he is doing now  Problems identified and current that sugar patterns:  He is still checking his blood sugars mostly mid day before his first meal and bedtime and not doing postprandial readings  Despite instructions on increasing his suppertime Novolog coverage he is still taking small amounts and most of his blood sugars at bedtime are significantly high, highest 312 with eating ice cream  He has a couple of readings earlier in the morning and these are slightly better but not clear why some days his blood sugars are 150-170; he thinks he is taking his Levemir before his first meal rather than on waking up  He thinks he is taking Victoza more regularly on this visit but generally this has been intermittent and he will take it only when he can afford it  He says he has difficulty losing weight and is usually trying to avoid high-fat foods and drinks with sugar  Oral hypoglycemic drugs: None         Side effects from medications: None Proper timing of medications in relation to meals:  only if eating at home .         Monitors blood glucose: Once a day  or less .    Glucometer: One Touch   Meals: 2-3 meals per day.eating breakfast at 11 am with either oatmeal/egg or apple/peanut butter; lunch 3 PM dinner 7 pm             Blood Glucose readings from meter download as follows  Mean values apply above for all meters except median for One Touch  PRE-MEAL Fasting Lunch Dinner Bedtime Overall  Glucose range:  101-175    145-312   Mean/median:  150     220   155    Hypoglycemia frequency:  none recently.               Physical activity: exercise: Walking about 15 min on some days            Dietician visit: Most recent: 8250           Complications: are: ? Nephropathy     Wt Readings from Last 3 Encounters:  11/16/14 245 lb 3.2 oz (111.222 kg)  09/11/14 245 lb (111.131 kg)  09/07/14 245 lb (111.131 kg)   Lab Results  Component Value Date   HGBA1C 7.6* 07/31/2014   HGBA1C 8.5* 11/24/2013   HGBA1C 7.8* 08/25/2013   Lab Results  Component Value Date   MICROALBUR 9.6* 01/25/2013   LDLCALC 193* 11/16/2014   CREATININE 1.53* 09/01/2014     OTHER problems: Reviewed in ROS especially  recent problem of balance difficulty and falls    Medication List       This list is accurate as of: 11/16/14 11:59 PM.  Always use your most recent med list.               aspirin 81 MG tablet  Take 81 mg by mouth every other day.     carvedilol 25 MG tablet  Commonly known as:  COREG  TAKE 1 AND 1/2 TABLETS BY MOUTH TWICE A DAY     diazepam 10 MG tablet  Commonly known as:  VALIUM  TAKE 1/2 TO 1 TABLET BY MOUTH AT BEDTIMEAS NEEDED FOR ANXIETY OR RINGING IN EARS     doxycycline 100 MG tablet  Commonly known as:  VIBRA-TABS  Take 100 mg by mouth 2 (two) times daily.     glucose blood test strip  Commonly known as:  ONETOUCH VERIO  Use as instructed to check blood sugar 3 times per day dx code E11.29     insulin detemir 100 UNIT/ML injection  Commonly known as:  LEVEMIR  Inject 15 Units into the skin 2 (two) times daily. Add 1 unit  every day until AM sugar is less than 150     Liraglutide 18 MG/3ML Sopn  Commonly known as:  VICTOZA  Inject 1.2 mg into the skin daily.     lisinopril 20 MG tablet  Commonly known as:  PRINIVIL,ZESTRIL  Take 1 tablet (20 mg total) by mouth daily.     nitroGLYCERIN 0.4 MG SL tablet  Commonly known as:  NITROSTAT  Place 1 tablet (0.4 mg total) under the tongue every 5 (five) minutes as needed. For chest pain     NOVOLOG FLEXPEN 100 UNIT/ML FlexPen  Generic drug:  insulin aspart  USE AS DIRECTED PER SLIDING SCALE. SUGAR100-150=10UNITS, 150-200=12UNITS, 200-250=14UNITS, 250+=16UNITS. USE 3 TIMES DAILY     ONETOUCH DELICA LANCETS FINE Misc  Use to check blood sugar 3 times per day dx code 250.42     rosuvastatin 40 MG tablet  Commonly known as:  CRESTOR  Take 1 tablet (40 mg total) by mouth daily.     torsemide 20 MG tablet  Commonly known as:  DEMADEX  Take 2 tablets (40 mg total) by mouth daily.        Allergies:  Allergies  Allergen Reactions  . Sulfa Antibiotics Swelling and Rash    Hands swell    Past Medical History  Diagnosis Date  . COPD (chronic obstructive pulmonary disease) 1993  . Hyperlipidemia 1993  . Hypertension 1993  . CAD (coronary artery disease)     bypass graft surgery 05/2006; grafts were widely patent on cath 06/2010   . Ischemic cardiomyopathy     EF 25-30%; s/p single chamber ICD 2010; upgrade to Medtronic BiV ICD 01/30/12  . Systolic heart failure     class II  . Foot fracture 1953; 1962    left; left  . Chronic kidney disease   . Myocardial infarction 2008; 01/06/2012  . Syncope and collapse 01/05/2012  . Shortness of breath 01/05/2012    "all the time; been going on for awhile"  . Diabetes mellitus, type 2 2000  . Arthritis     "hands" (01/06/2012)  . Elbow mass     left; "just noticed it ~ 2 wk ago"  (01/06/2012)  . Anxiety   . Nightmares     with psych eval 2012 at [facility]  . PTSD (post-traumatic stress disorder)   .  Tinnitus of left  ear   . 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 Boil     "fluid boil left arm" (01/30/2012)  . LBBB (left bundle branch block)     Past Surgical History  Procedure Laterality Date  . Coronary artery bypass graft  2008    3 vessel, class 4 CHF, non Q wave MI 05/19/06  . Appendectomy  1957  . Cardiac defibrillator placement  01/30/2012    biventricular ICD  . Fracture surgery  1953; ~ 1962    left; left  . Cardiac defibrillator placement  01/30/2012    biventricular ICD  . Insert / replace / remove pacemaker  01/30/2012    biventricular ICD placed  . Bi-ventricular implantable cardioverter defibrillator upgrade N/A 01/30/2012    Procedure: BI-VENTRICULAR IMPLANTABLE CARDIOVERTER DEFIBRILLATOR UPGRADE;  Surgeon: Evans Lance, MD;  Location: [facility] CATH LAB;  Service: Cardiovascular;  Laterality: N/A;    Family History  Problem Relation Age of Onset  . Heart disease Father     MI age 17  . Prostate cancer Father   . Diabetes Sister   . Hyperlipidemia Sister   . Cancer Maternal Grandmother     liver, cirrhosis  . Depression Neg Hx   . Alcohol abuse Neg Hx   . Drug abuse Neg Hx   . Stroke Neg Hx   . Colon cancer Neg Hx     Social History:  reports that he has quit smoking. His smoking use included Cigarettes. He has a 17.5 pack-year smoking history. He has never used smokeless tobacco. He reports that he drinks about 4.2 oz of alcohol per week. He reports that he does not use illicit drugs.  Review of Systems:  Hypertension treated with Coreg and lisinopril low dose This is followed by PCP and cardiologist  HYPERLIPIDEMIA:   Has been managed by PCP and cardiologist  Does not think he has had side effects from Lipitor previously, has difficulty affording Crestor  Lab Results  Component Value Date   CHOL 260* 11/16/2014   HDL 31.70* 11/16/2014   LDLCALC 193* 11/16/2014   LDLDIRECT 94.0 07/31/2014   TRIG 175.0* 11/16/2014   CHOLHDL 8 11/16/2014   CKD: His creatinine is usually  mildly high, followed by nephrologist and no new recommendations made   Lab Results  Component Value Date   CREATININE 1.53* 09/01/2014    Last foot exam in 4/16 showed variable monofilament sensation in the toes and plantar surfaces, some areas have decreased sensation especially plantar surfaces.  Has no skin lesions or ulcers on the feet and normal pedal pulses      Examination:   BP 126/56 mmHg  Pulse 69  Temp(Src) 98.2 F (36.8 C)  Resp 16  Ht '5\' 9"'$  (1.753 m)  Wt 245 lb 3.2 oz (111.222 kg)  BMI 36.19 kg/m2  SpO2 96%  Body mass index is 36.19 kg/(m^2).    ASSESSMENT/ PLAN:   Diabetes type 2 with poor control See history of present illness for detailed discussion of his current management, blood sugar patterns and problems identified  His blood sugars are still not well controlled especially postprandial readings in the evenings A1c needs to be done to evaluate his overall control Again discussed that his blood sugars are going up from inadequate Novolog before his evening meal and he needs to take at least 15 units before his evening meal unless eating a light meal He can reduce his Novolog at breakfast especially if he is eating small  amounts as discussed Also recommended that he take the Novolog a few minutes before eating especially in the evening rather than after eating For now since blood sugars are mostly fairly good in the morning will not change his Levemir but encouraged him to check more readings before supper also. Discussed timing of glucose monitoring also including more readings 2 hours after meals Needs to take Victoza daily  Most likely Toujeo will not be covered adequately by his insurance and will stay with Levemir for now  Since he has difficulty with understanding basic principles of insulin will have him see the diabetes nurse educator also  Lipids to be checked today  Patient Instructions  NOVOLOG 15 UNITS ALSO AT [name] [name] may take 12 u if  eating small meal or few carbs  Novolog (Orange) 8 units at breakfast  Check blood sugars on waking up .Marland Kitchen3-4  .Marland Kitchen times a week Also check blood sugars about 2 hours after a meal and do this after different meals by rotation Recommended blood sugar levels on waking up is 90-130 and about 2 hours after meal is 140-180 Please bring blood sugar monitor to each visit.   Counseling time on subjects discussed above is over 50% of today's 25 minute visit   [name] [name] 11/20/2014, 12:28 PM    Addendum: LDL is 193.  Need to check if he needs another prescription for Crestor, if cost is a factor may at least take Lipitor for now

## 2014-11-16 NOTE — Patient Instructions (Addendum)
NOVOLOG 15 UNITS ALSO AT [facility] may take 12 u if eating small meal or few carbs  Novolog (Orange) 8 units at breakfast  Check blood sugars on waking up .Marland Kitchen3-4  .Marland Kitchen times a week Also check blood sugars about 2 hours after a meal and do this after different meals by rotation Recommended blood sugar levels on waking up is 90-130 and about 2 hours after meal is 140-180 Please bring blood sugar monitor to each visit.

## 2014-11-17 LAB — LIPID PANEL
CHOL/HDL RATIO: 8
Cholesterol: 260 mg/dL — ABNORMAL HIGH (ref 0–200)
HDL: 31.7 mg/dL — ABNORMAL LOW (ref >39.00)
LDL Cholesterol: 193 mg/dL — ABNORMAL HIGH (ref 0–99)
NONHDL: 228.22
TRIGLYCERIDES: 175 mg/dL — AB (ref 0.0–149.0)
VLDL: 35 mg/dL (ref 0.0–40.0)

## 2014-12-01 ENCOUNTER — Encounter: Payer: Self-pay | Admitting: *Deleted

## 2014-12-15 LAB — HM DIABETES EYE EXAM

## 2014-12-18 ENCOUNTER — Encounter: Payer: Self-pay | Admitting: *Deleted

## 2014-12-19 ENCOUNTER — Other Ambulatory Visit: Payer: Self-pay | Admitting: Family Medicine

## 2014-12-19 ENCOUNTER — Encounter: Payer: PPO | Attending: Endocrinology | Admitting: Nutrition

## 2014-12-19 DIAGNOSIS — Z713 Dietary counseling and surveillance: Secondary | ICD-10-CM | POA: Insufficient documentation

## 2014-12-19 DIAGNOSIS — E1165 Type 2 diabetes mellitus with hyperglycemia: Secondary | ICD-10-CM | POA: Insufficient documentation

## 2014-12-19 DIAGNOSIS — Z794 Long term (current) use of insulin: Secondary | ICD-10-CM | POA: Insufficient documentation

## 2014-12-19 DIAGNOSIS — E1169 Type 2 diabetes mellitus with other specified complication: Secondary | ICD-10-CM | POA: Diagnosis not present

## 2014-12-19 DIAGNOSIS — IMO0002 Reserved for concepts with insufficient information to code with codable children: Secondary | ICD-10-CM

## 2014-12-20 NOTE — Progress Notes (Signed)
[name] [name] is here to review his blood sugars and insulin doses.  He did not bring his meter.  Says his blood sugars are variable--120-200 ususally. " Sometimes higher if eating more" Discussed the need/importance of taking  his levemir twice daily, and the importance of taking his Novolog before all meals eaten. He is usually eating his first meal around noon.  At that time, he takes 10u of Novolog if his blood sugars are good, 12 units if blood sugar is "high"   He eats again at around 6Pm and will take 10-12u depending on the readings. Discussed the need to take more Novolog (1-2u more) when eating larger meals at breakfast, lunch or supper.  And the need to take some Novolog when snacking late in the evening after supper.  He says this rarely happens, but decided he could take 2u if snacking late while watching tv.    Suggested that when his meals have more carbs like pasta, rice or breads, he add 2 extra units of Novolog before eating it, in stead of waiting until it goes high, and then correcting it.  He said he understood this, and will try this.  Reminded him of the need to bring his meter to each visit with he comes.

## 2015-01-01 ENCOUNTER — Other Ambulatory Visit (HOSPITAL_COMMUNITY): Payer: Self-pay | Admitting: *Deleted

## 2015-01-01 DIAGNOSIS — I5022 Chronic systolic (congestive) heart failure: Secondary | ICD-10-CM

## 2015-01-01 MED ORDER — TORSEMIDE 20 MG PO TABS: 40.0000 mg | ORAL_TABLET | Freq: Every day | ORAL | Status: DC

## 2015-01-03 ENCOUNTER — Encounter: Payer: Self-pay | Admitting: *Deleted

## 2015-01-10 NOTE — Patient Instructions (Signed)
Bring meter to each visit. Add 2 extra units of Novolog before the meal when eating a larger meal to prevent it from going high after the meal. Test blood sugars before all meals and at bedtime.

## 2015-02-13 ENCOUNTER — Other Ambulatory Visit: Payer: Self-pay | Admitting: *Deleted

## 2015-02-13 NOTE — Telephone Encounter (Signed)
Faxed refill request. Last Filled:    30 tablet 2 11/13/2014  Please advise.

## 2015-02-14 MED ORDER — DIAZEPAM 10 MG PO TABS: ORAL_TABLET | ORAL | Status: DC

## 2015-02-14 NOTE — Telephone Encounter (Signed)
Rx called to pharmacy as instructed. 

## 2015-02-14 NOTE — Telephone Encounter (Signed)
Please call in.  Thanks.   

## 2015-02-16 ENCOUNTER — Encounter: Payer: Self-pay | Admitting: Endocrinology

## 2015-02-16 ENCOUNTER — Ambulatory Visit (INDEPENDENT_AMBULATORY_CARE_PROVIDER_SITE_OTHER): Payer: PPO | Admitting: Endocrinology

## 2015-02-16 VITALS — BP 140/82 | HR 72 | Temp 98.6°F | Resp 16 | Ht 69.0 in | Wt 255.6 lb

## 2015-02-16 DIAGNOSIS — E785 Hyperlipidemia, unspecified: Secondary | ICD-10-CM | POA: Diagnosis not present

## 2015-02-16 DIAGNOSIS — Z794 Long term (current) use of insulin: Secondary | ICD-10-CM

## 2015-02-16 DIAGNOSIS — E1121 Type 2 diabetes mellitus with diabetic nephropathy: Secondary | ICD-10-CM | POA: Diagnosis not present

## 2015-02-16 DIAGNOSIS — E1165 Type 2 diabetes mellitus with hyperglycemia: Secondary | ICD-10-CM | POA: Diagnosis not present

## 2015-02-16 LAB — POCT GLYCOSYLATED HEMOGLOBIN (HGB A1C): Hemoglobin A1C: 7.9

## 2015-02-16 NOTE — Patient Instructions (Signed)
Check blood sugars on waking up 3-4  times a week Also check blood sugars about 2 hours after a meal and do this after different meals by rotation especially dinner  Recommended blood sugar levels on waking up is 90-130 and about 2 hours after meal is 130-160  Please bring your blood sugar monitor to each visit, thank you  Exercise upper body  NOVOLOG 18-20 UNITS before dinner depending on the meal/dessert  Reduce on portions and beer

## 2015-02-16 NOTE — Progress Notes (Signed)
Patient ID: [name] [name], male   DOB: 1934/05/02, 80 y.o.   MRN: [id number]   Reason for Appointment: Diabetes follow-up   History of Present Illness   Diagnosis: Type 2 DIABETES MELITUS, date of diagnosis: 2002   He has been on insulin since about 2008 with variable control. In 04/2012 his A1c was 10.1% His diabetes control was significantly better with adding Victoza in early 2014 and adjusting his dose of Levemir Also was able to take lower doses of Levemir than before  Recent history:  Insulin regimen: Levemir 15 units twice daily and NovoLog 15  units before meals    He still has difficulty controlling his diabetes an A1c is again nearly 8% Usually despite giving him instructions for meal time insulin doses he does not always take the dose instructed   He has some benefit from taking Victoza but is not able to lose weight  Problems identified and current that sugar patterns:  He is still checking his blood sugars mostly mid day before his first meal and recently not checking readings after evening meal   His fasting blood sugars appear to be fairly good but not clear which readings recently are before his first meal    All of his readings after supper close to bedtime are significantly high, this is despite his taking 15 units of Novolog instead of 12 ; he does not know how to explain his high reading of 420 except with eating ice cream   he does not appear to be concerned about his high readings    His blood sugars in the afternoons are variable, occasionally still high   Most of his readings late in the evening are high    no hypoglycemia  He thinks he is taking Victoza more regularly on this visit but generally this has been intermittent and he will take it only when he can afford it  Oral hypoglycemic drugs: None         Side effects from medications: None Proper timing of medications in relation to meals:  only if eating at home .         Monitors blood  glucose: Once a day or less .    Glucometer: One Touch   Meals: 2-3 meals per day.eating breakfast at 11 am with either oatmeal/egg or apple/peanut butter; lunch 2-3 PM dinner 7 pm             Blood Glucose readings from meter download as follows  Mean values apply above for all meters except median for One Touch  PRE-MEAL Fasting  midday Dinner Bedtime Overall  Glucose range:  108- 221  135-257   195- 420   Mean/median:    252 181 +/-73    Hypoglycemia frequency:  none recently.               Physical activity: exercise:  Going to the gym only about once a week. He says his legs hurt on walking           Dietician visit: Most recent: 8527           Complications: are: ? Nephropathy     Wt Readings from Last 3 Encounters:  02/16/15 255 lb 9.6 oz (115.939 kg)  11/16/14 245 lb 3.2 oz (111.222 kg)  09/11/14 245 lb (111.131 kg)   Lab Results  Component Value Date   HGBA1C 7.9 02/16/2015   HGBA1C 7.6* 07/31/2014   HGBA1C 8.5* 11/24/2013   Lab Results  Component Value Date   MICROALBUR 9.6* 01/25/2013   LDLCALC 193* 11/16/2014   CREATININE 1.53* 09/01/2014     OTHER problems: Reviewed in ROS especially recent problem of balance difficulty and falls    Medication List       This list is accurate as of: 02/16/15 11:59 PM.  Always use your most recent med list.               aspirin 81 MG tablet  Take 81 mg by mouth every other day.     carvedilol 25 MG tablet  Commonly known as:  COREG  TAKE 1 AND 1/2 TABLETS BY MOUTH TWICE A DAY     diazepam 10 MG tablet  Commonly known as:  VALIUM  TAKE 1/2 TO 1 TABLET BY MOUTH AT BEDTIMEAS NEEDED FOR ANXIETY OR RINGING IN EARS     doxycycline 100 MG tablet  Commonly known as:  VIBRA-TABS  Take 100 mg by mouth 2 (two) times daily.     glucose blood test strip  Commonly known as:  ONETOUCH VERIO  Use as instructed to check blood sugar 3 times per day dx code E11.29     insulin detemir 100 UNIT/ML injection  Commonly known  as:  LEVEMIR  Inject 15 Units into the skin 2 (two) times daily. Add 1 unit every day until AM sugar is less than 150     Liraglutide 18 MG/3ML Sopn  Commonly known as:  VICTOZA  Inject 1.2 mg into the skin daily.     lisinopril 20 MG tablet  Commonly known as:  PRINIVIL,ZESTRIL  Take 1 tablet (20 mg total) by mouth daily.     nitroGLYCERIN 0.4 MG SL tablet  Commonly known as:  NITROSTAT  Place 1 tablet (0.4 mg total) under the tongue every 5 (five) minutes as needed. For chest pain     NOVOLOG FLEXPEN 100 UNIT/ML FlexPen  Generic drug:  insulin aspart  USE AS DIRECTED PER SLIDING SCALE. SUGAR100-150=10UNITS, 150-200=12UNITS, 200-250=14UNITS, 250+=16UNITS. USE 3 TIMES DAILY     ONETOUCH DELICA LANCETS FINE Misc  Use to check blood sugar 3 times per day dx code 250.42     rosuvastatin 40 MG tablet  Commonly known as:  CRESTOR  Take 1 tablet (40 mg total) by mouth daily.     torsemide 20 MG tablet  Commonly known as:  DEMADEX  Take 2 tablets (40 mg total) by mouth daily.        Allergies:  Allergies  Allergen Reactions  . Sulfa Antibiotics Swelling and Rash    Hands swell    Past Medical History  Diagnosis Date  . COPD (chronic obstructive pulmonary disease) ([place]) 1993  . Hyperlipidemia 1993  . Hypertension 1993  . CAD (coronary artery disease)     bypass graft surgery 05/2006; grafts were widely patent on cath 06/2010   . Ischemic cardiomyopathy     EF 25-30%; s/p single chamber ICD 2010; upgrade to Medtronic BiV ICD 01/30/12  . Systolic heart failure     class II  . Foot fracture 1953; 1962    left; left  . Chronic kidney disease   . Myocardial infarction [facility]) 2008; 01/06/2012  . Syncope and collapse 01/05/2012  . Shortness of breath 01/05/2012    "all the time; been going on for awhile"  . Diabetes mellitus, type 2 ([place]) 2000  . Arthritis     "hands" (01/06/2012)  . Elbow mass     left; "just noticed it ~  2 wk ago"  (01/06/2012)  . Anxiety   . Nightmares      with psych eval 2012 at [facility]  . PTSD (post-traumatic stress disorder)   . Tinnitus of left ear   . 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 Boil     "fluid boil left arm" (01/30/2012)  . LBBB (left bundle branch block)     Past Surgical History  Procedure Laterality Date  . Coronary artery bypass graft  2008    3 vessel, class 4 CHF, non Q wave MI 05/19/06  . Appendectomy  1957  . Cardiac defibrillator placement  01/30/2012    biventricular ICD  . Fracture surgery  1953; ~ 1962    left; left  . Cardiac defibrillator placement  01/30/2012    biventricular ICD  . Insert / replace / remove pacemaker  01/30/2012    biventricular ICD placed  . Bi-ventricular implantable cardioverter defibrillator upgrade N/A 01/30/2012    Procedure: BI-VENTRICULAR IMPLANTABLE CARDIOVERTER DEFIBRILLATOR UPGRADE;  Surgeon: Evans Lance, MD;  Location: [facility] CATH LAB;  Service: Cardiovascular;  Laterality: N/A;    Family History  Problem Relation Age of Onset  . Heart disease Father     MI age 31  . Prostate cancer Father   . Diabetes Sister   . Hyperlipidemia Sister   . Cancer Maternal Grandmother     liver, cirrhosis  . Depression Neg Hx   . Alcohol abuse Neg Hx   . Drug abuse Neg Hx   . Stroke Neg Hx   . Colon cancer Neg Hx     Social History:  reports that he has quit smoking. His smoking use included Cigarettes. He has a 17.5 pack-year smoking history. He has never used smokeless tobacco. He reports that he drinks about 4.2 oz of alcohol per week. He reports that he does not use illicit drugs.  Review of Systems:  Hypertension treated with Coreg and lisinopril low dose This is followed by PCP and cardiologist  HYPERLIPIDEMIA:   Has been managed by PCP and cardiologist  He has variable control especially with not being able to afford Crestor consistently   Lab Results  Component Value Date   CHOL 260* 11/16/2014   HDL 31.70* 11/16/2014   LDLCALC 193* 11/16/2014   LDLDIRECT 94.0 07/31/2014    TRIG 175.0* 11/16/2014   CHOLHDL 8 11/16/2014   CKD: His creatinine is  persistently, followed by nephrologist but no records available and noticed and labs available   Lab Results  Component Value Date   CREATININE 1.53* 09/01/2014    Last foot exam in 4/16 showed variable monofilament sensation in the toes and plantar surfaces, some areas have decreased sensation especially plantar surfaces.  Has no skin lesions or ulcers on the feet and normal pedal pulses      Examination:   BP 140/82 mmHg  Pulse 72  Temp(Src) 98.6 F (37 C)  Resp 16  Ht '5\' 9"'$  (1.753 m)  Wt 255 lb 9.6 oz (115.939 kg)  BMI 37.73 kg/m2  SpO2 98%  Body mass index is 37.73 kg/(m^2).    ASSESSMENT/ PLAN:   Diabetes type 2 with poor control See history of present illness for detailed discussion of his current management, blood sugar patterns and problems identified  His blood sugars are still not well controlled especially postprandial readings in the evenings  He is probably eating large portions and some sweets as well as low carbohydrate at suppertime requiring larger doses of insulin  Also  not clear if his morning Levemir  Needs to be increased as he does not check his blood sugars before dinner   He has gained weight and needs to do better on his diet and exercise regimen  If he is not able to walk he can go to the gym and use rowing machine or recumbent bike   Also discussed reducing alcohol intake in the evenings   For now we'll increase his Novolog to at least 18 units at suppertime . No change in other doses as yet Discussed timing of glucose monitoring also including more readings 2 hours after meals instead of  Only midday and bedtime Needs to take Victoza daily  Most likely Toujeo will not be covered adequately by his insurance and will stay with Levemir for now   Patient Instructions  Check blood sugars on waking up 3-4  times a week Also check blood sugars about 2 hours after a meal and do  this after different meals by rotation especially dinner  Recommended blood sugar levels on waking up is 90-130 and about 2 hours after meal is 130-160  Please bring your blood sugar monitor to each visit, thank you  Exercise upper body  NOVOLOG 18-20 UNITS before dinner depending on the meal/dessert  Reduce on portions and beer      Counseling time on subjects discussed above is over 50% of today's 25 minute visit   Breslin Burklow 02/18/2015, 9:31 PM    Addendum: LDL is 193.  Need to check if he needs another prescription for Crestor, if cost is a factor may at least take Lipitor for now

## 2015-02-20 ENCOUNTER — Other Ambulatory Visit (INDEPENDENT_AMBULATORY_CARE_PROVIDER_SITE_OTHER): Payer: PPO

## 2015-02-20 ENCOUNTER — Other Ambulatory Visit: Payer: Self-pay | Admitting: *Deleted

## 2015-02-20 DIAGNOSIS — E785 Hyperlipidemia, unspecified: Secondary | ICD-10-CM

## 2015-02-20 DIAGNOSIS — E1129 Type 2 diabetes mellitus with other diabetic kidney complication: Secondary | ICD-10-CM

## 2015-02-20 DIAGNOSIS — Z794 Long term (current) use of insulin: Principal | ICD-10-CM

## 2015-02-20 DIAGNOSIS — R809 Proteinuria, unspecified: Principal | ICD-10-CM

## 2015-02-20 LAB — LIPID PANEL
Cholesterol: 241 mg/dL — ABNORMAL HIGH (ref 0–200)
HDL: 34 mg/dL — AB (ref >39.00)
LDL Cholesterol: 180 mg/dL — ABNORMAL HIGH (ref 0–99)
NonHDL: 206.9
TRIGLYCERIDES: 135 mg/dL (ref 0.0–149.0)
Total CHOL/HDL Ratio: 7
VLDL: 27 mg/dL (ref 0.0–40.0)

## 2015-02-20 LAB — BASIC METABOLIC PANEL
BUN: 25 mg/dL — ABNORMAL HIGH (ref 6–23)
CALCIUM: 9.5 mg/dL (ref 8.4–10.5)
CO2: 28 mEq/L (ref 19–32)
Chloride: 103 mEq/L (ref 96–112)
Creatinine, Ser: 1.65 mg/dL — ABNORMAL HIGH (ref 0.40–1.50)
GFR: 43.07 mL/min — AB (ref >60.00)
Glucose, Bld: 110 mg/dL — ABNORMAL HIGH (ref 70–99)
Potassium: 4.4 mEq/L (ref 3.5–5.1)
SODIUM: 137 meq/L (ref 135–145)

## 2015-02-20 LAB — HEPATIC FUNCTION PANEL
ALK PHOS: 53 U/L (ref 39–117)
ALT: 13 U/L (ref 0–53)
AST: 15 U/L (ref 0–37)
Albumin: 3.6 g/dL (ref 3.5–5.2)
BILIRUBIN DIRECT: 0.1 mg/dL (ref 0.0–0.3)
Total Bilirubin: 0.4 mg/dL (ref 0.2–1.2)
Total Protein: 6.9 g/dL (ref 6.0–8.3)

## 2015-02-20 LAB — MICROALBUMIN / CREATININE URINE RATIO
CREATININE, U: 130.5 mg/dL
MICROALB/CREAT RATIO: 48.4 mg/g — AB (ref 0.0–30.0)
Microalb, Ur: 63.2 mg/dL — ABNORMAL HIGH (ref 0.0–1.9)

## 2015-02-27 ENCOUNTER — Other Ambulatory Visit: Payer: Self-pay | Admitting: *Deleted

## 2015-02-27 DIAGNOSIS — E7800 Pure hypercholesterolemia, unspecified: Secondary | ICD-10-CM

## 2015-02-27 MED ORDER — ROSUVASTATIN CALCIUM 40 MG PO TABS: 40.0000 mg | ORAL_TABLET | Freq: Every day | ORAL | Status: DC

## 2015-02-27 MED ORDER — GLUCOSE BLOOD VI STRP: ORAL_STRIP | Status: DC

## 2015-02-27 NOTE — Progress Notes (Signed)
Quick Note:  Please let patient know that the cholesterol is very high and he needs to restart taking his Crestor 40 mg, this is now a Generic ______

## 2015-02-28 ENCOUNTER — Telehealth: Payer: Self-pay | Admitting: *Deleted

## 2015-02-28 NOTE — Telephone Encounter (Signed)
Received faxed refill request from pharmacy for Levemir Flextouch 100U/ml, inject 28-30 units into the skin at bedtime, add 1 unit every day until morning sugar is less than 150 This does not match medication list Please advise See request on your desk

## 2015-02-28 NOTE — Telephone Encounter (Signed)
I will ask for endo input on this, routed, app endo help.

## 2015-03-05 ENCOUNTER — Telehealth: Payer: Self-pay | Admitting: Cardiology

## 2015-03-05 NOTE — Telephone Encounter (Signed)
Called [facility]MT[facility]. 

## 2015-03-05 NOTE — Telephone Encounter (Signed)
Per wife who is here with a friend.  She states patient is very sob, fatigue and dizzy x 2 weeks.  It has gotten worse this past week.  Patient is now using a walker to get around the house to to SOB.    She is very concern that he might have fluid overload.  He is also having bilateral swelling in feet.

## 2015-03-07 ENCOUNTER — Telehealth: Payer: Self-pay | Admitting: Internal Medicine

## 2015-03-07 ENCOUNTER — Other Ambulatory Visit: Payer: Self-pay | Admitting: Endocrinology

## 2015-03-07 NOTE — Telephone Encounter (Signed)
New message  Pt received a certified letter in the mail stating that he has not received an follow up device check in sometime. Pt has appt scheduled for 04/11/2015 with Dr. Caryl Comes. Pt is worried. Requests a call back to discuss.

## 2015-03-07 NOTE — Telephone Encounter (Signed)
Please send Levemir prescription using the current regimen in my note

## 2015-03-07 NOTE — Telephone Encounter (Signed)
Explained to pt family member that the reason the letter was received b/c he has not had his ICD interrogated since 08/2014 and he is suppose to have it interrogated every 3 months. I told pt family member that pt can come into office now to have ICD interrogated by device clinic. And she agreed w/ this an appt on 03-13-15 at 8:30 AM.

## 2015-03-08 NOTE — Telephone Encounter (Signed)
Please close encounter if completed. Thanks.

## 2015-03-09 ENCOUNTER — Encounter (INDEPENDENT_AMBULATORY_CARE_PROVIDER_SITE_OTHER): Payer: Self-pay

## 2015-03-09 ENCOUNTER — Encounter: Payer: Self-pay | Admitting: Family Medicine

## 2015-03-09 ENCOUNTER — Ambulatory Visit (INDEPENDENT_AMBULATORY_CARE_PROVIDER_SITE_OTHER): Payer: PPO | Admitting: Family Medicine

## 2015-03-09 VITALS — BP 118/62 | HR 69 | Temp 97.5°F | Wt 250.2 lb

## 2015-03-09 DIAGNOSIS — R413 Other amnesia: Secondary | ICD-10-CM | POA: Diagnosis not present

## 2015-03-09 DIAGNOSIS — D649 Anemia, unspecified: Secondary | ICD-10-CM | POA: Diagnosis not present

## 2015-03-09 LAB — COMPREHENSIVE METABOLIC PANEL
ALK PHOS: 50 U/L (ref 40–115)
ALT: 9 U/L (ref 9–46)
AST: 11 U/L (ref 10–35)
Albumin: 3.8 g/dL (ref 3.6–5.1)
BILIRUBIN TOTAL: 0.4 mg/dL (ref 0.2–1.2)
BUN: 27 mg/dL — ABNORMAL HIGH (ref 7–25)
CALCIUM: 8.8 mg/dL (ref 8.6–10.3)
CO2: 27 mmol/L (ref 20–31)
Chloride: 103 mmol/L (ref 98–110)
Creat: 1.75 mg/dL — ABNORMAL HIGH (ref 0.70–1.18)
Glucose, Bld: 116 mg/dL — ABNORMAL HIGH (ref 65–99)
POTASSIUM: 4.1 mmol/L (ref 3.5–5.3)
SODIUM: 140 mmol/L (ref 135–146)
TOTAL PROTEIN: 6.6 g/dL (ref 6.1–8.1)

## 2015-03-09 LAB — CBC WITH DIFFERENTIAL/PLATELET
BASOS ABS: 0 10*3/uL (ref 0.0–0.1)
BASOS PCT: 0 % (ref 0–1)
EOS ABS: 0.1 10*3/uL (ref 0.0–0.7)
Eosinophils Relative: 2 % (ref 0–5)
HCT: 31.9 % — ABNORMAL LOW (ref 39.0–52.0)
Hemoglobin: 10.2 g/dL — ABNORMAL LOW (ref 13.0–17.0)
Lymphocytes Relative: 22 % (ref 12–46)
Lymphs Abs: 1.4 10*3/uL (ref 0.7–4.0)
MCH: 28.1 pg (ref 26.0–34.0)
MCHC: 32 g/dL (ref 30.0–36.0)
MCV: 87.9 fL (ref 78.0–100.0)
MPV: 9.5 fL (ref 8.6–12.4)
Monocytes Absolute: 0.8 10*3/uL (ref 0.1–1.0)
Monocytes Relative: 13 % — ABNORMAL HIGH (ref 3–12)
NEUTROS ABS: 4 10*3/uL (ref 1.7–7.7)
NEUTROS PCT: 63 % (ref 43–77)
PLATELETS: 284 10*3/uL (ref 150–400)
RBC: 3.63 MIL/uL — AB (ref 4.22–5.81)
RDW: 14.5 % (ref 11.5–15.5)
WBC: 6.4 10*3/uL (ref 4.0–10.5)

## 2015-03-09 NOTE — Patient Instructions (Addendum)
Find out how much of what insulin you have been taking.   Call me at that point.   Go to the lab on the way out.  We'll contact you with your lab report. Rosaria Ferries will call about your referral. We'll be in touch when I see the CT and the labs.  Take care.

## 2015-03-09 NOTE — Progress Notes (Signed)
Pre visit review using our clinic review tool, if applicable. No additional management support is needed unless otherwise documented below in the visit note.  He is unclear re: insulin dosing and use.  May have been taking levemir TID.  He can't explain with certainty re: his dosing.   Others/wife have noted trouble with concentration, not sleeping well.  Taking diazepam to help sleep w/o as much benefit as prev.  Dreams are worse recently.  Worried about drowning in the shower.   He has been getting up in the middle of the night.  He is fatigued.   He has had more trouble getting in and out of bed due to aches.   He does worse at night and afternoon.   Overall all sx worse in the last 3 weeks.    He is stoic and won't give a lot of details at the OV o/w.  Some hx per wife, with his consent.   PMH and SH reviewed  Meds, vitals, and allergies reviewed.   ROS: See HPI.  Otherwise, noncontributory.  GEN: nad, alert and oriented HEENT: mucous membranes moist NECK: supple w/o LA CV: rrr. PULM: ctab, no inc wob ABD: soft, +bs EXT: no edema SKIN: no acute rash  Memory  3/3 orientation.   3/3 attention Math testing wnl Can read a watch face 2/3 recall with prompting.

## 2015-03-10 LAB — VITAMIN B12: Vitamin B-12: 510 pg/mL (ref 211–911)

## 2015-03-10 LAB — TSH: TSH: 4.093 u[IU]/mL (ref 0.350–4.500)

## 2015-03-12 ENCOUNTER — Encounter: Payer: Self-pay | Admitting: Family Medicine

## 2015-03-12 DIAGNOSIS — R413 Other amnesia: Secondary | ICD-10-CM | POA: Insufficient documentation

## 2015-03-12 NOTE — Assessment & Plan Note (Signed)
His memory is worse today- he can't explain his insulin dosing.  His BZD use for sleep isn't different.  Unclear if there is a new issue going on (ie CVA, dementia) vs problem from BZD (but not at a higher dose) vs confusion from med list (extensive list).  At this point, corrected med list given to patient and wife.   They'll update me re: insulin use.  See notes on labs.  Will check head CT.  At this point, okay for outpatient f/u with supervision of wife, present at the [place].   >25 minutes spent in face to face time with patient, >50% spent in counselling or coordination of care.

## 2015-03-13 ENCOUNTER — Ambulatory Visit (INDEPENDENT_AMBULATORY_CARE_PROVIDER_SITE_OTHER): Payer: PPO | Admitting: *Deleted

## 2015-03-13 ENCOUNTER — Encounter: Payer: Self-pay | Admitting: Internal Medicine

## 2015-03-13 DIAGNOSIS — Z95810 Presence of automatic (implantable) cardiac defibrillator: Secondary | ICD-10-CM

## 2015-03-13 DIAGNOSIS — I5022 Chronic systolic (congestive) heart failure: Secondary | ICD-10-CM | POA: Diagnosis not present

## 2015-03-13 LAB — CUP PACEART INCLINIC DEVICE CHECK
Brady Statistic AP VS Percent: 0.01 %
Brady Statistic AS VP Percent: 99.15 %
Brady Statistic RA Percent Paced: 0.8 %
Date Time Interrogation Session: 20161206150806
HIGH POWER IMPEDANCE MEASURED VALUE: 39 Ohm
HIGH POWER IMPEDANCE MEASURED VALUE: 49 Ohm
HighPow Impedance: 399 Ohm
Implantable Lead Implant Date: 20091229
Implantable Lead Implant Date: 20131025
Implantable Lead Location: 753858
Implantable Lead Location: 753860
Implantable Lead Model: 6947
Lead Channel Impedance Value: 380 Ohm
Lead Channel Impedance Value: 399 Ohm
Lead Channel Impedance Value: 494 Ohm
Lead Channel Impedance Value: 665 Ohm
Lead Channel Pacing Threshold Amplitude: 0.75 V
Lead Channel Pacing Threshold Pulse Width: 0.4 ms
Lead Channel Sensing Intrinsic Amplitude: 29.375 mV
Lead Channel Sensing Intrinsic Amplitude: 3.125 mV
Lead Channel Setting Pacing Amplitude: 1.75 V
Lead Channel Setting Pacing Pulse Width: 0.4 ms
MDC IDC LEAD IMPLANT DT: 20131025
MDC IDC LEAD LOCATION: 753859
MDC IDC LEAD MODEL: 4196
MDC IDC MSMT BATTERY VOLTAGE: 3.01 V
MDC IDC MSMT LEADCHNL LV IMPEDANCE VALUE: 399 Ohm
MDC IDC MSMT LEADCHNL RA PACING THRESHOLD AMPLITUDE: 1 V
MDC IDC MSMT LEADCHNL RA PACING THRESHOLD PULSEWIDTH: 0.4 ms
MDC IDC MSMT LEADCHNL RV PACING THRESHOLD AMPLITUDE: 1 V
MDC IDC MSMT LEADCHNL RV PACING THRESHOLD PULSEWIDTH: 0.4 ms
MDC IDC SET LEADCHNL LV PACING PULSEWIDTH: 0.4 ms
MDC IDC SET LEADCHNL RA PACING AMPLITUDE: 2 V
MDC IDC SET LEADCHNL RV PACING AMPLITUDE: 2.5 V
MDC IDC SET LEADCHNL RV SENSING SENSITIVITY: 0.3 mV
MDC IDC STAT BRADY AP VP PERCENT: 0.79 %
MDC IDC STAT BRADY AS VS PERCENT: 0.05 %
MDC IDC STAT BRADY RV PERCENT PACED: 99.94 %

## 2015-03-13 NOTE — Progress Notes (Signed)
CRT-D device check in office. Thresholds and sensing consistent with previous device measurements. Lead impedance trends stable over time. No mode switch episodes recorded. 5 NSVT episodes--longest ~40 beats (initially falling under detection rate in VT zone), patient does not recall any symptoms, avg V rates for all episodes from 171-198bpm. 7 Vs episodes--markers suggest ? slow NSVT, peak V 158bpm. Patient bi-ventricularly pacing 99.3% of the time. Device programmed with appropriate safety margins. Heart failure diagnostics reviewed and trends are stable for patient. No changes made this session. Battery voltage 3.01V, RRT at 2.36V.  Patient enrolled in remote follow up. Patient education completed including shock plan. ROV with SK on 04/11/15 at 2:15pm.

## 2015-03-14 ENCOUNTER — Other Ambulatory Visit: Payer: Self-pay | Admitting: Family Medicine

## 2015-03-14 ENCOUNTER — Telehealth: Payer: Self-pay

## 2015-03-14 ENCOUNTER — Other Ambulatory Visit (INDEPENDENT_AMBULATORY_CARE_PROVIDER_SITE_OTHER): Payer: PPO

## 2015-03-14 DIAGNOSIS — D649 Anemia, unspecified: Secondary | ICD-10-CM

## 2015-03-14 DIAGNOSIS — D509 Iron deficiency anemia, unspecified: Secondary | ICD-10-CM

## 2015-03-14 LAB — CBC WITH DIFFERENTIAL/PLATELET
BASOS ABS: 0 10*3/uL (ref 0.0–0.1)
Basophils Relative: 0.5 % (ref 0.0–3.0)
EOS PCT: 2.3 % (ref 0.0–5.0)
Eosinophils Absolute: 0.2 10*3/uL (ref 0.0–0.7)
HCT: 32.5 % — ABNORMAL LOW (ref 39.0–52.0)
HEMOGLOBIN: 10.5 g/dL — AB (ref 13.0–17.0)
LYMPHS ABS: 1.3 10*3/uL (ref 0.7–4.0)
LYMPHS PCT: 15.3 % (ref 12.0–46.0)
MCHC: 32.4 g/dL (ref 30.0–36.0)
MCV: 87.9 fl (ref 78.0–100.0)
MONOS PCT: 7.3 % (ref 3.0–12.0)
Monocytes Absolute: 0.6 10*3/uL (ref 0.1–1.0)
NEUTROS PCT: 74.6 % (ref 43.0–77.0)
Neutro Abs: 6.2 10*3/uL (ref 1.4–7.7)
Platelets: 283 10*3/uL (ref 150.0–400.0)
RBC: 3.69 Mil/uL — AB (ref 4.22–5.81)
RDW: 14.9 % (ref 11.5–15.5)
WBC: 8.4 10*3/uL (ref 4.0–10.5)

## 2015-03-14 LAB — IBC PANEL
Iron: 27 ug/dL — ABNORMAL LOW (ref 42–165)
SATURATION RATIOS: 9.2 % — AB (ref 20.0–50.0)
Transferrin: 209 mg/dL — ABNORMAL LOW (ref 212.0–360.0)

## 2015-03-14 NOTE — Telephone Encounter (Signed)
Pt's wife Manuela Schwartz left note (DPR signed) under pt instructions for levemir inj to add 1 unit every day until AM sugar is less than 150; pt mistook that add 1 unit to mean an extra shot. Manuela Schwartz advised pt that did not mean take an extra shot but meant add only one unit of insulin. Since 03/09/15 pt has not done this again. Wanted to know if Dr Dwyane Dee should be notified; left detailed v/m per DPR that Dr Damita Dunnings was not in office today until 2 PM and advised that Manuela Schwartz should let Dr Ronnie Derby office know. FYI to Dr Damita Dunnings. Copy of med list Manuela Schwartz brought to office in Dr Josefine Class in box.

## 2015-03-14 NOTE — Telephone Encounter (Signed)
Discussed with Dr. Damita Dunnings, okay to close message since Dr. Dwyane Dee is taking care of patient's insulin.

## 2015-03-14 NOTE — Telephone Encounter (Signed)
See result note.  My questions are: How many shots of insulin is he taking in a day? How many shots are levemir? How many shots are novolog? What is the dose with each shot?  We can get endo involved, but in the meantime I still need the info above.  Thanks.  I routed this to Dr. Dwyane Dee as a FYI.  I'm not changing his insulin at this point, I"m only trying to find out what he has been doing.

## 2015-03-14 NOTE — Telephone Encounter (Signed)
Rhonda: Please call his wife Manuela Schwartz with insulin instructions:  Levemir 15 units twice daily and NovoLog 15 units before breakfast and lunch and 20 units before dinner  He will only adjust the Novolog insulin at suppertime based on his meal size. No change in Levemir on a daily basis

## 2015-03-14 NOTE — Telephone Encounter (Signed)
Noted, patients wife is aware. 

## 2015-03-16 ENCOUNTER — Ambulatory Visit: Payer: PPO

## 2015-03-19 NOTE — Telephone Encounter (Signed)
Will you check and see if this is completed and if so have encounter closed.

## 2015-03-19 NOTE — Telephone Encounter (Signed)
Done

## 2015-03-21 ENCOUNTER — Ambulatory Visit
Admission: RE | Admit: 2015-03-21 | Discharge: 2015-03-21 | Disposition: A | Discharge status: Home / Self Care | Payer: PPO | Source: Ambulatory Visit | Attending: Family Medicine | Admitting: Family Medicine

## 2015-03-21 DIAGNOSIS — I739 Peripheral vascular disease, unspecified: Secondary | ICD-10-CM | POA: Insufficient documentation

## 2015-03-21 DIAGNOSIS — R413 Other amnesia: Secondary | ICD-10-CM

## 2015-03-27 ENCOUNTER — Ambulatory Visit: Payer: PPO | Admitting: Family Medicine

## 2015-03-28 ENCOUNTER — Ambulatory Visit (INDEPENDENT_AMBULATORY_CARE_PROVIDER_SITE_OTHER): Payer: PPO | Admitting: Family Medicine

## 2015-03-30 ENCOUNTER — Other Ambulatory Visit (INDEPENDENT_AMBULATORY_CARE_PROVIDER_SITE_OTHER): Payer: PPO

## 2015-03-30 DIAGNOSIS — D649 Anemia, unspecified: Secondary | ICD-10-CM

## 2015-03-30 LAB — FECAL OCCULT BLOOD, IMMUNOCHEMICAL: Fecal Occult Bld: NEGATIVE

## 2015-04-03 ENCOUNTER — Encounter: Payer: Self-pay | Admitting: *Deleted

## 2015-04-04 ENCOUNTER — Ambulatory Visit (INDEPENDENT_AMBULATORY_CARE_PROVIDER_SITE_OTHER): Payer: PPO | Admitting: Family Medicine

## 2015-04-04 ENCOUNTER — Ambulatory Visit (INDEPENDENT_AMBULATORY_CARE_PROVIDER_SITE_OTHER)
Admission: RE | Admit: 2015-04-04 | Discharge: 2015-04-04 | Disposition: A | Discharge status: Home / Self Care | Payer: PPO | Source: Ambulatory Visit | Attending: Family Medicine | Admitting: Family Medicine

## 2015-04-04 ENCOUNTER — Encounter: Payer: Self-pay | Admitting: Family Medicine

## 2015-04-04 VITALS — BP 152/62 | HR 80 | Temp 97.4°F | Wt 254.0 lb

## 2015-04-04 DIAGNOSIS — R0602 Shortness of breath: Secondary | ICD-10-CM

## 2015-04-04 DIAGNOSIS — R413 Other amnesia: Secondary | ICD-10-CM

## 2015-04-04 DIAGNOSIS — D509 Iron deficiency anemia, unspecified: Secondary | ICD-10-CM

## 2015-04-04 MED ORDER — INSULIN DETEMIR 100 UNIT/ML FLEXPEN: PEN_INJECTOR | SUBCUTANEOUS | Status: DC

## 2015-04-04 NOTE — Patient Instructions (Signed)
[name] [name] will call about your referral. Go to the lab on the way out.  We'll contact you with your lab report. Take an extra demadex tomorrow.   Don't change your insulin.

## 2015-04-04 NOTE — Progress Notes (Signed)
Pre visit review using our clinic review tool, if applicable. No additional management support is needed unless otherwise documented below in the visit note.  Recheck pulse ox 94%.   Patient is stoic and doesn't give a lot of details on initial questioning.  He can be tangential in answering questions, reverting back to discussions of growing up in [place] during [place] (this isn't new, he has done this with prior conversations).  I tried to redirect the conversation back to current events, ie acute/recent medical conditions.  Hx per patient and wife, who don't always agree fully on details/severity of sx.  To the best of my ability, the following is correct.    Prev with some episodic SOB at night with occ cough.  Has been sleeping propped up in a recliner recently.  Some BLE edema.  Weight is up recently, d/w pt.  He doesn't admit to more salt recently.  Now with rare etoh intake per patient report.    Memory changes prev noted with labs prev reviewed, d/w pt.  CT w/o specific changes, did have some volume loss.  See below re: MMSE.    DM2- he has been back on the insulin regimen listed in the EMR with AM sugars ~100 before meals.    Anemia.  Repeat CBC with persistent low HGB noted.  Low iron level noted.  IFOB neg, but concern for slow GI loss.  No frank blood, no black stools per patient.  We talked about GI referral and consents to the referral for eval.    PMH and SH reviewed  ROS: See HPI, otherwise noncontributory.  Meds, vitals, and allergies reviewed.   MMSE 27/30 (-2 for recall, -1 or orientation- he didn't know the day of the month).   nad ncat Stoic demeanor, at baseline Mmm Neck supple, no LA Rrr, oct ectopy noted No focal dec in BS, no wheeze, no rales, no inc in wob abd soft Ext with 1+ BLE edema.  Skin still well perfused, normal cap bed refill.

## 2015-04-05 ENCOUNTER — Other Ambulatory Visit: Payer: Self-pay | Admitting: Family Medicine

## 2015-04-05 DIAGNOSIS — R7989 Other specified abnormal findings of blood chemistry: Secondary | ICD-10-CM

## 2015-04-05 DIAGNOSIS — D509 Iron deficiency anemia, unspecified: Secondary | ICD-10-CM | POA: Insufficient documentation

## 2015-04-05 LAB — BASIC METABOLIC PANEL
BUN: 30 mg/dL — AB (ref 6–23)
CHLORIDE: 103 meq/L (ref 96–112)
CO2: 26 mEq/L (ref 19–32)
Calcium: 9.5 mg/dL (ref 8.4–10.5)
Creatinine, Ser: 1.9 mg/dL — ABNORMAL HIGH (ref 0.40–1.50)
GFR: 36.59 mL/min — AB (ref >60.00)
Glucose, Bld: 123 mg/dL — ABNORMAL HIGH (ref 70–99)
POTASSIUM: 4.5 meq/L (ref 3.5–5.1)
SODIUM: 138 meq/L (ref 135–145)

## 2015-04-05 LAB — CBC WITH DIFFERENTIAL/PLATELET
BASOS ABS: 0 10*3/uL (ref 0.0–0.1)
Basophils Relative: 0.3 % (ref 0.0–3.0)
Eosinophils Absolute: 0.2 10*3/uL (ref 0.0–0.7)
Eosinophils Relative: 3.4 % (ref 0.0–5.0)
HEMATOCRIT: 33.7 % — AB (ref 39.0–52.0)
HEMOGLOBIN: 10.9 g/dL — AB (ref 13.0–17.0)
LYMPHS PCT: 20 % (ref 12.0–46.0)
Lymphs Abs: 1.4 10*3/uL (ref 0.7–4.0)
MCHC: 32.3 g/dL (ref 30.0–36.0)
MCV: 86.8 fl (ref 78.0–100.0)
MONOS PCT: 5.7 % (ref 3.0–12.0)
Monocytes Absolute: 0.4 10*3/uL (ref 0.1–1.0)
NEUTROS ABS: 5.1 10*3/uL (ref 1.4–7.7)
Neutrophils Relative %: 70.6 % (ref 43.0–77.0)
Platelets: 270 10*3/uL (ref 150.0–400.0)
RBC: 3.89 Mil/uL — AB (ref 4.22–5.81)
RDW: 14.3 % (ref 11.5–15.5)
WBC: 7.2 10*3/uL (ref 4.0–10.5)

## 2015-04-05 LAB — BRAIN NATRIURETIC PEPTIDE: Pro B Natriuretic peptide (BNP): 371 pg/mL — ABNORMAL HIGH (ref 0.0–100.0)

## 2015-04-05 MED ORDER — LEVOFLOXACIN 500 MG PO TABS: 500.0000 mg | ORAL_TABLET | Freq: Every day | ORAL | Status: DC

## 2015-04-05 MED ORDER — FERROUS SULFATE 325 (65 FE) MG PO TABS: 325.0000 mg | ORAL_TABLET | Freq: Every day | ORAL | Status: DC

## 2015-04-05 NOTE — Assessment & Plan Note (Signed)
This may still be an issue, though MMSE was nearly normal today.  The other issues are taking precedence at this point.  It is possible that his mood may be lower from other chronic illnesses with pseudodementia a possibility; we are going to try to address his other chronic medical issues and we can follow along about his mood.  His mood is "low" per his report, but he has no SI.  Still okay for outpatient f/u.  Given possible memory loss, I advised against driving for now and for him to have some family help.  I signed off on a letter to allow family members to ask their employer for workhour adjustment as needed for his care.

## 2015-04-05 NOTE — Assessment & Plan Note (Signed)
Repeat CBC pending.  Refer to GI.  Prev CBC with persistent low HGB noted. Low iron level prev noted. IFOB neg, but concern for slow GI loss. No frank blood, no black stools per patient. All d/w pt.  >40 minutes spent in face to face time with patient, >50% spent in counselling or coordination of care

## 2015-04-05 NOTE — Assessment & Plan Note (Signed)
This could have mult causes, ie CHF, PNA, etc.   See notes on CXR and labs.  He is still okay for outpatient f/u at this point.  I do want him to take an extra demadex tomorrow.  I am uncertain if he has been salt loading, as he isn't forthcoming with details about diet.

## 2015-04-06 ENCOUNTER — Ambulatory Visit: Payer: PPO | Admitting: Family Medicine

## 2015-04-06 ENCOUNTER — Telehealth: Payer: Self-pay

## 2015-04-06 NOTE — Telephone Encounter (Signed)
Mrs Pienta left v/m; pt was seen 04/04/15;pt started abx on 04/05/15 and can tell improvement after one pill of abx. FYI to Dr Damita Dunnings.

## 2015-04-06 NOTE — Telephone Encounter (Signed)
Noted. Thanks.

## 2015-04-09 NOTE — Telephone Encounter (Signed)
Noted. Thanks.

## 2015-04-11 ENCOUNTER — Telehealth: Payer: Self-pay

## 2015-04-11 ENCOUNTER — Ambulatory Visit (INDEPENDENT_AMBULATORY_CARE_PROVIDER_SITE_OTHER): Payer: PPO | Admitting: Internal Medicine

## 2015-04-11 ENCOUNTER — Encounter: Payer: Self-pay | Admitting: Internal Medicine

## 2015-04-11 VITALS — BP 130/60 | HR 67 | Ht 68.0 in | Wt 251.8 lb

## 2015-04-11 DIAGNOSIS — I255 Ischemic cardiomyopathy: Secondary | ICD-10-CM | POA: Diagnosis not present

## 2015-04-11 DIAGNOSIS — Z95810 Presence of automatic (implantable) cardiac defibrillator: Secondary | ICD-10-CM

## 2015-04-11 MED ORDER — PEN NEEDLES 31G X 5 MM MISC: 15.0000 [IU] | Freq: Two times a day (BID) | Status: AC

## 2015-04-11 MED ORDER — METOLAZONE 2.5 MG PO TABS
ORAL_TABLET | ORAL | Status: DC
Start: 2015-04-11 — End: 2015-10-05

## 2015-04-11 NOTE — Progress Notes (Signed)
Patient Care Team: Tonia Ghent, MD as PCP - General (Family Medicine) Larey Dresser, MD as Referring Physician (Cardiology) Lavonia Dana, MD as Consulting Physician (Internal Medicine)   HPI  [name] [name] is a 81 y.o. male Seen in followup for CRT-D.  Initial implant 2009 with CRT upgrade 2013 (GT)  implanted for ischemic heart myopathy and nonsustained ventricular tachycardia he has a history of left bundle branch block   . Catheterization 3/12 demonstrated patent grafts. Ejection fraction 20-25%.there is interval improvement in his ejection fraction to 35-40% 10/14.  Is obstructive sleep apnea treated with CPAP. He has not been seen regularly and has not been undertaking device follow-up regularly.  Over recent months he has had more problems with shortness of breath and peripheral edema. His salt intake is not scant. He has had more problems with orthopnea and more recently nocturnal dyspnea and is currently sitting up in a chair. He saw his PCP a week ago. A chest x-ray was described as "consistent with pneumonia". I looked at the film and that the report and also more of an effusion me. Notably, his white count was also normal. His BNP was elevated creatinine is also elevated at 19.    Past Medical History  Diagnosis Date  . COPD (chronic obstructive pulmonary disease) ([place]) 1993  . Hyperlipidemia 1993  . Hypertension 1993  . CAD (coronary artery disease)     bypass graft surgery 05/2006; grafts were widely patent on cath 06/2010   . Ischemic cardiomyopathy     EF 25-30%; s/p single chamber ICD 2010; upgrade to Medtronic BiV ICD 01/30/12  . Systolic heart failure     class II  . Foot fracture 1953; 1962    left; left  . Chronic kidney disease   . Myocardial infarction [facility]) 2008; 01/06/2012  . Syncope and collapse 01/05/2012  . Shortness of breath 01/05/2012    "all the time; been going on for awhile"  . Diabetes mellitus, type 2 ([place]) 2000  . Arthritis    "hands" (01/06/2012)  . Elbow mass     left; "just noticed it ~ 2 wk ago"  (01/06/2012)  . Anxiety   . Nightmares     with psych eval 2012 at [facility]  . PTSD (post-traumatic stress disorder)   . Tinnitus of left ear   . 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 Boil     "fluid boil left arm" (01/30/2012)  . LBBB (left bundle branch block)     Past Surgical History  Procedure Laterality Date  . Coronary artery bypass graft  2008    3 vessel, class 4 CHF, non Q wave MI 05/19/06  . Appendectomy  1957  . Cardiac defibrillator placement  01/30/2012    biventricular ICD  . Fracture surgery  1953; ~ 1962    left; left  . Cardiac defibrillator placement  01/30/2012    biventricular ICD  . Insert / replace / remove pacemaker  01/30/2012    biventricular ICD placed  . Bi-ventricular implantable cardioverter defibrillator upgrade N/A 01/30/2012    Procedure: BI-VENTRICULAR IMPLANTABLE CARDIOVERTER DEFIBRILLATOR UPGRADE;  Surgeon: Evans Lance, MD;  Location: [facility] CATH LAB;  Service: Cardiovascular;  Laterality: N/A;    Current Outpatient Prescriptions  Medication Sig Dispense Refill  . aspirin 81 MG tablet Take 81 mg by mouth every other day.     . carvedilol (COREG) 25 MG tablet TAKE 1 AND 1/2 TABLETS BY MOUTH TWICE A DAY 90  tablet 3  . diazepam (VALIUM) 10 MG tablet TAKE 1/2 TO 1 TABLET BY MOUTH AT BEDTIMEAS NEEDED FOR ANXIETY OR RINGING IN EARS 30 tablet 2  . ferrous sulfate ([employer] IRON) 325 (65 FE) MG tablet Take 1 tablet (325 mg total) by mouth daily with breakfast. 90 tablet 3  . glucose blood (ONETOUCH VERIO) test strip Use as instructed to check blood sugar 3 times per day dx code E11.29 100 each 3  . Insulin Detemir (LEVEMIR FLEXTOUCH) 100 UNIT/ML Pen INJECT 15 UNITS INTO THE SKIN TWICE A DAY.    Marland Kitchen Insulin Pen Needle (PEN NEEDLES) 31G X 5 MM MISC Inject 15 Units as directed 2 (two) times daily. Use pen needles for pt insulin pen. Dx E11.29 100 each 5  . levofloxacin (LEVAQUIN) 500 MG tablet Take 1  tablet (500 mg total) by mouth daily. 7 tablet 0  . lisinopril (PRINIVIL,ZESTRIL) 20 MG tablet Take 1 tablet (20 mg total) by mouth daily.    . nitroGLYCERIN (NITROSTAT) 0.4 MG SL tablet Place 1 tablet (0.4 mg total) under the tongue every 5 (five) minutes as needed. For chest pain 25 tablet 11  . NOVOLOG FLEXPEN 100 UNIT/ML FlexPen USE AS DIRECTED PER SLIDING SCALE. SUGAR100-150=10UNITS, 150-200=12UNITS, 200-250=14UNITS, 250+=16UNITS. USE 3 TIMES DAILY (Patient taking differently: No sig reported) 30 mL 3  . ONETOUCH DELICA LANCETS FINE MISC Use to check blood sugar 3 times per day dx code 250.42 100 each 3  . torsemide (DEMADEX) 20 MG tablet Take 2 tablets (40 mg total) by mouth daily. 60 tablet 3   No current facility-administered medications for this visit.    Allergies  Allergen Reactions  . Sulfa Antibiotics Swelling and Rash    Hands swell    Review of Systems negative except from HPI and PMH  Physical Exam BP 130/60 mmHg  Pulse 67  Ht '5\' 8"'$  (1.727 m)  Wt 251 lb 12.8 oz (114.216 kg)  BMI 38.30 kg/m2 Well developed and well nourished in no acute distress HENT normal E scleral and icterus clear Neck Supple JVP 8-10 carotids brisk and full Clear to ausculation Device pocket well healed; without hematoma or erythema.  There is no tethering \*Regular rate and rhythm, 2/6 systolic gallops or rub Soft with active bowel sounds No clubbing cyanosis  2+ Edema Alert and oriented, grossly normal motor and sensory function Skin Warm and Dry  ECG  P-synchronous pacing. The QRS upright in lead 1, is negative the 1, this is unchanged from 2013 post implant.  Assessment and  Plan  Ischemic cardiac myopathy  Without symptoms of ischemia  Congestive heart failure-chronic systolic acute  CRT-D-Medtronic  The patient's device was interrogated.  The information was reviewed. No changes were made in the programming.   Hypertension   Well conrolled   He has evidence of acute on  chronic heart failure. His BNP is elevated his chest x-ray showed pleural effusion. His neck veins are up and he has peripheral edema.  He has responded to increased diuresis to the titrated torsemide. He still is struggling however. I will give him Zaroxolyn 2.5 mg to take tomorrow and then in about 3 days. To follow-up with his PCP tomorrow. We will arrange follow-up with one of our PAs about 3 weeks. He has scheduled follow-up with Dr. DM in about 2 months  There is evidence of AIVR and nonsustained ventricular tachycardia. This is  not likely  contributing to his symptoms.   We have discussed the importance of salt  restriction. It is not clear to me she remembers although he apparently just passed a memory test.

## 2015-04-11 NOTE — Patient Instructions (Addendum)
Medication Instructions: 1) Take Metolazone (Zaroxalyn) 2.5 mg one tablet 30 minutes prior to your torsemide - tomorrow (04/12/15)      Take Metolazone (Zaroxalyn) 2.5 mg one tablet 30 minutes prior to your torsemide - Sunday (04/15/15)  Labwork: - none  Procedures/Testing: - none  Follow-Up: - Your physician recommends that you schedule a follow-up appointment in: 3 weeks with the PA/ NP  - Your physician recommends that you schedule a follow-up appointment in: 3 months with the [place] physician wants you to follow-up in: 1 year with Dr. Caryl Comes. You will receive a reminder letter in the mail two months in advance. If you don't receive a letter, please call our office to schedule the follow-up appointment.  Any Additional Special Instructions Will Be Listed Below (If Applicable).

## 2015-04-11 NOTE — Telephone Encounter (Signed)
[place] Ar with [facility] left v/m requesting refill 26m pen needles; done.

## 2015-04-12 ENCOUNTER — Ambulatory Visit: Payer: PPO | Admitting: Family Medicine

## 2015-04-16 ENCOUNTER — Ambulatory Visit: Payer: PPO | Admitting: Family Medicine

## 2015-04-20 ENCOUNTER — Other Ambulatory Visit: Payer: Self-pay

## 2015-04-20 MED ORDER — INSULIN ASPART 100 UNIT/ML FLEXPEN: PEN_INJECTOR | SUBCUTANEOUS | Status: DC

## 2015-04-20 NOTE — Telephone Encounter (Signed)
Rx request for Novolog Flexpen 100 u/ml- sliding scale.  Pt has an upcoming appt with Dr. Dwyane Dee in Feb 2017.  Rx sent to pharmacy.

## 2015-04-20 NOTE — Addendum Note (Signed)
Addended by: Colleen Can on: 04/20/2015 05:11 PM   Modules accepted: Orders

## 2015-04-23 DIAGNOSIS — M1712 Unilateral primary osteoarthritis, left knee: Secondary | ICD-10-CM | POA: Diagnosis not present

## 2015-04-30 ENCOUNTER — Other Ambulatory Visit: Payer: Self-pay | Admitting: *Deleted

## 2015-04-30 MED ORDER — CARVEDILOL 25 MG PO TABS: ORAL_TABLET | ORAL | Status: DC

## 2015-05-01 LAB — CUP PACEART INCLINIC DEVICE CHECK
Battery Voltage: 3.01 V
Brady Statistic AS VS Percent: 0.28 %
Brady Statistic RA Percent Paced: 0.39 %
Date Time Interrogation Session: 20170104210448
HIGH POWER IMPEDANCE MEASURED VALUE: 42 Ohm
HIGH POWER IMPEDANCE MEASURED VALUE: 55 Ohm
HighPow Impedance: 399 Ohm
Implantable Lead Location: 753859
Implantable Lead Model: 5076
Implantable Lead Model: 6947
Lead Channel Impedance Value: 513 Ohm
Lead Channel Pacing Threshold Amplitude: 0.75 V
Lead Channel Pacing Threshold Pulse Width: 0.4 ms
Lead Channel Pacing Threshold Pulse Width: 0.4 ms
Lead Channel Sensing Intrinsic Amplitude: 21.375 mV
Lead Channel Setting Pacing Amplitude: 1.75 V
Lead Channel Setting Pacing Amplitude: 2 V
Lead Channel Setting Pacing Pulse Width: 0.4 ms
Lead Channel Setting Pacing Pulse Width: 0.4 ms
MDC IDC LEAD IMPLANT DT: 20091229
MDC IDC LEAD IMPLANT DT: 20131025
MDC IDC LEAD IMPLANT DT: 20131025
MDC IDC LEAD LOCATION: 753858
MDC IDC LEAD LOCATION: 753860
MDC IDC LEAD MODEL: 4196
MDC IDC MSMT LEADCHNL LV IMPEDANCE VALUE: 399 Ohm
MDC IDC MSMT LEADCHNL LV IMPEDANCE VALUE: 456 Ohm
MDC IDC MSMT LEADCHNL LV IMPEDANCE VALUE: 722 Ohm
MDC IDC MSMT LEADCHNL RA IMPEDANCE VALUE: 437 Ohm
MDC IDC MSMT LEADCHNL RA PACING THRESHOLD AMPLITUDE: 0.75 V
MDC IDC MSMT LEADCHNL RA PACING THRESHOLD PULSEWIDTH: 0.4 ms
MDC IDC MSMT LEADCHNL RA SENSING INTR AMPL: 2.875 mV
MDC IDC MSMT LEADCHNL RV PACING THRESHOLD AMPLITUDE: 0.75 V
MDC IDC SET LEADCHNL RV PACING AMPLITUDE: 2.5 V
MDC IDC SET LEADCHNL RV SENSING SENSITIVITY: 0.3 mV
MDC IDC STAT BRADY AP VP PERCENT: 0.38 %
MDC IDC STAT BRADY AP VS PERCENT: 0.01 %
MDC IDC STAT BRADY AS VP PERCENT: 99.33 %
MDC IDC STAT BRADY RV PERCENT PACED: 99.71 %

## 2015-05-01 NOTE — Progress Notes (Signed)
Cardiology Office Note:    Date:  05/01/2015   ID:  [name] [name], DOB 10-14-1934, MRN [id number]  PCP:  Elsie Stain, MD  Cardiologist:  Dr. [name]   Electrophysiologist:  Dr. Virl Axe   Chief Complaint  Patient presents with  . Congestive Heart Failure    Follow-up    History of Present Illness:    [name] [name] is a 81 y.o. male with a hx of CAD status post CABG, ischemic cardiomyopathy, systolic CHF, status post CRT-D, LBBB, COPD, CKD, HTN, HL, DM2, OSA on CPAP.  LHC in 2012 demonstrated patent bypass grafts. EF has been as low as 20-25%. Last echo in 10/14 with EF 35-40%. Last seen by Dr. Aundra Dubin 3/15. Previously followed in the heart failure clinic. Last seen there 12/15.    He was recently seen by Dr. Caryl Comes 04/11/15 and noted to be volume overloaded. Recent BNP was elevated and chest x-ray demonstrated pleural effusion. Torsemide dose had been adjusted prior to this appointment. He was given metolazone to augment diuresis 2 doses. He returns for follow-up.  Past Medical History  Diagnosis Date  . COPD (chronic obstructive pulmonary disease) ([place]) 1993  . Hyperlipidemia 1993  . Hypertension 1993  . CAD (coronary artery disease)     bypass graft surgery 05/2006; grafts were widely patent on cath 06/2010   . Ischemic cardiomyopathy     EF 25-30%; s/p single chamber ICD 2010; upgrade to Medtronic BiV ICD 01/30/12  . Systolic heart failure     class II  . Foot fracture 1953; 1962    left; left  . Chronic kidney disease   . Myocardial infarction [facility]) 2008; 01/06/2012  . Syncope and collapse 01/05/2012  . Shortness of breath 01/05/2012    "all the time; been going on for awhile"  . Diabetes mellitus, type 2 ([place]) 2000  . Arthritis     "hands" (01/06/2012)  . Elbow mass     left; "just noticed it ~ 2 wk ago"  (01/06/2012)  . Anxiety   . Nightmares     with psych eval 2012 at [facility]  . PTSD (post-traumatic stress disorder)   . Tinnitus of left ear   . Deafness in  left ear   . Boil     "fluid boil left arm" (01/30/2012)  . LBBB (left bundle branch block)   1. CAD: S/p CABG 2008. Cath 3/12 with totally occluded mLAD and RCA, 80% LCx stenosis, patent LIMA-LAD, patent SVG-OM, 40% stenosis in SVG-PDA.  2. Ischemic cardiomyopathy: Echo (10/13) with severely dilated LV, moderate LVH, EF 20-25%, mild MR. Medtronic CRT-D device 10/13. Echo (10/14) with severely dilated LV, mild LVH, EF 35-40% (improved).  3. LBBB 4. COPD 5. Type II diabetes 6. Hyperlipidemia 7. HTN 8. CKD 9. Obesity 10. OA: knees. 11. PTSD 12. H/o ETOH abuse 13. OSA: Severe by sleep study, on CPAP.  14. PAD: ABIs (5/14) with 0.75 right, 0.80 left. ABIs (08/2013) with 0.81 left and 0.75 right (stable).   Past Surgical History  Procedure Laterality Date  . Coronary artery bypass graft  2008    3 vessel, class 4 CHF, non Q wave MI 05/19/06  . Appendectomy  1957  . Cardiac defibrillator placement  01/30/2012    biventricular ICD  . Fracture surgery  1953; ~ 1962    left; left  . Cardiac defibrillator placement  01/30/2012    biventricular ICD  . Insert / replace / remove pacemaker  01/30/2012    biventricular ICD  placed  . Bi-ventricular implantable cardioverter defibrillator upgrade N/A 01/30/2012    Procedure: BI-VENTRICULAR IMPLANTABLE CARDIOVERTER DEFIBRILLATOR UPGRADE;  Surgeon: Evans Lance, MD;  Location: [facility] CATH LAB;  Service: Cardiovascular;  Laterality: N/A;    Current Medications: Outpatient Prescriptions Prior to Visit  Medication Sig Dispense Refill  . aspirin 81 MG tablet Take 81 mg by mouth every other day.     . carvedilol (COREG) 25 MG tablet TAKE 1 AND 1/2 TABLETS BY MOUTH TWICE A DAY 90 tablet 3  . diazepam (VALIUM) 10 MG tablet TAKE 1/2 TO 1 TABLET BY MOUTH AT BEDTIMEAS NEEDED FOR ANXIETY OR RINGING IN EARS 30 tablet 2  . ferrous sulfate ([employer]S IRON) 325 (65 FE) MG tablet Take 1 tablet (325 mg total) by mouth daily with breakfast. 90 tablet 3  . glucose  blood (ONETOUCH VERIO) test strip Use as instructed to check blood sugar 3 times per day dx code E11.29 100 each 3  . insulin aspart (NOVOLOG FLEXPEN) 100 UNIT/ML FlexPen Use as directed per sliding scale. USE AS DIRECTED PER SLIDING SCALE. SUGAR100-150=10UNITS, 150-200=12UNITS, 200-250=14UNITS, 250+=16UNITS. USE 3 TIMES DAILY 15 mL 3  . insulin aspart (NOVOLOG FLEXPEN) 100 UNIT/ML FlexPen 18 to 20 units at supper 15 mL 3  . Insulin Detemir (LEVEMIR FLEXTOUCH) 100 UNIT/ML Pen INJECT 15 UNITS INTO THE SKIN TWICE A DAY.    Marland Kitchen Insulin Pen Needle (PEN NEEDLES) 31G X 5 MM MISC Inject 15 Units as directed 2 (two) times daily. Use pen needles for pt insulin pen. Dx E11.29 100 each 5  . levofloxacin (LEVAQUIN) 500 MG tablet Take 1 tablet (500 mg total) by mouth daily. 7 tablet 0  . lisinopril (PRINIVIL,ZESTRIL) 20 MG tablet Take 1 tablet (20 mg total) by mouth daily.    . metolazone (ZAROXOLYN) 2.5 MG tablet Take one tablet on 04/12/15 and a one tablet on 04/15/15- take this 30 minutes prior to your torsemide dose 5 tablet 0  . nitroGLYCERIN (NITROSTAT) 0.4 MG SL tablet Place 1 tablet (0.4 mg total) under the tongue every 5 (five) minutes as needed. For chest pain 25 tablet 11  . ONETOUCH DELICA LANCETS FINE MISC Use to check blood sugar 3 times per day dx code 250.42 100 each 3  . torsemide (DEMADEX) 20 MG tablet Take 2 tablets (40 mg total) by mouth daily. 60 tablet 3   No facility-administered medications prior to visit.     Allergies:   Sulfa antibiotics   Social History   Social History  . Marital Status: Married    Spouse Name: N/A  . Number of Children: 0  . Years of Education: N/A   Occupational History  . 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 Smoking status: Former Smoker -- 0.50 packs/day for 35 years    Types: Cigarettes  . Smokeless tobacco: Never Used     Comment: 01/06/2012 "quit smoking in 2008; slip and have one ocasionally still"  . Alcohol Use:  4.2 oz/week    7 Glasses of wine per week     Comment: 01/06/2012 "big bottle of wine at least 3 nights/wk; 6 pack pretty much q night"; 01/30/2012 "maybe a glass of wine q hs since left hospital last"". 06/2013 "a beer or two a week"  . Drug Use: No  . Sexual Activity: No   Other Topics Concern  . Not on file   Social History Narrative   Second marriage.   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   Likes to build Chief Executive Officer, likes fishing.   H/o profound social/family upheaval in WW2     Family History:  The patient's family history includes Cancer in his maternal grandmother; Diabetes in his sister; Heart disease in his father; Hyperlipidemia in his sister; Prostate cancer in his father. There is no history of Depression, Alcohol abuse, Drug abuse, Stroke, or Colon cancer.   ROS:   Please see the history of present illness.    ROS All other systems reviewed and are negative.   Physical Exam:    VS:  There were no vitals taken for this visit.   GEN: Well nourished, well developed, in no acute distress HEENT: normal Neck: no JVD, no masses Cardiac: Normal S1/S2, RRR; no murmurs, rubs, or gallops, no edema;   carotid bruits,   Respiratory:  clear to auscultation bilaterally; no wheezing, rhonchi or rales GI: soft, nontender, nondistended, + BS MS: no deformity or atrophy Skin: warm and dry, no rash Neuro:  Bilateral strength equal, no focal deficits  Psych: Alert and oriented x 3, normal affect  Wt Readings from Last 3 Encounters:  04/11/15 251 lb 12.8 oz (114.216 kg)  04/04/15 254 lb (115.214 kg)  03/09/15 250 lb 4 oz (113.513 kg)      Studies/Labs Reviewed:    EKG:  EKG is  ordered today.  The ekg ordered today demonstrates   Recent Labs: 03/09/2015: ALT 9; TSH 4.093 04/04/2015: BUN 30*; Creatinine, Ser 1.90*; Hemoglobin 10.9*; Platelets 270.0; Potassium 4.5; Pro B Natriuretic peptide (BNP) 371.0*; Sodium 138   Recent Lipid Panel    Component Value Date/Time     CHOL 241* 02/20/2015 1403   TRIG 135.0 02/20/2015 1403   HDL 34.00* 02/20/2015 1403   CHOLHDL 7 02/20/2015 1403   VLDL 27.0 02/20/2015 1403   LDLCALC 180* 02/20/2015 1403   LDLDIRECT 94.0 07/31/2014 1619    Additional studies/ records that were reviewed today include:   Echo 10/14 EF 35-40%, diffuse HK, mild LVH, grade 1 diastolic dysfunction, mildly restricted AV motion, moderate LAE, mild reduced RVSF  LHC 06/2010 LM 60% LAD mid occluded, proximal DX 70% (2 small for PCI) LCx proximal 50-60%, AV groove 80%, OM 50% RCA mid occluded LIMA-LAD patent SVG-OM patent SVG-PDA patent with 40% proximal, mid and distal  Myoview 3/12 IMPRESSION: 1. Moderate reversible defect in the mid and distal anteroseptal wall, consistent with myocardial ischemia. 2. Inferior wall myocardial scar. 3. Hypokinesis of the inferior, septal, and apical walls, with calculated left ventricular ejection fraction of 24%.    ASSESSMENT:    1. Chronic systolic CHF (congestive heart failure) ([place])   2. Ischemic cardiomyopathy   3. Coronary artery disease involving native coronary artery of native heart without angina pectoris   4. Essential hypertension   5. Hyperlipidemia   6. Chronic kidney disease, stage III (moderate)   7. Automatic implantable cardioverter-defibrillator in situ     PLAN:    In order of problems listed above:  1. Chronic systolic CHF -   2. Ischemic cardiomyopathy -   3. CAD -   4. HTN -   5. HLD -  LDL in 11/16 was 180.   6. CKD -  Creatinine on 04/04/15 was 1.9.  7. S/p CRT-D - FU with EP as planned.     Medication Adjustments/Labs and Tests Ordered: Current medicines are reviewed at length with the patient today.  Concerns regarding medicines are outlined above.  Medication changes, Labs and  Tests ordered today are outlined in the Patient Instructions noted below. There are no Patient Instructions on file for this visit.   Signed, Richardson Dopp, PA-C   05/01/2015 5:49 PM    [facility] [address], [place], [place]  [address] Phone: [phone]; Fax: [phone]     This encounter was created in error - please disregard.

## 2015-05-02 ENCOUNTER — Encounter: Payer: PPO | Admitting: Physician Assistant

## 2015-05-09 ENCOUNTER — Encounter: Payer: Self-pay | Admitting: Physician Assistant

## 2015-05-14 ENCOUNTER — Other Ambulatory Visit: Payer: Self-pay | Admitting: Student

## 2015-05-14 DIAGNOSIS — R1310 Dysphagia, unspecified: Secondary | ICD-10-CM | POA: Diagnosis not present

## 2015-05-14 DIAGNOSIS — D509 Iron deficiency anemia, unspecified: Secondary | ICD-10-CM | POA: Diagnosis not present

## 2015-05-16 ENCOUNTER — Ambulatory Visit: Payer: PPO

## 2015-05-18 ENCOUNTER — Ambulatory Visit (INDEPENDENT_AMBULATORY_CARE_PROVIDER_SITE_OTHER): Payer: PPO | Admitting: Endocrinology

## 2015-05-18 ENCOUNTER — Encounter: Payer: Self-pay | Admitting: Endocrinology

## 2015-05-18 VITALS — BP 140/76 | HR 66 | Temp 97.6°F | Resp 18 | Ht 68.0 in | Wt 244.2 lb

## 2015-05-18 DIAGNOSIS — IMO0002 Reserved for concepts with insufficient information to code with codable children: Secondary | ICD-10-CM

## 2015-05-18 DIAGNOSIS — E1129 Type 2 diabetes mellitus with other diabetic kidney complication: Secondary | ICD-10-CM

## 2015-05-18 DIAGNOSIS — Z794 Long term (current) use of insulin: Secondary | ICD-10-CM

## 2015-05-18 DIAGNOSIS — E1165 Type 2 diabetes mellitus with hyperglycemia: Secondary | ICD-10-CM

## 2015-05-18 LAB — POCT GLYCOSYLATED HEMOGLOBIN (HGB A1C): HEMOGLOBIN A1C: 7.6

## 2015-05-18 NOTE — Progress Notes (Signed)
Pre visit review using our clinic review tool, if applicable. No additional management support is needed unless otherwise documented below in the visit note. 

## 2015-05-18 NOTE — Patient Instructions (Signed)
Take 18 Novolog at dinner and with large meal 20 units  MUST check sugars after dinner 2 -3 hours later

## 2015-05-18 NOTE — Progress Notes (Signed)
Patient ID: [name] [name], male   DOB: 04/12/1934, 81 y.o.   MRN: [id number]   Reason for Appointment:  follow-up   History of Present Illness   Diagnosis: Type 2 DIABETES MELITUS, date of diagnosis: 2002   He has been on insulin since about 2008 with variable control. In 04/2012 his A1c was 10.1% His diabetes control was significantly better with adding Victoza in early 2014 and adjusting his dose of Levemir Also was able to take lower doses of Levemir than before  Recent history:   Insulin regimen: Levemir 15 units twice daily and NovoLog 15  units before meals    He still has difficulty controlling his diabetes an A1c is again over 7% Usually despite giving him written instructions for meal time insulin doses especially suppertime he does not increase the dose as discussed on each visit   He has now discontinued taking Victoza because of cost  Problems identified and current that sugar patterns:  He is still checking his blood sugars mostly mid day before his first meal and only occasionally after supper but not recently  His morning blood sugars are fairly good overall with mild fluctuation and occasional low normal readings  POSTPRANDIAL blood sugars have been checked only 2 or 3 times recently in the last month and they are all high   Surprisingly he has lost weight since his last visit it without Victoza  He thinks he is fairly compliant with taking all his insulin doses as directed   no hypoglycemia  Oral hypoglycemic drugs: None         Side effects from medications: None Proper timing of medications in relation to meals:  only if eating at home .         Monitors blood glucose: Once a day or less .    Glucometer: One Touch   Meals: 2-3 meals per day.eating breakfast at 11 am with either oatmeal/egg or apple/peanut butter; lunch 2-3 PM dinner 7 pm             Blood Glucose readings from meter download as follows  Mean values apply above for all meters  except median for One Touch  PRE-MEAL Fasting Lunch Dinner Bedtime Overall  Glucose range:  92-175    156  239, 311   Mean/median:  136     141        Physical activity: exercise:  Minimal.  He says his legs hurt on walking           Dietician visit: Most recent: 2010             Wt Readings from Last 3 Encounters:  05/18/15 244 lb 4 oz (110.791 kg)  04/11/15 251 lb 12.8 oz (114.216 kg)  04/04/15 254 lb (115.214 kg)   Lab Results  Component Value Date   HGBA1C 7.6 05/18/2015   HGBA1C 7.9 02/16/2015   HGBA1C 7.6* 07/31/2014   Lab Results  Component Value Date   MICROALBUR 63.2* 02/20/2015   LDLCALC 180* 02/20/2015   CREATININE 1.90* 04/04/2015     OTHER problems: Reviewed in ROS       Medication List       This list is accurate as of: 05/18/15 11:59 PM.  Always use your most recent med list.               aspirin 81 MG tablet  Take 81 mg by mouth every other day.     carvedilol 25 MG tablet  Commonly known as:  COREG  TAKE 1 AND 1/2 TABLETS BY MOUTH TWICE A DAY     diazepam 10 MG tablet  Commonly known as:  VALIUM  TAKE 1/2 TO 1 TABLET BY MOUTH AT BEDTIMEAS NEEDED FOR ANXIETY OR RINGING IN EARS     ferrous sulfate 325 (65 FE) MG tablet  Commonly known as:  CVS IRON  Take 1 tablet (325 mg total) by mouth daily with breakfast.     glucose blood test strip  Commonly known as:  ONETOUCH VERIO  Use as instructed to check blood sugar 3 times per day dx code E11.29     insulin aspart 100 UNIT/ML FlexPen  Commonly known as:  NOVOLOG FLEXPEN  Use as directed per sliding scale. USE AS DIRECTED PER SLIDING SCALE. SUGAR100-150=10UNITS, 150-200=12UNITS, 200-250=14UNITS, 250+=16UNITS. USE 3 TIMES DAILY     insulin aspart 100 UNIT/ML FlexPen  Commonly known as:  NOVOLOG FLEXPEN  18 to 20 units at supper     Insulin Detemir 100 UNIT/ML Pen  Commonly known as:  LEVEMIR FLEXTOUCH  INJECT 15 UNITS INTO THE SKIN TWICE A DAY.     lisinopril 20 MG tablet  Commonly  known as:  PRINIVIL,ZESTRIL  Take 1 tablet (20 mg total) by mouth daily.     metolazone 2.5 MG tablet  Commonly known as:  ZAROXOLYN  Take one tablet on 04/12/15 and a one tablet on 04/15/15- take this 30 minutes prior to your torsemide dose     nitroGLYCERIN 0.4 MG SL tablet  Commonly known as:  NITROSTAT  Place 1 tablet (0.4 mg total) under the tongue every 5 (five) minutes as needed. For chest pain     ONETOUCH DELICA LANCETS FINE Misc  Use to check blood sugar 3 times per day dx code 250.42     Pen Needles 31G X 5 MM Misc  Inject 15 Units as directed 2 (two) times daily. Use pen needles for pt insulin pen. Dx E11.29     torsemide 20 MG tablet  Commonly known as:  DEMADEX  Take 2 tablets (40 mg total) by mouth daily.        Allergies:  Allergies  Allergen Reactions  . Sulfa Antibiotics Swelling and Rash    Hands swell    Past Medical History  Diagnosis Date  . COPD (chronic obstructive pulmonary disease) ([place]) 1993  . Hyperlipidemia 1993  . Hypertension 1993  . CAD (coronary artery disease)     bypass graft surgery 05/2006; grafts were widely patent on cath 06/2010   . Ischemic cardiomyopathy     EF 25-30%; s/p single chamber ICD 2010; upgrade to Medtronic BiV ICD 01/30/12  . Systolic heart failure     class II  . Foot fracture 1953; 1962    left; left  . Chronic kidney disease   . Myocardial infarction [facility]) 2008; 01/06/2012  . Syncope and collapse 01/05/2012  . Shortness of breath 01/05/2012    "all the time; been going on for awhile"  . Diabetes mellitus, type 2 ([place]) 2000  . Arthritis     "hands" (01/06/2012)  . Elbow mass     left; "just noticed it ~ 2 wk ago"  (01/06/2012)  . Anxiety   . Nightmares     with psych eval 2012 at [facility]  . PTSD (post-traumatic stress disorder)   . Tinnitus of left ear   . 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 Boil     "fluid boil left arm" (01/30/2012)  .  LBBB (left bundle branch block)     Past Surgical History  Procedure Laterality Date   . Coronary artery bypass graft  2008    3 vessel, class 4 CHF, non Q wave MI 05/19/06  . Appendectomy  1957  . Cardiac defibrillator placement  01/30/2012    biventricular ICD  . Fracture surgery  1953; ~ 1962    left; left  . Cardiac defibrillator placement  01/30/2012    biventricular ICD  . Insert / replace / remove pacemaker  01/30/2012    biventricular ICD placed  . Bi-ventricular implantable cardioverter defibrillator upgrade N/A 01/30/2012    Procedure: BI-VENTRICULAR IMPLANTABLE CARDIOVERTER DEFIBRILLATOR UPGRADE;  Surgeon: Evans Lance, MD;  Location: [facility] CATH LAB;  Service: Cardiovascular;  Laterality: N/A;    Family History  Problem Relation Age of Onset  . Heart disease Father     MI age 43  . Prostate cancer Father   . Diabetes Sister   . Hyperlipidemia Sister   . Cancer Maternal Grandmother     liver, cirrhosis  . Depression Neg Hx   . Alcohol abuse Neg Hx   . Drug abuse Neg Hx   . Stroke Neg Hx   . Colon cancer Neg Hx     Social History:  reports that he has quit smoking. His smoking use included Cigarettes. He has a 17.5 pack-year smoking history. He has never used smokeless tobacco. He reports that he drinks about 4.2 oz of alcohol per week. He reports that he does not use illicit drugs.  Review of Systems:  HYPERTENSION treated with Coreg and lisinopril 20 mg with adequate control This is followed by PCP and cardiologist  HYPERLIPIDEMIA:   Has been managed by PCP and cardiologist  He has variable control especially with not being consistent with his medications despite reminders on each visit, was recommended 40 mg Crestor   Lab Results  Component Value Date   CHOL 241* 02/20/2015   HDL 34.00* 02/20/2015   LDLCALC 180* 02/20/2015   LDLDIRECT 94.0 07/31/2014   TRIG 135.0 02/20/2015   CHOLHDL 7 02/20/2015   CKD: His creatinine is generally stable and followed by nephrologist   Lab Results  Component Value Date   CREATININE 1.90* 04/04/2015     Last foot exam in 4/16 showed variable monofilament sensation in the toes and plantar surfaces, some areas have decreased sensation especially plantar surfaces.  Has no skin lesions or ulcers on the feet and normal pedal pulses      Examination:   BP 140/76 mmHg  Pulse 66  Temp(Src) 97.6 F (36.4 C) (Oral)  Resp 18  Ht '5\' 8"'$  (1.727 m)  Wt 244 lb 4 oz (110.791 kg)  BMI 37.15 kg/m2  SpO2 97%  Body mass index is 37.15 kg/(m^2).    ASSESSMENT/ PLAN:   Diabetes type 2 with poor control See history of present illness for detailed discussion of his current management, blood sugar patterns and problems identified  His blood sugars are still not well controlled with blood sugars after evening meal planning to be consistently high However He has difficulty following instructions on his insulin doses, monitoring at various times and adjusting his insulin based on meal size Morning sugars are somewhat variable also Currently not appearing to be having higher readings up to his stopping Victoza because of high cost  Today discussed with him in detail the benefits of an insulin infusion device compared to supplement his injections and showed him how the V-go pump  would work He is agreeable to trying this although is concerned about the cost and will verify his insurance coverage Also explained that he will need to take only Novolog as he only insulin, information brochure given He will start using it with the help of the nurse educator next week and not take his Levemir  that morning He can continue Novolog in the pump  HYPERLIPIDEMIA: Needs follow-up levels, again appears not to be taking his Crestor   Patient Instructions  Take 18 Novolog at dinner and with large meal 20 units  MUST check sugars after dinner 2 -3 hours later     Counseling time on subjects discussed above is over 50% of today's 25 minute visit   Tryston Gilliam 05/20/2015, 6:01 PM

## 2015-05-22 ENCOUNTER — Encounter: Payer: PPO | Attending: Endocrinology | Admitting: Nutrition

## 2015-05-22 DIAGNOSIS — E1101 Type 2 diabetes mellitus with hyperosmolarity with coma: Secondary | ICD-10-CM

## 2015-05-22 DIAGNOSIS — E1165 Type 2 diabetes mellitus with hyperglycemia: Secondary | ICD-10-CM | POA: Diagnosis not present

## 2015-05-22 DIAGNOSIS — Z794 Long term (current) use of insulin: Secondary | ICD-10-CM

## 2015-05-23 ENCOUNTER — Telehealth: Payer: Self-pay | Admitting: Nutrition

## 2015-05-23 ENCOUNTER — Other Ambulatory Visit (HOSPITAL_COMMUNITY): Payer: Self-pay | Admitting: *Deleted

## 2015-05-23 DIAGNOSIS — I5022 Chronic systolic (congestive) heart failure: Secondary | ICD-10-CM

## 2015-05-23 NOTE — Patient Instructions (Signed)
Apply a filled V-Go every morning.  Give 7-8 button presses for breakfast and lunch, and 9-10 button presses for supper Test blood sugars before meals and at bed time.

## 2015-05-23 NOTE — Progress Notes (Signed)
Mr. Yurko and his wife were instructed on how to fill, apply and use the V-go.  He filled his V-go with Novolog and very little assistance from me.  And he redemonstrated how to give the premeal bolus amounts with no assistance.  He reverbalized that each button press with deliver 2 units of Novolog, and that he will take6 button presses before breakfast and lunch, 18 units at supper( 20u if a larger meal).    He applied the V-go 20 to his R abdomen, and inserted the needle with very little help from me.   He was given some extra test strips and told to test ac and HS.  He agreed to do this.  He was told to call if blood sugars drop low or remain high.   He was given a starter kit with directions for filling and applying the v-go, and was told to call them if questions develop.   He re verbalized that he will take no more Levemir.

## 2015-05-23 NOTE — Telephone Encounter (Signed)
Mr. Roper reported no difficulty using the V-go last night.  FBS today was 103, and 134acL.  He denies low blood sugars.  Says he will give 6 button presses for all meals,and see how that works.  He has an appt. With Dr, Dwyane Dee on Friday.    He filled and applied a new V-go this afternoon without an difficulty.  He had no questions for me.

## 2015-05-24 ENCOUNTER — Other Ambulatory Visit (HOSPITAL_COMMUNITY): Payer: Self-pay | Admitting: *Deleted

## 2015-05-24 DIAGNOSIS — I5022 Chronic systolic (congestive) heart failure: Secondary | ICD-10-CM

## 2015-05-24 MED ORDER — TORSEMIDE 20 MG PO TABS: 40.0000 mg | ORAL_TABLET | Freq: Every day | ORAL | Status: DC

## 2015-05-25 ENCOUNTER — Other Ambulatory Visit: Payer: Self-pay | Admitting: *Deleted

## 2015-05-25 ENCOUNTER — Encounter: Payer: Self-pay | Admitting: Endocrinology

## 2015-05-25 ENCOUNTER — Ambulatory Visit (INDEPENDENT_AMBULATORY_CARE_PROVIDER_SITE_OTHER): Payer: PPO | Admitting: Endocrinology

## 2015-05-25 VITALS — BP 138/82 | HR 76 | Temp 98.8°F | Resp 16 | Ht 68.0 in | Wt 247.8 lb

## 2015-05-25 DIAGNOSIS — E1165 Type 2 diabetes mellitus with hyperglycemia: Secondary | ICD-10-CM

## 2015-05-25 DIAGNOSIS — Z794 Long term (current) use of insulin: Secondary | ICD-10-CM

## 2015-05-25 MED ORDER — V-GO 20 KIT: PACK | Status: DC

## 2015-05-25 MED ORDER — INSULIN ASPART 100 UNIT/ML ~~LOC~~ SOLN: SUBCUTANEOUS | Status: DC

## 2015-05-25 NOTE — Patient Instructions (Signed)
Take sugars as follows:  Check blood sugars on waking up 3-4  times a week Also check blood sugars about 2 hours after a meal and do this after different meals by rotation  Recommended blood sugar levels on waking up is 90-130 and about 2 hours after meal is 130-160  Please bring your blood sugar monitor to each visit, thank you  If eating smaller meal may take 4-5 clicks instead of 6 clicks on pump  For larger meals may take 7 clicks

## 2015-05-25 NOTE — Progress Notes (Signed)
Patient ID: [name] [name], male   DOB: [birth year], 81 y.o.   MRN: [id number]   Reason for Appointment:  follow-up   History of Present Illness   Diagnosis: Type 2 DIABETES MELITUS, date of diagnosis: 2002   He has been on insulin since about 2008 with variable control. In 04/2012 his A1c was 10.1% His diabetes control was significantly better with adding Victoza in early 2014 and adjusting his dose of Levemir Also was able to take lower doses of Levemir than before  Recent history:   Insulin regimen:  V-go 20 units basal, boluses 12 units before each meal  This week because of difficulty controlling his diabetes  he was started on a V-go pump after instructions from the nurse educator instead of basal bolus insulin on 15 units Levemir twice a day and Novolog 15 units mealtime doses He has no difficulty using the pump and bolusing and is changing it  at 4 PM daily His glucose readings since then are as follows:   Fasting glucose 130 and 127  Late evening readings [phone] on the first day  After breakfast 157, 178  After meal at 6:30 PM 124 yesterday  Before lunch 139 today  Oral hypoglycemic drugs: None         Side effects from medications: None Proper timing of medications in relation to meals:  only if eating at home .         Monitors blood glucose: Once a day or less .    Glucometer: One Touch   Meals: 2-3 meals per day.eating breakfast at 11 am with either oatmeal/egg or apple/peanut butter; lunch 2-3 PM dinner 7 pm                  Physical activity: exercise:  Minimal.  He says his legs hurt on walking           Dietician visit: Most recent: 2010             Wt Readings from Last 3 Encounters:  05/25/15 247 lb 12.8 oz (112.401 kg)  05/18/15 244 lb 4 oz (110.791 kg)  04/11/15 251 lb 12.8 oz (114.216 kg)   Lab Results  Component Value Date   HGBA1C 7.6 05/18/2015   HGBA1C 7.9 02/16/2015   HGBA1C 7.6* 07/31/2014   Lab Results  Component Value Date     MICROALBUR 63.2* 02/20/2015   LDLCALC 180* 02/20/2015   CREATININE 1.90* 04/04/2015     OTHER problems: Reviewed in ROS       Medication List       This list is accurate as of: 05/25/15  8:40 PM.  Always use your most recent med list.               aspirin 81 MG tablet  Take 81 mg by mouth every other day.     carvedilol 25 MG tablet  Commonly known as:  COREG  TAKE 1 AND 1/2 TABLETS BY MOUTH TWICE A DAY     diazepam 10 MG tablet  Commonly known as:  VALIUM  TAKE 1/2 TO 1 TABLET BY MOUTH AT BEDTIMEAS NEEDED FOR ANXIETY OR RINGING IN EARS     ferrous sulfate 325 (65 FE) MG tablet  Commonly known as:  [employer] IRON  Take 1 tablet (325 mg total) by mouth daily with breakfast.     glucose blood test strip  Commonly known as:  ONETOUCH VERIO  Use as instructed to check blood sugar 3  times per day dx code E11.29     insulin aspart 100 UNIT/ML injection  Commonly known as:  novoLOG  Use max 56 units per day with V-Go pump dx code E11.29     Insulin Detemir 100 UNIT/ML Pen  Commonly known as:  LEVEMIR FLEXTOUCH  INJECT 15 UNITS INTO THE SKIN TWICE A DAY.     lisinopril 20 MG tablet  Commonly known as:  PRINIVIL,ZESTRIL  Take 1 tablet (20 mg total) by mouth daily.     metolazone 2.5 MG tablet  Commonly known as:  ZAROXOLYN  Take one tablet on 04/12/15 and a one tablet on 04/15/15- take this 30 minutes prior to your torsemide dose     nitroGLYCERIN 0.4 MG SL tablet  Commonly known as:  NITROSTAT  Place 1 tablet (0.4 mg total) under the tongue every 5 (five) minutes as needed. For chest pain     ONETOUCH DELICA LANCETS FINE Misc  Use to check blood sugar 3 times per day dx code 250.42     Pen Needles 31G X 5 MM Misc  Inject 15 Units as directed 2 (two) times daily. Use pen needles for pt insulin pen. Dx E11.29     torsemide 20 MG tablet  Commonly known as:  DEMADEX  Take 2 tablets (40 mg total) by mouth daily.     V-GO 20 Kit  Use 1 per day dx code E11.29         Allergies:  Allergies  Allergen Reactions  . Sulfa Antibiotics Swelling and Rash    Hands swell    Past Medical History  Diagnosis Date  . COPD (chronic obstructive pulmonary disease) (HCC) 1993  . Hyperlipidemia 1993  . Hypertension 1993  . CAD (coronary artery disease)     bypass graft surgery 05/2006; grafts were widely patent on cath 06/2010   . Ischemic cardiomyopathy     EF 25-30%; s/p single chamber ICD 2010; upgrade to Medtronic BiV ICD 01/30/12  . Systolic heart failure     class II  . Foot fracture 1953; 1962    left; left  . Chronic kidney disease   . Myocardial infarction [facility]) 2008; 01/06/2012  . Syncope and collapse 01/05/2012  . Shortness of breath 01/05/2012    "all the time; been going on for awhile"  . Diabetes mellitus, type 2 (HCC) 2000  . Arthritis     "hands" (01/06/2012)  . Elbow mass     left; "just noticed it ~ 2 wk ago"  (01/06/2012)  . Anxiety   . Nightmares     with psych eval 2012 at [facility]  . PTSD (post-traumatic stress disorder)   . Tinnitus of left ear   . 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 Boil     "fluid boil left arm" (01/30/2012)  . LBBB (left bundle branch block)     Past Surgical History  Procedure Laterality Date  . Coronary artery bypass graft  2008    3 vessel, class 4 CHF, non Q wave MI 05/19/06  . Appendectomy  1957  . Cardiac defibrillator placement  01/30/2012    biventricular ICD  . Fracture surgery  1953; ~ 1962    left; left  . Cardiac defibrillator placement  01/30/2012    biventricular ICD  . Insert / replace / remove pacemaker  01/30/2012    biventricular ICD placed  . Bi-ventricular implantable cardioverter defibrillator upgrade N/A 01/30/2012    Procedure: BI-VENTRICULAR IMPLANTABLE CARDIOVERTER DEFIBRILLATOR UPGRADE;  Surgeon: [name] [name]  [name] Najjar, MD;  Location: [facility] CATH LAB;  Service: Cardiovascular;  Laterality: N/A;    Family History  Problem Relation Age of Onset  . Heart disease Father     MI age 52  . Prostate  cancer Father   . Diabetes Sister   . Hyperlipidemia Sister   . Cancer Maternal Grandmother     liver, cirrhosis  . Depression Neg Hx   . Alcohol abuse Neg Hx   . Drug abuse Neg Hx   . Stroke Neg Hx   . Colon cancer Neg Hx     Social History:  reports that he has quit smoking. His smoking use included Cigarettes. He has a 17.5 pack-year smoking history. He has never used smokeless tobacco. He reports that he drinks about 4.2 oz of alcohol per week. He reports that he does not use illicit drugs.  Review of Systems:  HYPERTENSION treated with Coreg and lisinopril 20 mg with adequate control This is followed by PCP and cardiologist  HYPERLIPIDEMIA:   Has been managed by PCP and cardiologist  He has variable control especially with not being consistent with his medications despite reminders on each visit, was recommended 40 mg Crestor   Lab Results  Component Value Date   CHOL 241* 02/20/2015   HDL 34.00* 02/20/2015   LDLCALC 180* 02/20/2015   LDLDIRECT 94.0 07/31/2014   TRIG 135.0 02/20/2015   CHOLHDL 7 02/20/2015   CKD: His creatinine is generally stable and followed by nephrologist   Lab Results  Component Value Date   CREATININE 1.90* 04/04/2015    Last foot exam in 4/16 showed variable monofilament sensation in the toes and plantar surfaces, some areas have decreased sensation especially plantar surfaces.  Has no skin lesions or ulcers on the feet and normal pedal pulses      Examination:   BP 138/82 mmHg  Pulse 76  Temp(Src) 98.8 F (37.1 C)  Resp 16  Ht '5\' 8"'$  (1.727 m)  Wt 247 lb 12.8 oz (112.401 kg)  BMI 37.69 kg/m2  SpO2 94%  Body mass index is 37.69 kg/(m^2).    ASSESSMENT/ PLAN:   Diabetes type 2   So far his blood sugars appear to be excellent with starting the V-go pump even though he has been checking blood sugars very sporadically and irregularly Highest blood sugar is 178 Not clear if he has any high readings after supper Discussed that he  may need to adjust the boluses based on his meal size as he is currently taking 12 units with every meal; however he does not easy for him to follow detailed instructions Discussed the need to check readings after various meals and some fasting He will call if he has any further difficulties     Patient Instructions  Take sugars as follows:  Check blood sugars on waking up 3-4  times a week Also check blood sugars about 2 hours after a meal and do this after different meals by rotation  Recommended blood sugar levels on waking up is 90-130 and about 2 hours after meal is 130-160  Please bring your blood sugar monitor to each visit, thank you  If eating smaller meal may take 4-5 clicks instead of 6 clicks on pump  For larger meals may take 7 clicks          [name] [name] 05/25/2015, 8:40 PM

## 2015-05-28 ENCOUNTER — Telehealth: Payer: Self-pay | Admitting: Endocrinology

## 2015-05-28 NOTE — Telephone Encounter (Signed)
Need to know what his blood sugars are for the last 3 days He is eating better insulin delivery with the pump than with the shots and needs less insulin  He can reduce the clicks down to 4 clicks for the first 2 meals of the day and continue 6 clicks for his dinner.

## 2015-05-28 NOTE — Telephone Encounter (Signed)
Wife says his wife is doing 6 clicks three times a day, she's not sure that he's getting enough insulin.  She said he was taking 15 units three times a day and now he's only getting 12.  She said he's been sitting in his car for the last hour, wringing wet and very red, he asked for something sweet which his wife gave him, she tried checking his blood sugar but he refused.  She asked if she should give him more insulin and I advised her to check his sugar before doing that.  She was going to try again.  His sugar just now was 106 Please advise.

## 2015-05-28 NOTE — Telephone Encounter (Signed)
Noted, his wife is aware.

## 2015-05-28 NOTE — Telephone Encounter (Signed)
He should check readings after dinner at night more often He can reduce the bolus clicks to 4 units for lunch for now

## 2015-05-28 NOTE — Telephone Encounter (Signed)
Saturday- 9:38 am- 137       2:05 pm- 155  Sunday 7:22 am - 112   8:17 am 138   11:34 am  203    2:31 pm 298              5:48 pm- 102   7:22 pm 122  Today 7:56 am 93  8:38 am- 100  12:40 pm- 119  4:51 pm 106

## 2015-05-28 NOTE — Telephone Encounter (Signed)
Pt wife calling regarding the VGo, it seems to not be giving him enough insulin

## 2015-05-29 ENCOUNTER — Other Ambulatory Visit (HOSPITAL_COMMUNITY): Payer: Self-pay | Admitting: *Deleted

## 2015-05-29 NOTE — Telephone Encounter (Signed)
[name] [name] please check if he is having any hypoglycemia and what his postprandial readings are, thanks

## 2015-05-30 ENCOUNTER — Telehealth: Payer: Self-pay | Admitting: Nutrition

## 2015-05-30 NOTE — Telephone Encounter (Signed)
Patient called back saying that his blood sugar yesterday before breakfast was 73, and then 77.  He did not feel like it was low.  His FBS today was 165.  He took a reading yesterday before lunch and it was 126.  He did not test after that. Denies any low blood sugar symptoms.

## 2015-05-30 NOTE — Telephone Encounter (Signed)
noted 

## 2015-05-30 NOTE — Telephone Encounter (Signed)
Message left on machine yesterday and today to call with blood sugar readings--needing to see if any more low blood sugars.

## 2015-06-01 ENCOUNTER — Telehealth: Payer: Self-pay | Admitting: Endocrinology

## 2015-06-01 ENCOUNTER — Other Ambulatory Visit (HOSPITAL_COMMUNITY): Payer: Self-pay | Admitting: *Deleted

## 2015-06-01 DIAGNOSIS — I5022 Chronic systolic (congestive) heart failure: Secondary | ICD-10-CM

## 2015-06-01 MED ORDER — TORSEMIDE 20 MG PO TABS: 40.0000 mg | ORAL_TABLET | Freq: Every day | ORAL | Status: DC

## 2015-06-01 NOTE — Telephone Encounter (Signed)
Pt needs to find out VGo is approved but the copay is still 85 per kit, can we do something to help them

## 2015-06-04 ENCOUNTER — Telehealth: Payer: Self-pay | Admitting: *Deleted

## 2015-06-04 DIAGNOSIS — D649 Anemia, unspecified: Secondary | ICD-10-CM

## 2015-06-04 NOTE — Telephone Encounter (Signed)
Linda Please see below and advise if you have any ideas  Thanks

## 2015-06-04 NOTE — Telephone Encounter (Signed)
Faxed refill request 30 tablet 2 02/14/2015  Last office visit:   04/04/15  Please advise.  Marland Kitchen

## 2015-06-05 MED ORDER — DIAZEPAM 10 MG PO TABS: ORAL_TABLET | ORAL | Status: DC

## 2015-06-05 NOTE — Telephone Encounter (Signed)
Medication phoned to pharmacy.  

## 2015-06-05 NOTE — Telephone Encounter (Signed)
Please call in.  Did he ever go see GI? How is his memory now?  Let me know.

## 2015-06-05 NOTE — Telephone Encounter (Signed)
Left detailed message on voicemail of wife Manuela Schwartz) to return call with info.  Tried to phone med into pharmacy.  Pharmacy line has been busy for some time, ? Out of order.  Will try again later.

## 2015-06-08 ENCOUNTER — Other Ambulatory Visit (INDEPENDENT_AMBULATORY_CARE_PROVIDER_SITE_OTHER): Payer: PPO

## 2015-06-08 DIAGNOSIS — R748 Abnormal levels of other serum enzymes: Secondary | ICD-10-CM

## 2015-06-08 DIAGNOSIS — D649 Anemia, unspecified: Secondary | ICD-10-CM

## 2015-06-08 DIAGNOSIS — R7989 Other specified abnormal findings of blood chemistry: Secondary | ICD-10-CM

## 2015-06-08 LAB — BASIC METABOLIC PANEL
BUN: 21 mg/dL (ref 6–23)
CHLORIDE: 102 meq/L (ref 96–112)
CO2: 30 meq/L (ref 19–32)
CREATININE: 1.63 mg/dL — AB (ref 0.40–1.50)
Calcium: 9.3 mg/dL (ref 8.4–10.5)
GFR: 43.65 mL/min — ABNORMAL LOW (ref >60.00)
GLUCOSE: 144 mg/dL — AB (ref 70–99)
POTASSIUM: 4.2 meq/L (ref 3.5–5.1)
Sodium: 138 mEq/L (ref 135–145)

## 2015-06-08 LAB — CBC WITH DIFFERENTIAL/PLATELET
BASOS PCT: 0.4 % (ref 0.0–3.0)
Basophils Absolute: 0 10*3/uL (ref 0.0–0.1)
EOS ABS: 0.1 10*3/uL (ref 0.0–0.7)
EOS PCT: 1.6 % (ref 0.0–5.0)
HCT: 33.2 % — ABNORMAL LOW (ref 39.0–52.0)
HEMOGLOBIN: 10.8 g/dL — AB (ref 13.0–17.0)
LYMPHS ABS: 1.4 10*3/uL (ref 0.7–4.0)
Lymphocytes Relative: 18.2 % (ref 12.0–46.0)
MCHC: 32.5 g/dL (ref 30.0–36.0)
MCV: 83.8 fl (ref 78.0–100.0)
MONO ABS: 0.8 10*3/uL (ref 0.1–1.0)
Monocytes Relative: 9.9 % (ref 3.0–12.0)
NEUTROS ABS: 5.5 10*3/uL (ref 1.4–7.7)
NEUTROS PCT: 69.9 % (ref 43.0–77.0)
PLATELETS: 288 10*3/uL (ref 150.0–400.0)
RBC: 3.97 Mil/uL — ABNORMAL LOW (ref 4.22–5.81)
RDW: 16 % — AB (ref 11.5–15.5)
WBC: 7.8 10*3/uL (ref 4.0–10.5)

## 2015-06-08 NOTE — Telephone Encounter (Signed)
Patient's wife Manuela Schwartz) says patient has not seen GI and says he is not going to see one unless he feels like it.  Wife says his memory is about the same.  He does have an annoying cough.  Manuela Schwartz says that with taking so many fluid pills, he is experiencing some incontinence so he took it upon himself to cut back on the fluid pills but now is experiencing some swelling in his feet and he says when he lies down, he can't breathe.  Wife says she really doesn't know what to say, "he is a stubborn [place]).

## 2015-06-08 NOTE — Addendum Note (Signed)
Addended by: Tonia Ghent on: 06/08/2015 01:47 PM   Modules accepted: Orders

## 2015-06-08 NOTE — Telephone Encounter (Signed)
Wife advised.  Patient is coming at 2 pm today for labs so that results will be in for his appt with Dr. Aundra Dubin on Monday in addition to your review as well.  Please be sure the order is in, please.

## 2015-06-08 NOTE — Telephone Encounter (Signed)
Orders are in   Thanks

## 2015-06-08 NOTE — Telephone Encounter (Signed)
I would go back on the fluid pills per routine as he is likely to go back into heart failure. F/u here is not improved back on the fluid pill.   Encourage GI eval.  Overdue for f/u BMET.  At minimum, needs lab visit.  Thanks.

## 2015-06-11 ENCOUNTER — Encounter: Payer: Self-pay | Admitting: Cardiology

## 2015-06-11 ENCOUNTER — Ambulatory Visit (INDEPENDENT_AMBULATORY_CARE_PROVIDER_SITE_OTHER): Payer: PPO | Admitting: Cardiology

## 2015-06-11 VITALS — BP 134/62 | HR 68 | Ht 68.0 in | Wt 247.0 lb

## 2015-06-11 DIAGNOSIS — I5022 Chronic systolic (congestive) heart failure: Secondary | ICD-10-CM

## 2015-06-11 DIAGNOSIS — I2510 Atherosclerotic heart disease of native coronary artery without angina pectoris: Secondary | ICD-10-CM

## 2015-06-11 DIAGNOSIS — N183 Chronic kidney disease, stage 3 unspecified: Secondary | ICD-10-CM

## 2015-06-11 MED ORDER — ROSUVASTATIN CALCIUM 40 MG PO TABS: 40.0000 mg | ORAL_TABLET | Freq: Every day | ORAL | Status: DC

## 2015-06-11 MED ORDER — TORSEMIDE 20 MG PO TABS
80.0000 mg | ORAL_TABLET | Freq: Every day | ORAL | Status: DC
Start: 2015-06-11 — End: 2015-06-22

## 2015-06-11 MED ORDER — SPIRONOLACTONE 25 MG PO TABS: 12.5000 mg | ORAL_TABLET | Freq: Every day | ORAL | Status: DC

## 2015-06-11 NOTE — Progress Notes (Signed)
Patient ID: [name] [name], male   DOB: 1934-06-25, 81 y.o.   MRN: [id number] PCP: Dr. Damita Dunnings  81 yo with history of CAD s/p CABG, ischemic cardiomyopathy/systolic CHF, LBBB, COPD, and CKD presents for cardiology followup.  He had CABG in 2008.  Last cath in 3/12 showed patent grafts.  EF was 20-25%.  He was admitted in 10/13 with acute on chronic systolic CHF.  He was volume overloaded. He had a mild TnI elevation with no chest pain.  He was diuresed and discharged.  After that, his ICD was upgraded to a Medtronic CRT-D system.  EF improved to 35-40% on echo in 10/14. I have not seen him since 2015.     He is not very communicative today, seems slow of speech and thought.   Most of history comes from family.  He has been sleeping in his recliner due to orthopnea for a while now.  No chest pain.  He seems to be short of breath walking short distances. He had to stop once walking in here today. He is using CPAP at night.  Labs (10/13): K 3.6 => 4.2, creatinine 1.79 => 2.1 => 2.0, HDL 45, LDL 157, BNP 219 Labs (12/13): K 4, creatinine 1.9, BNP 209 Labs (1/14): LDL 61, HDL 46, LFTs normal Labs (2/14): BNP 199 Labs (3/14): K 4.1, creatinine 2.0 Labs (5/14): K 4.9, creatinine 3.0 Labs (8/14): K 4.2, creatinine 1.84 Labs (10/14): K 3.8, creatinine 1.7 Labs (2/15): K 3.6, creatinine 1.6, LDL 84, HDL 38 Labs (11/16): LDL 180, HDL 34 Labs (3/17): K 4.2, creatinine 1.63  PMH: 1. CAD: S/p CABG 2008.  Cath 3/12 with totally occluded mLAD and RCA, 80% LCx stenosis, patent LIMA-LAD, patent SVG-OM, 40% stenosis in SVG-PDA.   2. Ischemic cardiomyopathy: Echo (10/13) with severely dilated LV, moderate LVH, EF 20-25%, mild MR.  Medtronic CRT-D device 10/13.  Echo (10/14) with severely dilated LV, mild LVH, EF 35-40% (improved).  3. LBBB 4. COPD 5. Type II diabetes 6. Hyperlipidemia 7. HTN 8. CKD 9. Obesity 10. OA: knees. 11. PTSD 12. H/o ETOH abuse 13. OSA: Severe by sleep study, on CPAP.  14. PAD: ABIs  (5/14) with 0.75 right, 0.80 left.   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   FH: Father with MI at 33  ROS: All systems reviewed and negative except as per HPI.   Current Outpatient Prescriptions  Medication Sig Dispense Refill  . aspirin 81 MG tablet Take 81 mg by mouth every other day.     . carvedilol (COREG) 25 MG tablet TAKE 1 AND 1/2 TABLETS BY MOUTH TWICE A DAY 90 tablet 3  . diazepam (VALIUM) 10 MG tablet TAKE 1/2 TO 1 TABLET BY MOUTH AT BEDTIMEAS NEEDED FOR ANXIETY OR RINGING IN EARS 30 tablet 1  . ferrous sulfate (CVS IRON) 325 (65 FE) MG tablet Take 1 tablet (325 mg total) by mouth daily with breakfast. 90 tablet 3  . glucose blood (ONETOUCH VERIO) test strip Use as instructed to check blood sugar 3 times per day dx code E11.29 100 each 3  . Insulin Detemir (LEVEMIR FLEXTOUCH) 100 UNIT/ML Pen INJECT 15 UNITS INTO THE SKIN TWICE A DAY.    Marland Kitchen Insulin Disposable Pump (V-GO 20) KIT Use 1 per day dx code E11.29 1 kit 3  . Insulin Pen Needle (PEN NEEDLES) 31G X 5 MM MISC Inject 15 Units as directed 2 (two) times daily. Use pen needles for pt insulin pen.  Dx E11.29 100 each 5  . lisinopril (PRINIVIL,ZESTRIL) 20 MG tablet Take 1 tablet (20 mg total) by mouth daily.    . metolazone (ZAROXOLYN) 2.5 MG tablet Take one tablet on 04/12/15 and a one tablet on 04/15/15- take this 30 minutes prior to your torsemide dose 5 tablet 0  . nitroGLYCERIN (NITROSTAT) 0.4 MG SL tablet Place 1 tablet (0.4 mg total) under the tongue every 5 (five) minutes as needed. For chest pain 25 tablet 11  . ONETOUCH DELICA LANCETS FINE MISC Use to check blood sugar 3 times per day dx code 250.42 100 each 3  . torsemide (DEMADEX) 20 MG tablet Take 4 tablets (80 mg total) by mouth daily. 360 tablet 3  . rosuvastatin (CRESTOR) 40 MG tablet Take 1 tablet (40 mg total) by mouth daily. 90 tablet 3  . spironolactone (ALDACTONE) 25 MG tablet Take 0.5 tablets (12.5 mg  total) by mouth daily. 45 tablet 3   No current facility-administered medications for this visit.    BP 134/62 mmHg  Pulse 68  Ht '5\' 8"'$  (1.727 m)  Wt 247 lb (112.038 kg)  BMI 37.56 kg/m2 General: NAD, obese Neck: Thick, JVP 8-9 cm, no thyromegaly or thyroid nodule.  Lungs: Clear to auscultation bilaterally with normal respiratory effort. CV: Nondisplaced PMI.  Heart regular S1/S2, no S3/S4, no murmur.  1+ edema to knees bilaterally.  No carotid bruit.  Difficult to palpate PT pulses.   Abdomen: Soft, nontender, no hepatosplenomegaly, no distention.  Neurologic: Alert and oriented x 3.  Psych: Normal affect. Extremities: No clubbing or cyanosis.   Assessment/Plan: 1. Chronic systolic CHF: EF 03-15% by last echo in 2014.  Status post Medtronic CRT-D upgrade. NYHA class III symptoms with volume overload on exam.    - Increase torsemide to 80 mg daily, BMET in 1 week.  - Add spironolactone 12.5 daily.  - Continue Coreg and lisinopril.    - I will arrange for echo.  2. CAD: s/p CABG.  No recent chest pain.  Last cath in 3/12 with patent grafts.  - Continue ASA 81. - Needs to go back on statin, will start Crestor 40 mg daily (was his med in the past).    3. Hyperlipidemia: LDL very high, restart Crestor 40 with lipids/LFTs in 2 months.     4. OSA: Severe OSA, on CPAP.  5. CKD: BMET in 1 week.  6. PAD: Claudication improved with weight loss.  Not candidate for cilostazol given CHF.     Followup in 2 wks in CHF clinic.   [name] 06/11/2015

## 2015-06-11 NOTE — Patient Instructions (Signed)
Your physician has recommended you make the following change in your medication:  1.) start Crestor 40 mg once a day for cholesterol 2.) start spironolactone 12.5 mg (1/2 tablet) once a day for blood pressure 3.) increase Torsemide to 80 mg (4 tablets) once a day for swelling/fluid  Your physician recommends that you return for lab work in: 1 week (BMET)  Your physician has requested that you have an echocardiogram. Echocardiography is a painless test that uses sound waves to create images of your heart. It provides your doctor with information about the size and shape of your heart and how well your heart's chambers and valves are working. This procedure takes approximately one hour. There are no restrictions for this procedure.  Your physician recommends that you schedule a follow-up appointment in: 2 weeks with Dr. Aundra Dubin at the [facility].  Please overbook if necessary per Dr. Aundra Dubin.

## 2015-06-13 ENCOUNTER — Telehealth (HOSPITAL_COMMUNITY): Payer: Self-pay | Admitting: Vascular Surgery

## 2015-06-13 NOTE — Telephone Encounter (Signed)
Left pt message to make appt w/[facility]

## 2015-06-13 NOTE — Telephone Encounter (Signed)
Left pt detailed message giving him his 2 week f/u w/ [facility] in hf clinic, I will send pt letter w./ app, time , date and direction to HF clinic

## 2015-06-19 ENCOUNTER — Ambulatory Visit (HOSPITAL_COMMUNITY): Payer: PPO | Attending: Internal Medicine

## 2015-06-19 ENCOUNTER — Other Ambulatory Visit (INDEPENDENT_AMBULATORY_CARE_PROVIDER_SITE_OTHER): Payer: PPO | Admitting: *Deleted

## 2015-06-19 ENCOUNTER — Other Ambulatory Visit: Payer: Self-pay

## 2015-06-19 DIAGNOSIS — I5022 Chronic systolic (congestive) heart failure: Secondary | ICD-10-CM

## 2015-06-19 DIAGNOSIS — I77810 Thoracic aortic ectasia: Secondary | ICD-10-CM | POA: Diagnosis not present

## 2015-06-19 DIAGNOSIS — I340 Nonrheumatic mitral (valve) insufficiency: Secondary | ICD-10-CM | POA: Diagnosis not present

## 2015-06-19 DIAGNOSIS — I517 Cardiomegaly: Secondary | ICD-10-CM | POA: Insufficient documentation

## 2015-06-19 DIAGNOSIS — I509 Heart failure, unspecified: Secondary | ICD-10-CM | POA: Diagnosis not present

## 2015-06-19 DIAGNOSIS — I071 Rheumatic tricuspid insufficiency: Secondary | ICD-10-CM | POA: Insufficient documentation

## 2015-06-19 LAB — BASIC METABOLIC PANEL
BUN: 42 mg/dL — AB (ref 7–25)
CALCIUM: 9.4 mg/dL (ref 8.6–10.3)
CO2: 32 mmol/L — AB (ref 20–31)
Chloride: 97 mmol/L — ABNORMAL LOW (ref 98–110)
Creat: 2.16 mg/dL — ABNORMAL HIGH (ref 0.70–1.18)
GLUCOSE: 201 mg/dL — AB (ref 65–99)
Potassium: 4.2 mmol/L (ref 3.5–5.3)
SODIUM: 142 mmol/L (ref 135–146)

## 2015-06-22 ENCOUNTER — Telehealth: Payer: Self-pay

## 2015-06-22 DIAGNOSIS — I5022 Chronic systolic (congestive) heart failure: Secondary | ICD-10-CM

## 2015-06-22 DIAGNOSIS — Z79899 Other long term (current) drug therapy: Secondary | ICD-10-CM

## 2015-06-22 MED ORDER — TORSEMIDE 20 MG PO TABS: 60.0000 mg | ORAL_TABLET | Freq: Every day | ORAL | Status: DC

## 2015-06-22 NOTE — Telephone Encounter (Signed)
Patient's wife (DPR) called to return nurse's call. Informed patient's wife of lab and echo results. Per Dr. Aundra Dubin, creatinine is up, cut back torsemide to 60 mg daily with BMET again in 1 week. Echo showed EF stable at 35-40% (moderately depressed). Patient's wife verbalized understanding. Patient has an appointment on Monday with Dr. Aundra Dubin, and will schedule lab work at that appointment.

## 2015-06-22 NOTE — Telephone Encounter (Signed)
-----   Message from Larey Dresser, MD sent at 06/20/2015  1:43 PM EDT ----- Creatinine is up, cut back torsemide to 60 mg daily with BMET again in 1 week.

## 2015-06-25 ENCOUNTER — Ambulatory Visit (HOSPITAL_COMMUNITY)
Admission: RE | Admit: 2015-06-25 | Discharge: 2015-06-25 | Disposition: A | Discharge status: Home / Self Care | Payer: PPO | Source: Ambulatory Visit | Attending: Cardiology | Admitting: Cardiology

## 2015-06-25 VITALS — BP 112/70 | HR 70 | Wt 239.5 lb

## 2015-06-25 DIAGNOSIS — C44229 Squamous cell carcinoma of skin of left ear and external auricular canal: Secondary | ICD-10-CM | POA: Diagnosis not present

## 2015-06-25 DIAGNOSIS — N189 Chronic kidney disease, unspecified: Secondary | ICD-10-CM | POA: Diagnosis not present

## 2015-06-25 DIAGNOSIS — I5022 Chronic systolic (congestive) heart failure: Secondary | ICD-10-CM

## 2015-06-25 DIAGNOSIS — D485 Neoplasm of uncertain behavior of skin: Secondary | ICD-10-CM | POA: Diagnosis not present

## 2015-06-25 DIAGNOSIS — Z1283 Encounter for screening for malignant neoplasm of skin: Secondary | ICD-10-CM | POA: Diagnosis not present

## 2015-06-25 DIAGNOSIS — N183 Chronic kidney disease, stage 3 unspecified: Secondary | ICD-10-CM

## 2015-06-25 DIAGNOSIS — L821 Other seborrheic keratosis: Secondary | ICD-10-CM | POA: Diagnosis not present

## 2015-06-25 DIAGNOSIS — J449 Chronic obstructive pulmonary disease, unspecified: Secondary | ICD-10-CM | POA: Diagnosis not present

## 2015-06-25 DIAGNOSIS — G4733 Obstructive sleep apnea (adult) (pediatric): Secondary | ICD-10-CM | POA: Insufficient documentation

## 2015-06-25 DIAGNOSIS — I2510 Atherosclerotic heart disease of native coronary artery without angina pectoris: Secondary | ICD-10-CM | POA: Diagnosis not present

## 2015-06-25 DIAGNOSIS — D229 Melanocytic nevi, unspecified: Secondary | ICD-10-CM | POA: Diagnosis not present

## 2015-06-25 DIAGNOSIS — I130 Hypertensive heart and chronic kidney disease with heart failure and stage 1 through stage 4 chronic kidney disease, or unspecified chronic kidney disease: Secondary | ICD-10-CM | POA: Insufficient documentation

## 2015-06-25 DIAGNOSIS — L820 Inflamed seborrheic keratosis: Secondary | ICD-10-CM | POA: Diagnosis not present

## 2015-06-25 DIAGNOSIS — Z951 Presence of aortocoronary bypass graft: Secondary | ICD-10-CM | POA: Insufficient documentation

## 2015-06-25 DIAGNOSIS — C44219 Basal cell carcinoma of skin of left ear and external auricular canal: Secondary | ICD-10-CM | POA: Diagnosis not present

## 2015-06-25 DIAGNOSIS — L812 Freckles: Secondary | ICD-10-CM | POA: Diagnosis not present

## 2015-06-25 DIAGNOSIS — L578 Other skin changes due to chronic exposure to nonionizing radiation: Secondary | ICD-10-CM | POA: Diagnosis not present

## 2015-06-25 DIAGNOSIS — L570 Actinic keratosis: Secondary | ICD-10-CM | POA: Diagnosis not present

## 2015-06-25 DIAGNOSIS — Z85828 Personal history of other malignant neoplasm of skin: Secondary | ICD-10-CM | POA: Diagnosis not present

## 2015-06-25 LAB — BASIC METABOLIC PANEL
ANION GAP: 17 — AB (ref 5–15)
BUN: 34 mg/dL — AB (ref 6–20)
CHLORIDE: 98 mmol/L — AB (ref 101–111)
CO2: 26 mmol/L (ref 22–32)
Calcium: 9.7 mg/dL (ref 8.9–10.3)
Creatinine, Ser: 2.63 mg/dL — ABNORMAL HIGH (ref 0.61–1.24)
GFR calc Af Amer: 25 mL/min — ABNORMAL LOW (ref >60)
GFR calc non Af Amer: 22 mL/min — ABNORMAL LOW (ref >60)
Glucose, Bld: 125 mg/dL — ABNORMAL HIGH (ref 65–99)
POTASSIUM: 4.1 mmol/L (ref 3.5–5.1)
Sodium: 141 mmol/L (ref 135–145)

## 2015-06-25 LAB — BRAIN NATRIURETIC PEPTIDE: B NATRIURETIC PEPTIDE 5: 315 pg/mL — AB (ref 0.0–100.0)

## 2015-06-25 MED ORDER — TORSEMIDE 20 MG PO TABS: ORAL_TABLET | ORAL | Status: DC

## 2015-06-25 NOTE — Patient Instructions (Addendum)
Change Torsemide to 40 mg daily.   Labs today  Lab in 1 week  Your physician recommends that you schedule a follow-up appointment in: 1 month

## 2015-06-26 NOTE — Progress Notes (Signed)
Patient ID: [name] [name], male   DOB: 08-30-1934, 81 y.o.   MRN: [id number] PCP: Dr. Para March  81 yo with history of CAD s/p CABG, ischemic cardiomyopathy/systolic CHF, LBBB, COPD, and CKD presents for cardiology followup.  He had CABG in 2008.  Last cath in 3/12 showed patent grafts.  EF was 20-25%.  He was admitted in 10/13 with acute on chronic systolic CHF.  He was volume overloaded. He had a mild TnI elevation with no chest pain.  He was diuresed and discharged.  After that, his ICD was upgraded to a Medtronic CRT-D system.  EF improved to 35-40% on echo in 10/14. At last apppointment, he was noted to be volume overloaded and torsemide was increased.   Today, weight is down 8 lbs.  I had him cut back torsemide to 60 mg daily with creatinine up to 2.16.  Creatinine today is even higher, at 2.6.  His wife thinks that his breathing is better.  He is difficult to communicate with due to deafness.  He can walk around the house without dyspnea. Still limited by knee pain.  He is no longer sleeping in the recliner and is back in his bed on 2 pillows.  No chest pain.  He is using CPAP at night.  Labs (10/13): K 3.6 => 4.2, creatinine 1.79 => 2.1 => 2.0, HDL 45, LDL 157, BNP 219 Labs (12/13): K 4, creatinine 1.9, BNP 209 Labs (1/14): LDL 61, HDL 46, LFTs normal Labs (3/49): BNP 199 Labs (3/14): K 4.1, creatinine 2.0 Labs (5/14): K 4.9, creatinine 3.0 Labs (8/14): K 4.2, creatinine 1.84 Labs (10/14): K 3.8, creatinine 1.7 Labs (2/15): K 3.6, creatinine 1.6, LDL 84, HDL 38 Labs (11/16): LDL 180, HDL 34 Labs (3/17): K 4.2 => 4, creatinine 1.63 => 2.16 => 2.6  PMH: 1. CAD: S/p CABG 2008.  Cath 3/12 with totally occluded mLAD and RCA, 80% LCx stenosis, patent LIMA-LAD, patent SVG-OM, 40% stenosis in SVG-PDA.   2. Ischemic cardiomyopathy: Echo (10/13) with severely dilated LV, moderate LVH, EF 20-25%, mild MR.  Medtronic CRT-D device 10/13.  Echo (10/14) with severely dilated LV, mild LVH, EF 35-40%  (improved).  3. LBBB 4. COPD 5. Type II diabetes 6. Hyperlipidemia 7. HTN 8. CKD 9. Obesity 10. OA: knees. 11. PTSD 12. H/o ETOH abuse 13. OSA: Severe by sleep study, on CPAP.  14. PAD: ABIs (5/14) with 0.75 right, 0.80 left.   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   FH: Father with MI at 64  ROS: All systems reviewed and negative except as per HPI.   Current Outpatient Prescriptions  Medication Sig Dispense Refill  . aspirin 81 MG tablet Take 81 mg by mouth every other day.     . carvedilol (COREG) 25 MG tablet TAKE 1 AND 1/2 TABLETS BY MOUTH TWICE A DAY 90 tablet 3  . diazepam (VALIUM) 10 MG tablet TAKE 1/2 TO 1 TABLET BY MOUTH AT BEDTIMEAS NEEDED FOR ANXIETY OR RINGING IN EARS 30 tablet 1  . ferrous sulfate (CVS IRON) 325 (65 FE) MG tablet Take 1 tablet (325 mg total) by mouth daily with breakfast. 90 tablet 3  . glucose blood (ONETOUCH VERIO) test strip Use as instructed to check blood sugar 3 times per day dx code E11.29 100 each 3  . Insulin Detemir (LEVEMIR FLEXTOUCH) 100 UNIT/ML Pen INJECT 15 UNITS INTO THE SKIN TWICE A DAY.    Marland Kitchen Insulin Disposable Pump (V-GO 20) KIT  Use 1 per day dx code E11.29 1 kit 3  . Insulin Pen Needle (PEN NEEDLES) 31G X 5 MM MISC Inject 15 Units as directed 2 (two) times daily. Use pen needles for pt insulin pen. Dx E11.29 100 each 5  . lisinopril (PRINIVIL,ZESTRIL) 20 MG tablet Take 1 tablet (20 mg total) by mouth daily.    . metolazone (ZAROXOLYN) 2.5 MG tablet Take one tablet on 04/12/15 and a one tablet on 04/15/15- take this 30 minutes prior to your torsemide dose 5 tablet 0  . nitroGLYCERIN (NITROSTAT) 0.4 MG SL tablet Place 1 tablet (0.4 mg total) under the tongue every 5 (five) minutes as needed. For chest pain 25 tablet 11  . ONETOUCH DELICA LANCETS FINE MISC Use to check blood sugar 3 times per day dx code 250.42 100 each 3  . rosuvastatin (CRESTOR) 40 MG tablet Take 1 tablet (40 mg  total) by mouth daily. 90 tablet 3  . spironolactone (ALDACTONE) 25 MG tablet Take 0.5 tablets (12.5 mg total) by mouth daily. 45 tablet 3  . torsemide (DEMADEX) 20 MG tablet Take 4 tabs every other day ALTERNATING with 3 tabs every other day 360 tablet 3   No current facility-administered medications for this encounter.    BP 112/70 mmHg  Pulse 70  Wt 239 lb 8 oz (108.636 kg)  SpO2 95% General: NAD, obese Neck: Thick, JVP 7-8 cm, no thyromegaly or thyroid nodule.  Lungs: Clear to auscultation bilaterally with normal respiratory effort. CV: Nondisplaced PMI.  Heart regular S1/S2, no S3/S4, no murmur.  Trace ankle edema.  No carotid bruit.  Difficult to palpate PT pulses.   Abdomen: Soft, nontender, no hepatosplenomegaly, no distention.  Neurologic: Alert and oriented x 3.  Psych: Normal affect. Extremities: No clubbing or cyanosis.   Assessment/Plan: 1. Chronic systolic CHF: EF 93-81% by last echo in 2014.  Status post Medtronic CRT-D upgrade. NYHA class II-III symptoms.  Weight is down 8 lbs and volume status looks better.    - Decrease torsemide back to 40 mg daily with rise in creatinine to 2.6. BMET in 1 week.    - Continue Coreg, spironolactone, and lisinopril. If creatinine does not improve, will have to cut back on lisinopril.  - I will arrange for echo.  2. CAD: s/p CABG.  No recent chest pain.  Last cath in 3/12 with patent grafts.  - Continue ASA 81. - He is back on Crestor.    3. Hyperlipidemia: Last LDL very high, he has restarted Crestor 40.  Will check lipids/LFTs in 2 months.     4. OSA: Severe OSA, on CPAP.  5. CKD: Creatinine increased with diuresis.  BMET in 1 week.  6. PAD: Claudication improved with weight loss.  Not candidate for cilostazol given CHF.     Followup in 1 month.    [name] 06/26/2015

## 2015-07-06 ENCOUNTER — Other Ambulatory Visit (HOSPITAL_COMMUNITY): Payer: PPO

## 2015-07-09 ENCOUNTER — Other Ambulatory Visit (HOSPITAL_COMMUNITY): Payer: PPO

## 2015-07-11 ENCOUNTER — Other Ambulatory Visit (INDEPENDENT_AMBULATORY_CARE_PROVIDER_SITE_OTHER): Payer: PPO | Admitting: *Deleted

## 2015-07-11 ENCOUNTER — Ambulatory Visit (INDEPENDENT_AMBULATORY_CARE_PROVIDER_SITE_OTHER): Payer: PPO | Admitting: *Deleted

## 2015-07-11 ENCOUNTER — Encounter: Payer: Self-pay | Admitting: Internal Medicine

## 2015-07-11 DIAGNOSIS — Z95810 Presence of automatic (implantable) cardiac defibrillator: Secondary | ICD-10-CM

## 2015-07-11 DIAGNOSIS — Z79899 Other long term (current) drug therapy: Secondary | ICD-10-CM | POA: Diagnosis not present

## 2015-07-11 DIAGNOSIS — I5022 Chronic systolic (congestive) heart failure: Secondary | ICD-10-CM

## 2015-07-11 LAB — CUP PACEART INCLINIC DEVICE CHECK
Battery Voltage: 2.99 V
Brady Statistic AP VP Percent: 0.35 %
Brady Statistic AP VS Percent: 0.01 %
Brady Statistic AS VP Percent: 99.29 %
Brady Statistic AS VS Percent: 0.34 %
Brady Statistic RA Percent Paced: 0.36 %
Brady Statistic RV Percent Paced: 99.65 %
Date Time Interrogation Session: 20170405150047
HighPow Impedance: 40 Ohm
HighPow Impedance: 437 Ohm
HighPow Impedance: 49 Ohm
Implantable Lead Implant Date: 20091229
Implantable Lead Implant Date: 20131025
Implantable Lead Implant Date: 20131025
Implantable Lead Location: 753858
Implantable Lead Location: 753859
Implantable Lead Location: 753860
Implantable Lead Model: 4196
Implantable Lead Model: 5076
Implantable Lead Model: 6947
Lead Channel Impedance Value: 380 Ohm
Lead Channel Impedance Value: 437 Ohm
Lead Channel Impedance Value: 437 Ohm
Lead Channel Impedance Value: 570 Ohm
Lead Channel Impedance Value: 703 Ohm
Lead Channel Pacing Threshold Amplitude: 0.75 V
Lead Channel Pacing Threshold Amplitude: 1 V
Lead Channel Pacing Threshold Amplitude: 1 V
Lead Channel Pacing Threshold Pulse Width: 0.4 ms
Lead Channel Pacing Threshold Pulse Width: 0.4 ms
Lead Channel Pacing Threshold Pulse Width: 0.4 ms
Lead Channel Sensing Intrinsic Amplitude: 23.125 mV
Lead Channel Sensing Intrinsic Amplitude: 4 mV
Lead Channel Setting Pacing Amplitude: 2 V
Lead Channel Setting Pacing Amplitude: 2 V
Lead Channel Setting Pacing Amplitude: 2.5 V
Lead Channel Setting Pacing Pulse Width: 0.4 ms
Lead Channel Setting Pacing Pulse Width: 0.4 ms
Lead Channel Setting Sensing Sensitivity: 0.3 mV

## 2015-07-11 LAB — BASIC METABOLIC PANEL
BUN: 30 mg/dL — ABNORMAL HIGH (ref 7–25)
CO2: 30 mmol/L (ref 20–31)
Calcium: 9.4 mg/dL (ref 8.6–10.3)
Chloride: 101 mmol/L (ref 98–110)
Creat: 2.14 mg/dL — ABNORMAL HIGH (ref 0.70–1.18)
Glucose, Bld: 247 mg/dL — ABNORMAL HIGH (ref 65–99)
Potassium: 4.3 mmol/L (ref 3.5–5.3)
Sodium: 139 mmol/L (ref 135–146)

## 2015-07-11 NOTE — Progress Notes (Signed)
CRT-D device check in office. Thresholds and sensing consistent with previous device measurements. Lead impedance trends stable over time. No mode switch episodes recorded. No ventricular arrhythmia episodes recorded. Patient bi-ventricularly pacing 98.9% of the time. Device programmed with appropriate safety margins. Heart failure diagnostics reviewed and trends are stable for patient. No changes made this session. Battery @ 2.99V (RRT=2.63V).  Patient declines remote follow up. ROV with device clinic 10/17/15, Echo with SK in Jan 2018.

## 2015-07-12 DIAGNOSIS — M1712 Unilateral primary osteoarthritis, left knee: Secondary | ICD-10-CM | POA: Diagnosis not present

## 2015-07-23 ENCOUNTER — Other Ambulatory Visit: Payer: Self-pay | Admitting: Cardiology

## 2015-07-23 ENCOUNTER — Ambulatory Visit: Payer: PPO | Admitting: Endocrinology

## 2015-07-24 DIAGNOSIS — H25013 Cortical age-related cataract, bilateral: Secondary | ICD-10-CM | POA: Diagnosis not present

## 2015-07-24 LAB — HM DIABETES EYE EXAM

## 2015-07-25 ENCOUNTER — Encounter: Payer: Self-pay | Admitting: Endocrinology

## 2015-07-25 ENCOUNTER — Ambulatory Visit (INDEPENDENT_AMBULATORY_CARE_PROVIDER_SITE_OTHER): Payer: PPO | Admitting: Endocrinology

## 2015-07-25 ENCOUNTER — Encounter: Payer: Self-pay | Admitting: *Deleted

## 2015-07-25 VITALS — BP 122/60 | HR 68 | Temp 97.8°F | Resp 14 | Ht 68.0 in | Wt 241.2 lb

## 2015-07-25 DIAGNOSIS — Z794 Long term (current) use of insulin: Secondary | ICD-10-CM | POA: Diagnosis not present

## 2015-07-25 DIAGNOSIS — E1165 Type 2 diabetes mellitus with hyperglycemia: Secondary | ICD-10-CM | POA: Diagnosis not present

## 2015-07-25 DIAGNOSIS — E785 Hyperlipidemia, unspecified: Secondary | ICD-10-CM

## 2015-07-25 LAB — POCT GLYCOSYLATED HEMOGLOBIN (HGB A1C): Hemoglobin A1C: 7.4

## 2015-07-25 NOTE — Progress Notes (Signed)
Patient ID: [name] [name], male   DOB: 1934-11-02, 81 y.o.   MRN: [id number]   Reason for Appointment:  follow-up   History of Present Illness   Diagnosis: Type 2 DIABETES MELITUS, date of diagnosis: 2002   He has been on insulin since about 2008 with variable control. In 04/2012 his A1c was 10.1% His diabetes control was significantly better with adding Victoza in early 2014 and adjusting his dose of Levemir Also was able to take lower doses of Levemir than before  Recent history:   Insulin regimen:  V-go 20 units basal, boluses 12 units before each meal, 1-3 meals daily  He has no difficulty using the V-go pump and bolusing at mealtimes He says he is changing at every 24 hours but not clear what time His A1c is relatively better than before at 7.4  Current management, blood sugar patterns and problems identified:  His glucose readings appear to be relatively high in the mornings although relatively better before his breakfast around 10 AM  He is not giving any consistent answers and difficulty with a history of form today regarding his management.  He is asking about whether he should bolus when he wakes up in the morning or when he has breakfast late morning  He is usually taking 6 clicks with every meal although he says that he is eating less in the evenings  He has no hypoglycemia but he thinks occasionally blood sugars are low normal after boluses, lowest blood sugars are usually before noon  Blood sugars after lunch and evening meal are quite variable  His blood sugars averaging between 120 and 187 at any given time of the day   Oral hypoglycemic drugs: None         Side effects from medications: None Proper timing of medications in relation to meals:  only if eating at home .         Monitors blood glucose: Once a day or less .    Glucometer: One Touch  Mean values apply above for all meters except median for One Touch  PRE-MEAL Fasting Lunch Afternoon   Bedtime Overall  Glucose range: 102-209  86-184  124-197  126-255    Mean/median: 160   165  170  159    Meals: 2-3 meals per day.eating breakfast at 9-10 am with either oatmeal/egg or biscuit; lunch 2-3 PM dinner 7 pm.  May skip 1 or 2 meals a day                  Physical activity: exercise:  Minimal.  He says his legs hurt on walking           Dietician visit: Most recent: 2010             Wt Readings from Last 3 Encounters:  07/25/15 241 lb 3.2 oz (109.408 kg)  06/25/15 239 lb 8 oz (108.636 kg)  06/11/15 247 lb (112.038 kg)   Lab Results  Component Value Date   HGBA1C 7.4 07/25/2015   HGBA1C 7.6 05/18/2015   HGBA1C 7.9 02/16/2015   Lab Results  Component Value Date   MICROALBUR 63.2* 02/20/2015   LDLCALC 180* 02/20/2015   CREATININE 2.14* 07/11/2015     OTHER problems: Reviewed in ROS       Medication List       This list is accurate as of: 07/25/15  8:16 PM.  Always use your most recent med list.  amLODipine 5 MG tablet  Commonly known as:  NORVASC     aspirin 81 MG tablet  Take 81 mg by mouth every other day.     carvedilol 25 MG tablet  Commonly known as:  COREG  TAKE 1 AND 1/2 TABLETS BY MOUTH TWICE A DAY     diazepam 10 MG tablet  Commonly known as:  VALIUM  TAKE 1/2 TO 1 TABLET BY MOUTH AT BEDTIMEAS NEEDED FOR ANXIETY OR RINGING IN EARS     ferrous sulfate 325 (65 FE) MG tablet  Commonly known as:  CVS IRON  Take 1 tablet (325 mg total) by mouth daily with breakfast.     glucose blood test strip  Commonly known as:  ONETOUCH VERIO  Use as instructed to check blood sugar 3 times per day dx code E11.29     lisinopril 20 MG tablet  Commonly known as:  PRINIVIL,ZESTRIL  Take 1 tablet (20 mg total) by mouth daily.     metolazone 2.5 MG tablet  Commonly known as:  ZAROXOLYN  Take one tablet on 04/12/15 and a one tablet on 04/15/15- take this 30 minutes prior to your torsemide dose     nitroGLYCERIN 0.4 MG SL tablet  Commonly known  as:  NITROSTAT  Place 1 tablet (0.4 mg total) under the tongue every 5 (five) minutes as needed. For chest pain     NOVOLOG FLEXPEN 100 UNIT/ML FlexPen  Generic drug:  insulin aspart     NOVOLOG 100 UNIT/ML injection  Generic drug:  insulin aspart     ONETOUCH DELICA LANCETS FINE Misc  Use to check blood sugar 3 times per day dx code 250.42     Pen Needles 31G X 5 MM Misc  Inject 15 Units as directed 2 (two) times daily. Use pen needles for pt insulin pen. Dx E11.29     rosuvastatin 40 MG tablet  Commonly known as:  CRESTOR  Take 1 tablet (40 mg total) by mouth daily.     spironolactone 25 MG tablet  Commonly known as:  ALDACTONE  Take 0.5 tablets (12.5 mg total) by mouth daily.     torsemide 20 MG tablet  Commonly known as:  DEMADEX  Take 4 tabs every other day ALTERNATING with 3 tabs every other day     V-GO 20 Kit  Use 1 per day dx code E11.29        Allergies:  Allergies  Allergen Reactions  . Sulfa Antibiotics Swelling and Rash    Hands swell    Past Medical History  Diagnosis Date  . COPD (chronic obstructive pulmonary disease) ([place]) 1993  . Hyperlipidemia 1993  . Hypertension 1993  . CAD (coronary artery disease)     bypass graft surgery 05/2006; grafts were widely patent on cath 06/2010   . Ischemic cardiomyopathy     EF 25-30%; s/p single chamber ICD 2010; upgrade to Medtronic BiV ICD 01/30/12  . Systolic heart failure     class II  . Foot fracture 1953; 1962    left; left  . Chronic kidney disease   . Myocardial infarction [facility] 2008; 01/06/2012  . Syncope and collapse 01/05/2012  . Shortness of breath 01/05/2012    "all the time; been going on for awhile"  . Diabetes mellitus, type 2 ([place]) 2000  . Arthritis     "hands" (01/06/2012)  . Elbow mass     left; "just noticed it ~ 2 wk ago"  (01/06/2012)  . Anxiety   .  Nightmares     with psych eval 2012 [facility]  . PTSD (post-traumatic stress disorder)   . Tinnitus of left ear   . Deafness in left ear    . Boil     "fluid boil left arm" (01/30/2012)  . LBBB (left bundle branch block)     Past Surgical History  Procedure Laterality Date  . Coronary artery bypass graft  2008    3 vessel, class 4 CHF, non Q wave MI 05/19/06  . Appendectomy  1957  . Cardiac defibrillator placement  01/30/2012    biventricular ICD  . Fracture surgery  1953; ~ 1962    left; left  . Cardiac defibrillator placement  01/30/2012    biventricular ICD  . Insert / replace / remove pacemaker  01/30/2012    biventricular ICD placed  . Bi-ventricular implantable cardioverter defibrillator upgrade N/A 01/30/2012    Procedure: BI-VENTRICULAR IMPLANTABLE CARDIOVERTER DEFIBRILLATOR UPGRADE;  Surgeon: Evans Lance, MD;  Location: [facility] CATH LAB;  Service: Cardiovascular;  Laterality: N/A;    Family History  Problem Relation Age of Onset  . Heart disease Father     MI age 65  . Prostate cancer Father   . Diabetes Sister   . Hyperlipidemia Sister   . Cancer Maternal Grandmother     liver, cirrhosis  . Depression Neg Hx   . Alcohol abuse Neg Hx   . Drug abuse Neg Hx   . Stroke Neg Hx   . Colon cancer Neg Hx     Social History:  reports that he has quit smoking. His smoking use included Cigarettes. He has a 17.5 pack-year smoking history. He has never used smokeless tobacco. He reports that he drinks about 4.2 oz of alcohol per week. He reports that he does not use illicit drugs.  Review of Systems:  HYPERTENSION treated with Coreg and lisinopril 20 mg with Good control This is followed by PCP and cardiologist  HYPERLIPIDEMIA:   Has been managed by PCP and cardiologist  He has better compliance with his medications recently, now taking 40 mg Crestor even with his renal dysfunction  LDL below 100 but not below 70   Lab Results  Component Value Date   CHOL 241* 02/20/2015   HDL 34.00* 02/20/2015   LDLCALC 180* 02/20/2015   LDLDIRECT 94.0 07/31/2014   TRIG 135.0 02/20/2015   CHOLHDL 7 02/20/2015    CKD: His creatinine is  stable and followed by nephrologist   Lab Results  Component Value Date   CREATININE 2.14* 07/11/2015    Last foot exam in 4/16 showed variable monofilament sensation in the toes and plantar surfaces, some areas have decreased sensation especially plantar surfaces.  Has no skin lesions or ulcers on the feet and normal pedal pulses      Examination:   BP 122/60 mmHg  Pulse 68  Temp(Src) 97.8 F (36.6 C)  Resp 14  Ht '5\' 8"'$  (1.727 m)  Wt 241 lb 3.2 oz (109.408 kg)  BMI 36.68 kg/m2  SpO2 96%  Body mass index is 36.68 kg/(m^2).    ASSESSMENT/ PLAN:   Diabetes type 2 With BMI 37 See history of present illness for detailed discussion of current diabetes management, blood sugar patterns and problems identified Over the last 2 months his blood sugars are on average mildly increased and about 160 recently A1c is reasonably good at 7.4 for his age Although he is fairly compliant with his V-go pump usage and bolusing is not giving a  good history about whether he is bolusing right at breakfast or whether he is adjusting his boluses based on his meal intake  He has several questions about day-to-day management of his pump, cost of the pump and insulin and the boluses  Discussed that he should try to bolus only when he is ready for his meal and not bolus when he is not eating He can adjust to dose between 4-6 clicks based on how much he is planning to eat especially at suppertime He will stay on the 20 unit basal even though his fasting readings are relatively high as he is likely to get hypoglycemic with the 30 units pump  Counseling time on subjects discussed above is over 50% of today's 25 minute visit   Patient Instructions  Take 4-6 clicks before each meal based on how much you plan to eat  Change pump same time daily  If sugar is over 256 take extra click to bring sugar down  Instead of Novolog use Relion Regular insulin from [facility] 07/25/2015, 8:16 PM

## 2015-07-25 NOTE — Patient Instructions (Addendum)
Take 4-6 clicks before each meal based on how much you plan to eat  Change pump same time daily  If sugar is over 768 take extra click to bring sugar down  Instead of Novolog use Relion Regular insulin from [employer]

## 2015-07-30 ENCOUNTER — Other Ambulatory Visit: Payer: Self-pay | Admitting: Cardiology

## 2015-07-31 DIAGNOSIS — L570 Actinic keratosis: Secondary | ICD-10-CM | POA: Diagnosis not present

## 2015-07-31 DIAGNOSIS — L578 Other skin changes due to chronic exposure to nonionizing radiation: Secondary | ICD-10-CM | POA: Diagnosis not present

## 2015-07-31 DIAGNOSIS — L820 Inflamed seborrheic keratosis: Secondary | ICD-10-CM | POA: Diagnosis not present

## 2015-07-31 DIAGNOSIS — C44219 Basal cell carcinoma of skin of left ear and external auricular canal: Secondary | ICD-10-CM | POA: Diagnosis not present

## 2015-08-01 ENCOUNTER — Encounter: Payer: PPO | Attending: Endocrinology | Admitting: Nutrition

## 2015-08-01 DIAGNOSIS — E1165 Type 2 diabetes mellitus with hyperglycemia: Secondary | ICD-10-CM | POA: Diagnosis not present

## 2015-08-01 DIAGNOSIS — Z794 Long term (current) use of insulin: Secondary | ICD-10-CM

## 2015-08-02 ENCOUNTER — Ambulatory Visit (HOSPITAL_COMMUNITY)
Admission: RE | Admit: 2015-08-02 | Discharge: 2015-08-02 | Disposition: A | Discharge status: Home / Self Care | Payer: PPO | Source: Ambulatory Visit | Attending: Cardiology | Admitting: Cardiology

## 2015-08-02 ENCOUNTER — Encounter (HOSPITAL_COMMUNITY): Payer: Self-pay

## 2015-08-02 VITALS — BP 141/59 | HR 74 | Ht 68.0 in | Wt 235.1 lb

## 2015-08-02 DIAGNOSIS — I130 Hypertensive heart and chronic kidney disease with heart failure and stage 1 through stage 4 chronic kidney disease, or unspecified chronic kidney disease: Secondary | ICD-10-CM | POA: Insufficient documentation

## 2015-08-02 DIAGNOSIS — I255 Ischemic cardiomyopathy: Secondary | ICD-10-CM | POA: Diagnosis not present

## 2015-08-02 DIAGNOSIS — N183 Chronic kidney disease, stage 3 unspecified: Secondary | ICD-10-CM

## 2015-08-02 DIAGNOSIS — Z79899 Other long term (current) drug therapy: Secondary | ICD-10-CM | POA: Insufficient documentation

## 2015-08-02 DIAGNOSIS — E1122 Type 2 diabetes mellitus with diabetic chronic kidney disease: Secondary | ICD-10-CM | POA: Diagnosis not present

## 2015-08-02 DIAGNOSIS — J449 Chronic obstructive pulmonary disease, unspecified: Secondary | ICD-10-CM | POA: Insufficient documentation

## 2015-08-02 DIAGNOSIS — E669 Obesity, unspecified: Secondary | ICD-10-CM | POA: Diagnosis not present

## 2015-08-02 DIAGNOSIS — Z7982 Long term (current) use of aspirin: Secondary | ICD-10-CM | POA: Insufficient documentation

## 2015-08-02 DIAGNOSIS — Z951 Presence of aortocoronary bypass graft: Secondary | ICD-10-CM | POA: Insufficient documentation

## 2015-08-02 DIAGNOSIS — E785 Hyperlipidemia, unspecified: Secondary | ICD-10-CM | POA: Insufficient documentation

## 2015-08-02 DIAGNOSIS — Z6835 Body mass index (BMI) 35.0-35.9, adult: Secondary | ICD-10-CM | POA: Diagnosis not present

## 2015-08-02 DIAGNOSIS — I5022 Chronic systolic (congestive) heart failure: Secondary | ICD-10-CM

## 2015-08-02 DIAGNOSIS — I2510 Atherosclerotic heart disease of native coronary artery without angina pectoris: Secondary | ICD-10-CM | POA: Diagnosis not present

## 2015-08-02 DIAGNOSIS — Z87891 Personal history of nicotine dependence: Secondary | ICD-10-CM | POA: Diagnosis not present

## 2015-08-02 DIAGNOSIS — I739 Peripheral vascular disease, unspecified: Secondary | ICD-10-CM | POA: Diagnosis not present

## 2015-08-02 DIAGNOSIS — Z8249 Family history of ischemic heart disease and other diseases of the circulatory system: Secondary | ICD-10-CM | POA: Insufficient documentation

## 2015-08-02 DIAGNOSIS — F4310 Post-traumatic stress disorder, unspecified: Secondary | ICD-10-CM | POA: Diagnosis not present

## 2015-08-02 DIAGNOSIS — N189 Chronic kidney disease, unspecified: Secondary | ICD-10-CM | POA: Diagnosis not present

## 2015-08-02 DIAGNOSIS — N289 Disorder of kidney and ureter, unspecified: Secondary | ICD-10-CM

## 2015-08-02 DIAGNOSIS — Z794 Long term (current) use of insulin: Secondary | ICD-10-CM | POA: Diagnosis not present

## 2015-08-02 DIAGNOSIS — G4733 Obstructive sleep apnea (adult) (pediatric): Secondary | ICD-10-CM | POA: Diagnosis not present

## 2015-08-02 LAB — BASIC METABOLIC PANEL
Anion gap: 12 (ref 5–15)
BUN: 28 mg/dL — AB (ref 6–20)
CHLORIDE: 102 mmol/L (ref 101–111)
CO2: 26 mmol/L (ref 22–32)
Calcium: 9.3 mg/dL (ref 8.9–10.3)
Creatinine, Ser: 2.2 mg/dL — ABNORMAL HIGH (ref 0.61–1.24)
GFR calc Af Amer: 31 mL/min — ABNORMAL LOW (ref >60)
GFR calc non Af Amer: 27 mL/min — ABNORMAL LOW (ref >60)
GLUCOSE: 162 mg/dL — AB (ref 65–99)
POTASSIUM: 4 mmol/L (ref 3.5–5.1)
SODIUM: 140 mmol/L (ref 135–145)

## 2015-08-02 LAB — BRAIN NATRIURETIC PEPTIDE: B NATRIURETIC PEPTIDE 5: 396.2 pg/mL — AB (ref 0.0–100.0)

## 2015-08-02 MED ORDER — SACUBITRIL-VALSARTAN 24-26 MG PO TABS: 1.0000 | ORAL_TABLET | Freq: Two times a day (BID) | ORAL | Status: DC

## 2015-08-02 NOTE — Patient Instructions (Signed)
Stop Lisinopril  Start Entresto 24/26 mg Twice daily   Labs today  Labs in 10 days  Your physician recommends that you schedule a follow-up appointment in: 2 months

## 2015-08-02 NOTE — Progress Notes (Signed)
Patient ID: [name] [name], male   DOB: 07/13/1934, 81 y.o.   MRN: [id number]

## 2015-08-03 NOTE — Progress Notes (Signed)
Patient ID: [name] [name], male   DOB: 13-Aug-1934, 81 y.o.   MRN: [id number] PCP: Dr. Damita Dunnings  81 yo with history of CAD s/p CABG, ischemic cardiomyopathy/systolic CHF, LBBB, COPD, and CKD presents for cardiology followup.  He had CABG in 2008.  Last cath in 3/12 showed patent grafts.  EF was 20-25%.  He was admitted in 10/13 with acute on chronic systolic CHF.  He was volume overloaded. He had a mild TnI elevation with no chest pain.  He was diuresed and discharged.  After that, his ICD was upgraded to a Medtronic CRT-D system.  EF improved to 35-40% on echo in 10/14. EF remained 35-40% on 3/17 echo.   Today, weight is down 4 more lbs.  He is taking torsemide 40 mg daily.  Seems to be doing better.  Fatigues easily and not very active.  However, he can walk about 100 yards before becoming short of breath.  Uses walker or cane. No orthopnea/PND.  No chest pain. No claudication, no pedal ulcers.  Using CPAP.   Labs (10/13): K 3.6 => 4.2, creatinine 1.79 => 2.1 => 2.0, HDL 45, LDL 157, BNP 219 Labs (12/13): K 4, creatinine 1.9, BNP 209 Labs (1/14): LDL 61, HDL 46, LFTs normal Labs (2/14): BNP 199 Labs (3/14): K 4.1, creatinine 2.0 Labs (5/14): K 4.9, creatinine 3.0 Labs (8/14): K 4.2, creatinine 1.84 Labs (10/14): K 3.8, creatinine 1.7 Labs (2/15): K 3.6, creatinine 1.6, LDL 84, HDL 38 Labs (11/16): LDL 180, HDL 34 Labs (3/17): K 4.2 => 4, creatinine 1.63 => 2.16 => 2.6 Labs (4/17): K 4.3, creatinine 2.14  PMH: 1. CAD: S/p CABG 2008.  Cath 3/12 with totally occluded mLAD and RCA, 80% LCx stenosis, patent LIMA-LAD, patent SVG-OM, 40% stenosis in SVG-PDA.   2. Ischemic cardiomyopathy: Echo (10/13) with severely dilated LV, moderate LVH, EF 20-25%, mild MR.  Medtronic CRT-D device 10/13.  Echo (10/14) with severely dilated LV, mild LVH, EF 35-40% (improved).  - Echo (3/17) with EF 35-40%, moderate LVH, decreased RV systolic function.  3. LBBB 4. COPD 5. Type II diabetes 6. Hyperlipidemia 7.  HTN 8. CKD 9. Obesity 10. OA: knees. 11. PTSD 12. H/o ETOH abuse 13. OSA: Severe by sleep study, on CPAP.  14. PAD: ABIs (5/14) with 0.75 right, 0.80 left.   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   FH: Father with MI at 51  ROS: All systems reviewed and negative except as per HPI.   Current Outpatient Prescriptions  Medication Sig Dispense Refill  . amLODipine (NORVASC) 5 MG tablet TAKE 1 TABLET BY MOUTH ONCE A DAY 30 tablet 3  . aspirin 81 MG tablet Take 81 mg by mouth every other day.     . carvedilol (COREG) 25 MG tablet TAKE 1 AND 1/2 TABLETS BY MOUTH TWICE A DAY 90 tablet 3  . diazepam (VALIUM) 10 MG tablet TAKE 1/2 TO 1 TABLET BY MOUTH AT BEDTIMEAS NEEDED FOR ANXIETY OR RINGING IN EARS 30 tablet 1  . ferrous sulfate (CVS IRON) 325 (65 FE) MG tablet Take 1 tablet (325 mg total) by mouth daily with breakfast. 90 tablet 3  . glucose blood (ONETOUCH VERIO) test strip Use as instructed to check blood sugar 3 times per day dx code E11.29 100 each 3  . Insulin Disposable Pump (V-GO 20) KIT Use 1 per day dx code E11.29 1 kit 3  . Insulin Pen Needle (PEN NEEDLES) 31G X 5 MM MISC  Inject 15 Units as directed 2 (two) times daily. Use pen needles for pt insulin pen. Dx E11.29 100 each 5  . metolazone (ZAROXOLYN) 2.5 MG tablet Take one tablet on 04/12/15 and a one tablet on 04/15/15- take this 30 minutes prior to your torsemide dose 5 tablet 0  . nitroGLYCERIN (NITROSTAT) 0.4 MG SL tablet Place 1 tablet (0.4 mg total) under the tongue every 5 (five) minutes as needed. For chest pain 25 tablet 11  . NOVOLOG 100 UNIT/ML injection     . NOVOLOG FLEXPEN 100 UNIT/ML FlexPen     . ONETOUCH DELICA LANCETS FINE MISC Use to check blood sugar 3 times per day dx code 250.42 100 each 3  . rosuvastatin (CRESTOR) 40 MG tablet Take 1 tablet (40 mg total) by mouth daily. 90 tablet 3  . spironolactone (ALDACTONE) 25 MG tablet Take 0.5 tablets (12.5 mg  total) by mouth daily. 45 tablet 3  . torsemide (DEMADEX) 20 MG tablet Take 40 mg by mouth daily.    . sacubitril-valsartan (ENTRESTO) 24-26 MG Take 1 tablet by mouth 2 (two) times daily. 60 tablet 3   No current facility-administered medications for this encounter.    BP 141/59 mmHg  Pulse 74  Ht '5\' 8"'$  (1.727 m)  Wt 235 lb 1.9 oz (106.65 kg)  BMI 35.76 kg/m2  SpO2 97% General: NAD, obese Neck: Thick, JVP 7 cm, no thyromegaly or thyroid nodule.  Lungs: Clear to auscultation bilaterally with normal respiratory effort. CV: Nondisplaced PMI.  Heart regular S1/S2, no S3/S4, no murmur.  Trace ankle edema.  No carotid bruit.  Difficult to palpate PT pulses.   Abdomen: Soft, nontender, no hepatosplenomegaly, no distention.  Neurologic: Alert and oriented x 3.  Psych: Normal affect. Extremities: No clubbing or cyanosis.   Assessment/Plan: 1. Chronic systolic CHF: EF 73-42% by last echo in 3/17.  Status post Medtronic CRT-D upgrade. NYHA class II-III symptoms.  Weight is down 4 lbs and volume status looks better.    - Continue torsemide 40 daily, check BMET/BNP today.   - Continue Coreg and spironolactone. - BP stable and creatinine lower.  I will have him stop lisinopril, and after 36 hours, start Entresto 24/26 bid. BMET 10 days.  2. CAD: s/p CABG.  No recent chest pain.  Last cath in 3/12 with patent grafts.  - Continue ASA 81. - He is back on Crestor.     3. Hyperlipidemia: Last LDL very high, he has restarted Crestor 40.  Will check lipids/LFTs in 6/17.  4. OSA: Severe OSA, on CPAP.  5. CKD: BMET today and in 10 days.  6. PAD: Claudication improved, no pedal ulcerations or rest pain.  Not candidate for cilostazol given CHF.     Followup in 2 months.     [name] 08/03/2015

## 2015-08-07 NOTE — Progress Notes (Signed)
Patient and his wife had questions about the amount of insulin in the V-Go.  "Some days I am not able to punch the [facility] buttons, even though I see insulin in the viewing window, and some days I wear it longer than 24 hours but blood sugars go high like it is not working".   I explained that the V-go insulin hold only 20u for basal insulin that gets secreted over 24 hours, and that that insulin runs out after 24 hours.  This is why you must change it after 24 hours.  I also explained that the other compartment holds an additional 36 units to give for meal time insulin.  If That is used up, the V-go continues to deliver insulin for 24 hours.  I suggested that if they need more meal time insulin coverage, that he can give a syringe with an insulin dose.  He reported good understanding of this.   They both reported good understanding of this, and said that it explained all of there concerns.  They had no final questions.

## 2015-08-13 ENCOUNTER — Ambulatory Visit (HOSPITAL_COMMUNITY)
Admission: RE | Admit: 2015-08-13 | Discharge: 2015-08-13 | Disposition: A | Discharge status: Home / Self Care | Payer: PPO | Source: Ambulatory Visit | Attending: Internal Medicine | Admitting: Internal Medicine

## 2015-08-13 DIAGNOSIS — I5022 Chronic systolic (congestive) heart failure: Secondary | ICD-10-CM | POA: Diagnosis not present

## 2015-08-13 LAB — BASIC METABOLIC PANEL
Anion gap: 12 (ref 5–15)
BUN: 31 mg/dL — AB (ref 6–20)
CHLORIDE: 100 mmol/L — AB (ref 101–111)
CO2: 26 mmol/L (ref 22–32)
CREATININE: 2.08 mg/dL — AB (ref 0.61–1.24)
Calcium: 9 mg/dL (ref 8.9–10.3)
GFR calc Af Amer: 33 mL/min — ABNORMAL LOW (ref >60)
GFR calc non Af Amer: 29 mL/min — ABNORMAL LOW (ref >60)
GLUCOSE: 212 mg/dL — AB (ref 65–99)
Potassium: 4.1 mmol/L (ref 3.5–5.1)
SODIUM: 138 mmol/L (ref 135–145)

## 2015-08-17 ENCOUNTER — Other Ambulatory Visit: Payer: Self-pay

## 2015-08-17 MED ORDER — GLUCOSE BLOOD VI STRP
ORAL_STRIP | Status: DC
Start: 2015-08-17 — End: 2016-05-05

## 2015-08-17 NOTE — Telephone Encounter (Signed)
Rx sent electronically.  

## 2015-08-20 ENCOUNTER — Other Ambulatory Visit (HOSPITAL_COMMUNITY): Payer: PPO

## 2015-09-04 ENCOUNTER — Telehealth: Payer: Self-pay

## 2015-09-04 NOTE — Telephone Encounter (Signed)
Entresto approved through 04/06/2016. Local pharmacy notified.

## 2015-09-04 NOTE — Telephone Encounter (Signed)
Prior auth for [employer] 24-26 sent to [employer].

## 2015-09-11 ENCOUNTER — Other Ambulatory Visit: Payer: Self-pay | Admitting: Family Medicine

## 2015-09-11 NOTE — Telephone Encounter (Signed)
Electronic refill request. Last Filled:    90 tablet 3 04/30/2015  Shouldn't there be refills remaining?  Please advise.

## 2015-09-12 NOTE — Telephone Encounter (Signed)
No.  90 = 1 month.  Should be running low.  Sent.  Thanks.

## 2015-09-13 ENCOUNTER — Telehealth: Payer: Self-pay | Admitting: Family Medicine

## 2015-09-13 NOTE — Telephone Encounter (Signed)
Pt dropped off DMV form for placard renewal. Please call [phone] when completed. I placed form in Rx tower.

## 2015-09-13 NOTE — Telephone Encounter (Signed)
Placed in Dr. Duncan's In Box. 

## 2015-09-14 NOTE — Telephone Encounter (Signed)
I'll work on the hard copy.  Thanks.  

## 2015-10-05 ENCOUNTER — Ambulatory Visit (HOSPITAL_COMMUNITY)
Admission: RE | Admit: 2015-10-05 | Discharge: 2015-10-05 | Disposition: A | Discharge status: Home / Self Care | Payer: PPO | Source: Ambulatory Visit | Attending: Cardiology | Admitting: Cardiology

## 2015-10-05 ENCOUNTER — Encounter (HOSPITAL_COMMUNITY): Payer: Self-pay

## 2015-10-05 VITALS — BP 151/66 | HR 70 | Ht 68.0 in | Wt 241.1 lb

## 2015-10-05 DIAGNOSIS — Z95810 Presence of automatic (implantable) cardiac defibrillator: Secondary | ICD-10-CM | POA: Insufficient documentation

## 2015-10-05 DIAGNOSIS — I5022 Chronic systolic (congestive) heart failure: Secondary | ICD-10-CM | POA: Diagnosis not present

## 2015-10-05 DIAGNOSIS — Z794 Long term (current) use of insulin: Secondary | ICD-10-CM | POA: Diagnosis not present

## 2015-10-05 DIAGNOSIS — Z6836 Body mass index (BMI) 36.0-36.9, adult: Secondary | ICD-10-CM | POA: Insufficient documentation

## 2015-10-05 DIAGNOSIS — J449 Chronic obstructive pulmonary disease, unspecified: Secondary | ICD-10-CM | POA: Diagnosis not present

## 2015-10-05 DIAGNOSIS — Z7982 Long term (current) use of aspirin: Secondary | ICD-10-CM | POA: Diagnosis not present

## 2015-10-05 DIAGNOSIS — I739 Peripheral vascular disease, unspecified: Secondary | ICD-10-CM | POA: Diagnosis not present

## 2015-10-05 DIAGNOSIS — E669 Obesity, unspecified: Secondary | ICD-10-CM | POA: Diagnosis not present

## 2015-10-05 DIAGNOSIS — Z8249 Family history of ischemic heart disease and other diseases of the circulatory system: Secondary | ICD-10-CM | POA: Diagnosis not present

## 2015-10-05 DIAGNOSIS — I255 Ischemic cardiomyopathy: Secondary | ICD-10-CM | POA: Insufficient documentation

## 2015-10-05 DIAGNOSIS — I10 Essential (primary) hypertension: Secondary | ICD-10-CM

## 2015-10-05 DIAGNOSIS — I130 Hypertensive heart and chronic kidney disease with heart failure and stage 1 through stage 4 chronic kidney disease, or unspecified chronic kidney disease: Secondary | ICD-10-CM | POA: Insufficient documentation

## 2015-10-05 DIAGNOSIS — N189 Chronic kidney disease, unspecified: Secondary | ICD-10-CM | POA: Diagnosis not present

## 2015-10-05 DIAGNOSIS — E1122 Type 2 diabetes mellitus with diabetic chronic kidney disease: Secondary | ICD-10-CM | POA: Diagnosis not present

## 2015-10-05 DIAGNOSIS — E785 Hyperlipidemia, unspecified: Secondary | ICD-10-CM | POA: Insufficient documentation

## 2015-10-05 DIAGNOSIS — Z87891 Personal history of nicotine dependence: Secondary | ICD-10-CM | POA: Insufficient documentation

## 2015-10-05 DIAGNOSIS — G4733 Obstructive sleep apnea (adult) (pediatric): Secondary | ICD-10-CM | POA: Insufficient documentation

## 2015-10-05 DIAGNOSIS — I2510 Atherosclerotic heart disease of native coronary artery without angina pectoris: Secondary | ICD-10-CM | POA: Insufficient documentation

## 2015-10-05 DIAGNOSIS — Z79899 Other long term (current) drug therapy: Secondary | ICD-10-CM | POA: Diagnosis not present

## 2015-10-05 DIAGNOSIS — Z951 Presence of aortocoronary bypass graft: Secondary | ICD-10-CM | POA: Insufficient documentation

## 2015-10-05 LAB — BASIC METABOLIC PANEL
Anion gap: 9 (ref 5–15)
BUN: 35 mg/dL — AB (ref 6–20)
CHLORIDE: 103 mmol/L (ref 101–111)
CO2: 24 mmol/L (ref 22–32)
Calcium: 9.7 mg/dL (ref 8.9–10.3)
Creatinine, Ser: 1.81 mg/dL — ABNORMAL HIGH (ref 0.61–1.24)
GFR calc Af Amer: 40 mL/min — ABNORMAL LOW (ref >60)
GFR calc non Af Amer: 34 mL/min — ABNORMAL LOW (ref >60)
GLUCOSE: 119 mg/dL — AB (ref 65–99)
POTASSIUM: 4.7 mmol/L (ref 3.5–5.1)
Sodium: 136 mmol/L (ref 135–145)

## 2015-10-05 LAB — HEPATIC FUNCTION PANEL
ALBUMIN: 3.3 g/dL — AB (ref 3.5–5.0)
ALK PHOS: 51 U/L (ref 38–126)
ALT: 17 U/L (ref 17–63)
AST: 16 U/L (ref 15–41)
Bilirubin, Direct: <0.1 mg/dL — ABNORMAL LOW (ref 0.1–0.5)
TOTAL PROTEIN: 6.8 g/dL (ref 6.5–8.1)
Total Bilirubin: 0.4 mg/dL (ref 0.3–1.2)

## 2015-10-05 LAB — LIPID PANEL
CHOLESTEROL: 120 mg/dL (ref 0–200)
HDL: 40 mg/dL — ABNORMAL LOW (ref >40)
LDL Cholesterol: 60 mg/dL (ref 0–99)
Total CHOL/HDL Ratio: 3 RATIO
Triglycerides: 102 mg/dL (ref <150)
VLDL: 20 mg/dL (ref 0–40)

## 2015-10-05 MED ORDER — AMLODIPINE BESYLATE 10 MG PO TABS: 10.0000 mg | ORAL_TABLET | Freq: Every day | ORAL | Status: DC

## 2015-10-05 NOTE — Patient Instructions (Signed)
Routine lab work today. (lipids, lfts, bmet) Will notify you of abnormal results  Increase Amlodipine to '10mg'$  daily.  Follow up with Dr.McLean in 10month

## 2015-10-05 NOTE — Progress Notes (Signed)
Patient ID: [name] [name], male   DOB: 07/11/1934, 81 y.o.   MRN: [id number] PCP: Dr. Damita Dunnings HF MD: Dr Aundra Dubin  81 yo with history of CAD s/p CABG, ischemic cardiomyopathy/systolic CHF, LBBB, COPD, and CKD presents for cardiology followup.  He had CABG in 2008.  Last cath in 3/12 showed patent grafts.  EF was 20-25%.  He was admitted in 10/13 with acute on chronic systolic CHF.  He was volume overloaded. He had a mild TnI elevation with no chest pain.  He was diuresed and discharged.  After that, his ICD was upgraded to a Medtronic CRT-D system.  EF improved to 35-40% on echo in 10/14. EF remained 35-40% on 3/17 echo.   Today he returns for HF follow. Last visit ace was stopped and entresto was started. Denies SOB/PND/ orthopnea. Weight at home in the 230. Limited activity due to knee pain.  Uses walker or cane. No orthopnea/PND.  No chest pain. No claudication, no pedal ulcers.  Not using CPAP. Taking all medications.  Optivol: Fluid well below threshold. Activity , 1 hour per day   Labs (10/13): K 3.6 => 4.2, creatinine 1.79 => 2.1 => 2.0, HDL 45, LDL 157, BNP 219 Labs (12/13): K 4, creatinine 1.9, BNP 209 Labs (1/14): LDL 61, HDL 46, LFTs normal Labs (2/14): BNP 199 Labs (3/14): K 4.1, creatinine 2.0 Labs (5/14): K 4.9, creatinine 3.0 Labs (8/14): K 4.2, creatinine 1.84 Labs (10/14): K 3.8, creatinine 1.7 Labs (2/15): K 3.6, creatinine 1.6, LDL 84, HDL 38 Labs (11/16): LDL 180, HDL 34 Labs (3/17): K 4.2 => 4, creatinine 1.63 => 2.16 => 2.6 Labs (4/17): K 4.3, creatinine 2.14 Labs (08/2015) : K 4.1 Creatinine 2.08  PMH: 1. CAD: S/p CABG 2008.  Cath 3/12 with totally occluded mLAD and RCA, 80% LCx stenosis, patent LIMA-LAD, patent SVG-OM, 40% stenosis in SVG-PDA.   2. Ischemic cardiomyopathy: Echo (10/13) with severely dilated LV, moderate LVH, EF 20-25%, mild MR.  Medtronic CRT-D device 10/13.  Echo (10/14) with severely dilated LV, mild LVH, EF 35-40% (improved).  - Echo (3/17) with EF  35-40%, moderate LVH, decreased RV systolic function.  3. LBBB 4. COPD 5. Type II diabetes 6. Hyperlipidemia 7. HTN 8. CKD 9. Obesity 10. OA: knees. 11. PTSD 12. H/o ETOH abuse 13. OSA: Severe by sleep study, on CPAP.  14. PAD: ABIs (5/14) with 0.75 right, 0.80 left.   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   FH: Father with MI at 8  ROS: All systems reviewed and negative except as per HPI.   Current Outpatient Prescriptions  Medication Sig Dispense Refill  . amLODipine (NORVASC) 5 MG tablet TAKE 1 TABLET BY MOUTH ONCE A DAY 30 tablet 3  . aspirin 81 MG tablet Take 81 mg by mouth every other day.     . carvedilol (COREG) 25 MG tablet TAKE 1 & 1/2 TABLETS BY MOUTH TWICE A DAY 90 tablet 5  . diazepam (VALIUM) 10 MG tablet TAKE 1/2 TO 1 TABLET BY MOUTH AT BEDTIMEAS NEEDED FOR ANXIETY OR RINGING IN EARS 30 tablet 1  . ferrous sulfate (CVS IRON) 325 (65 FE) MG tablet Take 1 tablet (325 mg total) by mouth daily with breakfast. 90 tablet 3  . glucose blood (ONETOUCH VERIO) test strip Use as instructed to check blood sugar 3 times per day dx code E11.29 100 each 3  . Insulin Disposable Pump (V-GO 20) KIT Use 1 per day dx code  E11.29 1 kit 3  . Insulin Pen Needle (PEN NEEDLES) 31G X 5 MM MISC Inject 15 Units as directed 2 (two) times daily. Use pen needles for pt insulin pen. Dx E11.29 100 each 5  . nitroGLYCERIN (NITROSTAT) 0.4 MG SL tablet Place 1 tablet (0.4 mg total) under the tongue every 5 (five) minutes as needed. For chest pain 25 tablet 11  . ONETOUCH DELICA LANCETS FINE MISC Use to check blood sugar 3 times per day dx code 250.42 100 each 3  . rosuvastatin (CRESTOR) 40 MG tablet Take 1 tablet (40 mg total) by mouth daily. 90 tablet 3  . sacubitril-valsartan (ENTRESTO) 24-26 MG Take 1 tablet by mouth 2 (two) times daily. 60 tablet 3  . spironolactone (ALDACTONE) 25 MG tablet Take 0.5 tablets (12.5 mg total) by mouth daily.  45 tablet 3  . torsemide (DEMADEX) 20 MG tablet Take 40 mg by mouth daily.    Marland Kitchen NOVOLOG FLEXPEN 100 UNIT/ML FlexPen Reported on 10/05/2015     No current facility-administered medications for this encounter.    BP 151/66 mmHg  Pulse 70  Ht 5' 8" (1.727 m)  Wt 241 lb 1.9 oz (109.371 kg)  BMI 36.67 kg/m2  SpO2 99% General: NAD, obese Neck: Thick, JVP 5-6  cm, no thyromegaly or thyroid nodule.  Lungs: Clear to auscultation bilaterally with normal respiratory effort. CV: Nondisplaced PMI.  Heart regular S1/S2, no S3/S4, no murmur.  Trace ankle edema.  No carotid bruit.  Difficult to palpate PT pulses.   Abdomen: Soft, nontender, no hepatosplenomegaly, no distention.  Neurologic: Alert and oriented x 3.  Psych: Normal affect. Extremities: No clubbing or cyanosis.   Assessment/Plan: 1. Chronic systolic CHF: EF 62-83% by last echo in 3/17.  Status post Medtronic CRT-D upgrade. NYHA class II-III symptoms but remains inactive.  Volume status stable and verified by Optivol.   - Continue torsemide 40 daily.   - Continue Coreg and spironolactone. - Would like to see him more active.  Will refer to cardiac rehab at [facility].  If he does not do this, would consider walking in the water at the [facility] pool. 2. CAD: s/p CABG.  No recent chest pain.  Last cath in 3/12 with patent grafts.  - Continue ASA 81. - He is back on Crestor.     3. Hyperlipidemia: Last LDL very high, he has restarted Crestor 40.  Will check lipids/LFTs today. 4. OSA: Severe OSA, on CPAP.  5. CKD: BMET today  6. PAD: Claudication improved, no pedal ulcerations or rest pain.  Not candidate for cilostazol given CHF.   7. HTN: BP high, has not been taking amlodipine.  Will have him start 5 mg daily.    Follow up in 3 months.     Amy Clegg Np-C  10/05/2015   Patient seen with NP, agree with the above note.  He is generally stable except BP is running high.  He has not been taking amlodipine 5 mg daily so will have him start this.  I  am not going to increase Entresto given creatinine 2.08.  Would like to see him more active but limited by knee pain.  Would have him either go to cardiac rehab at [facility] or try walking in a pool at the [facility].   [name]  10/07/2015

## 2015-10-17 DIAGNOSIS — R0989 Other specified symptoms and signs involving the circulatory and respiratory systems: Secondary | ICD-10-CM

## 2015-10-18 ENCOUNTER — Encounter: Payer: Self-pay | Admitting: *Deleted

## 2015-10-19 ENCOUNTER — Other Ambulatory Visit (INDEPENDENT_AMBULATORY_CARE_PROVIDER_SITE_OTHER): Payer: PPO

## 2015-10-19 DIAGNOSIS — Z794 Long term (current) use of insulin: Secondary | ICD-10-CM | POA: Diagnosis not present

## 2015-10-19 DIAGNOSIS — E1165 Type 2 diabetes mellitus with hyperglycemia: Secondary | ICD-10-CM

## 2015-10-19 LAB — COMPREHENSIVE METABOLIC PANEL
ALBUMIN: 3.8 g/dL (ref 3.5–5.2)
ALT: 20 U/L (ref 0–53)
AST: 17 U/L (ref 0–37)
Alkaline Phosphatase: 53 U/L (ref 39–117)
BILIRUBIN TOTAL: 0.3 mg/dL (ref 0.2–1.2)
BUN: 49 mg/dL — AB (ref 6–23)
CALCIUM: 9.5 mg/dL (ref 8.4–10.5)
CO2: 25 meq/L (ref 19–32)
CREATININE: 1.9 mg/dL — AB (ref 0.40–1.50)
Chloride: 102 mEq/L (ref 96–112)
GFR: 36.54 mL/min — ABNORMAL LOW (ref >60.00)
Glucose, Bld: 150 mg/dL — ABNORMAL HIGH (ref 70–99)
Potassium: 4.5 mEq/L (ref 3.5–5.1)
SODIUM: 137 meq/L (ref 135–145)
Total Protein: 7.1 g/dL (ref 6.0–8.3)

## 2015-10-19 LAB — HEMOGLOBIN A1C: Hgb A1c MFr Bld: 7.6 % — ABNORMAL HIGH (ref 4.6–6.5)

## 2015-10-23 ENCOUNTER — Encounter: Payer: Self-pay | Admitting: Internal Medicine

## 2015-10-24 ENCOUNTER — Ambulatory Visit (INDEPENDENT_AMBULATORY_CARE_PROVIDER_SITE_OTHER): Payer: PPO | Admitting: Endocrinology

## 2015-10-24 ENCOUNTER — Other Ambulatory Visit: Payer: Self-pay | Admitting: Endocrinology

## 2015-10-24 ENCOUNTER — Encounter: Payer: Self-pay | Admitting: Endocrinology

## 2015-10-24 VITALS — BP 128/60 | HR 72 | Ht 68.0 in | Wt 246.0 lb

## 2015-10-24 DIAGNOSIS — Z794 Long term (current) use of insulin: Secondary | ICD-10-CM | POA: Diagnosis not present

## 2015-10-24 DIAGNOSIS — E1142 Type 2 diabetes mellitus with diabetic polyneuropathy: Secondary | ICD-10-CM

## 2015-10-24 DIAGNOSIS — E1165 Type 2 diabetes mellitus with hyperglycemia: Secondary | ICD-10-CM | POA: Diagnosis not present

## 2015-10-24 DIAGNOSIS — N183 Chronic kidney disease, stage 3 unspecified: Secondary | ICD-10-CM

## 2015-10-24 MED ORDER — VICTOZA 18 MG/3ML ~~LOC~~ SOPN: 1.2000 mg | PEN_INJECTOR | Freq: Every day | SUBCUTANEOUS | Status: AC

## 2015-10-24 NOTE — Patient Instructions (Addendum)
Victoza 0.6 mg for 1 week then 1.2 mg daily  Do 4-6 clicks at meals based on amount of Carbs or size of meal  If eating cereal add cheese for protein and take 5 clicks

## 2015-10-24 NOTE — Progress Notes (Signed)
Patient ID: Talton Delpriore, male   DOB: July 31, 1934, 81 y.o.   MRN: [id number]   Reason for Appointment:  follow-up   History of Present Illness   Diagnosis: Type 2 DIABETES MELITUS, date of diagnosis: 2002   He has been on insulin since about 2008 with variable control. In 04/2012 his A1c was 10.1% His diabetes control was significantly better with adding Victoza in early 2014 and adjusting his dose of Levemir Also was able to take lower doses of Levemir than before  Recent history:   Insulin regimen:  V-go 20 units basal, boluses 8-12 units before each meal, 1-3 meals daily  He has no difficulty using the V-go pump and bolusing at mealtimes He says he is changing at every 24 hours 10 am His A1c is relatively higher at 7.6, previously 7.4  Current management, blood sugar patterns and problems identified:  His glucose readings are on an average about the same at home  However he appears to be having relatively higher fasting readings on some days, checking somewhat infrequently  Highest blood sugars appear to be after breakfast but not consistently.  Today after eating a bagel and eggs his glucose was 117 reportedly with only 8 units bolus  Not clear if he is adjusting his boluses for meals or how he is doing it.  He thinks that occasionally with 12 units he will get hypoglycemic but not clear what type of meals caused this  Blood sugars are generally better in the afternoon and before supper  He has gained weight again   Oral hypoglycemic drugs: None         Side effects from medications: None Proper timing of medications in relation to meals:  only if eating at home .         Monitors blood glucose: Once a day or less .    Glucometer: One Touch  Mean values apply above for all meters except median for One Touch  PRE-MEAL Fasting Lunch Dinner Bedtime Overall  Glucose range: 118-200       Mean/median:     160+/-53    POST-MEAL PC Breakfast PC Lunch PC Dinner    Glucose range: 117-294  66-203  146-198   Mean/median:        Meals: 2-3 meals per day.eating breakfast at 9-10 am with either oatmeal/egg or biscuit, Sometimes cereal; lunch 2-3 PM dinner 7 pm.  May skip 1 or 2 meals a day                  Physical activity: exercise:  Minimal.  He says his legs hurt on walking           Dietician visit: Most recent: 2010             Wt Readings from Last 3 Encounters:  10/24/15 246 lb (111.585 kg)  10/05/15 241 lb 1.9 oz (109.371 kg)  08/02/15 235 lb 1.9 oz (106.65 kg)   Lab Results  Component Value Date   HGBA1C 7.6* 10/19/2015   HGBA1C 7.4 07/25/2015   HGBA1C 7.6 05/18/2015   Lab Results  Component Value Date   MICROALBUR 63.2* 02/20/2015   LDLCALC 60 10/05/2015   CREATININE 1.90* 10/19/2015     OTHER problems: Reviewed in ROS       Medication List       This list is accurate as of: 10/24/15  4:19 PM.  Always use your most recent med list.  amLODipine 10 MG tablet  Commonly known as:  NORVASC  Take 1 tablet (10 mg total) by mouth daily.     aspirin 81 MG tablet  Take 81 mg by mouth every other day.     carvedilol 25 MG tablet  Commonly known as:  COREG  TAKE 1 & 1/2 TABLETS BY MOUTH TWICE A DAY     diazepam 10 MG tablet  Commonly known as:  VALIUM  TAKE 1/2 TO 1 TABLET BY MOUTH AT BEDTIMEAS NEEDED FOR ANXIETY OR RINGING IN EARS     ferrous sulfate 325 (65 FE) MG tablet  Commonly known as:  CVS IRON  Take 1 tablet (325 mg total) by mouth daily with breakfast.     glucose blood test strip  Commonly known as:  ONETOUCH VERIO  Use as instructed to check blood sugar 3 times per day dx code E11.29     nitroGLYCERIN 0.4 MG SL tablet  Commonly known as:  NITROSTAT  Place 1 tablet (0.4 mg total) under the tongue every 5 (five) minutes as needed. For chest pain     NOVOLOG FLEXPEN 100 UNIT/ML FlexPen  Generic drug:  insulin aspart  Reported on 0/62/6948     [facility] DELICA LANCETS FINE Misc  Use to  check blood sugar 3 times per day dx code 250.42     Pen Needles 31G X 5 MM Misc  Inject 15 Units as directed 2 (two) times daily. Use pen needles for pt insulin pen. Dx E11.29     rosuvastatin 40 MG tablet  Commonly known as:  CRESTOR  Take 1 tablet (40 mg total) by mouth daily.     sacubitril-valsartan 24-26 MG  Commonly known as:  ENTRESTO  Take 1 tablet by mouth 2 (two) times daily.     spironolactone 25 MG tablet  Commonly known as:  ALDACTONE  Take 0.5 tablets (12.5 mg total) by mouth daily.     torsemide 20 MG tablet  Commonly known as:  DEMADEX  Take 40 mg by mouth daily.     V-GO 20 Kit  Use 1 per day dx code E11.29     VICTOZA 18 MG/3ML Sopn  Generic drug:  Liraglutide  Inject 0.2 mLs (1.2 mg total) into the skin daily. Inject once daily at the same time        Allergies:  Allergies  Allergen Reactions  . Sulfa Antibiotics Swelling and Rash    Hands swell    Past Medical History  Diagnosis Date  . COPD (chronic obstructive pulmonary disease) ([place]) 1993  . Hyperlipidemia 1993  . Hypertension 1993  . CAD (coronary artery disease)     bypass graft surgery 05/2006; grafts were widely patent on cath 06/2010   . Ischemic cardiomyopathy     EF 25-30%; s/p single chamber ICD 2010; upgrade to Medtronic BiV ICD 01/30/12  . Systolic heart failure     class II  . Foot fracture 1953; 1962    left; left  . Chronic kidney disease   . Myocardial infarction [facility]) 2008; 01/06/2012  . Syncope and collapse 01/05/2012  . Shortness of breath 01/05/2012    "all the time; been going on for awhile"  . Diabetes mellitus, type 2 ([place]) 2000  . Arthritis     "hands" (01/06/2012)  . Elbow mass     left; "just noticed it ~ 2 wk ago"  (01/06/2012)  . Anxiety   . Nightmares     with psych eval 2012 at  [facility]  . PTSD (post-traumatic stress disorder)   . Tinnitus of left ear   . 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 Boil     "fluid boil left arm" (01/30/2012)  . LBBB (left bundle branch block)      Past Surgical History  Procedure Laterality Date  . Coronary artery bypass graft  2008    3 vessel, class 4 CHF, non Q wave MI 05/19/06  . Appendectomy  1957  . Cardiac defibrillator placement  01/30/2012    biventricular ICD  . Fracture surgery  1953; ~ 1962    left; left  . Cardiac defibrillator placement  01/30/2012    biventricular ICD  . Insert / replace / remove pacemaker  01/30/2012    biventricular ICD placed  . Bi-ventricular implantable cardioverter defibrillator upgrade N/A 01/30/2012    Procedure: BI-VENTRICULAR IMPLANTABLE CARDIOVERTER DEFIBRILLATOR UPGRADE;  Surgeon: Evans Lance, MD;  Location: [facility] CATH LAB;  Service: Cardiovascular;  Laterality: N/A;    Family History  Problem Relation Age of Onset  . Heart disease Father     MI age 79  . Prostate cancer Father   . Diabetes Sister   . Hyperlipidemia Sister   . Cancer Maternal Grandmother     liver, cirrhosis  . Depression Neg Hx   . Alcohol abuse Neg Hx   . Drug abuse Neg Hx   . Stroke Neg Hx   . Colon cancer Neg Hx     Social History:  reports that he has quit smoking. His smoking use included Cigarettes. He has a 17.5 pack-year smoking history. He has never used smokeless tobacco. He reports that he drinks about 4.2 oz of alcohol per week. He reports that he does not use illicit drugs.  Review of Systems:  He does not know why he is getting weight.  He thinks he is eating small portions.  He thinks weight gain started after he cut back on his alcohol intake  HYPERTENSION treated with Coreg, Norvasc and lisinopril 20 mg with Good control This is followed by PCP and cardiologist He is now taking 10 mg Norvasc, he thinks he was taking 5 mg previously  HYPERLIPIDEMIA:   Has been managed by PCP and cardiologist  He has better compliance with his medications recently, now taking 40 mg Crestor even with his renal dysfunction  LDL below 70 recently   Lab Results  Component Value Date   CHOL 120  10/05/2015   HDL 40* 10/05/2015   LDLCALC 60 10/05/2015   LDLDIRECT 94.0 07/31/2014   TRIG 102 10/05/2015   CHOLHDL 3.0 10/05/2015   CKD: His creatinine is  stable and followed by nephrologist   Lab Results  Component Value Date   CREATININE 1.90* 10/19/2015    Last foot exam in 7/17 Some decreased monofilament sensation on the right and pedal pulses not palpable  He may get occasional tingling in his right leg     Examination:   BP 128/60 mmHg  Pulse 72  Ht '5\' 8"'$  (1.727 m)  Wt 246 lb (111.585 kg)  BMI 37.41 kg/m2  SpO2 95%  Body mass index is 37.41 kg/(m^2).   Diabetic Foot Exam - Simple   Simple Foot Form  Diabetic Foot exam was performed with the following findings:  Yes   Visual Inspection  No deformities, no ulcerations, no other skin breakdown bilaterally:  Yes  Sensation Testing  See comments:  Yes  Pulse Check  See comments:  Yes  Comments  Decreased  monofilament sensation distally on the right toes and distal plantar surfaces.  Absent pedal pulses bilaterally      ASSESSMENT/ PLAN:   Diabetes type 2 With BMI 37 See history of present illness for detailed discussion of current diabetes management, blood sugar patterns and problems identified  Overall he has done well with the V-go pump requiring overall smaller doses of insulin Although his A1c is not very high at 7.6 his blood sugars are fluctuating and tend to be higher in the mornings before and after breakfast He has difficulty knowing how much insulin to give for his meals with occasional hypoglycemia also in the afternoon  Also is gaining weight, may have benefited from combination with Victoza previously which was stopped because of cost  Discussed adjusting his boluses based on meal size and number of starches. He may have a higher glucose after breakfast be eating cereal or a higher fat meal Also discussed cutting back on boluses at lunch and supper if eating smaller amounts of carbohydrate or  smaller meal size  Since he thinks he is not getting some patient assistance he should be able to get Victoza covered better Discussed how to inject this and dosage increase after one week Will review blood sugars about a month but asked them to call if he has any low sugars before dinner  Given him information on foot care  Counseling time on subjects discussed above is over 50% of today's 25 minute visit   Patient Instructions  Victoza 0.6 mg for 1 week then 1.2 mg daily  Do 4-6 clicks at meals based on amount of Carbs or size of meal  If eating cereal add cheese for protein and take 5 clicks         Clayson Riling 10/24/2015, 4:19 PM

## 2015-10-30 DIAGNOSIS — H18411 Arcus senilis, right eye: Secondary | ICD-10-CM | POA: Diagnosis not present

## 2015-10-30 DIAGNOSIS — H2512 Age-related nuclear cataract, left eye: Secondary | ICD-10-CM | POA: Diagnosis not present

## 2015-10-30 DIAGNOSIS — H2513 Age-related nuclear cataract, bilateral: Secondary | ICD-10-CM | POA: Diagnosis not present

## 2015-10-30 DIAGNOSIS — H02839 Dermatochalasis of unspecified eye, unspecified eyelid: Secondary | ICD-10-CM | POA: Diagnosis not present

## 2015-10-30 DIAGNOSIS — H2511 Age-related nuclear cataract, right eye: Secondary | ICD-10-CM | POA: Diagnosis not present

## 2015-11-03 ENCOUNTER — Other Ambulatory Visit (HOSPITAL_COMMUNITY): Payer: Self-pay | Admitting: Cardiology

## 2015-11-12 ENCOUNTER — Telehealth (HOSPITAL_COMMUNITY): Payer: Self-pay | Admitting: *Deleted

## 2015-11-12 NOTE — Telephone Encounter (Signed)
Received note from [facility], pt needs clearance for cataract extraction for both eyes, per Dr Aundra Dubin pt stable to proceed with procedure, note signed and faxed back to them at [phone]

## 2015-11-16 ENCOUNTER — Other Ambulatory Visit: Payer: Self-pay | Admitting: Endocrinology

## 2015-11-26 ENCOUNTER — Other Ambulatory Visit: Payer: Self-pay | Admitting: *Deleted

## 2015-11-26 MED ORDER — DIAZEPAM 10 MG PO TABS: ORAL_TABLET | ORAL | 1 refills | Status: DC

## 2015-11-26 NOTE — Telephone Encounter (Signed)
Faxed refill request. Last Filled: 30 tablet 1 06/05/2015  Last office visit:   04/04/15.  Please advise.

## 2015-11-26 NOTE — Telephone Encounter (Signed)
Due for f/u 4mn OV.  Please call in.  Thanks.

## 2015-11-27 NOTE — Telephone Encounter (Signed)
Spoke with Manuela Schwartz (wife) and she will schedule 30 min OV when she can look at her calendar.

## 2015-12-16 DIAGNOSIS — H25013 Cortical age-related cataract, bilateral: Secondary | ICD-10-CM | POA: Diagnosis not present

## 2015-12-17 DIAGNOSIS — H2511 Age-related nuclear cataract, right eye: Secondary | ICD-10-CM | POA: Diagnosis not present

## 2015-12-18 DIAGNOSIS — H2512 Age-related nuclear cataract, left eye: Secondary | ICD-10-CM | POA: Diagnosis not present

## 2015-12-28 ENCOUNTER — Ambulatory Visit (INDEPENDENT_AMBULATORY_CARE_PROVIDER_SITE_OTHER): Payer: PPO | Admitting: Family Medicine

## 2015-12-28 ENCOUNTER — Encounter: Payer: Self-pay | Admitting: Family Medicine

## 2015-12-28 ENCOUNTER — Other Ambulatory Visit: Payer: Self-pay | Admitting: *Deleted

## 2015-12-28 VITALS — BP 128/58 | HR 77 | Temp 98.5°F | Wt 240.5 lb

## 2015-12-28 DIAGNOSIS — IMO0002 Reserved for concepts with insufficient information to code with codable children: Secondary | ICD-10-CM

## 2015-12-28 DIAGNOSIS — E1165 Type 2 diabetes mellitus with hyperglycemia: Secondary | ICD-10-CM

## 2015-12-28 DIAGNOSIS — G4700 Insomnia, unspecified: Secondary | ICD-10-CM | POA: Diagnosis not present

## 2015-12-28 DIAGNOSIS — D649 Anemia, unspecified: Secondary | ICD-10-CM

## 2015-12-28 DIAGNOSIS — D509 Iron deficiency anemia, unspecified: Secondary | ICD-10-CM

## 2015-12-28 DIAGNOSIS — R413 Other amnesia: Secondary | ICD-10-CM

## 2015-12-28 DIAGNOSIS — E1129 Type 2 diabetes mellitus with other diabetic kidney complication: Secondary | ICD-10-CM

## 2015-12-28 DIAGNOSIS — R7989 Other specified abnormal findings of blood chemistry: Secondary | ICD-10-CM | POA: Insufficient documentation

## 2015-12-28 DIAGNOSIS — Z23 Encounter for immunization: Secondary | ICD-10-CM | POA: Diagnosis not present

## 2015-12-28 LAB — CBC WITH DIFFERENTIAL/PLATELET
BASOS PCT: 0.4 % (ref 0.0–3.0)
Basophils Absolute: 0 10*3/uL (ref 0.0–0.1)
EOS ABS: 0.2 10*3/uL (ref 0.0–0.7)
Eosinophils Relative: 2.5 % (ref 0.0–5.0)
HEMATOCRIT: 30.6 % — AB (ref 39.0–52.0)
Hemoglobin: 10.2 g/dL — ABNORMAL LOW (ref 13.0–17.0)
LYMPHS ABS: 1.6 10*3/uL (ref 0.7–4.0)
LYMPHS PCT: 18.6 % (ref 12.0–46.0)
MCHC: 33.3 g/dL (ref 30.0–36.0)
MCV: 86.1 fl (ref 78.0–100.0)
Monocytes Absolute: 0.7 10*3/uL (ref 0.1–1.0)
Monocytes Relative: 8.7 % (ref 3.0–12.0)
NEUTROS ABS: 5.9 10*3/uL (ref 1.4–7.7)
NEUTROS PCT: 69.8 % (ref 43.0–77.0)
PLATELETS: 265 10*3/uL (ref 150.0–400.0)
RBC: 3.56 Mil/uL — ABNORMAL LOW (ref 4.22–5.81)
RDW: 15.5 % (ref 11.5–15.5)
WBC: 8.4 10*3/uL (ref 4.0–10.5)

## 2015-12-28 LAB — IBC PANEL
Iron: 34 ug/dL — ABNORMAL LOW (ref 42–165)
Saturation Ratios: 12.3 % — ABNORMAL LOW (ref 20.0–50.0)
TRANSFERRIN: 197 mg/dL — AB (ref 212.0–360.0)

## 2015-12-28 NOTE — Progress Notes (Signed)
DM2 per endo clinic, on pump, d/w pt.  He cut out almost all beer.  "I feel good."    Flu and PNA vaccines today, d/w pt.   Sleep and nightmares d/w pt.  Nocturia dw pt, variable.  No more nightmares.  Ear ringing continues.  Had been taking valium to deal with tinnitus at night, if needed.  No ADE on med.    H/o anemia, back on iron.  Due for f/u CBC.  No known blood in stool per patient report.   Memory d/w pt.  No complaints per patient.  Mood is some better in the meantime, "manageable".  He has home stressors noted.  His step son has moved out.  Per patient he is still safe at home.    PMH and SH reviewed  ROS: Per HPI unless specifically indicated in ROS section   Meds, vitals, and allergies reviewed.   GEN: nad, alert and oriented HEENT: mucous membranes moist NECK: supple w/o LA CV: rrr PULM: ctab, no inc wob ABD: soft, +bs EXT: no edema SKIN: no acute rash

## 2015-12-28 NOTE — Telephone Encounter (Signed)
Medication phoned to pharmacy.  

## 2015-12-28 NOTE — Progress Notes (Signed)
Pre visit review using our clinic review tool, if applicable. No additional management support is needed unless otherwise documented below in the visit note. 

## 2015-12-28 NOTE — Patient Instructions (Signed)
Don't change your meds for now.  Take care.  Glad to see you.  Go to the lab on the way out.  We'll contact you with your lab report. 

## 2015-12-29 NOTE — Assessment & Plan Note (Addendum)
See notes on labs. Discussed with patient about previous labs and ongoing iron use. Per patient he is still on iron.>25 minutes spent in face to face time with patient, >50% spent in counselling or coordination of care

## 2015-12-29 NOTE — Assessment & Plan Note (Signed)
See above. Routine vaccination today. Appreciate endocrine help.

## 2015-12-29 NOTE — Assessment & Plan Note (Signed)
Related to tinnitus, and nightmares. Nightmares improve recently. He can still use when necessary benzodiazepine at night if needed for sleep. No ADE on medication. Routine cautions given.

## 2015-12-29 NOTE — Assessment & Plan Note (Signed)
He was alert and oriented today. He was talking coherently and accurately about recent oral events, including the upcoming [place] collection. He gave specific details about the candidates. It is not apparent based on interview today that he had significant memory loss.

## 2015-12-30 DIAGNOSIS — H25013 Cortical age-related cataract, bilateral: Secondary | ICD-10-CM | POA: Diagnosis not present

## 2015-12-31 DIAGNOSIS — H25013 Cortical age-related cataract, bilateral: Secondary | ICD-10-CM | POA: Diagnosis not present

## 2015-12-31 DIAGNOSIS — H2512 Age-related nuclear cataract, left eye: Secondary | ICD-10-CM | POA: Diagnosis not present

## 2016-01-04 ENCOUNTER — Ambulatory Visit (HOSPITAL_COMMUNITY)
Admission: RE | Admit: 2016-01-04 | Discharge: 2016-01-04 | Disposition: A | Discharge status: Home / Self Care | Payer: PPO | Source: Ambulatory Visit | Attending: Cardiology | Admitting: Cardiology

## 2016-01-04 ENCOUNTER — Encounter (HOSPITAL_COMMUNITY): Payer: Self-pay

## 2016-01-04 VITALS — BP 122/66 | HR 68 | Wt 241.4 lb

## 2016-01-04 DIAGNOSIS — Z79899 Other long term (current) drug therapy: Secondary | ICD-10-CM | POA: Insufficient documentation

## 2016-01-04 DIAGNOSIS — J449 Chronic obstructive pulmonary disease, unspecified: Secondary | ICD-10-CM | POA: Insufficient documentation

## 2016-01-04 DIAGNOSIS — Z9641 Presence of insulin pump (external) (internal): Secondary | ICD-10-CM | POA: Diagnosis not present

## 2016-01-04 DIAGNOSIS — G4733 Obstructive sleep apnea (adult) (pediatric): Secondary | ICD-10-CM | POA: Diagnosis not present

## 2016-01-04 DIAGNOSIS — Z7982 Long term (current) use of aspirin: Secondary | ICD-10-CM | POA: Diagnosis not present

## 2016-01-04 DIAGNOSIS — I130 Hypertensive heart and chronic kidney disease with heart failure and stage 1 through stage 4 chronic kidney disease, or unspecified chronic kidney disease: Secondary | ICD-10-CM | POA: Insufficient documentation

## 2016-01-04 DIAGNOSIS — N189 Chronic kidney disease, unspecified: Secondary | ICD-10-CM | POA: Diagnosis not present

## 2016-01-04 DIAGNOSIS — I2510 Atherosclerotic heart disease of native coronary artery without angina pectoris: Secondary | ICD-10-CM | POA: Insufficient documentation

## 2016-01-04 DIAGNOSIS — I5022 Chronic systolic (congestive) heart failure: Secondary | ICD-10-CM | POA: Insufficient documentation

## 2016-01-04 DIAGNOSIS — Z8249 Family history of ischemic heart disease and other diseases of the circulatory system: Secondary | ICD-10-CM | POA: Insufficient documentation

## 2016-01-04 DIAGNOSIS — Z6836 Body mass index (BMI) 36.0-36.9, adult: Secondary | ICD-10-CM | POA: Insufficient documentation

## 2016-01-04 DIAGNOSIS — E785 Hyperlipidemia, unspecified: Secondary | ICD-10-CM | POA: Insufficient documentation

## 2016-01-04 DIAGNOSIS — Z951 Presence of aortocoronary bypass graft: Secondary | ICD-10-CM | POA: Insufficient documentation

## 2016-01-04 DIAGNOSIS — Z95810 Presence of automatic (implantable) cardiac defibrillator: Secondary | ICD-10-CM | POA: Diagnosis not present

## 2016-01-04 DIAGNOSIS — E1122 Type 2 diabetes mellitus with diabetic chronic kidney disease: Secondary | ICD-10-CM | POA: Insufficient documentation

## 2016-01-04 DIAGNOSIS — Z87891 Personal history of nicotine dependence: Secondary | ICD-10-CM | POA: Diagnosis not present

## 2016-01-04 DIAGNOSIS — I255 Ischemic cardiomyopathy: Secondary | ICD-10-CM | POA: Diagnosis not present

## 2016-01-04 DIAGNOSIS — N183 Chronic kidney disease, stage 3 unspecified: Secondary | ICD-10-CM

## 2016-01-04 DIAGNOSIS — I739 Peripheral vascular disease, unspecified: Secondary | ICD-10-CM | POA: Insufficient documentation

## 2016-01-04 DIAGNOSIS — E669 Obesity, unspecified: Secondary | ICD-10-CM | POA: Insufficient documentation

## 2016-01-04 LAB — BASIC METABOLIC PANEL
ANION GAP: 7 (ref 5–15)
BUN: 30 mg/dL — ABNORMAL HIGH (ref 6–20)
CHLORIDE: 104 mmol/L (ref 101–111)
CO2: 25 mmol/L (ref 22–32)
Calcium: 9.6 mg/dL (ref 8.9–10.3)
Creatinine, Ser: 1.87 mg/dL — ABNORMAL HIGH (ref 0.61–1.24)
GFR calc Af Amer: 38 mL/min — ABNORMAL LOW (ref >60)
GFR, EST NON AFRICAN AMERICAN: 33 mL/min — AB (ref >60)
GLUCOSE: 178 mg/dL — AB (ref 65–99)
POTASSIUM: 4.7 mmol/L (ref 3.5–5.1)
Sodium: 136 mmol/L (ref 135–145)

## 2016-01-04 LAB — BRAIN NATRIURETIC PEPTIDE: B Natriuretic Peptide: 453 pg/mL — ABNORMAL HIGH (ref 0.0–100.0)

## 2016-01-04 NOTE — Patient Instructions (Signed)
Labs today  Your physician recommends that you schedule a follow-up appointment in: 3 months  

## 2016-01-07 NOTE — Progress Notes (Signed)
Patient ID: [name] [name], male   DOB: 06/16/1934, 81 y.o.   MRN: [id number] PCP: Dr. Damita Dunnings HF MD: Dr Aundra Dubin  81 yo with history of CAD s/p CABG, ischemic cardiomyopathy/systolic CHF, LBBB, COPD, and CKD presents for cardiology followup.  He had CABG in 2008.  Last cath in 3/12 showed patent grafts.  EF was 20-25%.  He was admitted in 10/13 with acute on chronic systolic CHF.  He was volume overloaded. He had a mild TnI elevation with no chest pain.  He was diuresed and discharged.  After that, his ICD was upgraded to a Medtronic CRT-D system.  EF improved to 35-40% on echo in 10/14. EF remained 35-40% on 3/17 echo.   Today he returns for HF follow. He is stable.  Not very active.  Had his cataracts out without problem.  Main limitation comes from knee pain, uses a cane.  No dyspnea walking around his house.  No chest pain.  No orthopnea/PND.  No lightheadedness or syncope.  Optivol: Fluid below threshold. < 1 hour activity.  No atrial fibrillation or VT.   Labs (10/13): K 3.6 => 4.2, creatinine 1.79 => 2.1 => 2.0, HDL 45, LDL 157, BNP 219 Labs (12/13): K 4, creatinine 1.9, BNP 209 Labs (1/14): LDL 61, HDL 46, LFTs normal Labs (2/14): BNP 199 Labs (3/14): K 4.1, creatinine 2.0 Labs (5/14): K 4.9, creatinine 3.0 Labs (8/14): K 4.2, creatinine 1.84 Labs (10/14): K 3.8, creatinine 1.7 Labs (2/15): K 3.6, creatinine 1.6, LDL 84, HDL 38 Labs (11/16): LDL 180, HDL 34 Labs (3/17): K 4.2 => 4, creatinine 1.63 => 2.16 => 2.6 Labs (4/17): K 4.3, creatinine 2.14 Labs (08/2015) : K 4.1 Creatinine 2.08 Labs (6/17): LDL 60, HDL 40 Labs (7/17): K 4.5, creatinine 1.9 Labs (9/17): HCT 30.6  PMH: 1. CAD: S/p CABG 2008.  Cath 3/12 with totally occluded mLAD and RCA, 80% LCx stenosis, patent LIMA-LAD, patent SVG-OM, 40% stenosis in SVG-PDA.   2. Ischemic cardiomyopathy: Echo (10/13) with severely dilated LV, moderate LVH, EF 20-25%, mild MR.  Medtronic CRT-D device 10/13.  Echo (10/14) with severely dilated  LV, mild LVH, EF 35-40% (improved).  - Echo (3/17) with EF 35-40%, moderate LVH, decreased RV systolic function.  3. LBBB 4. COPD 5. Type II diabetes 6. Hyperlipidemia 7. HTN 8. CKD 9. Obesity 10. OA: knees. 11. PTSD 12. H/o ETOH abuse 13. OSA: Severe by sleep study, on CPAP.  14. PAD: ABIs (5/14) with 0.75 right, 0.80 left.   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   FH: Father with MI at 52  ROS: All systems reviewed and negative except as per HPI.   Current Outpatient Prescriptions  Medication Sig Dispense Refill  . amLODipine (NORVASC) 10 MG tablet Take 1 tablet (10 mg total) by mouth daily. 30 tablet 3  . aspirin 81 MG tablet Take 81 mg by mouth every other day.     Marland Kitchen Besifloxacin HCl (BESIVANCE) 0.6 % SUSP Apply 1 drop to eye 3 (three) times daily.    . Bromfenac Sodium (PROLENSA) 0.07 % SOLN Apply 1 drop to eye at bedtime.    . carvedilol (COREG) 25 MG tablet TAKE 1 & 1/2 TABLETS BY MOUTH TWICE A DAY 90 tablet 5  . diazepam (VALIUM) 10 MG tablet TAKE 1/2 TO 1 TABLET BY MOUTH AT BEDTIMEAS NEEDED FOR ANXIETY OR RINGING IN EARS 30 tablet 1  . Difluprednate (DUREZOL) 0.05 % EMUL Apply 1 drop to eye  3 (three) times daily.    Marland Kitchen ENTRESTO 24-26 MG TAKE 1 TABLET BY MOUTH TWICE A DAY 60 tablet 3  . ferrous sulfate (CVS IRON) 325 (65 FE) MG tablet Take 1 tablet (325 mg total) by mouth daily with breakfast. 90 tablet 3  . glucose blood (ONETOUCH VERIO) test strip Use as instructed to check blood sugar 3 times per day dx code E11.29 100 each 3  . Insulin Disposable Pump (V-GO 20) KIT USE DAILY AS DIRECTED 1 kit 5  . Insulin Pen Needle (PEN NEEDLES) 31G X 5 MM MISC Inject 15 Units as directed 2 (two) times daily. Use pen needles for pt insulin pen. Dx E11.29 100 each 5  . nitroGLYCERIN (NITROSTAT) 0.4 MG SL tablet Place 1 tablet (0.4 mg total) under the tongue every 5 (five) minutes as needed. For chest pain 25 tablet 11  .  NOVOLOG 100 UNIT/ML injection USE MAXIMUM OF 56 UNITS PER DAY WITH V-GO PUMP 20 mL 3  . NOVOLOG FLEXPEN 100 UNIT/ML FlexPen Reported on 10/05/2015    . ONETOUCH DELICA LANCETS FINE MISC Use to check blood sugar 3 times per day dx code 250.42 100 each 3  . rosuvastatin (CRESTOR) 40 MG tablet Take 1 tablet (40 mg total) by mouth daily. 90 tablet 3  . spironolactone (ALDACTONE) 25 MG tablet Take 0.5 tablets (12.5 mg total) by mouth daily. 45 tablet 3  . torsemide (DEMADEX) 20 MG tablet Take 40 mg by mouth daily.    Marland Kitchen VICTOZA 18 MG/3ML SOPN Inject 0.2 mLs (1.2 mg total) into the skin daily. Inject once daily at the same time 2 pen 3   No current facility-administered medications for this encounter.     BP 122/66 (BP Location: Left Arm, Patient Position: Sitting, Cuff Size: Normal)   Pulse 68   Wt 241 lb 6.4 oz (109.5 kg)   SpO2 97%   BMI 36.70 kg/m  General: NAD, obese Neck: Thick, JVP 5-6  cm, no thyromegaly or thyroid nodule.  Lungs: Clear to auscultation bilaterally with normal respiratory effort. CV: Nondisplaced PMI.  Heart regular S1/S2, no S3/S4, no murmur.  1+ ankle edema.  No carotid bruit.  Difficult to palpate PT pulses.   Abdomen: Soft, nontender, no hepatosplenomegaly, no distention.  Neurologic: Alert and oriented x 3.  Psych: Normal affect. Extremities: No clubbing or cyanosis.   Assessment/Plan: 1. Chronic systolic CHF: EF 24-09% by last echo in 3/17.  Status post Medtronic CRT-D upgrade. NYHA class II-III symptoms but remains inactive.  Volume status stable and verified by Optivol.   - Continue torsemide 40 daily.  BMET/BNP today.  - Continue Coreg and spironolactone. - Continue Bidil 24/26 bid. Will not titrate at this time with elevated creatinine.  - Continue efforts at exercise.  2. CAD: s/p CABG.  No recent chest pain.  Last cath in 3/12 with patent grafts.  - Continue ASA 81. - He is back on Crestor, good lipids in 6/17.     3. Hyperlipidemia: On Crestor, good  lipids 6/17.  4. OSA: Severe OSA, on CPAP.  5. CKD: BMET today  6. PAD: Claudication improved, no pedal ulcerations or rest pain.  Not candidate for cilostazol given CHF.   7. HTN: BP controlled.    Follow up in 3 months.     [name]   01/07/2016

## 2016-01-11 ENCOUNTER — Other Ambulatory Visit: Payer: Self-pay | Admitting: *Deleted

## 2016-01-11 DIAGNOSIS — E1129 Type 2 diabetes mellitus with other diabetic kidney complication: Secondary | ICD-10-CM

## 2016-01-18 ENCOUNTER — Encounter: Payer: Self-pay | Admitting: Cardiology

## 2016-01-21 ENCOUNTER — Other Ambulatory Visit (INDEPENDENT_AMBULATORY_CARE_PROVIDER_SITE_OTHER): Payer: PPO

## 2016-01-21 DIAGNOSIS — E1129 Type 2 diabetes mellitus with other diabetic kidney complication: Secondary | ICD-10-CM

## 2016-01-21 LAB — LIPID PANEL
CHOL/HDL RATIO: 4
Cholesterol: 134 mg/dL (ref 0–200)
HDL: 34.5 mg/dL — ABNORMAL LOW (ref >39.00)
LDL Cholesterol: 74 mg/dL (ref 0–99)
NONHDL: 99.16
Triglycerides: 126 mg/dL (ref 0.0–149.0)
VLDL: 25.2 mg/dL (ref 0.0–40.0)

## 2016-01-21 LAB — HEMOGLOBIN A1C: HEMOGLOBIN A1C: 7.6 % — AB (ref 4.6–6.5)

## 2016-01-24 ENCOUNTER — Encounter: Payer: Self-pay | Admitting: Endocrinology

## 2016-01-24 ENCOUNTER — Ambulatory Visit (INDEPENDENT_AMBULATORY_CARE_PROVIDER_SITE_OTHER): Payer: PPO | Admitting: Endocrinology

## 2016-01-24 VITALS — BP 122/60 | HR 70 | Temp 97.8°F | Resp 16 | Ht 68.0 in | Wt 235.4 lb

## 2016-01-24 DIAGNOSIS — Z794 Long term (current) use of insulin: Secondary | ICD-10-CM | POA: Diagnosis not present

## 2016-01-24 DIAGNOSIS — E1165 Type 2 diabetes mellitus with hyperglycemia: Secondary | ICD-10-CM

## 2016-01-24 NOTE — Progress Notes (Signed)
Patient ID: [name] [name], male   DOB: 23-May-1934, 81 y.o.   MRN: [id number]   Reason for Appointment:  follow-up   History of Present Illness   Diagnosis: Type 2 DIABETES MELITUS, date of diagnosis: 2002   He has been on insulin since about 2008 with variable control. In 04/2012 his A1c was 10.1% His diabetes control was significantly better with adding Victoza in early 2014 and adjusting his dose of Levemir Also was able to take lower doses of Levemir than before  Recent history:   Insulin regimen:  V-go 20 units basal, boluses 8-12 units before each meal, 1-3 meals daily  His A1c is still relatively high at 7.6  Current management, blood sugar patterns and problems identified:  He is monitoring blood sugars mostly in the morning fasting which may be at variable times.  The pre-breakfast sugars are variable  On his last visit he was advised to take Victoza but he is taking this only a couple of times a week as he forgets  His glucose readings are appearing to be mostly high after his meals in the afternoon or evening but only very sporadically readings available.  This is despite his taking about 2 units more for meals compared to last visit, his boluses range from 8-12 units  Has only one reading late at night of 152  He sometimes forgets to bolus when he is eating and may or may not bolus after finishing his meal  However he has lost weight since his last visit, probably cutting back on portions and snacks    Oral hypoglycemic drugs: None         Side effects from medications: None Proper timing of medications in relation to meals:  only if eating at home .         Monitors blood glucose: Once a day or less .    Glucometer: One Touch  Mean values apply above for all meters except median for One Touch  PRE-MEAL Fasting PC breakfast  Dinner PCS  Overall  Glucose range: 114-200  212-2 45  222  198-226    Mean/median: 151     166    Meals: 2-3 meals per  day.eating breakfast at 9-10 am with either oatmeal/egg or biscuit, Sometimes cereal; lunch 2-3 PM dinner 7 pm.  May skip 1 meal a day                  Physical activity: exercise:  Minimal.  He says his legs hurt on walking           Dietician visit: Most recent: 2010             Wt Readings from Last 3 Encounters:  01/24/16 235 lb 6.4 oz (106.8 kg)  01/04/16 241 lb 6.4 oz (109.5 kg)  12/28/15 240 lb 8 oz (109.1 kg)   Lab Results  Component Value Date   HGBA1C 7.6 (H) 01/21/2016   HGBA1C 7.6 (H) 10/19/2015   HGBA1C 7.4 07/25/2015   Lab Results  Component Value Date   MICROALBUR 63.2 (H) 02/20/2015   LDLCALC 74 01/21/2016   CREATININE 1.87 (H) 01/04/2016    Lab on 01/21/2016  Component Date Value Ref Range Status  . Hgb A1c MFr Bld 01/21/2016 7.6* 4.6 - 6.5 % Final  . Cholesterol 01/21/2016 134  0 - 200 mg/dL Final  . Triglycerides 01/21/2016 126.0  0.0 - 149.0 mg/dL Final  . HDL 01/21/2016 34.50* >39.00 mg/dL Final  .  VLDL 01/21/2016 25.2  0.0 - 40.0 mg/dL Final  . LDL Cholesterol 01/21/2016 74  0 - 99 mg/dL Final  . Total CHOL/HDL Ratio 01/21/2016 4   Final  . NonHDL 01/21/2016 99.16   Final     OTHER problems: Reviewed in ROS       Medication List       Accurate as of 01/24/16  3:56 PM. Always use your most recent med list.          amLODipine 10 MG tablet Commonly known as:  NORVASC Take 1 tablet (10 mg total) by mouth daily.   aspirin 81 MG tablet Take 81 mg by mouth every other day.   BESIVANCE 0.6 % Susp Generic drug:  Besifloxacin HCl Apply 1 drop to eye 3 (three) times daily.   carvedilol 25 MG tablet Commonly known as:  COREG TAKE 1 & 1/2 TABLETS BY MOUTH TWICE A DAY   diazepam 10 MG tablet Commonly known as:  VALIUM TAKE 1/2 TO 1 TABLET BY MOUTH AT BEDTIMEAS NEEDED FOR ANXIETY OR RINGING IN EARS   DUREZOL 0.05 % Emul Generic drug:  Difluprednate Apply 1 drop to eye 3 (three) times daily.   ENTRESTO 24-26 MG Generic drug:   sacubitril-valsartan TAKE 1 TABLET BY MOUTH TWICE A DAY   ferrous sulfate 325 (65 FE) MG tablet Commonly known as:  CVS IRON Take 1 tablet (325 mg total) by mouth daily with breakfast.   glucose blood test strip Commonly known as:  ONETOUCH VERIO Use as instructed to check blood sugar 3 times per day dx code E11.29   nitroGLYCERIN 0.4 MG SL tablet Commonly known as:  NITROSTAT Place 1 tablet (0.4 mg total) under the tongue every 5 (five) minutes as needed. For chest pain   NOVOLOG FLEXPEN 100 UNIT/ML FlexPen Generic drug:  insulin aspart 56 Units. Use max 56 units per day with V-go   NOVOLOG 100 UNIT/ML injection Generic drug:  insulin aspart USE MAXIMUM OF 56 UNITS PER DAY WITH V-GO PUMP   ONETOUCH DELICA LANCETS FINE Misc Use to check blood sugar 3 times per day dx code 250.42   Pen Needles 31G X 5 MM Misc Inject 15 Units as directed 2 (two) times daily. Use pen needles for pt insulin pen. Dx E11.29   PROLENSA 0.07 % Soln Generic drug:  Bromfenac Sodium Apply 1 drop to eye at bedtime.   rosuvastatin 40 MG tablet Commonly known as:  CRESTOR Take 1 tablet (40 mg total) by mouth daily.   spironolactone 25 MG tablet Commonly known as:  ALDACTONE Take 0.5 tablets (12.5 mg total) by mouth daily.   torsemide 20 MG tablet Commonly known as:  DEMADEX Take 40 mg by mouth daily.   V-GO 20 Kit USE DAILY AS DIRECTED   VICTOZA 18 MG/3ML Sopn Generic drug:  liraglutide Inject 0.2 mLs (1.2 mg total) into the skin daily. Inject once daily at the same time       Allergies:  Allergies  Allergen Reactions  . Sulfa Antibiotics Swelling and Rash    Hands swell    Past Medical History:  Diagnosis Date  . Anxiety   . Arthritis    "hands" (01/06/2012)  . Boil    "fluid boil left arm" (01/30/2012)  . CAD (coronary artery disease)    bypass graft surgery 05/2006; grafts were widely patent on cath 06/2010   . Chronic kidney disease   . COPD (chronic obstructive pulmonary  disease) ([place]) 1993  . Deafness  in left ear   . Diabetes mellitus, type 2 ([place]) 2000  . Elbow mass    left; "just noticed it ~ 2 wk ago"  (01/06/2012)  . Foot fracture 1953; 1962   left; left  . Hyperlipidemia 1993  . Hypertension 1993  . Ischemic cardiomyopathy    EF 25-30%; s/p single chamber ICD 2010; upgrade to Medtronic BiV ICD 01/30/12  . LBBB (left bundle branch block)   . Myocardial infarction 2008; 01/06/2012  . Nightmares    with psych eval 2012 at [facility]  . PTSD (post-traumatic stress disorder)   . Shortness of breath 01/05/2012   "all the time; been going on for awhile"  . Syncope and collapse 01/05/2012  . Systolic heart failure    class II  . Tinnitus of left ear     Past Surgical History:  Procedure Laterality Date  . APPENDECTOMY  1957  . BI-VENTRICULAR IMPLANTABLE CARDIOVERTER DEFIBRILLATOR UPGRADE N/A 01/30/2012   Procedure: BI-VENTRICULAR IMPLANTABLE CARDIOVERTER DEFIBRILLATOR UPGRADE;  Surgeon: Evans Lance, MD;  Location[facility] CATH LAB;  Service: Cardiovascular;  Laterality: N/A;  . CARDIAC DEFIBRILLATOR PLACEMENT  01/30/2012   biventricular ICD  . CARDIAC DEFIBRILLATOR PLACEMENT  01/30/2012   biventricular ICD  . CORONARY ARTERY BYPASS GRAFT  2008   3 vessel, class 4 CHF, non Q wave MI 05/19/06  . [place]; ~ 1962   left; left  . INSERT / REPLACE / REMOVE PACEMAKER  01/30/2012   biventricular ICD placed    Family History  Problem Relation Age of Onset  . Heart disease Father     MI age 86  . Prostate cancer Father   . Diabetes Sister   . Hyperlipidemia Sister   . Cancer Maternal Grandmother     liver, cirrhosis  . Depression Neg Hx   . Alcohol abuse Neg Hx   . Drug abuse Neg Hx   . Stroke Neg Hx   . Colon cancer Neg Hx     Social History:  reports that he has quit smoking. His smoking use included Cigarettes. He has a 17.50 pack-year smoking history. He has never used smokeless tobacco. He reports that he drinks about 4.2 oz of  alcohol per week . He reports that he does not use drugs.  Review of Systems:   HYPERTENSION treated with Coreg, Norvasc Aldactone 25 mg Also on Demadex  This is followed by PCP and cardiologist He is  taking 10 mg Norvasc  HYPERLIPIDEMIA:   Has been managed by PCP and cardiologist  Even with taking 40 mg Crestor LDL is relatively higher and over 70 now    Lab Results  Component Value Date   CHOL 134 01/21/2016   HDL 34.50 (L) 01/21/2016   LDLCALC 74 01/21/2016   LDLDIRECT 94.0 07/31/2014   TRIG 126.0 01/21/2016   CHOLHDL 4 01/21/2016   CKD: His creatinine is  stable and followed by nephrologist   Lab Results  Component Value Date   CREATININE 1.87 (H) 01/04/2016    Last foot exam in 7/17 Some decreased monofilament sensation on the right and pedal pulses not palpable      Examination:   BP 122/60   Pulse 70   Temp 97.8 F (36.6 C)   Resp 16   Ht _0  (1.727 m)   Wt 235 lb 6.4 oz (106.8 kg)   SpO2 96%   BMI 35.79 kg/m   Body mass index is 35.79 kg/m.   ASSESSMENT/ PLAN:  Diabetes type 2 With BMI 37 See history of present illness for detailed discussion of current diabetes management, blood sugar patterns and problems identified  Overall he has done well with the V-go pump requiring  smaller doses of insulin He is generally compliant with using the pump but occasionally not bolusing before eating Also is forgetting to take his Victoza which probably is helping his weight since his last visit as well as reducing fasting readings when he takes the injection Discussed ways of regimen to take Victoza can take it either when he is changing his pump or doing his morning bolus He can take this out of the refrigerator for 2 weeks Discussed need to check more readings after meals as he is checking primarily fasting readings He can at least do readings at bedtime He will try to walk short distances more frequently for exercise   HYPERLIPIDEMIA: His LDL is  relatively higher and it may be from his forgetting to take her Crestor regularly, emphasized need for getting LDL below 70  Counseling time on subjects discussed above is over 50% of today's 25 minute visit   Patient Instructions  Victoza 1.22m in am daily at same time as other am meds, can leave it on kitchen table  Take at least 5 clicks at meals, upto 7, may click just after eating if forgetting        Radhika Dershem 01/24/2016, 3:56 PM

## 2016-01-24 NOTE — Patient Instructions (Addendum)
Victoza 1.'2mg'$  in am daily at same time as other am meds, can leave it on kitchen table  Take at least 5 clicks at meals, upto 7, may click just after eating if forgetting  Check blood sugars on waking up 4x per week   Also check blood sugars about 2 hours after a meal and do this after different meals by rotation  Recommended blood sugar levels on waking up is 90-130 and about 2 hours after meal is 130-160  Please bring your blood sugar monitor to each visit, thank you

## 2016-02-13 ENCOUNTER — Other Ambulatory Visit (HOSPITAL_COMMUNITY): Payer: Self-pay | Admitting: Cardiology

## 2016-03-05 ENCOUNTER — Telehealth: Payer: Self-pay | Admitting: Family Medicine

## 2016-03-05 NOTE — Telephone Encounter (Signed)
LVM for pt to call back and schedule AWV + labs with Lesia and CPE with PCP. °

## 2016-03-05 NOTE — Telephone Encounter (Signed)
[name] [name] 12/11 Dr Damita Dunnings  12/26 Pt aware

## 2016-03-16 ENCOUNTER — Other Ambulatory Visit: Payer: Self-pay | Admitting: Family Medicine

## 2016-03-16 DIAGNOSIS — D509 Iron deficiency anemia, unspecified: Secondary | ICD-10-CM

## 2016-03-17 ENCOUNTER — Ambulatory Visit (INDEPENDENT_AMBULATORY_CARE_PROVIDER_SITE_OTHER): Payer: PPO

## 2016-03-17 VITALS — BP 116/70 | HR 87 | Temp 97.8°F | Ht 67.0 in | Wt 233.8 lb

## 2016-03-17 DIAGNOSIS — Z0000 Encounter for general adult medical examination without abnormal findings: Secondary | ICD-10-CM

## 2016-03-17 DIAGNOSIS — D509 Iron deficiency anemia, unspecified: Secondary | ICD-10-CM

## 2016-03-17 LAB — BASIC METABOLIC PANEL
BUN: 20 mg/dL (ref 6–23)
CALCIUM: 9.4 mg/dL (ref 8.4–10.5)
CO2: 24 meq/L (ref 19–32)
CREATININE: 2.32 mg/dL — AB (ref 0.40–1.50)
Chloride: 103 mEq/L (ref 96–112)
GFR: 28.99 mL/min — AB (ref >60.00)
Glucose, Bld: 140 mg/dL — ABNORMAL HIGH (ref 70–99)
Potassium: 4.2 mEq/L (ref 3.5–5.1)
SODIUM: 137 meq/L (ref 135–145)

## 2016-03-17 LAB — CBC WITH DIFFERENTIAL/PLATELET
BASOS ABS: 0 10*3/uL (ref 0.0–0.1)
Basophils Relative: 0.3 % (ref 0.0–3.0)
EOS ABS: 0.1 10*3/uL (ref 0.0–0.7)
Eosinophils Relative: 1.8 % (ref 0.0–5.0)
HCT: 30.1 % — ABNORMAL LOW (ref 39.0–52.0)
Hemoglobin: 9.9 g/dL — ABNORMAL LOW (ref 13.0–17.0)
LYMPHS ABS: 1.2 10*3/uL (ref 0.7–4.0)
Lymphocytes Relative: 14.7 % (ref 12.0–46.0)
MCHC: 33 g/dL (ref 30.0–36.0)
MCV: 84.8 fl (ref 78.0–100.0)
MONO ABS: 0.7 10*3/uL (ref 0.1–1.0)
Monocytes Relative: 8.5 % (ref 3.0–12.0)
NEUTROS ABS: 6.2 10*3/uL (ref 1.4–7.7)
NEUTROS PCT: 74.7 % (ref 43.0–77.0)
PLATELETS: 296 10*3/uL (ref 150.0–400.0)
RBC: 3.56 Mil/uL — AB (ref 4.22–5.81)
RDW: 14.7 % (ref 11.5–15.5)
WBC: 8.4 10*3/uL (ref 4.0–10.5)

## 2016-03-17 LAB — IBC PANEL
IRON: 25 ug/dL — AB (ref 42–165)
SATURATION RATIOS: 9.8 % — AB (ref 20.0–50.0)
Transferrin: 183 mg/dL — ABNORMAL LOW (ref 212.0–360.0)

## 2016-03-17 NOTE — Progress Notes (Signed)
Pre visit review using our clinic review tool, if applicable. No additional management support is needed unless otherwise documented below in the visit note. 

## 2016-03-17 NOTE — Progress Notes (Addendum)
PCP notes:   Health maintenance:  Shingles vaccine - pt declined stating he already had shingles Tetanus vaccine - pt declined stating he already vaccine ; pt will review records to determine date and place of vaccine  Abnormal screenings:   Hearing - failed Fall risk - hx of accidental fall without injury  Patient concerns:   Pt states he is continuing with weight loss efforts in order to minimize knee pain  Nurse concerns:  None  Next PCP appt:   04/01/16 @ 1215  I reviewed health advisor's note, was available for consultation on the day of service listed in this note, and agree with documentation and plan. Elsie Stain, MD.

## 2016-03-17 NOTE — Patient Instructions (Signed)
[name] [name] , Thank you for taking time to come for your Medicare Wellness Visit. I appreciate your ongoing commitment to your health goals. Please review the following plan we discussed and let me know if I can assist you in the future.   These are the goals we discussed: Goals    . Increase water intake          Starting 03/17/2016, I will continue to drink at least 4 glasses of water daily.        This is a list of the screening recommended for you and due dates:  Health Maintenance  Topic Date Due  . Shingles Vaccine  03/17/2017*  . Tetanus Vaccine  03/17/2026*  . Hemoglobin A1C  07/21/2016  . Eye exam for diabetics  07/23/2016  . Complete foot exam   10/23/2016  . Flu Shot  Completed  . Pneumonia vaccines  Completed  *Topic was postponed. The date shown is not the original due date.   Preventive Care for Adults  A healthy lifestyle and preventive care can promote health and wellness. Preventive health guidelines for adults include the following key practices.  . A routine yearly physical is a good way to check with your health care provider about your health and preventive screening. It is a chance to share any concerns and updates on your health and to receive a thorough exam.  . Visit your dentist for a routine exam and preventive care every 6 months. Brush your teeth twice a day and floss once a day. Good oral hygiene prevents tooth decay and gum disease.  . The frequency of eye exams is based on your age, health, family medical history, use  of contact lenses, and other factors. Follow your health care provider's ecommendations for frequency of eye exams.  . Eat a healthy diet. Foods like vegetables, fruits, whole grains, low-fat dairy products, and lean protein foods contain the nutrients you need without too many calories. Decrease your intake of foods high in solid fats, added sugars, and salt. Eat the right amount of calories for you. Get information about a proper  diet from your health care provider, if necessary.  . Regular physical exercise is one of the most important things you can do for your health. Most adults should get at least 150 minutes of moderate-intensity exercise (any activity that increases your heart rate and causes you to sweat) each week. In addition, most adults need muscle-strengthening exercises on 2 or more days a week.  Silver Sneakers may be a benefit available to you. To determine eligibility, you may visit the website: www.silversneakers.com or contact program at (680)085-9080 Mon-Fri between 8AM-8PM.   . Maintain a healthy weight. The body mass index (BMI) is a screening tool to identify possible weight problems. It provides an estimate of body fat based on height and weight. Your health care provider can find your BMI and can help you achieve or maintain a healthy weight.   For adults 20 years and older: ? A BMI below 18.5 is considered underweight. ? A BMI of 18.5 to 24.9 is normal. ? A BMI of 25 to 29.9 is considered overweight. ? A BMI of 30 and above is considered obese.   . Maintain normal blood lipids and cholesterol levels by exercising and minimizing your intake of saturated fat. Eat a balanced diet with plenty of fruit and vegetables. Blood tests for lipids and cholesterol should begin at age 20 and be repeated every 5 years. If your lipid  or cholesterol levels are high, you are over 50, or you are at high risk for heart disease, you may need your cholesterol levels checked more frequently. Ongoing high lipid and cholesterol levels should be treated with medicines if diet and exercise are not working.  . If you smoke, find out from your health care provider how to quit. If you do not use tobacco, please do not start.  . If you choose to drink alcohol, please do not consume more than 2 drinks per day. One drink is considered to be 12 ounces (355 mL) of beer, 5 ounces (148 mL) of wine, or 1.5 ounces (44 mL) of  liquor.  . If you are 1-65 years old, ask your health care provider if you should take aspirin to prevent strokes.  . Use sunscreen. Apply sunscreen liberally and repeatedly throughout the day. You should seek shade when your shadow is shorter than you. Protect yourself by wearing long sleeves, pants, a wide-brimmed hat, and sunglasses year round, whenever you are outdoors.  . Once a month, do a whole body skin exam, using a mirror to look at the skin on your back. Tell your health care provider of new moles, moles that have irregular borders, moles that are larger than a pencil eraser, or moles that have changed in shape or color.

## 2016-03-17 NOTE — Progress Notes (Signed)
Subjective:   Cuthbert Turton is a 81 y.o. male who presents for an Initial Medicare Annual Wellness Visit.  Review of Systems  N/A Cardiac Risk Factors include: male gender;obesity (BMI >30kg/m2);advanced age (>27mn, >>28women);diabetes mellitus;dyslipidemia;hypertension    Objective:    Today's Vitals   03/17/16 1228  BP: 116/70  Pulse: 87  Temp: 97.8 F (36.6 C)  TempSrc: Oral  SpO2: 98%  Weight: 233 lb 12 oz (106 kg)  Height: '5\' 7"'$  (1.702 m)  PainSc: 0-No pain   Body mass index is 36.61 kg/m.  Current Medications (verified) Outpatient Encounter Prescriptions as of 03/17/2016  Medication Sig  . amLODipine (NORVASC) 10 MG tablet TAKE 1 TABLET BY MOUTH DAILY  . aspirin 81 MG tablet Take 81 mg by mouth every other day.   .Marland KitchenBesifloxacin HCl (BESIVANCE) 0.6 % SUSP Apply 1 drop to eye 3 (three) times daily.  . Bromfenac Sodium (PROLENSA) 0.07 % SOLN Apply 1 drop to eye at bedtime.  . carvedilol (COREG) 25 MG tablet TAKE 1 & 1/2 TABLETS BY MOUTH TWICE A DAY  . diazepam (VALIUM) 10 MG tablet TAKE 1/2 TO 1 TABLET BY MOUTH AT BEDTIMEAS NEEDED FOR ANXIETY OR RINGING IN EARS  . Difluprednate (DUREZOL) 0.05 % EMUL Apply 1 drop to eye 3 (three) times daily.  .Marland KitchenENTRESTO 24-26 MG TAKE 1 TABLET BY MOUTH TWICE A DAY  . ferrous sulfate ([employer] IRON) 325 (65 FE) MG tablet Take 1 tablet (325 mg total) by mouth daily with breakfast.  . glucose blood (ONETOUCH VERIO) test strip Use as instructed to check blood sugar 3 times per day dx code E11.29  . Insulin Disposable Pump (V-GO 20) KIT USE DAILY AS DIRECTED  . Insulin Pen Needle (PEN NEEDLES) 31G X 5 MM MISC Inject 15 Units as directed 2 (two) times daily. Use pen needles for pt insulin pen. Dx E11.29  . nitroGLYCERIN (NITROSTAT) 0.4 MG SL tablet Place 1 tablet (0.4 mg total) under the tongue every 5 (five) minutes as needed. For chest pain  . NOVOLOG 100 UNIT/ML injection USE MAXIMUM OF 56 UNITS PER DAY WITH V-GO PUMP  . NOVOLOG FLEXPEN 100  UNIT/ML FlexPen 56 Units. Use max 56 units per day with V-go  . ONETOUCH DELICA LANCETS FINE MISC Use to check blood sugar 3 times per day dx code 250.42  . rosuvastatin (CRESTOR) 40 MG tablet Take 1 tablet (40 mg total) by mouth daily.  .Marland Kitchenspironolactone (ALDACTONE) 25 MG tablet Take 0.5 tablets (12.5 mg total) by mouth daily.  .Marland Kitchentorsemide (DEMADEX) 20 MG tablet Take 40 mg by mouth daily.  .Marland KitchenVICTOZA 18 MG/3ML SOPN Inject 0.2 mLs (1.2 mg total) into the skin daily. Inject once daily at the same time   No facility-administered encounter medications on file as of 03/17/2016.     Allergies (verified) Sulfa antibiotics   History: Past Medical History:  Diagnosis Date  . Anxiety   . Arthritis    "hands" (01/06/2012)  . Boil    "fluid boil left arm" (01/30/2012)  . CAD (coronary artery disease)    bypass graft surgery 05/2006; grafts were widely patent on cath 06/2010   . Chronic kidney disease   . COPD [place] obstructive [place] disease) [place]  . 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 [place] 2 [place]  . Elbow mass    left; "just noticed it ~ 2 wk ago"  (01/06/2012)  . Foot fracture 1953; 1962   left; left  .  Hyperlipidemia 1993  . Hypertension 1993  . Ischemic cardiomyopathy    EF 25-30%; s/p single chamber ICD 2010; upgrade to Medtronic BiV ICD 01/30/12  . LBBB (left bundle branch block)   . Myocardial infarction 2008; 01/06/2012  . Nightmares    with psych eval 2012 at [facility]  . PTSD (post-traumatic stress disorder)   . Shortness of breath 01/05/2012   "all the time; been going on for awhile"  . Syncope and collapse 01/05/2012  . Systolic heart failure    class II  . Tinnitus of left ear    Past Surgical History:  Procedure Laterality Date  . APPENDECTOMY  1957  . BI-VENTRICULAR IMPLANTABLE CARDIOVERTER DEFIBRILLATOR UPGRADE N/A 01/30/2012   Procedure: BI-VENTRICULAR IMPLANTABLE CARDIOVERTER DEFIBRILLATOR UPGRADE;  Surgeon: Evans Lance, MD;  Location[facility] CATH  LAB;  Service: Cardiovascular;  Laterality: N/A;  . CARDIAC DEFIBRILLATOR PLACEMENT  01/30/2012   biventricular ICD  . CARDIAC DEFIBRILLATOR PLACEMENT  01/30/2012   biventricular ICD  . CORONARY ARTERY BYPASS GRAFT  2008   3 vessel, class 4 CHF, non Q wave MI 05/19/06  . [place]; ~ 1962   left; left  . INSERT / REPLACE / REMOVE PACEMAKER  01/30/2012   biventricular ICD placed   Family History  Problem Relation Age of Onset  . Heart disease Father     MI age 14  . Prostate cancer Father   . Diabetes Sister   . Hyperlipidemia Sister   . Cancer Maternal Grandmother     liver, cirrhosis  . Depression Neg Hx   . Alcohol abuse Neg Hx   . Drug abuse Neg Hx   . Stroke Neg Hx   . Colon cancer Neg Hx    Social History   Occupational History  . 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 Smoking status: Former Smoker    Packs/day: 0.50    Years: 35.00    Types: Cigarettes  . Smokeless tobacco: Never Used     Comment: 01/06/2012 "quit smoking in 2008; slip and have one ocasionally still"  . Alcohol use 4.2 oz/week    7 Glasses of wine per week     Comment: 01/06/2012 "big bottle of wine at least 3 nights/wk; 6 pack pretty much q night"; 01/30/2012 "maybe a glass of wine q hs since left hospital last"". 06/2013 "a beer or two a week"  . Drug use: No  . Sexual activity: No   Tobacco Counseling Counseling given: No   Activities of Daily Living In your present state of health, do you have any difficulty performing the following activities: 03/17/2016  Hearing? Y  Vision? N  Difficulty concentrating or making decisions? N  Walking or climbing stairs? Y  Dressing or bathing? N  Doing errands, shopping? N  Preparing Food and eating ? N  Using the Toilet? N  In the past six months, have you accidently leaked urine? N  Do you have problems with loss of bowel control? N  Managing your Medications? N  Managing your Finances? N  Housekeeping  or managing your Housekeeping? N  Some recent data might be hidden    Immunizations and Health Maintenance Immunization History  Administered Date(s) Administered  . Influenza Split 01/09/2011, 01/07/2012  . Influenza,inj,Quad PF,36+ Mos 02/08/2013, 12/28/2015  . Pneumococcal Conjugate-13 12/28/2015  . Pneumococcal Polysaccharide-23 06/10/2013   There are no preventive care reminders to display for this patient.  Patient Care  Team: Tonia Ghent, MD as PCP - General (Family Medicine) Larey Dresser, MD as Referring Physician (Cardiology) Lavonia Dana, MD as Consulting Physician (Internal Medicine)    Assessment:   This is a routine wellness examination for [place].   Hearing/Vision screen  Hearing Screening   '125Hz'$  '250Hz'$  '500Hz'$  '1000Hz'$  '2000Hz'$  '3000Hz'$  '4000Hz'$  '6000Hz'$  '8000Hz'$   Right ear:   0 0 40  0    Left ear:   0 0 0  0    Vision Screening Comments: Last vision exam in April 2017  Dietary issues and exercise activities discussed: Current Exercise Habits: The patient does not participate in regular exercise at present, Exercise limited by: orthopedic condition(s)  Goals    . Increase water intake          Starting 03/17/2016, I will continue to drink at least 4 glasses of water daily.       Depression Screen PHQ 2/9 Scores 03/17/2016 09/07/2014 06/10/2013  PHQ - 2 Score 0 0 0    Fall Risk Fall Risk  03/17/2016 09/07/2014 06/10/2013  Falls in the past year? Yes Yes No  Number falls in past yr: 1 2 or more -  Injury with Fall? No - -  Follow up Falls evaluation completed - -    Cognitive Function: MMSE - Mini Mental State Exam 03/17/2016  Orientation to time 5  Orientation to Place 5  Registration 3  Attention/ Calculation 0  Recall 3  Language- name 2 objects 0  Language- repeat 1  Language- follow 3 step command 3  Language- read & follow direction 0  Write a sentence 0  Copy design 0  Total score 20       PLEASE NOTE: A Mini-Cog screen was completed. Maximum  score is 20. A value of 0 denotes this part of Folstein MMSE was not completed or the patient failed this part of the Mini-Cog screening.   Mini-Cog Screening Orientation to Time - Max 5 pts Orientation to Place - Max 5 pts Registration - Max 3 pts Recall - Max 3 pts Language Repeat - Max 1 pts Language Follow 3 Step Command - Max 3 pts   Screening Tests Health Maintenance  Topic Date Due  . ZOSTAVAX  03/17/2017 (Originally 12/03/1996)  . TETANUS/TDAP  03/17/2026 (Originally 12/04/1953)  . HEMOGLOBIN A1C  07/21/2016  . OPHTHALMOLOGY EXAM  07/23/2016  . FOOT EXAM  10/23/2016  . INFLUENZA VACCINE  Completed  . PNA vac Low Risk Adult  Completed        Plan:     I have personally reviewed and addressed the Medicare Annual Wellness questionnaire and have noted the following in the patient's chart:  A. Medical and social history B. Use of alcohol, tobacco or illicit drugs  C. Current medications and supplements D. Functional ability and status E.  Nutritional status F.  Physical activity G. Advance directives H. List of other physicians I.  Hospitalizations, surgeries, and ER visits in previous 12 months J.  [place] to include hearing, vision, cognitive, depression L. Referrals and appointments - none  In addition, I have reviewed and discussed with patient certain preventive protocols, quality metrics, and best practice recommendations. A written personalized care plan for preventive services as well as general preventive health recommendations were provided to patient.  See attached scanned questionnaire for additional information.   Signed,   Lindell Noe, MHA, BS, LPN Health Coach

## 2016-03-20 ENCOUNTER — Other Ambulatory Visit: Payer: Self-pay | Admitting: Family Medicine

## 2016-03-20 ENCOUNTER — Telehealth: Payer: Self-pay | Admitting: Family Medicine

## 2016-03-20 DIAGNOSIS — R7989 Other specified abnormal findings of blood chemistry: Secondary | ICD-10-CM | POA: Insufficient documentation

## 2016-03-20 NOTE — Telephone Encounter (Signed)
[name] [name] returned your call

## 2016-03-24 ENCOUNTER — Other Ambulatory Visit (INDEPENDENT_AMBULATORY_CARE_PROVIDER_SITE_OTHER): Payer: PPO

## 2016-03-24 DIAGNOSIS — R7989 Other specified abnormal findings of blood chemistry: Secondary | ICD-10-CM | POA: Diagnosis not present

## 2016-03-24 LAB — BASIC METABOLIC PANEL
BUN: 35 mg/dL — ABNORMAL HIGH (ref 6–23)
CALCIUM: 8.9 mg/dL (ref 8.4–10.5)
CO2: 25 meq/L (ref 19–32)
Chloride: 100 mEq/L (ref 96–112)
Creatinine, Ser: 2.9 mg/dL — ABNORMAL HIGH (ref 0.40–1.50)
GFR: 22.41 mL/min — ABNORMAL LOW (ref >60.00)
Glucose, Bld: 115 mg/dL — ABNORMAL HIGH (ref 70–99)
Potassium: 4.1 mEq/L (ref 3.5–5.1)
SODIUM: 137 meq/L (ref 135–145)

## 2016-03-25 ENCOUNTER — Ambulatory Visit (HOSPITAL_COMMUNITY)
Admission: RE | Admit: 2016-03-25 | Discharge: 2016-03-25 | Disposition: A | Discharge status: Home / Self Care | Payer: PPO | Source: Ambulatory Visit | Attending: Cardiology | Admitting: Cardiology

## 2016-03-25 ENCOUNTER — Encounter (HOSPITAL_COMMUNITY): Payer: Self-pay

## 2016-03-25 ENCOUNTER — Other Ambulatory Visit: Payer: Self-pay | Admitting: Family Medicine

## 2016-03-25 VITALS — BP 136/70 | HR 83 | Wt 231.1 lb

## 2016-03-25 DIAGNOSIS — Z87891 Personal history of nicotine dependence: Secondary | ICD-10-CM | POA: Insufficient documentation

## 2016-03-25 DIAGNOSIS — E785 Hyperlipidemia, unspecified: Secondary | ICD-10-CM | POA: Diagnosis not present

## 2016-03-25 DIAGNOSIS — J449 Chronic obstructive pulmonary disease, unspecified: Secondary | ICD-10-CM | POA: Diagnosis not present

## 2016-03-25 DIAGNOSIS — E1151 Type 2 diabetes mellitus with diabetic peripheral angiopathy without gangrene: Secondary | ICD-10-CM | POA: Diagnosis not present

## 2016-03-25 DIAGNOSIS — Z7982 Long term (current) use of aspirin: Secondary | ICD-10-CM | POA: Insufficient documentation

## 2016-03-25 DIAGNOSIS — Z9889 Other specified postprocedural states: Secondary | ICD-10-CM | POA: Insufficient documentation

## 2016-03-25 DIAGNOSIS — I5022 Chronic systolic (congestive) heart failure: Secondary | ICD-10-CM

## 2016-03-25 DIAGNOSIS — Z951 Presence of aortocoronary bypass graft: Secondary | ICD-10-CM | POA: Insufficient documentation

## 2016-03-25 DIAGNOSIS — Z8249 Family history of ischemic heart disease and other diseases of the circulatory system: Secondary | ICD-10-CM | POA: Insufficient documentation

## 2016-03-25 DIAGNOSIS — N183 Chronic kidney disease, stage 3 unspecified: Secondary | ICD-10-CM

## 2016-03-25 DIAGNOSIS — E669 Obesity, unspecified: Secondary | ICD-10-CM | POA: Diagnosis not present

## 2016-03-25 DIAGNOSIS — F4310 Post-traumatic stress disorder, unspecified: Secondary | ICD-10-CM | POA: Diagnosis not present

## 2016-03-25 DIAGNOSIS — I2510 Atherosclerotic heart disease of native coronary artery without angina pectoris: Secondary | ICD-10-CM | POA: Insufficient documentation

## 2016-03-25 DIAGNOSIS — I5020 Unspecified systolic (congestive) heart failure: Secondary | ICD-10-CM | POA: Insufficient documentation

## 2016-03-25 DIAGNOSIS — I255 Ischemic cardiomyopathy: Secondary | ICD-10-CM | POA: Diagnosis not present

## 2016-03-25 DIAGNOSIS — R7989 Other specified abnormal findings of blood chemistry: Secondary | ICD-10-CM

## 2016-03-25 DIAGNOSIS — Z794 Long term (current) use of insulin: Secondary | ICD-10-CM | POA: Diagnosis not present

## 2016-03-25 DIAGNOSIS — I130 Hypertensive heart and chronic kidney disease with heart failure and stage 1 through stage 4 chronic kidney disease, or unspecified chronic kidney disease: Secondary | ICD-10-CM | POA: Insufficient documentation

## 2016-03-25 DIAGNOSIS — E1122 Type 2 diabetes mellitus with diabetic chronic kidney disease: Secondary | ICD-10-CM | POA: Insufficient documentation

## 2016-03-25 DIAGNOSIS — E1142 Type 2 diabetes mellitus with diabetic polyneuropathy: Secondary | ICD-10-CM | POA: Diagnosis not present

## 2016-03-25 DIAGNOSIS — M1710 Unilateral primary osteoarthritis, unspecified knee: Secondary | ICD-10-CM | POA: Diagnosis not present

## 2016-03-25 DIAGNOSIS — G4733 Obstructive sleep apnea (adult) (pediatric): Secondary | ICD-10-CM | POA: Diagnosis not present

## 2016-03-25 MED ORDER — PREGABALIN 25 MG PO CAPS: 25.0000 mg | ORAL_CAPSULE | Freq: Two times a day (BID) | ORAL | 3 refills | Status: AC

## 2016-03-25 MED ORDER — TORSEMIDE 20 MG PO TABS: 20.0000 mg | ORAL_TABLET | ORAL | 3 refills | Status: DC

## 2016-03-25 NOTE — Patient Instructions (Signed)
STOP taking Entresto  Hold Torsemide for additional 4 days.  On December 24th START taking Torsemide '20mg'$  (1 Tab) Every other day.  START taking Lyrica 25 mg (1 Tab) Once Daily.  START taking Glucerna   Labs this Friday December 22nd  Follow up in 2 weeks

## 2016-03-25 NOTE — Progress Notes (Signed)
Patient ID: [name] [name], male   DOB: 01-13-1935, 81 y.o.   MRN: [id number] PCP: Dr. Damita Dunnings Cardiology: Dr Aundra Dubin  81 yo with history of CAD s/p CABG, ischemic cardiomyopathy/systolic CHF, LBBB, COPD, and CKD presents for cardiology followup.  He had CABG in 2008.  Last cath in 3/12 showed patent grafts.  EF was 20-25%.  He was admitted in 10/13 with acute on chronic systolic CHF.  He was volume overloaded. He had a mild TnI elevation with no chest pain.  He was diuresed and discharged.  After that, his ICD was upgraded to a Medtronic CRT-D system.  EF improved to 35-40% on echo in 10/14. EF remained 35-40% on 3/17 echo.   Today he returns for HF follow. Creatinine has been running high recently.  He held torsemide for a couple of days over the weekend with creatinine 2.32 but is back on it.  Creatinine repeated this week was 2.9.  Weight is down 10 lbs.  He is not eating as much as in the past.  No exertional dyspnea, limited by knee pain as well as severe neuropathy pain in his feet.  No chest pain, no orthopnea/PND.    Labs (10/13): K 3.6 => 4.2, creatinine 1.79 => 2.1 => 2.0, HDL 45, LDL 157, BNP 219 Labs (12/13): K 4, creatinine 1.9, BNP 209 Labs (1/14): LDL 61, HDL 46, LFTs normal Labs (2/14): BNP 199 Labs (3/14): K 4.1, creatinine 2.0 Labs (5/14): K 4.9, creatinine 3.0 Labs (8/14): K 4.2, creatinine 1.84 Labs (10/14): K 3.8, creatinine 1.7 Labs (2/15): K 3.6, creatinine 1.6, LDL 84, HDL 38 Labs (11/16): LDL 180, HDL 34 Labs (3/17): K 4.2 => 4, creatinine 1.63 => 2.16 => 2.6 Labs (4/17): K 4.3, creatinine 2.14 Labs (08/2015) : K 4.1 Creatinine 2.08 Labs (6/17): LDL 60, HDL 40 Labs (7/17): K 4.5, creatinine 1.9 Labs (9/17): HCT 30.6 Labs (12/17): K 4.1, creatinine 2.32 => 2.9  PMH: 1. CAD: S/p CABG 2008.  Cath 3/12 with totally occluded mLAD and RCA, 80% LCx stenosis, patent LIMA-LAD, patent SVG-OM, 40% stenosis in SVG-PDA.   2. Ischemic cardiomyopathy: Echo (10/13) with severely  dilated LV, moderate LVH, EF 20-25%, mild MR.  Medtronic CRT-D device 10/13.  Echo (10/14) with severely dilated LV, mild LVH, EF 35-40% (improved).  - Echo (3/17) with EF 35-40%, moderate LVH, decreased RV systolic function.  3. LBBB 4. COPD 5. Type II diabetes with peripheral neuropathy.  6. Hyperlipidemia 7. HTN 8. CKD: Stage III.  9. Obesity 10. OA: knees. 11. PTSD 12. H/o ETOH abuse 13. OSA: Severe by sleep study, on CPAP.  14. PAD: ABIs (5/14) with 0.75 right, 0.80 left.   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   FH: Father with MI at 65  ROS: All systems reviewed and negative except as per HPI.   Current Outpatient Prescriptions  Medication Sig Dispense Refill  . amLODipine (NORVASC) 10 MG tablet TAKE 1 TABLET BY MOUTH DAILY 30 tablet 11  . aspirin 81 MG tablet Take 81 mg by mouth every other day.     Marland Kitchen Besifloxacin HCl (BESIVANCE) 0.6 % SUSP Apply 1 drop to eye 3 (three) times daily.    . Bromfenac Sodium (PROLENSA) 0.07 % SOLN Apply 1 drop to eye at bedtime.    . diazepam (VALIUM) 10 MG tablet TAKE 1/2 TO 1 TABLET BY MOUTH AT BEDTIMEAS NEEDED FOR ANXIETY OR RINGING IN EARS 30 tablet 1  . Difluprednate (DUREZOL) 0.05 %  EMUL Apply 1 drop to eye 3 (three) times daily.    . ferrous sulfate (CVS IRON) 325 (65 FE) MG tablet Take 1 tablet (325 mg total) by mouth daily with breakfast. 90 tablet 3  . glucose blood (ONETOUCH VERIO) test strip Use as instructed to check blood sugar 3 times per day dx code E11.29 100 each 3  . Insulin Disposable Pump (V-GO 20) KIT USE DAILY AS DIRECTED 1 kit 5  . Insulin Pen Needle (PEN NEEDLES) 31G X 5 MM MISC Inject 15 Units as directed 2 (two) times daily. Use pen needles for pt insulin pen. Dx E11.29 100 each 5  . nitroGLYCERIN (NITROSTAT) 0.4 MG SL tablet Place 1 tablet (0.4 mg total) under the tongue every 5 (five) minutes as needed. For chest pain 25 tablet 11  . NOVOLOG 100 UNIT/ML  injection USE MAXIMUM OF 56 UNITS PER DAY WITH V-GO PUMP 20 mL 3  . NOVOLOG FLEXPEN 100 UNIT/ML FlexPen 56 Units. Use max 56 units per day with V-go    . ONETOUCH DELICA LANCETS FINE MISC Use to check blood sugar 3 times per day dx code 250.42 100 each 3  . rosuvastatin (CRESTOR) 40 MG tablet Take 1 tablet (40 mg total) by mouth daily. 90 tablet 3  . spironolactone (ALDACTONE) 25 MG tablet Take 0.5 tablets (12.5 mg total) by mouth daily. 45 tablet 3  . torsemide (DEMADEX) 20 MG tablet Take 1 tablet (20 mg total) by mouth every other day. 15 tablet 3  . VICTOZA 18 MG/3ML SOPN Inject 0.2 mLs (1.2 mg total) into the skin daily. Inject once daily at the same time 2 pen 3  . carvedilol (COREG) 25 MG tablet TAKE 1 & 1/2 TABLETS BY MOUTH TWICE A DAY 90 tablet 3  . pregabalin (LYRICA) 25 MG capsule Take 1 capsule (25 mg total) by mouth 2 (two) times daily. 30 capsule 3   No current facility-administered medications for this encounter.     BP 136/70   Pulse 83   Wt 231 lb 1.9 oz (104.8 kg)   SpO2 97%   BMI 36.20 kg/m  General: NAD, obese Neck: Thick, JVP 5-6  cm, no thyromegaly or thyroid nodule.  Lungs: Clear to auscultation bilaterally with normal respiratory effort. CV: Nondisplaced PMI.  Heart regular S1/S2, no S3/S4, no murmur.  No edema.  No carotid bruit.  Difficult to palpate PT pulses.   Abdomen: Soft, nontender, no hepatosplenomegaly, no distention.  Neurologic: Alert and oriented x 3.  Psych: Normal affect. Extremities: No clubbing or cyanosis.   Assessment/Plan: 1. Chronic systolic CHF: EF 18-29% by last echo in 3/17.  Status post Medtronic CRT-D upgrade. NYHA class II-III symptoms but remains inactive.  He is not volume overloaded.  Of note, creatinine has recently risen up to 2.9.   - Hold torsemide x 4 days, then decrease to 20 mg every other day.  BEMT again on Friday.  - Continue Coreg and spironolactone. - Stop Entresto for now with rise in creatinine.  2. CAD: s/p CABG.  No  recent chest pain.  Last cath in 3/12 with patent grafts.  - Continue ASA 81. - He is back on Crestor, good lipids in 6/17.     3. Hyperlipidemia: On Crestor, good lipids 6/17.  4. OSA: Severe OSA, on CPAP.  5. CKD: Creatinine up to 2.9, med changes as above and repeat BMET in 1 week.   6. PAD: Claudication improved, no pedal ulcerations or rest pain.  Not  candidate for cilostazol given CHF.   7. HTN: BP controlled.   8. Peripheral neuropathy: Painful, related to type II diabetes. I will let him try low dose Lyrica (renally-adjusted).   Follow up in 2 wks.     [name]   03/25/2016

## 2016-03-28 ENCOUNTER — Ambulatory Visit (HOSPITAL_COMMUNITY)
Admission: RE | Admit: 2016-03-28 | Discharge: 2016-03-28 | Disposition: A | Discharge status: Home / Self Care | Payer: PPO | Source: Ambulatory Visit | Attending: Internal Medicine | Admitting: Internal Medicine

## 2016-03-28 DIAGNOSIS — I5022 Chronic systolic (congestive) heart failure: Secondary | ICD-10-CM | POA: Insufficient documentation

## 2016-03-28 LAB — BASIC METABOLIC PANEL
ANION GAP: 7 (ref 5–15)
BUN: 24 mg/dL — ABNORMAL HIGH (ref 6–20)
CALCIUM: 9.2 mg/dL (ref 8.9–10.3)
CO2: 24 mmol/L (ref 22–32)
Chloride: 105 mmol/L (ref 101–111)
Creatinine, Ser: 2.22 mg/dL — ABNORMAL HIGH (ref 0.61–1.24)
GFR, EST AFRICAN AMERICAN: 31 mL/min — AB (ref >60)
GFR, EST NON AFRICAN AMERICAN: 26 mL/min — AB (ref >60)
GLUCOSE: 130 mg/dL — AB (ref 65–99)
Potassium: 4.4 mmol/L (ref 3.5–5.1)
SODIUM: 136 mmol/L (ref 135–145)

## 2016-04-01 ENCOUNTER — Encounter: Payer: PPO | Admitting: Family Medicine

## 2016-04-03 ENCOUNTER — Other Ambulatory Visit: Payer: Self-pay | Admitting: Family Medicine

## 2016-04-03 NOTE — Telephone Encounter (Signed)
Electronic refill request. Last Filled:    30 tablet 1 11/26/2015  CPE scheduled 04/08/16   Please advise.

## 2016-04-03 NOTE — Telephone Encounter (Signed)
Please call in.  Thanks.   

## 2016-04-04 NOTE — Telephone Encounter (Signed)
Medication phoned to pharmacy.  

## 2016-04-08 ENCOUNTER — Encounter: Payer: Self-pay | Admitting: Family Medicine

## 2016-04-08 ENCOUNTER — Ambulatory Visit (INDEPENDENT_AMBULATORY_CARE_PROVIDER_SITE_OTHER): Payer: PPO | Admitting: Family Medicine

## 2016-04-08 VITALS — BP 118/66 | HR 105 | Temp 98.8°F | Ht 69.0 in | Wt 238.5 lb

## 2016-04-08 DIAGNOSIS — R21 Rash and other nonspecific skin eruption: Secondary | ICD-10-CM

## 2016-04-08 DIAGNOSIS — I5022 Chronic systolic (congestive) heart failure: Secondary | ICD-10-CM | POA: Diagnosis not present

## 2016-04-08 DIAGNOSIS — R0602 Shortness of breath: Secondary | ICD-10-CM

## 2016-04-08 MED ORDER — CEPHALEXIN 500 MG PO CAPS: 500.0000 mg | ORAL_CAPSULE | Freq: Four times a day (QID) | ORAL | 0 refills | Status: DC

## 2016-04-08 NOTE — Patient Instructions (Addendum)
Take torsemide today, tomorrow and Thursday.  Then go back to every other day dosing.  Go to the lab on the way out.  We'll contact you with your lab report. We'll be in touch.  I'll check with cardiology.  Use topical hydrocortisone on the rash and start keflex in the meantime.  Take care.  Glad to see you.

## 2016-04-08 NOTE — Progress Notes (Signed)
Pre visit review using our clinic review tool, if applicable. No additional management support is needed unless otherwise documented below in the visit note. 

## 2016-04-08 NOTE — Progress Notes (Signed)
Patient came in for general follow-up but that was tabled due to his more recent issues with heart failure.  He has been getting SOB laying down. Weight is up.   Cr prev up, then decreased in the meantime with med changes.  Due for follow-up today. Noted that he is on less diuretics now. More bilateral lower extremity swelling in the meantime.  His functional status has declined since the last time I saw him. Not able to walk well at this point.  He is in a wheelchair today in this the first time I have ever seen him like that.  He has some occasional cough. No fevers.No BM in the last 4 days, possibly a few days longer.  Dec PO intake in the meantime.    PMH and SH reviewed  ROS: Per HPI unless specifically indicated in ROS section   Meds, vitals, and allergies reviewed.   GEN: nad, alert and oriented, stoic, in wheelchair.  HEENT: mucous membranes moist NECK: supple w/o LA CV: rrr PULM: ctab superiorly, but decreased breath sounds bilaterally in the bases, no inc wob, no wheeze.  ABD: soft, +bs EXT: 1+BLE edema SKIN: He has acute rash around the mouth or he has been shaving. It looks like he got razor burn with irritation with some focal areas of superficial infection. No fluctuant mass. No abscess.

## 2016-04-09 ENCOUNTER — Encounter (HOSPITAL_COMMUNITY): Payer: PPO

## 2016-04-09 DIAGNOSIS — E1122 Type 2 diabetes mellitus with diabetic chronic kidney disease: Secondary | ICD-10-CM | POA: Diagnosis not present

## 2016-04-09 DIAGNOSIS — I129 Hypertensive chronic kidney disease with stage 1 through stage 4 chronic kidney disease, or unspecified chronic kidney disease: Secondary | ICD-10-CM | POA: Diagnosis not present

## 2016-04-09 DIAGNOSIS — N179 Acute kidney failure, unspecified: Secondary | ICD-10-CM | POA: Diagnosis not present

## 2016-04-09 DIAGNOSIS — R21 Rash and other nonspecific skin eruption: Secondary | ICD-10-CM | POA: Insufficient documentation

## 2016-04-09 DIAGNOSIS — N183 Chronic kidney disease, stage 3 (moderate): Secondary | ICD-10-CM | POA: Diagnosis not present

## 2016-04-09 LAB — BASIC METABOLIC PANEL
BUN: 15 mg/dL (ref 6–23)
CHLORIDE: 102 meq/L (ref 96–112)
CO2: 31 meq/L (ref 19–32)
CREATININE: 1.53 mg/dL — AB (ref 0.40–1.50)
Calcium: 9.8 mg/dL (ref 8.4–10.5)
GFR: 46.86 mL/min — ABNORMAL LOW (ref >60.00)
GLUCOSE: 120 mg/dL — AB (ref 70–99)
POTASSIUM: 4.4 meq/L (ref 3.5–5.1)
Sodium: 141 mEq/L (ref 135–145)

## 2016-04-09 LAB — BRAIN NATRIURETIC PEPTIDE: PRO B NATRI PEPTIDE: 511 pg/mL — AB (ref 0.0–100.0)

## 2016-04-09 NOTE — Assessment & Plan Note (Signed)
His creatinine was significantly elevated previously, his diuretics were decreased in the meantime appropriately, his creatinine improved some previously, due for recheck today. In the meantime his weight is back up and his breathing is worse. Increase torsemide in the meantime, go back to taking 1 tablet of torsemide daily for 3 days, then revert back to every other day dosing. See notes on labs in the meantime. I hope that his creatinine will be lower than previous, but I expect his BNP to be elevated from previous. I discussed with him in general about his functional status. He has declined overall. This does not take into account his labs, even if his labs look normal his functional status is worse and I talked him about his goals of care. He was not specific about his goals this point, I asked him to consider this.   In the meantime, he has follow-up with nephrology pending. I appreciate nephrology input.  At this point still okay for outpatient follow-up, but if he worsens in gets more dyspneic I want him to go to the emergency room.  Routed cardiology in the meantime, with appreciation.  >25 minutes spent in face to face time with patient, >50% spent in counselling or coordination of care.

## 2016-04-09 NOTE — Progress Notes (Signed)
Heather, can we get [name] [name] in to see either me or PA/NP in the next week for worsening CHF? Thanks.

## 2016-04-09 NOTE — Assessment & Plan Note (Signed)
Looks to be superficially infected razor burn. Start Keflex and use topical hydrocortisone. Should resolve. Discussed with patient.

## 2016-04-14 ENCOUNTER — Other Ambulatory Visit: Payer: Self-pay | Admitting: Endocrinology

## 2016-04-14 ENCOUNTER — Ambulatory Visit (HOSPITAL_COMMUNITY)
Admission: RE | Admit: 2016-04-14 | Discharge: 2016-04-14 | Disposition: A | Discharge status: Home / Self Care | Payer: PPO | Source: Ambulatory Visit | Attending: Internal Medicine | Admitting: Internal Medicine

## 2016-04-14 VITALS — BP 136/70 | HR 74 | Wt 239.4 lb

## 2016-04-14 DIAGNOSIS — E1122 Type 2 diabetes mellitus with diabetic chronic kidney disease: Secondary | ICD-10-CM | POA: Diagnosis not present

## 2016-04-14 DIAGNOSIS — G4733 Obstructive sleep apnea (adult) (pediatric): Secondary | ICD-10-CM | POA: Diagnosis not present

## 2016-04-14 DIAGNOSIS — E1142 Type 2 diabetes mellitus with diabetic polyneuropathy: Secondary | ICD-10-CM | POA: Diagnosis not present

## 2016-04-14 DIAGNOSIS — Z6835 Body mass index (BMI) 35.0-35.9, adult: Secondary | ICD-10-CM | POA: Diagnosis not present

## 2016-04-14 DIAGNOSIS — Z79899 Other long term (current) drug therapy: Secondary | ICD-10-CM | POA: Insufficient documentation

## 2016-04-14 DIAGNOSIS — I2510 Atherosclerotic heart disease of native coronary artery without angina pectoris: Secondary | ICD-10-CM | POA: Diagnosis not present

## 2016-04-14 DIAGNOSIS — I10 Essential (primary) hypertension: Secondary | ICD-10-CM

## 2016-04-14 DIAGNOSIS — I5022 Chronic systolic (congestive) heart failure: Secondary | ICD-10-CM | POA: Diagnosis not present

## 2016-04-14 DIAGNOSIS — I130 Hypertensive heart and chronic kidney disease with heart failure and stage 1 through stage 4 chronic kidney disease, or unspecified chronic kidney disease: Secondary | ICD-10-CM | POA: Insufficient documentation

## 2016-04-14 DIAGNOSIS — Z794 Long term (current) use of insulin: Secondary | ICD-10-CM | POA: Insufficient documentation

## 2016-04-14 DIAGNOSIS — N183 Chronic kidney disease, stage 3 unspecified: Secondary | ICD-10-CM

## 2016-04-14 DIAGNOSIS — E1151 Type 2 diabetes mellitus with diabetic peripheral angiopathy without gangrene: Secondary | ICD-10-CM | POA: Diagnosis not present

## 2016-04-14 DIAGNOSIS — E785 Hyperlipidemia, unspecified: Secondary | ICD-10-CM | POA: Diagnosis not present

## 2016-04-14 DIAGNOSIS — E669 Obesity, unspecified: Secondary | ICD-10-CM | POA: Insufficient documentation

## 2016-04-14 DIAGNOSIS — Z951 Presence of aortocoronary bypass graft: Secondary | ICD-10-CM | POA: Insufficient documentation

## 2016-04-14 DIAGNOSIS — I447 Left bundle-branch block, unspecified: Secondary | ICD-10-CM | POA: Diagnosis not present

## 2016-04-14 DIAGNOSIS — Z7982 Long term (current) use of aspirin: Secondary | ICD-10-CM | POA: Insufficient documentation

## 2016-04-14 DIAGNOSIS — J449 Chronic obstructive pulmonary disease, unspecified: Secondary | ICD-10-CM | POA: Diagnosis not present

## 2016-04-14 DIAGNOSIS — I739 Peripheral vascular disease, unspecified: Secondary | ICD-10-CM

## 2016-04-14 DIAGNOSIS — Z9889 Other specified postprocedural states: Secondary | ICD-10-CM | POA: Diagnosis not present

## 2016-04-14 DIAGNOSIS — I255 Ischemic cardiomyopathy: Secondary | ICD-10-CM | POA: Insufficient documentation

## 2016-04-14 LAB — BASIC METABOLIC PANEL
Anion gap: 9 (ref 5–15)
BUN: 18 mg/dL (ref 6–20)
CHLORIDE: 102 mmol/L (ref 101–111)
CO2: 26 mmol/L (ref 22–32)
CREATININE: 1.76 mg/dL — AB (ref 0.61–1.24)
Calcium: 9.6 mg/dL (ref 8.9–10.3)
GFR calc Af Amer: 41 mL/min — ABNORMAL LOW (ref >60)
GFR calc non Af Amer: 35 mL/min — ABNORMAL LOW (ref >60)
Glucose, Bld: 156 mg/dL — ABNORMAL HIGH (ref 65–99)
POTASSIUM: 4.5 mmol/L (ref 3.5–5.1)
SODIUM: 137 mmol/L (ref 135–145)

## 2016-04-14 LAB — BRAIN NATRIURETIC PEPTIDE: B NATRIURETIC PEPTIDE 5: 345.2 pg/mL — AB (ref 0.0–100.0)

## 2016-04-14 MED ORDER — TORSEMIDE 20 MG PO TABS: 20.0000 mg | ORAL_TABLET | ORAL | 3 refills | Status: AC

## 2016-04-14 NOTE — Patient Instructions (Signed)
Routine lab work today. Will notify you of abnormal results, otherwise no news is good news!  RESTART Torsemide 20 mg tablet once EVERY OTHER DAY.  Follow up 1 month with Dr. Aundra Dubin.  Do the following things EVERYDAY: 1) Weigh yourself in the morning before breakfast. Write it down and keep it in a log. 2) Take your medicines as prescribed 3) Eat low salt foods-Limit salt (sodium) to 2000 mg per day.  4) Stay as active as you can everyday 5) Limit all fluids for the day to less than 2 liters

## 2016-04-14 NOTE — Progress Notes (Signed)
Patient ID: [name] [name], male   DOB: [birth year], 82 y.o.   MRN: [id number] PCP: Dr. Damita Dunnings Cardiology: Dr Aundra Dubin  82 yo with history of CAD s/p CABG, ischemic cardiomyopathy/systolic CHF, LBBB, COPD, and CKD presents for cardiology followup.  He had CABG in 2008.  Last cath in 3/12 showed patent grafts.  EF was 20-25%.  He was admitted in 10/13 with acute on chronic systolic CHF.  He was volume overloaded. He had a mild TnI elevation with no chest pain.  He was diuresed and discharged.  After that, his ICD was upgraded to a Medtronic CRT-D system.  EF improved to 35-40% on echo in 10/14. EF remained 35-40% on 3/17 echo.   He returns today for follow up with worsening CHF. Labs 04/08/16 with improvement of Creatinine, but noted to be volume overloaded. At our last visit, weight was down 10 lbs and creatinine up, so torsemide held for 4 days and then decreased to torsemide  Weight up 8 lbs from that visit. SOB with minimal exertion.  Was told to take torsemide daily for 3 days, but instead has for some reason held it for four days.  He states he is going to start back on every other day tomorrow. He is very limited by knee pain, but wife thinks he is more SOB. Doesn't weight at home. Sleeps upright in a recliner.   Optivol: Gradual uptrend of fluid over past 3-4 weeks. Thoracic impedence below threshold.  Barely any patient activity.   Labs (10/13): K 3.6 => 4.2, creatinine 1.79 => 2.1 => 2.0, HDL 45, LDL 157, BNP 219 Labs (12/13): K 4, creatinine 1.9, BNP 209 Labs (1/14): LDL 61, HDL 46, LFTs normal Labs (2/14): BNP 199 Labs (3/14): K 4.1, creatinine 2.0 Labs (5/14): K 4.9, creatinine 3.0 Labs (8/14): K 4.2, creatinine 1.84 Labs (10/14): K 3.8, creatinine 1.7 Labs (2/15): K 3.6, creatinine 1.6, LDL 84, HDL 38 Labs (11/16): LDL 180, HDL 34 Labs (3/17): K 4.2 => 4, creatinine 1.63 => 2.16 => 2.6 Labs (4/17): K 4.3, creatinine 2.14 Labs (08/2015) : K 4.1 Creatinine 2.08 Labs (6/17): LDL 60, HDL  40 Labs (7/17): K 4.5, creatinine 1.9 Labs (9/17): HCT 30.6 Labs (12/17): K 4.1, creatinine 2.32 => 2.9  PMH: 1. CAD: S/p CABG 2008.  Cath 3/12 with totally occluded mLAD and RCA, 80% LCx stenosis, patent LIMA-LAD, patent SVG-OM, 40% stenosis in SVG-PDA.   2. Ischemic cardiomyopathy: Echo (10/13) with severely dilated LV, moderate LVH, EF 20-25%, mild MR.  Medtronic CRT-D device 10/13.  Echo (10/14) with severely dilated LV, mild LVH, EF 35-40% (improved).  - Echo (3/17) with EF 35-40%, moderate LVH, decreased RV systolic function.  3. LBBB 4. COPD 5. Type II diabetes with peripheral neuropathy.  6. Hyperlipidemia 7. HTN 8. CKD: Stage III.  9. Obesity 10. OA: knees. 11. PTSD 12. H/o ETOH abuse 13. OSA: Severe by sleep study, on CPAP.  14. PAD: ABIs (5/14) with 0.75 right, 0.80 left.   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   FH: Father with MI at 48  ROS: All systems reviewed and negative except as per HPI.   Current Outpatient Prescriptions  Medication Sig Dispense Refill  . amLODipine (NORVASC) 10 MG tablet TAKE 1 TABLET BY MOUTH DAILY 30 tablet 11  . aspirin 81 MG tablet Take 81 mg by mouth every other day.     Marland Kitchen Besifloxacin HCl (BESIVANCE) 0.6 % SUSP Apply 1 drop to eye  3 (three) times daily.    . Bromfenac Sodium (PROLENSA) 0.07 % SOLN Apply 1 drop to eye at bedtime.    . carvedilol (COREG) 25 MG tablet TAKE 1 & 1/2 TABLETS BY MOUTH TWICE A DAY 90 tablet 3  . cephALEXin (KEFLEX) 500 MG capsule Take 1 capsule (500 mg total) by mouth 4 (four) times daily. 28 capsule 0  . diazepam (VALIUM) 10 MG tablet TAKE 1/2 TO 1 TABLET BY MOUTH AT BEDTIMEAS NEEDED FOR ANXIETY OR RINGING IN EARS 30 tablet 1  . Difluprednate (DUREZOL) 0.05 % EMUL Apply 1 drop to eye 3 (three) times daily.    . ferrous sulfate ([employer] IRON) 325 (65 FE) MG tablet Take 1 tablet (325 mg total) by mouth daily with breakfast. 90 tablet 3  . glucose blood  (ONETOUCH VERIO) test strip Use as instructed to check blood sugar 3 times per day dx code E11.29 100 each 3  . Insulin Disposable Pump (V-GO 20) KIT USE DAILY AS DIRECTED 1 kit 5  . Insulin Pen Needle (PEN NEEDLES) 31G X 5 MM MISC Inject 15 Units as directed 2 (two) times daily. Use pen needles for pt insulin pen. Dx E11.29 100 each 5  . nitroGLYCERIN (NITROSTAT) 0.4 MG SL tablet Place 1 tablet (0.4 mg total) under the tongue every 5 (five) minutes as needed. For chest pain 25 tablet 11  . NOVOLOG 100 UNIT/ML injection USE MAXIMUM OF 56 UNITS PER DAY WITH V-GO PUMP 20 mL 2  . NOVOLOG FLEXPEN 100 UNIT/ML FlexPen 56 Units. Use max 56 units per day with V-go    . ONETOUCH DELICA LANCETS FINE MISC Use to check blood sugar 3 times per day dx code 250.42 100 each 3  . pregabalin (LYRICA) 25 MG capsule Take 1 capsule (25 mg total) by mouth 2 (two) times daily. 30 capsule 3  . rosuvastatin (CRESTOR) 40 MG tablet Take 1 tablet (40 mg total) by mouth daily. 90 tablet 3  . spironolactone (ALDACTONE) 25 MG tablet Take 0.5 tablets (12.5 mg total) by mouth daily. 45 tablet 3  . torsemide (DEMADEX) 20 MG tablet Take 1 tablet (20 mg total) by mouth every other day. 15 tablet 3  . VICTOZA 18 MG/3ML SOPN Inject 0.2 mLs (1.2 mg total) into the skin daily. Inject once daily at the same time 2 pen 3   No current facility-administered medications for this encounter.     BP 136/70 (BP Location: Left Arm, Patient Position: Sitting, Cuff Size: Normal)   Pulse 74   Wt 239 lb 6.4 oz (108.6 kg)   SpO2 91%   BMI 35.35 kg/m    Wt Readings from Last 3 Encounters:  04/14/16 239 lb 6.4 oz (108.6 kg)  04/08/16 238 lb 8 oz (108.2 kg)  03/25/16 231 lb 1.9 oz (104.8 kg)     General: Elderly and fatigued appearing, Obese Neck: Thick, JVP 8-9 cm. No thyromegaly or nodule noted.  Lungs: CTAB, normal effort.  CV: Nondisplaced PMI.  Heart regular S1/S2, no S3/S4, no murmur.  No edema.  No carotid bruit.  Difficult to palpate  PT pulses.   Abdomen: Soft, NT, mildly distended, no HSM. No bruits or masses. +BS  Neurologic: Alert and oriented x 3.  Psych: Normal affect. Extremities: No clubbing or cyanosis.   Assessment/Plan: 1. Chronic systolic CHF: EF 36-64% by last echo in 3/17.  Status post Medtronic CRT-D upgrade. NYHA class II-III symptoms but remains inactive.  - Volume status mildly elevated in setting  of getting his torsemide instruction confused.  - Resume torsemide 20 mg every other day starting tomorrow.  BMET/BNP today.  - Continue coreg 37.5 mg BID.  - Continue spironolactone 12.5 mg daily.  - Off entresto with AKI. Will keep off for now with on going adjustment of diuretics.  2. CAD: s/p CABG.  - No chest pain. Last cath in 3/12 with patent grafts.  - Continue ASA 81. - He is back on Crestor, good lipids in 6/17.     3. Hyperlipidemia:  - Stable on labs 09/2015.   4. OSA: Severe OSA, on CPAP.  5. CKD III - BMET and BNP today.  - Up and down recently with med adjustments.  6. PAD: Claudication improved, no pedal ulcerations or rest pain.  Not candidate for cilostazol given CHF.   7. HTN:  - Keeping off entresto for now with recent AKI.    8. Peripheral neuropathy 2/2 Type II diabetes  - Continue low dose Lyrica (renally-adjusted).   BMET/BNP today. Follow up in 2-3 weeks with Dr. Aundra Dubin with tenuous status.   Trying to explain sliding scale diuretics to patient and while sometimes adjustments are required when CHF and CKD are combined.    Shirley Friar, PA-C  04/14/2016   Total time spent > 25 minutes. Over half that spent discussing the above.

## 2016-04-21 ENCOUNTER — Other Ambulatory Visit: Payer: PPO

## 2016-04-21 ENCOUNTER — Other Ambulatory Visit (HOSPITAL_COMMUNITY): Payer: Self-pay | Admitting: Cardiology

## 2016-04-24 ENCOUNTER — Ambulatory Visit: Payer: PPO | Admitting: Endocrinology

## 2016-05-01 ENCOUNTER — Other Ambulatory Visit: Payer: Self-pay | Admitting: Internal Medicine

## 2016-05-01 ENCOUNTER — Encounter: Payer: Self-pay | Admitting: Internal Medicine

## 2016-05-02 ENCOUNTER — Other Ambulatory Visit: Payer: PPO

## 2016-05-05 ENCOUNTER — Other Ambulatory Visit: Payer: Self-pay | Admitting: Endocrinology

## 2016-05-05 ENCOUNTER — Other Ambulatory Visit: Payer: Self-pay | Admitting: Family Medicine

## 2016-05-05 ENCOUNTER — Other Ambulatory Visit: Payer: Self-pay | Admitting: Cardiology

## 2016-05-05 NOTE — Telephone Encounter (Signed)
Received refill electronically Last refill 04/03/16 #30/1 Last office visit 04/08/16

## 2016-05-05 NOTE — Telephone Encounter (Signed)
Please clarify with patient. He should have a refill left. Thanks.

## 2016-05-06 NOTE — Telephone Encounter (Signed)
Spoke to patient and was advised that he thinks that he does have a refill left. Patient stated that he will contact the pharmacy and let them know this. Patient stated that he will call back if there is any problems with the refill.

## 2016-05-06 NOTE — Telephone Encounter (Signed)
Thanks

## 2016-05-07 ENCOUNTER — Ambulatory Visit: Payer: PPO | Admitting: Endocrinology

## 2016-05-07 ENCOUNTER — Other Ambulatory Visit (INDEPENDENT_AMBULATORY_CARE_PROVIDER_SITE_OTHER): Payer: PPO

## 2016-05-07 DIAGNOSIS — E1165 Type 2 diabetes mellitus with hyperglycemia: Secondary | ICD-10-CM

## 2016-05-07 DIAGNOSIS — Z794 Long term (current) use of insulin: Secondary | ICD-10-CM

## 2016-05-07 LAB — COMPREHENSIVE METABOLIC PANEL
ALBUMIN: 3.6 g/dL (ref 3.5–5.2)
ALK PHOS: 45 U/L (ref 39–117)
ALT: 10 U/L (ref 0–53)
AST: 8 U/L (ref 0–37)
BILIRUBIN TOTAL: 0.4 mg/dL (ref 0.2–1.2)
BUN: 21 mg/dL (ref 6–23)
CO2: 31 mEq/L (ref 19–32)
Calcium: 10.2 mg/dL (ref 8.4–10.5)
Chloride: 98 mEq/L (ref 96–112)
Creatinine, Ser: 1.56 mg/dL — ABNORMAL HIGH (ref 0.40–1.50)
GFR: 45.81 mL/min — AB (ref >60.00)
GLUCOSE: 166 mg/dL — AB (ref 70–99)
POTASSIUM: 4 meq/L (ref 3.5–5.1)
SODIUM: 137 meq/L (ref 135–145)
TOTAL PROTEIN: 7.1 g/dL (ref 6.0–8.3)

## 2016-05-07 LAB — MICROALBUMIN / CREATININE URINE RATIO
Creatinine,U: 62.4 mg/dL
MICROALB UR: 22.5 mg/dL — AB (ref 0.0–1.9)
Microalb Creat Ratio: 36 mg/g — ABNORMAL HIGH (ref 0.0–30.0)

## 2016-05-07 LAB — HEMOGLOBIN A1C: HEMOGLOBIN A1C: 7.5 % — AB (ref 4.6–6.5)

## 2016-05-12 ENCOUNTER — Ambulatory Visit: Payer: PPO | Admitting: Endocrinology

## 2016-05-12 ENCOUNTER — Telehealth: Payer: Self-pay

## 2016-05-12 NOTE — Telephone Encounter (Signed)
[name] [name] pts wife said pt last seen annual on 04/08/16. Pt can no longer push up by himself or walk on his own. Pt's wife left v/m requesting cb from Dr Damita Dunnings; pt needs hospital bed and w/c. Pt just seen recently.

## 2016-05-13 ENCOUNTER — Encounter (HOSPITAL_COMMUNITY): Payer: Self-pay | Admitting: *Deleted

## 2016-05-13 ENCOUNTER — Emergency Department (HOSPITAL_COMMUNITY): Payer: PPO

## 2016-05-13 ENCOUNTER — Inpatient Hospital Stay (HOSPITAL_COMMUNITY): Payer: PPO

## 2016-05-13 ENCOUNTER — Inpatient Hospital Stay (HOSPITAL_COMMUNITY)
Admission: EM | Admit: 2016-05-13 | Discharge: 2016-05-23 | DRG: 166 | Disposition: A | Discharge status: Home Health | Payer: PPO | Attending: Internal Medicine | Admitting: Internal Medicine

## 2016-05-13 DIAGNOSIS — J189 Pneumonia, unspecified organism: Secondary | ICD-10-CM | POA: Diagnosis not present

## 2016-05-13 DIAGNOSIS — Z7982 Long term (current) use of aspirin: Secondary | ICD-10-CM | POA: Diagnosis not present

## 2016-05-13 DIAGNOSIS — F419 Anxiety disorder, unspecified: Secondary | ICD-10-CM | POA: Diagnosis present

## 2016-05-13 DIAGNOSIS — J90 Pleural effusion, not elsewhere classified: Secondary | ICD-10-CM | POA: Diagnosis present

## 2016-05-13 DIAGNOSIS — J181 Lobar pneumonia, unspecified organism: Secondary | ICD-10-CM | POA: Diagnosis not present

## 2016-05-13 DIAGNOSIS — D649 Anemia, unspecified: Secondary | ICD-10-CM | POA: Diagnosis not present

## 2016-05-13 DIAGNOSIS — Z87891 Personal history of nicotine dependence: Secondary | ICD-10-CM

## 2016-05-13 DIAGNOSIS — R0600 Dyspnea, unspecified: Secondary | ICD-10-CM | POA: Diagnosis not present

## 2016-05-13 DIAGNOSIS — Z951 Presence of aortocoronary bypass graft: Secondary | ICD-10-CM

## 2016-05-13 DIAGNOSIS — C3431 Malignant neoplasm of lower lobe, right bronchus or lung: Secondary | ICD-10-CM | POA: Diagnosis not present

## 2016-05-13 DIAGNOSIS — R109 Unspecified abdominal pain: Secondary | ICD-10-CM

## 2016-05-13 DIAGNOSIS — H9192 Unspecified hearing loss, left ear: Secondary | ICD-10-CM | POA: Diagnosis present

## 2016-05-13 DIAGNOSIS — Z794 Long term (current) use of insulin: Secondary | ICD-10-CM

## 2016-05-13 DIAGNOSIS — Z95810 Presence of automatic (implantable) cardiac defibrillator: Secondary | ICD-10-CM | POA: Diagnosis not present

## 2016-05-13 DIAGNOSIS — N183 Chronic kidney disease, stage 3 unspecified: Secondary | ICD-10-CM

## 2016-05-13 DIAGNOSIS — K921 Melena: Secondary | ICD-10-CM | POA: Diagnosis not present

## 2016-05-13 DIAGNOSIS — R58 Hemorrhage, not elsewhere classified: Secondary | ICD-10-CM | POA: Diagnosis not present

## 2016-05-13 DIAGNOSIS — Z882 Allergy status to sulfonamides status: Secondary | ICD-10-CM

## 2016-05-13 DIAGNOSIS — N179 Acute kidney failure, unspecified: Secondary | ICD-10-CM | POA: Diagnosis not present

## 2016-05-13 DIAGNOSIS — Z79899 Other long term (current) drug therapy: Secondary | ICD-10-CM

## 2016-05-13 DIAGNOSIS — R1310 Dysphagia, unspecified: Secondary | ICD-10-CM | POA: Diagnosis not present

## 2016-05-13 DIAGNOSIS — I255 Ischemic cardiomyopathy: Secondary | ICD-10-CM | POA: Diagnosis not present

## 2016-05-13 DIAGNOSIS — J449 Chronic obstructive pulmonary disease, unspecified: Secondary | ICD-10-CM | POA: Diagnosis present

## 2016-05-13 DIAGNOSIS — D509 Iron deficiency anemia, unspecified: Secondary | ICD-10-CM | POA: Diagnosis not present

## 2016-05-13 DIAGNOSIS — I130 Hypertensive heart and chronic kidney disease with heart failure and stage 1 through stage 4 chronic kidney disease, or unspecified chronic kidney disease: Secondary | ICD-10-CM | POA: Diagnosis present

## 2016-05-13 DIAGNOSIS — R100 Acute abdomen: Secondary | ICD-10-CM | POA: Diagnosis not present

## 2016-05-13 DIAGNOSIS — F4310 Post-traumatic stress disorder, unspecified: Secondary | ICD-10-CM | POA: Diagnosis present

## 2016-05-13 DIAGNOSIS — J939 Pneumothorax, unspecified: Secondary | ICD-10-CM | POA: Diagnosis not present

## 2016-05-13 DIAGNOSIS — R0902 Hypoxemia: Secondary | ICD-10-CM

## 2016-05-13 DIAGNOSIS — R918 Other nonspecific abnormal finding of lung field: Secondary | ICD-10-CM | POA: Diagnosis not present

## 2016-05-13 DIAGNOSIS — E785 Hyperlipidemia, unspecified: Secondary | ICD-10-CM | POA: Diagnosis not present

## 2016-05-13 DIAGNOSIS — E669 Obesity, unspecified: Secondary | ICD-10-CM | POA: Diagnosis present

## 2016-05-13 DIAGNOSIS — C801 Malignant (primary) neoplasm, unspecified: Secondary | ICD-10-CM | POA: Diagnosis not present

## 2016-05-13 DIAGNOSIS — R05 Cough: Secondary | ICD-10-CM | POA: Diagnosis not present

## 2016-05-13 DIAGNOSIS — I509 Heart failure, unspecified: Secondary | ICD-10-CM | POA: Diagnosis not present

## 2016-05-13 DIAGNOSIS — R4182 Altered mental status, unspecified: Secondary | ICD-10-CM | POA: Diagnosis not present

## 2016-05-13 DIAGNOSIS — I5022 Chronic systolic (congestive) heart failure: Secondary | ICD-10-CM | POA: Diagnosis not present

## 2016-05-13 DIAGNOSIS — K5641 Fecal impaction: Secondary | ICD-10-CM | POA: Diagnosis present

## 2016-05-13 DIAGNOSIS — J9601 Acute respiratory failure with hypoxia: Secondary | ICD-10-CM | POA: Diagnosis present

## 2016-05-13 DIAGNOSIS — J95811 Postprocedural pneumothorax: Secondary | ICD-10-CM | POA: Diagnosis not present

## 2016-05-13 DIAGNOSIS — R846 Abnormal cytological findings in specimens from respiratory organs and thorax: Secondary | ICD-10-CM | POA: Diagnosis not present

## 2016-05-13 DIAGNOSIS — E1122 Type 2 diabetes mellitus with diabetic chronic kidney disease: Secondary | ICD-10-CM | POA: Diagnosis present

## 2016-05-13 DIAGNOSIS — K5900 Constipation, unspecified: Secondary | ICD-10-CM | POA: Diagnosis not present

## 2016-05-13 DIAGNOSIS — R194 Change in bowel habit: Secondary | ICD-10-CM | POA: Diagnosis not present

## 2016-05-13 DIAGNOSIS — Z9889 Other specified postprocedural states: Secondary | ICD-10-CM

## 2016-05-13 DIAGNOSIS — G4733 Obstructive sleep apnea (adult) (pediatric): Secondary | ICD-10-CM | POA: Diagnosis not present

## 2016-05-13 DIAGNOSIS — I2510 Atherosclerotic heart disease of native coronary artery without angina pectoris: Secondary | ICD-10-CM | POA: Diagnosis present

## 2016-05-13 DIAGNOSIS — N50819 Testicular pain, unspecified: Secondary | ICD-10-CM | POA: Diagnosis not present

## 2016-05-13 DIAGNOSIS — N5082 Scrotal pain: Secondary | ICD-10-CM | POA: Diagnosis not present

## 2016-05-13 DIAGNOSIS — J948 Other specified pleural conditions: Secondary | ICD-10-CM | POA: Diagnosis not present

## 2016-05-13 DIAGNOSIS — Z6837 Body mass index (BMI) 37.0-37.9, adult: Secondary | ICD-10-CM

## 2016-05-13 DIAGNOSIS — D638 Anemia in other chronic diseases classified elsewhere: Secondary | ICD-10-CM | POA: Diagnosis present

## 2016-05-13 DIAGNOSIS — J690 Pneumonitis due to inhalation of food and vomit: Secondary | ICD-10-CM | POA: Diagnosis present

## 2016-05-13 DIAGNOSIS — I10 Essential (primary) hypertension: Secondary | ICD-10-CM

## 2016-05-13 DIAGNOSIS — R339 Retention of urine, unspecified: Secondary | ICD-10-CM | POA: Diagnosis present

## 2016-05-13 DIAGNOSIS — R634 Abnormal weight loss: Secondary | ICD-10-CM | POA: Diagnosis present

## 2016-05-13 LAB — HEPATIC FUNCTION PANEL
ALT: 10 U/L — AB (ref 17–63)
AST: 14 U/L — AB (ref 15–41)
Albumin: 3.2 g/dL — ABNORMAL LOW (ref 3.5–5.0)
Alkaline Phosphatase: 44 U/L (ref 38–126)
Bilirubin, Direct: 0.1 mg/dL (ref 0.1–0.5)
Indirect Bilirubin: 0.4 mg/dL (ref 0.3–0.9)
TOTAL PROTEIN: 7.1 g/dL (ref 6.5–8.1)
Total Bilirubin: 0.5 mg/dL (ref 0.3–1.2)

## 2016-05-13 LAB — FOLATE: FOLATE: 14.8 ng/mL (ref >5.9)

## 2016-05-13 LAB — IRON AND TIBC
Iron: 15 ug/dL — ABNORMAL LOW (ref 45–182)
Saturation Ratios: 6 % — ABNORMAL LOW (ref 17.9–39.5)
TIBC: 234 ug/dL — ABNORMAL LOW (ref 250–450)
UIBC: 219 ug/dL

## 2016-05-13 LAB — URINALYSIS, ROUTINE W REFLEX MICROSCOPIC
Bilirubin Urine: NEGATIVE
GLUCOSE, UA: NEGATIVE mg/dL
Hgb urine dipstick: NEGATIVE
Ketones, ur: NEGATIVE mg/dL
Leukocytes, UA: NEGATIVE
Nitrite: NEGATIVE
PH: 6 (ref 5.0–8.0)
Protein, ur: 100 mg/dL — AB
Specific Gravity, Urine: 1.013 (ref 1.005–1.030)

## 2016-05-13 LAB — CBC WITH DIFFERENTIAL/PLATELET
Basophils Absolute: 0 10*3/uL (ref 0.0–0.1)
Basophils Relative: 0 %
Eosinophils Absolute: 0 10*3/uL (ref 0.0–0.7)
Eosinophils Relative: 0 %
HEMATOCRIT: 30 % — AB (ref 39.0–52.0)
HEMOGLOBIN: 9.5 g/dL — AB (ref 13.0–17.0)
LYMPHS ABS: 0.9 10*3/uL (ref 0.7–4.0)
LYMPHS PCT: 7 %
MCH: 27.9 pg (ref 26.0–34.0)
MCHC: 31.7 g/dL (ref 30.0–36.0)
MCV: 88.2 fL (ref 78.0–100.0)
MONOS PCT: 7 %
Monocytes Absolute: 0.9 10*3/uL (ref 0.1–1.0)
NEUTROS ABS: 11.1 10*3/uL — AB (ref 1.7–7.7)
NEUTROS PCT: 86 %
Platelets: 268 10*3/uL (ref 150–400)
RBC: 3.4 MIL/uL — ABNORMAL LOW (ref 4.22–5.81)
RDW: 15.2 % (ref 11.5–15.5)
WBC: 12.9 10*3/uL — ABNORMAL HIGH (ref 4.0–10.5)

## 2016-05-13 LAB — BASIC METABOLIC PANEL
Anion gap: 9 (ref 5–15)
BUN: 34 mg/dL — AB (ref 6–20)
CHLORIDE: 101 mmol/L (ref 101–111)
CO2: 28 mmol/L (ref 22–32)
CREATININE: 2.17 mg/dL — AB (ref 0.61–1.24)
Calcium: 9.8 mg/dL (ref 8.9–10.3)
GFR calc non Af Amer: 27 mL/min — ABNORMAL LOW (ref >60)
GFR, EST AFRICAN AMERICAN: 32 mL/min — AB (ref >60)
GLUCOSE: 176 mg/dL — AB (ref 65–99)
Potassium: 4.3 mmol/L (ref 3.5–5.1)
Sodium: 138 mmol/L (ref 135–145)

## 2016-05-13 LAB — BRAIN NATRIURETIC PEPTIDE: B Natriuretic Peptide: 488 pg/mL — ABNORMAL HIGH (ref 0.0–100.0)

## 2016-05-13 LAB — RETICULOCYTES
RBC.: 3.31 MIL/uL — AB (ref 4.22–5.81)
RETIC COUNT ABSOLUTE: 49.7 10*3/uL (ref 19.0–186.0)
Retic Ct Pct: 1.5 % (ref 0.4–3.1)

## 2016-05-13 LAB — I-STAT TROPONIN, ED: Troponin i, poc: 0 ng/mL (ref 0.00–0.08)

## 2016-05-13 LAB — FERRITIN: Ferritin: 366 ng/mL — ABNORMAL HIGH (ref 24–336)

## 2016-05-13 LAB — GLUCOSE, CAPILLARY: Glucose-Capillary: 146 mg/dL — ABNORMAL HIGH (ref 65–99)

## 2016-05-13 LAB — VITAMIN B12: Vitamin B-12: 328 pg/mL (ref 180–914)

## 2016-05-13 LAB — MAGNESIUM: Magnesium: 2.1 mg/dL (ref 1.7–2.4)

## 2016-05-13 MED ORDER — DEXTROSE 5 % IV SOLN
1.0000 g | Freq: Once | INTRAVENOUS | Status: AC
  Administered 2016-05-13: 1 g via INTRAVENOUS
  Filled 2016-05-13: qty 10

## 2016-05-13 MED ORDER — IOPAMIDOL (ISOVUE-300) INJECTION 61%
INTRAVENOUS | Status: AC
  Filled 2016-05-13: qty 30

## 2016-05-13 MED ORDER — SORBITOL 70 % SOLN
960.0000 mL | TOPICAL_OIL | Freq: Two times a day (BID) | ORAL | Status: AC
  Administered 2016-05-14: 960 mL via RECTAL
  Filled 2016-05-13 (×2): qty 240

## 2016-05-13 MED ORDER — DEXTROSE 5 % IV SOLN
1.0000 g | INTRAVENOUS | Status: AC
  Administered 2016-05-14 – 2016-05-20 (×7): 1 g via INTRAVENOUS
  Filled 2016-05-13 (×8): qty 10

## 2016-05-13 MED ORDER — DIAZEPAM 5 MG PO TABS
5.0000 mg | ORAL_TABLET | Freq: Every evening | ORAL | Status: DC | PRN
  Administered 2016-05-13: 5 mg via ORAL
  Filled 2016-05-13: qty 1

## 2016-05-13 MED ORDER — INSULIN ASPART 100 UNIT/ML ~~LOC~~ SOLN
0.0000 [IU] | Freq: Three times a day (TID) | SUBCUTANEOUS | Status: DC
  Administered 2016-05-14: 1 [IU] via SUBCUTANEOUS
  Administered 2016-05-14: 2 [IU] via SUBCUTANEOUS
  Administered 2016-05-15: 3 [IU] via SUBCUTANEOUS
  Administered 2016-05-15: 5 [IU] via SUBCUTANEOUS
  Administered 2016-05-16: 2 [IU] via SUBCUTANEOUS
  Administered 2016-05-16: 1 [IU] via SUBCUTANEOUS
  Administered 2016-05-16: 2 [IU] via SUBCUTANEOUS
  Administered 2016-05-17 (×2): 3 [IU] via SUBCUTANEOUS
  Administered 2016-05-17: 5 [IU] via SUBCUTANEOUS
  Administered 2016-05-18: 2 [IU] via SUBCUTANEOUS
  Administered 2016-05-18: 3 [IU] via SUBCUTANEOUS
  Administered 2016-05-18: 1 [IU] via SUBCUTANEOUS
  Administered 2016-05-19: 2 [IU] via SUBCUTANEOUS
  Administered 2016-05-19: 3 [IU] via SUBCUTANEOUS
  Administered 2016-05-19: 2 [IU] via SUBCUTANEOUS
  Administered 2016-05-20: 7 [IU] via SUBCUTANEOUS
  Administered 2016-05-20 – 2016-05-21 (×3): 2 [IU] via SUBCUTANEOUS
  Administered 2016-05-21: 1 [IU] via SUBCUTANEOUS
  Administered 2016-05-21: 2 [IU] via SUBCUTANEOUS
  Administered 2016-05-22: 1 [IU] via SUBCUTANEOUS
  Administered 2016-05-22: 3 [IU] via SUBCUTANEOUS
  Administered 2016-05-22: 1 [IU] via SUBCUTANEOUS
  Administered 2016-05-23 (×3): 2 [IU] via SUBCUTANEOUS

## 2016-05-13 MED ORDER — ROSUVASTATIN CALCIUM 20 MG PO TABS
40.0000 mg | ORAL_TABLET | Freq: Every day | ORAL | Status: DC
  Administered 2016-05-14 – 2016-05-23 (×10): 40 mg via ORAL
  Filled 2016-05-13 (×10): qty 2

## 2016-05-13 MED ORDER — PREGABALIN 25 MG PO CAPS
25.0000 mg | ORAL_CAPSULE | Freq: Two times a day (BID) | ORAL | Status: DC
  Administered 2016-05-13 – 2016-05-17 (×7): 25 mg via ORAL
  Filled 2016-05-13 (×8): qty 1

## 2016-05-13 MED ORDER — SODIUM CHLORIDE 0.9 % IV SOLN
INTRAVENOUS | Status: AC
  Administered 2016-05-13: 20:00:00 via INTRAVENOUS

## 2016-05-13 MED ORDER — INSULIN ASPART 100 UNIT/ML ~~LOC~~ SOLN
0.0000 [IU] | Freq: Every day | SUBCUTANEOUS | Status: DC
  Administered 2016-05-14: 0 [IU] via SUBCUTANEOUS
  Administered 2016-05-15: 3 [IU] via SUBCUTANEOUS
  Administered 2016-05-18: 2 [IU] via SUBCUTANEOUS
  Administered 2016-05-20: 3 [IU] via SUBCUTANEOUS
  Administered 2016-05-22: 2 [IU] via SUBCUTANEOUS

## 2016-05-13 MED ORDER — MAGNESIUM CITRATE PO SOLN
1.0000 | Freq: Once | ORAL | Status: AC
  Administered 2016-05-13: 1 via ORAL
  Filled 2016-05-13: qty 296

## 2016-05-13 MED ORDER — DIAZEPAM 5 MG PO TABS: 5.0000 mg | ORAL_TABLET | Freq: Every evening | ORAL | Status: DC | PRN

## 2016-05-13 MED ORDER — ACETAMINOPHEN 500 MG PO TABS
1000.0000 mg | ORAL_TABLET | Freq: Every day | ORAL | Status: DC
  Administered 2016-05-14: 1000 mg via ORAL
  Filled 2016-05-13: qty 2

## 2016-05-13 MED ORDER — INSULIN DETEMIR 100 UNIT/ML ~~LOC~~ SOLN
5.0000 [IU] | Freq: Every day | SUBCUTANEOUS | Status: DC
  Administered 2016-05-13 – 2016-05-22 (×10): 5 [IU] via SUBCUTANEOUS
  Filled 2016-05-13 (×11): qty 0.05

## 2016-05-13 MED ORDER — DEXTROSE 5 % IV SOLN
500.0000 mg | Freq: Once | INTRAVENOUS | Status: DC
  Administered 2016-05-13: 500 mg via INTRAVENOUS
  Filled 2016-05-13: qty 500

## 2016-05-13 MED ORDER — INSULIN ASPART 100 UNIT/ML ~~LOC~~ SOLN
3.0000 [IU] | Freq: Three times a day (TID) | SUBCUTANEOUS | Status: DC
  Administered 2016-05-14 – 2016-05-15 (×3): 3 [IU] via SUBCUTANEOUS

## 2016-05-13 MED ORDER — IOPAMIDOL (ISOVUE-300) INJECTION 61%
30.0000 mL | Freq: Once | INTRAVENOUS | Status: DC | PRN
  Administered 2016-05-13: 30 mL via ORAL
  Filled 2016-05-13: qty 30

## 2016-05-13 MED ORDER — AMLODIPINE BESYLATE 5 MG PO TABS
5.0000 mg | ORAL_TABLET | Freq: Every day | ORAL | Status: DC
  Administered 2016-05-15 – 2016-05-22 (×7): 5 mg via ORAL
  Filled 2016-05-13 (×10): qty 1

## 2016-05-13 MED ORDER — CARVEDILOL 12.5 MG PO TABS
12.5000 mg | ORAL_TABLET | Freq: Two times a day (BID) | ORAL | Status: DC
  Administered 2016-05-14 – 2016-05-15 (×3): 12.5 mg via ORAL
  Administered 2016-05-15: 15.5 mg via ORAL
  Administered 2016-05-16 – 2016-05-23 (×16): 12.5 mg via ORAL
  Filled 2016-05-13 (×20): qty 1

## 2016-05-13 MED ORDER — AZITHROMYCIN 250 MG PO TABS
500.0000 mg | ORAL_TABLET | ORAL | Status: AC
  Administered 2016-05-14 – 2016-05-20 (×7): 500 mg via ORAL
  Filled 2016-05-13 (×7): qty 2

## 2016-05-13 NOTE — Telephone Encounter (Signed)
Left detailed message for pt to CB to schedule appointment at our office.

## 2016-05-13 NOTE — ED Notes (Signed)
This RN attempted IV access x2 without success. Another RN is looking for access

## 2016-05-13 NOTE — ED Notes (Signed)
Pt attempted to urinate x 2 unsuccessfully, bladder scan repeated, >969m. PA Tatyana notified

## 2016-05-13 NOTE — Telephone Encounter (Signed)
If he is that much worse, then he needs f/u either here or with cards.  30 min OV.  Please route this back to me so I can work on the orders.  Thanks.

## 2016-05-13 NOTE — H&P (Signed)
History and Physical    Amer Alcindor JHE:[id number] DOB: 1934-09-08 DOA: 05/13/2016  PCP: Elsie Stain, MD  Patient coming from: Home  Chief Complaint: Urinary rention  HPI: Chanse Kagel is a 82 y.o. male with medical history significant of past medical history of coronary artery disease (ischemic cardiomyopathy) status post CABG with a last EF in 2017 that showed an EF of 35%, and a kidney disease stage III, insulin-dependent diabetes mellitus anemia of chronic diseases never had a colonoscopy that comes into the emergency room complaining of urinary retention and fecal impactactation. Most of the history is provided with his wife as the patient doesn't speak much although he is able to verbalize and that nobody goes up his butt. Per the wife he's lost about 30 pounds to 40 pounds in the last 3 months his bowel movements have suddenly change over the last 3 months last week she disimpacted him, but today she couldn't as she relates it was too high. She relates that he's been having black watery diarrhea for the last 2 months, during this time his primary care doctor told him he needed a colonoscopy but he refused for the reason stated above. She relates that he's also been having a persistent cough that has been productive and progressively getting worse. But he denies shortness of breath.  ED Course:  In the ED: He was hypoxic on admission with saturations 88% on room air, a CT scan of the abdomen and pelvis was done that shows a right pleural effusion right lower lobe pneumonia and a collapsed right lung heavy stool burden throughout, he was found to have an acute renal failure (with a baseline creatinine of 1.5) on admission 2.1, mild leukocytosis with microcytic anemia  Review of Systems: As per HPI otherwise 10 point review of systems negative.   Past Medical History:  Diagnosis Date  . Anxiety   . Arthritis    "hands" (01/06/2012)  . Boil    "fluid boil left arm" (01/30/2012)  . CAD  (coronary artery disease)    bypass graft surgery 05/2006; grafts were widely patent on cath 06/2010   . Chronic kidney disease   . COPD (chronic obstructive pulmonary disease) ([place]) 1993  . 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 Diabetes mellitus, type 2 ([place]) 2000  . Elbow mass    left; "just noticed it ~ 2 wk ago"  (01/06/2012)  . Foot fracture 1953; 1962   left; left  . Hyperlipidemia 1993  . Hypertension 1993  . Ischemic cardiomyopathy    EF 25-30%; s/p single chamber ICD 2010; upgrade to Med[employer]onic BiV ICD 01/30/12  . LBBB (left bundle branch block)   . Myocardial infarction 2008; 01/06/2012  . Nightmares    with psych eval 2012 at [facility]  . PTSD (post-traumatic stress disorder)   . Shortness of breath 01/05/2012   "all the time; been going on for awhile"  . Syncope and collapse 01/05/2012  . Systolic heart failure    class II  . Tinnitus of left ear     Past Surgical History:  Procedure Laterality Date  . APPENDECTOMY  1957  . BI-VENTRICULAR IMPLANTABLE CARDIOVERTER DEFIBRILLATOR UPGRADE N/A 01/30/2012   Procedure: BI-VENTRICULAR IMPLANTABLE CARDIOVERTER DEFIBRILLATOR UPGRADE;  Surgeon: Evans Lance, MD;  Location: [facility] CATH LAB;  Service: Cardiovascular;  Laterality: N/A;  . CARDIAC DEFIBRILLATOR PLACEMENT  01/30/2012   biventricular ICD  . CARDIAC DEFIBRILLATOR PLACEMENT  01/30/2012   biventricular ICD  . CORONARY ARTERY BYPASS GRAFT  2008  3 vessel, class 4 CHF, non Q wave MI 05/19/06  . [place]; ~ 1962   left; left  . INSERT / REPLACE / REMOVE PACEMAKER  01/30/2012   biventricular ICD placed     reports that he has quit smoking. His smoking use included Cigarettes. He has a 17.50 pack-year smoking history. He has never used smokeless tobacco. He reports that he drinks about 4.2 oz of alcohol per week . He reports that he does not use drugs.  Allergies  Allergen Reactions  . Sulfa Antibiotics Swelling and Rash    Hands swell    Family History  Problem Relation  Age of Onset  . Heart disease Father     MI age 66  . Prostate cancer Father   . Diabetes Sister   . Hyperlipidemia Sister   . Cancer Maternal Grandmother     liver, cirrhosis  . Depression Neg Hx   . Alcohol abuse Neg Hx   . Drug abuse Neg Hx   . Stroke Neg Hx   . Colon cancer Neg Hx     Prior to Admission medications   Medication Sig Start Date End Date Taking? Authorizing Provider  acetaminophen (TYLENOL) 500 MG tablet Take 1,000 mg by mouth daily.   Yes Historical Provider, MD  amLODipine (NORVASC) 5 MG tablet Take 5 mg by mouth at bedtime.    Yes Historical Provider, MD  aspirin 81 MG tablet Take 81 mg by mouth at bedtime.    Yes Historical Provider, MD  carvedilol (COREG) 25 MG tablet TAKE 1 & 1/2 TABLETS BY MOUTH TWICE A DAY 03/25/16  Yes Tonia Ghent, MD  diazepam (VALIUM) 10 MG tablet TAKE 1/2 TO 1 TABLET BY MOUTH AT [facility] NEEDED FOR ANXIETY OR RINGING IN EARS 04/03/16  Yes Tonia Ghent, MD  ferrous sulfate 325 (65 FE) MG tablet TAKE 1 TABLET BY MOUTH ONCE A DAY WITH BREAKFAST Patient taking differently: TAKE 1 TABLET BY MOUTH ONCE at night 05/05/16  Yes Tonia Ghent, MD  Insulin Disposable Pump (V-GO 20) KIT USE DAILY AS DIRECTED 05/05/16  Yes Elayne Snare, MD  Insulin Pen Needle (PEN NEEDLES) 31G X 5 MM MISC Inject 15 Units as directed 2 (two) times daily. Use pen needles for pt insulin pen. Dx E11.29 04/11/15  Yes Tonia Ghent, MD  Multiple Vitamins-Minerals (BONE SMART PO) Take 1 tablet by mouth daily.   Yes Historical Provider, MD  nitroGLYCERIN (NITROSTAT) 0.4 MG SL tablet Place 1 tablet (0.4 mg total) under the tongue every 5 (five) minutes as needed. For chest pain 09/14/12  Yes Wellington Hampshire, MD  NOVOLOG 100 UNIT/ML injection USE MAXIMUM OF 56 UNITS PER DAY WITH V-GO PUMP 04/14/16  Yes Elayne Snare, MD  Nutritional Supplements (QUINOA KALE & HEMP PO) Take by mouth daily.   Yes Historical Provider, MD  [facility] DELICA LANCETS FINE MISC Use to check blood sugar 3  times per day dx code 250.42 08/30/13  Yes Elayne Snare, MD  [facility] VERIO test strip USE AS DIRECTED TO CHECK BLOOD SUGAR THREE TIMES PER DAY 05/05/16  Yes Tonia Ghent, MD  pregabalin (LYRICA) 25 MG capsule Take 1 capsule (25 mg total) by mouth 2 (two) times daily. 03/25/16  Yes Larey Dresser, MD  rosuvastatin (CRESTOR) 40 MG tablet TAKE 1 TABLET BY MOUTH ONCE A DAY 05/05/16  Yes Larey Dresser, MD  spironolactone (ALDACTONE) 25 MG tablet Take 0.5 tablets (12.5 mg total) by mouth  daily. Patient taking differently: Take 12.5 mg by mouth at bedtime.  06/11/15  Yes Larey Dresser, MD  torsemide (DEMADEX) 20 MG tablet Take 1 tablet (20 mg total) by mouth every other day. 04/14/16  Yes Satira Mccallum Tillery, PA-C  VICTOZA 18 MG/3ML SOPN Inject 0.2 mLs (1.2 mg total) into the skin daily. Inject once daily at the same time Patient taking differently: Inject 1.2 mg into the skin daily. Only takes when blood sugar is high 10/24/15  Yes Elayne Snare, MD  cephALEXin (KEFLEX) 500 MG capsule Take 1 capsule (500 mg total) by mouth 4 (four) times daily. Patient not taking: Reported on 05/13/2016 04/08/16   Tonia Ghent, MD    Physical Exam: Vitals:   05/13/16 1515 05/13/16 1530 05/13/16 1600 05/13/16 1605  BP:    117/68  Pulse: 93 92 91 94  Resp:    18  Temp:      SpO2: 97% 96% 93% 96%      Constitutional: NAD, calm, comfortable Vitals:   05/13/16 1515 05/13/16 1530 05/13/16 1600 05/13/16 1605  BP:    117/68  Pulse: 93 92 91 94  Resp:    18  Temp:      SpO2: 97% 96% 93% 96%   Eyes: PERRL, lids and conjunctivae normal ENMT: Mucous membranes are Dry,.Normal dentition.  Neck: normal, supple, no masses, no thyromegaly Respiratory: He has good air movement with crackles on the right lower lobe and right probably middle lobe the left side is clean Cardiovascular: Regular rate and rhythm, no murmurs / rubs / gallops. No extremity edema. 2+ pedal pulses. No carotid bruits.  Abdomen: Positive bowel  sounds abdomen is nondistended nontender no palpable masses Musculoskeletal: no clubbing / cyanosis. No joint deformity upper and lower extremities. Good ROM, no contractures. Normal muscle tone.  Skin: no rashes, lesions, ulcers. No induration Neurologic: CN 2-12 grossly intact. Sensation intact, DTR normal. Strength 5/5 in all 4.  Psychiatric: Normal judgment and insight. Alert and oriented x 3. Normal mood.   Labs on Admission: I have personally reviewed following labs and imaging studies  CBC:  Recent Labs Lab 05/13/16 1159  WBC 12.9*  NEUTROABS 11.1*  HGB 9.5*  HCT 30.0*  MCV 88.2  PLT 782   Basic Metabolic Panel:  Recent Labs Lab 05/07/16 1504 05/13/16 1159  NA 137 138  K 4.0 4.3  CL 98 101  CO2 31 28  GLUCOSE 166* 176*  BUN 21 34*  CREATININE 1.56* 2.17*  CALCIUM 10.2 9.8   GFR: CrCl cannot be calculated (Unknown ideal weight.). Liver Function Tests:  Recent Labs Lab 05/07/16 1504  AST 8  ALT 10  ALKPHOS 45  BILITOT 0.4  PROT 7.1  ALBUMIN 3.6   No results for input(s): LIPASE, AMYLASE in the last 168 hours. No results for input(s): AMMONIA in the last 168 hours. Coagulation Profile: No results for input(s): INR, PROTIME in the last 168 hours. Cardiac Enzymes: No results for input(s): CKTOTAL, CKMB, CKMBINDEX, TROPONINI in the last 168 hours. BNP (last 3 results)  Recent Labs  04/08/16 1529  PROBNP 511.0*   HbA1C: No results for input(s): HGBA1C in the last 72 hours. CBG: No results for input(s): GLUCAP in the last 168 hours. Lipid Profile: No results for input(s): CHOL, HDL, LDLCALC, TRIG, CHOLHDL, LDLDIRECT in the last 72 hours. Thyroid Function Tests: No results for input(s): TSH, T4TOTAL, FREET4, T3FREE, THYROIDAB in the last 72 hours. Anemia Panel: No results for input(s): VITAMINB12, FOLATE, FERRITIN,  TIBC, IRON, RETICCTPCT in the last 72 hours. Urine analysis:    Component Value Date/Time   COLORURINE YELLOW 05/13/2016 1316    APPEARANCEUR HAZY (A) 05/13/2016 1316   LABSPEC 1.013 05/13/2016 1316   PHURINE 6.0 05/13/2016 1316   GLUCOSEU NEGATIVE 05/13/2016 1316   GLUCOSEU 250 (A) 06/16/2014 0910   HGBUR NEGATIVE 05/13/2016 1316   BILIRUBINUR NEGATIVE 05/13/2016 1316   KETONESUR NEGATIVE 05/13/2016 1316   PROTEINUR 100 (A) 05/13/2016 1316   UROBILINOGEN 0.2 06/16/2014 0910   NITRITE NEGATIVE 05/13/2016 1316   LEUKOCYTESUR NEGATIVE 05/13/2016 1316   Sepsis Labs: !!!!!!!!!!!!!!!!!!!!!!!!!!!!!!!!!!!!!!!!!!!! '@LABRCNTIP'$ (procalcitonin:4,lacticidven:4) )No results found for this or any previous visit (from the past 240 hour(s)).   Radiological Exams on Admission: Ct Abdomen Pelvis Wo Contrast  Result Date: 05/13/2016 CLINICAL DATA:  Abdominal pain and constipation EXAM: CT ABDOMEN AND PELVIS WITHOUT CONTRAST TECHNIQUE: Multidetector CT imaging of the abdomen and pelvis was performed following the standard protocol without IV contrast. COMPARISON:  None. FINDINGS: Lower chest: Left lung base is clear. The right lung base demonstrates a large pleural effusion with complete consolidation of the right lower lobe. Patchy density is noted within the collapsed lung. The possibility of underlying neoplasm could not be totally excluded on the basis of this exam. Hepatobiliary: Dependent small gallstones are noted. The gallbladder and liver are otherwise within normal limits. Pancreas: Unremarkable. No pancreatic ductal dilatation or surrounding inflammatory changes. Spleen: Normal in size without focal abnormality. Adrenals/Urinary Tract: Adrenal glands are within normal limits. Scattered renal vascular calcifications are seen. A few nonobstructing stones are noted measuring less than 5 mm. The ureters are well visualized bilaterally. The bladder has been decompressed by Foley catheter. Stomach/Bowel: The appendix has been surgically removed. Mild fecal material is noted within the colon although no findings to suggest, significant  constipation or obstruction are noted. No inflammatory changes are seen. Vascular/Lymphatic: Aortic atherosclerosis. No enlarged abdominal or pelvic lymph nodes. Reproductive: Prostate is mildly enlarged with diffuse calcifications. Other: No abdominal wall hernia or abnormality. No abdominopelvic ascites. Musculoskeletal: No acute or significant osseous findings. IMPRESSION: Large right pleural effusion with right lower lobe and middle lobe consolidation. Varying densities are seen within the collapsed right lung. Possibility of neoplasm could not be totally excluded. Small gallstones without complicating factors. Small bilateral nonobstructing renal stones measuring less than 5 mm. Electronically Signed   By: Inez Catalina M.D.   On: 05/13/2016 16:15   Dg Abd 2 Views  Result Date: 05/13/2016 CLINICAL DATA:  Constipation.  No bowel movement for 8 days. EXAM: ABDOMEN - 2 VIEW COMPARISON:  None. FINDINGS: No evidence of bowel obstruction. No visible significant increase in stool burden. No organomegaly or suspicious calcification. No free air. IMPRESSION: No acute findings. Electronically Signed   By: Rolm Baptise M.D.   On: 05/13/2016 12:02    EKG: Independently reviewed. None  Assessment/Plan Acute respiratory failure with hypoxia (HCC) probably due to PNA and probably right pleural effusion: This probably due to pneumonia, right pleural effusion and probably some restriction from his stool burden. I agree with IV empiric antibiotics Rocephin and azithromycin, blood cultures and sputum cultures have been ordered. His blood pressure has been stable and not tachycardic he is not septicat this point. I will consult IR to perform thoracocentesis and for cytology (to rule out malignancy), cell count, pH LDH and albumin. And calculate his light's criteria.  Fecal impactactation: Go ahead and give him SMOG x2l 2, his CT scan of the abdomen and pelvis showed that  his stomach is full of fluid, he denies any  nausea or vomiting. Will call ahead and give him SMOG enema and mag citriate orally.  AKI on Stage 3 chronic renal impairment associated with type 2 diabetes mellitus ([place]): Like a prerenal in etiology, urinary sodium urinary creatinine we'll plan to start him on aggressive IV fluids and check a basic metabolic panel in the morning.   Hold Diuretics torsemide and Aldactone  Microcytic anemia likely, presumably Anemia iron deficiency/ Melena: Check an anemia panel, he had melanotic stools on rectal exam. Use SCDs for DVT prophylaxis, hold aspirin. Stop ferrous sulfate. He's never had a colonoscopy as he relates nobody is going up his butt. CT scan of the abdomen and pelvis did not show any obvious masses masses. He has never seen a gastroenterologist called [facility] Dr. Kalman Shan.  Stage 3 chronic renal impairment associated with type 2 diabetes mellitus ([place]):: Was started on a low dose of Lantuss daily plus sliding scale insulin check a hemoglobin A1c.  Chronic systolic CHF (congestive heart failure) ([place]): He seems euvolemic on physical exam will hold diuretics due to his acute renal failure    Essential hypertension: Hold diuretics torsemide and Aldactone, continue Norvasc Coreg  Urinary retention: This probably due to fecal impactation. Foley in places with good urine output.  DVT prophylaxis: SCD's Code Status: full Family Communication: wife Disposition Plan: home in 2.10.2018 Consults called: none Admission status: inpatient   Charlynne Cousins MD [facility] Pager [phone]  If 7PM-7AM, please contact night-coverage [email or web address] Password [email or web address]  05/13/2016, 5:05 PM

## 2016-05-13 NOTE — ED Provider Notes (Signed)
[facility] DEPT Provider Note   CSN: [id number] Arrival date & time: 05/13/16  1036     History   Chief Complaint Chief Complaint  Patient presents with  . Urinary Retention  . Fecal Impaction  . Abdominal Pain    HPI Henrry Feil is a 82 y.o. male.  HPI  Cathan Gearin is a 82 y.o. male with history of anxiety, coronary artery disease, COPD, chronic kidney disease, diabetes, hypertension, ischemic cardiomyopathy, presents to emergency department complaining of fecal impaction and urinary retention. Patient's wife provides most of the history. She states that he has had no bowel movement in one week. He reports pressure in his rectum. She states that he stopped urinating yesterday morning. She reports similar episode 2 weeks ago when she states she had to "dig out his stool out of his rectum." She states at that time that helped. She reports the patient has had issues with dark tarry stools for several months. He has seen his doctor who advised him that he needs a colonoscopy, however patient refuses to have that done because he is afraid of what they will find. Patient's wife states that he did lose some weight over the last several months and now has these new episodes of constipation. She states that she tried to give him stool softener which did not help, she tried an enema but was unable to advance an animal past the stool. She did not try manual disimpaction this time. She is also concerned that he is not able to urinate.  Past Medical History:  Diagnosis Date  . Anxiety   . Arthritis    "hands" (01/06/2012)  . Boil    "fluid boil left arm" (01/30/2012)  . CAD (coronary artery disease)    bypass graft surgery 05/2006; grafts were widely patent on cath 06/2010   . Chronic kidney disease   . COPD (chronic obstructive pulmonary disease) ([place]) 1993  . 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 Diabetes mellitus, type 2 ([place]) 2000  . Elbow mass    left; "just noticed it ~ 2 wk ago"  (01/06/2012)    . Foot fracture 1953; 1962   left; left  . Hyperlipidemia 1993  . Hypertension 1993  . Ischemic cardiomyopathy    EF 25-30%; s/p single chamber ICD 2010; upgrade to [employer] BiV ICD 01/30/12  . LBBB (left bundle branch block)   . Myocardial infarction 2008; 01/06/2012  . Nightmares    with psych eval 2012 at [facility]  . PTSD (post-traumatic stress disorder)   . Shortness of breath 01/05/2012   "all the time; been going on for awhile"  . Syncope and collapse 01/05/2012  . Systolic heart failure    class II  . Tinnitus of left ear     Patient Active Problem List   Diagnosis Date Noted  . Rash and nonspecific skin eruption 04/09/2016  . Elevated serum creatinine 03/20/2016  . Anemia, iron deficiency 04/05/2015  . Memory change 03/12/2015  . Pustules determined by examination 09/10/2014  . Frequent falls 08/04/2014  . Diarrhea 08/04/2014  . Chronic kidney disease, stage III (moderate) 11/25/2013  . Coronary atherosclerosis of native coronary artery 10/13/2013  . Routine general medical examination at a health care facility 06/12/2013  . PAD (peripheral artery disease) ([place]) 09/14/2012  . OSA (obstructive sleep apnea) 05/06/2012  . Other primary cardiomyopathies 05/05/2012  . Vertigo 03/10/2012  . Obese 03/10/2012  . NSTEMI (non-ST elevated myocardial infarction) ([place]) 01/07/2012  . Chronic systolic CHF (congestive heart  failure) ([place]) 01/06/2012  . Knee pain 06/25/2011  . Sensation disturbance of skin 01/10/2011  . Nightmares 01/10/2011  . Anxiety 11/26/2010  . SOB (shortness of breath) 10/22/2010  . Automatic implantable cardioverter-defibrillator in situ 03/18/2010  . DIZZINESS 08/13/2009  . Type II diabetes mellitus with renal manifestations, uncontrolled ([place]) 07/25/2009  . GERD 06/22/2008  . CAD (coronary artery disease) 06/04/2008  . TINNITUS 11/09/2006  . ADVEF, DRUG/MEDICINAL/BIOLOGICAL SUBST NOS 09/14/2006  . Hyperlipidemia 08/12/2006  . Essential hypertension  08/12/2006  . COPD 08/12/2006  . NICOTINE ADDICTION 08/10/2006  . AMI, NON-Q WAVE, INITIAL EPISODE 08/10/2006  . Ischemic cardiomyopathy 08/10/2006  . ERECTILE DYSFUNCTION, ORGANIC 08/10/2006  . Insomnia 08/10/2006    Past Surgical History:  Procedure Laterality Date  . APPENDECTOMY  1957  . BI-VENTRICULAR IMPLANTABLE CARDIOVERTER DEFIBRILLATOR UPGRADE N/A 01/30/2012   Procedure: BI-VENTRICULAR IMPLANTABLE CARDIOVERTER DEFIBRILLATOR UPGRADE;  Surgeon: Evans Lance, MD;  Location: [facility] CATH LAB;  Service: Cardiovascular;  Laterality: N/A;  . CARDIAC DEFIBRILLATOR PLACEMENT  01/30/2012   biventricular ICD  . CARDIAC DEFIBRILLATOR PLACEMENT  01/30/2012   biventricular ICD  . CORONARY ARTERY BYPASS GRAFT  2008   3 vessel, class 4 CHF, non Q wave MI 05/19/06  . [place]; ~ 1962   left; left  . INSERT / REPLACE / REMOVE PACEMAKER  01/30/2012   biventricular ICD placed       Home Medications    Prior to Admission medications   Medication Sig Start Date End Date Taking? Authorizing Provider  amLODipine (NORVASC) 5 MG tablet Take 5 mg by mouth daily.    Historical Provider, MD  aspirin 81 MG tablet Take 81 mg by mouth every other day.     Historical Provider, MD  Besifloxacin HCl (BESIVANCE) 0.6 % SUSP Apply 1 drop to eye 3 (three) times daily.    Historical Provider, MD  Bromfenac Sodium (PROLENSA) 0.07 % SOLN Apply 1 drop to eye at bedtime.    Historical Provider, MD  carvedilol (COREG) 25 MG tablet TAKE 1 & 1/2 TABLETS BY MOUTH TWICE A DAY 03/25/16   Tonia Ghent, MD  cephALEXin (KEFLEX) 500 MG capsule Take 1 capsule (500 mg total) by mouth 4 (four) times daily. 04/08/16   Tonia Ghent, MD  diazepam (VALIUM) 10 MG tablet TAKE 1/2 TO 1 TABLET BY MOUTH AT [facility] NEEDED FOR ANXIETY OR RINGING IN EARS 04/03/16   Tonia Ghent, MD  Difluprednate (DUREZOL) 0.05 % EMUL Apply 1 drop to eye 3 (three) times daily.    Historical Provider, MD  ferrous sulfate 325 (65 FE)  MG tablet TAKE 1 TABLET BY MOUTH ONCE A DAY WITH BREAKFAST 05/05/16   Tonia Ghent, MD  Insulin Disposable Pump (V-GO 20) KIT USE DAILY AS DIRECTED 05/05/16   Elayne Snare, MD  Insulin Pen Needle (PEN NEEDLES) 31G X 5 MM MISC Inject 15 Units as directed 2 (two) times daily. Use pen needles for pt insulin pen. Dx E11.29 04/11/15   Tonia Ghent, MD  nitroGLYCERIN (NITROSTAT) 0.4 MG SL tablet Place 1 tablet (0.4 mg total) under the tongue every 5 (five) minutes as needed. For chest pain 09/14/12   Wellington Hampshire, MD  NOVOLOG 100 UNIT/ML injection USE MAXIMUM OF 56 UNITS PER DAY WITH V-GO PUMP 04/14/16   Elayne Snare, MD  NOVOLOG FLEXPEN 100 UNIT/ML FlexPen 56 Units. Use max 56 units per day with V-go 04/20/15   Historical Provider, MD  [place]  Use to check blood sugar 3 times per day dx code 250.42 08/30/13   Elayne Snare, MD  [facility] VERIO test strip USE AS DIRECTED TO CHECK BLOOD SUGAR THREE TIMES PER DAY 05/05/16   Tonia Ghent, MD  pregabalin (LYRICA) 25 MG capsule Take 1 capsule (25 mg total) by mouth 2 (two) times daily. 03/25/16   Larey Dresser, MD  rosuvastatin (CRESTOR) 40 MG tablet TAKE 1 TABLET BY MOUTH ONCE A DAY 05/05/16   Larey Dresser, MD  spironolactone (ALDACTONE) 25 MG tablet Take 0.5 tablets (12.5 mg total) by mouth daily. 06/11/15   Larey Dresser, MD  torsemide (DEMADEX) 20 MG tablet Take 1 tablet (20 mg total) by mouth every other day. 04/14/16   Shirley Friar, PA-C  VICTOZA 18 MG/3ML SOPN Inject 0.2 mLs (1.2 mg total) into the skin daily. Inject once daily at the same time 10/24/15   Elayne Snare, MD    Family History Family History  Problem Relation Age of Onset  . Heart disease Father     MI age 40  . Prostate cancer Father   . Diabetes Sister   . Hyperlipidemia Sister   . Cancer Maternal Grandmother     liver, cirrhosis  . Depression Neg Hx   . Alcohol abuse Neg Hx   . Drug abuse Neg Hx   . Stroke Neg Hx   . Colon cancer Neg Hx      Social History Social History  Substance Use Topics  . Smoking status: Former Smoker    Packs/day: 0.50    Years: 35.00    Types: Cigarettes  . Smokeless tobacco: Never Used     Comment: 01/06/2012 "quit smoking in 2008; slip and have one ocasionally still"  . Alcohol use 4.2 oz/week    7 Glasses of wine per week     Comment: 01/06/2012 "big bottle of wine at least 3 nights/wk; 6 pack pretty much q night"; 01/30/2012 "maybe a glass of wine q hs since left hospital last"". 06/2013 "a beer or two a week"     Allergies   Sulfa antibiotics   Review of Systems Review of Systems  Constitutional: Negative for chills and fever.  Respiratory: Negative for cough, chest tightness and shortness of breath.   Cardiovascular: Negative for chest pain, palpitations and leg swelling.  Gastrointestinal: Positive for abdominal pain and constipation. Negative for abdominal distention, diarrhea, nausea and vomiting.  Genitourinary: Positive for difficulty urinating. Negative for dysuria, hematuria and urgency.  Musculoskeletal: Negative for arthralgias, myalgias, neck pain and neck stiffness.  Skin: Negative for rash.  Allergic/Immunologic: Negative for immunocompromised state.  Neurological: Negative for dizziness, weakness, light-headedness, numbness and headaches.  All other systems reviewed and are negative.    Physical Exam Updated Vital Signs BP 156/70   Pulse 90   Temp 98.2 F (36.8 C)   SpO2 90%   Physical Exam  Constitutional: He appears well-developed and well-nourished. No distress.  HENT:  Head: Normocephalic and atraumatic.  Eyes: Conjunctivae are normal.  Neck: Neck supple.  Cardiovascular: Normal rate, regular rhythm and normal heart sounds.   Pulmonary/Chest: Effort normal. No respiratory distress. He has no wheezes. He has no rales.  Abdominal: Soft. Bowel sounds are normal. There is no tenderness. There is no rebound.  Distended bladder palpable just below the  umbilicus. No significant tenderness.  Genitourinary:  Genitourinary Comments: External and internal hemorrhoids present. Stool is dark brown. Fecal impaction is present.  Musculoskeletal: He exhibits no edema.  Neurological: He is alert.  Skin: Skin is warm and dry.  Nursing note and vitals reviewed.    ED Treatments / Results  Labs (all labs ordered are listed, but only abnormal results are displayed) Labs Reviewed  CBC WITH DIFFERENTIAL/PLATELET - Abnormal; Notable for the following:       Result Value   WBC 12.9 (*)    RBC 3.40 (*)    Hemoglobin 9.5 (*)    HCT 30.0 (*)    Neutro Abs 11.1 (*)    All other components within normal limits  URINALYSIS, ROUTINE W REFLEX MICROSCOPIC  BASIC METABOLIC PANEL    EKG  EKG Interpretation None       Radiology Ct Abdomen Pelvis Wo Contrast  Result Date: 05/13/2016 CLINICAL DATA:  Abdominal pain and constipation EXAM: CT ABDOMEN AND PELVIS WITHOUT CONTRAST TECHNIQUE: Multidetector CT imaging of the abdomen and pelvis was performed following the standard protocol without IV contrast. COMPARISON:  None. FINDINGS: Lower chest: Left lung base is clear. The right lung base demonstrates a large pleural effusion with complete consolidation of the right lower lobe. Patchy density is noted within the collapsed lung. The possibility of underlying neoplasm could not be totally excluded on the basis of this exam. Hepatobiliary: Dependent small gallstones are noted. The gallbladder and liver are otherwise within normal limits. Pancreas: Unremarkable. No pancreatic ductal dilatation or surrounding inflammatory changes. Spleen: Normal in size without focal abnormality. Adrenals/Urinary Tract: Adrenal glands are within normal limits. Scattered renal vascular calcifications are seen. A few nonobstructing stones are noted measuring less than 5 mm. The ureters are well visualized bilaterally. The bladder has been decompressed by Foley catheter. Stomach/Bowel:  The appendix has been surgically removed. Mild fecal material is noted within the colon although no findings to suggest, significant constipation or obstruction are noted. No inflammatory changes are seen. Vascular/Lymphatic: Aortic atherosclerosis. No enlarged abdominal or pelvic lymph nodes. Reproductive: Prostate is mildly enlarged with diffuse calcifications. Other: No abdominal wall hernia or abnormality. No abdominopelvic ascites. Musculoskeletal: No acute or significant osseous findings. IMPRESSION: Large right pleural effusion with right lower lobe and middle lobe consolidation. Varying densities are seen within the collapsed right lung. Possibility of neoplasm could not be totally excluded. Small gallstones without complicating factors. Small bilateral nonobstructing renal stones measuring less than 5 mm. Electronically Signed   By: Inez Catalina M.D.   On: 05/13/2016 16:15   Dg Abd 2 Views  Result Date: 05/13/2016 CLINICAL DATA:  Constipation.  No bowel movement for 8 days. EXAM: ABDOMEN - 2 VIEW COMPARISON:  None. FINDINGS: No evidence of bowel obstruction. No visible significant increase in stool burden. No organomegaly or suspicious calcification. No free air. IMPRESSION: No acute findings. Electronically Signed   By: Rolm Baptise M.D.   On: 05/13/2016 12:02    Procedures Procedures (including critical care time)  Medications Ordered in ED Medications - No data to display   Initial Impression / Assessment and Plan / ED Course  I have reviewed the triage vital signs and the nursing notes.  Pertinent labs & imaging results that were available during my care of the patient were reviewed by me and considered in my medical decision making (see chart for details).    Patient in emergency department with fecal impaction and urinary retention. No urine since yesterday. Bladder scanner showed greater than 900 mL of urine. Will try to disimpact and see if patient is able urinate after. Blood  work ordered.  12:24 PM Patient was manually  disimpacted with a very large amount of stool out of his rectum. He feels better. He actually had urinated over the blanket during disimpaction, I'm unable to tell how much of urine was put out. We will see if he is able to urinate now. If unable to urinate we'll do in and out.  4:46 PM CT showing large pleural effusion, consolidation, and possible neoplasm. Added CXR, ECG, trop. PA Sanders to follow up on those. Spoke with Dr. Aileen Fass, will admit.    Final Clinical Impressions(s) / ED Diagnoses   Final diagnoses:  Abdominal pain    New Prescriptions New Prescriptions   No medications on file     Jeannett Senior, PA-C 05/15/16 [place], MD 05/17/16 [id number]

## 2016-05-13 NOTE — ED Triage Notes (Signed)
Per EMS pt coming from home with c/o abdominal pain and constipation (last BM 8 days ago), as well as urinary retention (last UOP yesterday around noon). Per EMS pt has a hx of fecal impaction as well as significant medical hx, including uncontrolled diabetes.

## 2016-05-13 NOTE — ED Notes (Signed)
Pt sts feels much better since foley catheter was inserted, denies any needs at this time

## 2016-05-14 ENCOUNTER — Inpatient Hospital Stay (HOSPITAL_COMMUNITY): Payer: PPO

## 2016-05-14 LAB — GLUCOSE, CAPILLARY
GLUCOSE-CAPILLARY: 111 mg/dL — AB (ref 65–99)
GLUCOSE-CAPILLARY: 116 mg/dL — AB (ref 65–99)
GLUCOSE-CAPILLARY: 158 mg/dL — AB (ref 65–99)
GLUCOSE-CAPILLARY: 93 mg/dL (ref 65–99)
Glucose-Capillary: 107 mg/dL — ABNORMAL HIGH (ref 65–99)
Glucose-Capillary: 122 mg/dL — ABNORMAL HIGH (ref 65–99)

## 2016-05-14 LAB — PROTEIN, BODY FLUID: Total protein, fluid: 4.4 g/dL

## 2016-05-14 LAB — COMPREHENSIVE METABOLIC PANEL
ALK PHOS: 42 U/L (ref 38–126)
ALT: 12 U/L — ABNORMAL LOW (ref 17–63)
ANION GAP: 4 — AB (ref 5–15)
AST: 13 U/L — ABNORMAL LOW (ref 15–41)
Albumin: 3.1 g/dL — ABNORMAL LOW (ref 3.5–5.0)
BILIRUBIN TOTAL: 0.4 mg/dL (ref 0.3–1.2)
BUN: 29 mg/dL — ABNORMAL HIGH (ref 6–20)
CALCIUM: 9.3 mg/dL (ref 8.9–10.3)
CO2: 32 mmol/L (ref 22–32)
Chloride: 104 mmol/L (ref 101–111)
Creatinine, Ser: 1.94 mg/dL — ABNORMAL HIGH (ref 0.61–1.24)
GFR calc Af Amer: 36 mL/min — ABNORMAL LOW (ref >60)
GFR, EST NON AFRICAN AMERICAN: 31 mL/min — AB (ref >60)
GLUCOSE: 120 mg/dL — AB (ref 65–99)
POTASSIUM: 4.2 mmol/L (ref 3.5–5.1)
Sodium: 140 mmol/L (ref 135–145)
TOTAL PROTEIN: 6.6 g/dL (ref 6.5–8.1)

## 2016-05-14 LAB — GRAM STAIN

## 2016-05-14 LAB — SODIUM, URINE, RANDOM: SODIUM UR: 59 mmol/L

## 2016-05-14 LAB — BODY FLUID CELL COUNT WITH DIFFERENTIAL
LYMPHS FL: 43 %
Monocyte-Macrophage-Serous Fluid: 54 % (ref 50–90)
NEUTROPHIL FLUID: 3 % (ref 0–25)
WBC FLUID: 430 uL (ref 0–1000)

## 2016-05-14 LAB — CREATININE, URINE, RANDOM: Creatinine, Urine: 81.89 mg/dL

## 2016-05-14 LAB — LACTATE DEHYDROGENASE
LDH: 155 U/L (ref 98–192)
LDH: 164 U/L (ref 98–192)

## 2016-05-14 LAB — LACTATE DEHYDROGENASE, PLEURAL OR PERITONEAL FLUID: LD, Fluid: 241 U/L — ABNORMAL HIGH (ref 3–23)

## 2016-05-14 LAB — STREP PNEUMONIAE URINARY ANTIGEN: STREP PNEUMO URINARY ANTIGEN: NEGATIVE

## 2016-05-14 LAB — HIV ANTIBODY (ROUTINE TESTING W REFLEX): HIV SCREEN 4TH GENERATION: NONREACTIVE

## 2016-05-14 NOTE — Telephone Encounter (Signed)
Thanks

## 2016-05-14 NOTE — Telephone Encounter (Signed)
Patient's wife [name] [name] notified as instructed by telephone and verbalized understanding. [name] [name] stated that patient had a colonoscopy and they found polyps and his right lung was full. [name] [name] stated that she will contact Dr. Damita Dunnings after he leaves the hospital and what the needs are at that time. Wished her well and advised her to let us know what is needed in the future.

## 2016-05-14 NOTE — Telephone Encounter (Signed)
Please let her know that I held off on doing these orders right now since patient is currently admitted to the hospital. We can work on it after discharge if needed, if the inpatient team does not take care of this. Thanks.

## 2016-05-14 NOTE — Consult Note (Signed)
[facility] Consult Note  Referring Provider: No ref. provider found Primary Care Physician:  Elsie Stain, MD Primary Gastroenterologist:  Dr.  Laurel Dimmer Complaint: Unable to have a bowel movement HPI: [name] [name] is an 82 y.o. white male  with medical history significant of past medical history of coronary artery disease (ischemic cardiomyopathy) status post CABG with a last EF in 2017 that showed an EF of 35%, and a kidney disease stage III, insulin-dependent diabetes mellitus anemia of chronic disease, subsequently diagnosed Pneumonia and right pleural effusion, admitted and GI consultation requested due to anemia with reported melenic stools with guaiac pending. However he was found to have a fecal impaction. He has refused colonoscopy this admission. He has never had one before. He just got back from thoracentesis and is getting ready to have his SMOG enemas. His wife is not present and his enunciation is somewhat difficult for me to interpret.  Past Medical History:  Diagnosis Date  . Anxiety   . Arthritis    "hands" (01/06/2012)  . Boil    "fluid boil left arm" (01/30/2012)  . CAD (coronary artery disease)    bypass graft surgery 05/2006; grafts were widely patent on cath 06/2010   . Chronic kidney disease   . COPD (chronic obstructive pulmonary disease) ([place]) 1993  . 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 Diabetes mellitus, type 2 ([place]) 2000  . Elbow mass    left; "just noticed it ~ 2 wk ago"  (01/06/2012)  . Foot fracture 1953; 1962   left; left  . Hyperlipidemia 1993  . Hypertension 1993  . Ischemic cardiomyopathy    EF 25-30%; s/p single chamber ICD 2010; upgrade to Medtronic BiV ICD 01/30/12  . LBBB (left bundle branch block)   . Myocardial infarction 2008; 01/06/2012  . Nightmares    with psych eval 2012 at [facility]  . PTSD (post-traumatic stress disorder)   . Shortness of breath 01/05/2012   "all the time; been going on for awhile"  . Syncope and collapse 01/05/2012  .  Systolic heart failure    class II  . Tinnitus of left ear     Past Surgical History:  Procedure Laterality Date  . APPENDECTOMY  1957  . BI-VENTRICULAR IMPLANTABLE CARDIOVERTER DEFIBRILLATOR UPGRADE N/A 01/30/2012   Procedure: BI-VENTRICULAR IMPLANTABLE CARDIOVERTER DEFIBRILLATOR UPGRADE;  Surgeon: Evans Lance, MD;  Location: [facility] CATH LAB;  Service: Cardiovascular;  Laterality: N/A;  . CARDIAC DEFIBRILLATOR PLACEMENT  01/30/2012   biventricular ICD  . CARDIAC DEFIBRILLATOR PLACEMENT  01/30/2012   biventricular ICD  . CORONARY ARTERY BYPASS GRAFT  2008   3 vessel, class 4 CHF, non Q wave MI 05/19/06  . [place]; ~ 1962   left; left  . INSERT / REPLACE / REMOVE PACEMAKER  01/30/2012   biventricular ICD placed    Medications Prior to Admission  Medication Sig Dispense Refill  . acetaminophen (TYLENOL) 500 MG tablet Take 1,000 mg by mouth daily.    Marland Kitchen amLODipine (NORVASC) 5 MG tablet Take 5 mg by mouth at bedtime.     Marland Kitchen aspirin 81 MG tablet Take 81 mg by mouth at bedtime.     . carvedilol (COREG) 25 MG tablet TAKE 1 & 1/2 TABLETS BY MOUTH TWICE A DAY 90 tablet 3  . diazepam (VALIUM) 10 MG tablet TAKE 1/2 TO 1 TABLET BY MOUTH AT BEDTIMEAS NEEDED FOR ANXIETY OR RINGING IN EARS 30 tablet 1  . ferrous sulfate 325 (65 FE) MG tablet TAKE 1  TABLET BY MOUTH ONCE A DAY WITH BREAKFAST (Patient taking differently: TAKE 1 TABLET BY MOUTH ONCE at night) 90 tablet 1  . Insulin Disposable Pump (V-GO 20) KIT USE DAILY AS DIRECTED 30 kit 3  . Insulin Pen Needle (PEN NEEDLES) 31G X 5 MM MISC Inject 15 Units as directed 2 (two) times daily. Use pen needles for pt insulin pen. Dx E11.29 100 each 5  . Multiple Vitamins-Minerals (BONE SMART PO) Take 1 tablet by mouth daily.    . nitroGLYCERIN (NITROSTAT) 0.4 MG SL tablet Place 1 tablet (0.4 mg total) under the tongue every 5 (five) minutes as needed. For chest pain 25 tablet 11  . NOVOLOG 100 UNIT/ML injection USE MAXIMUM OF 56 UNITS PER DAY  WITH V-GO PUMP 20 mL 2  . Nutritional Supplements (QUINOA KALE & HEMP PO) Take by mouth daily.    Glory Rosebush DELICA LANCETS FINE MISC Use to check blood sugar 3 times per day dx code 250.42 100 each 3  . ONETOUCH VERIO test strip USE AS DIRECTED TO CHECK BLOOD SUGAR THREE TIMES PER DAY 100 each 3  . pregabalin (LYRICA) 25 MG capsule Take 1 capsule (25 mg total) by mouth 2 (two) times daily. 30 capsule 3  . rosuvastatin (CRESTOR) 40 MG tablet TAKE 1 TABLET BY MOUTH ONCE A DAY 90 tablet 3  . spironolactone (ALDACTONE) 25 MG tablet Take 0.5 tablets (12.5 mg total) by mouth daily. (Patient taking differently: Take 12.5 mg by mouth at bedtime. ) 45 tablet 3  . torsemide (DEMADEX) 20 MG tablet Take 1 tablet (20 mg total) by mouth every other day. 15 tablet 3  . VICTOZA 18 MG/3ML SOPN Inject 0.2 mLs (1.2 mg total) into the skin daily. Inject once daily at the same time (Patient taking differently: Inject 1.2 mg into the skin daily. Only takes when blood sugar is high) 2 pen 3  . cephALEXin (KEFLEX) 500 MG capsule Take 1 capsule (500 mg total) by mouth 4 (four) times daily. (Patient not taking: Reported on 05/13/2016) 28 capsule 0    Allergies:  Allergies  Allergen Reactions  . Sulfa Antibiotics Swelling and Rash    Hands swell    Family History  Problem Relation Age of Onset  . Heart disease Father     MI age 30  . Prostate cancer Father   . Diabetes Sister   . Hyperlipidemia Sister   . Cancer Maternal Grandmother     liver, cirrhosis  . Depression Neg Hx   . Alcohol abuse Neg Hx   . Drug abuse Neg Hx   . Stroke Neg Hx   . Colon cancer Neg Hx     Social History:  reports that he has quit smoking. His smoking use included Cigarettes. He has a 17.50 pack-year smoking history. He has never used smokeless tobacco. He reports that he drinks about 4.2 oz of alcohol per week . He reports that he does not use drugs.  Review of Systems: negative except Limited by today's history.   Blood  pressure 132/75, pulse 90, temperature 98.4 F (36.9 C), temperature source Oral, resp. rate 17, height _0  (1.702 m), weight 108.7 kg (239 lb 11.2 oz), SpO2 96 %. Head: Normocephalic, without obvious abnormality, atraumatic Neck: no adenopathy, no carotid bruit, no JVD, supple, symmetrical, trachea midline and thyroid not enlarged, symmetric, no tenderness/mass/nodules Resp: clear to auscultation bilaterally Cardio: regular rate and rhythm, S1, S2 normal, no murmur, click, rub or gallop GI: Abdomen distended symmetric  no obvious organomegaly. Somewhat tympanitic. Extremities: extremities normal, atraumatic, no cyanosis or edema  Results for orders placed or performed during the hospital encounter of 05/13/16 (from the past 48 hour(s))  CBC with Differential     Status: Abnormal   Collection Time: 05/13/16 11:59 AM  Result Value Ref Range   WBC 12.9 (H) 4.0 - 10.5 K/uL   RBC 3.40 (L) 4.22 - 5.81 MIL/uL   Hemoglobin 9.5 (L) 13.0 - 17.0 g/dL   HCT 30.0 (L) 39.0 - 52.0 %   MCV 88.2 78.0 - 100.0 fL   MCH 27.9 26.0 - 34.0 pg   MCHC 31.7 30.0 - 36.0 g/dL   RDW 15.2 11.5 - 15.5 %   Platelets 268 150 - 400 K/uL   Neutrophils Relative % 86 %   Neutro Abs 11.1 (H) 1.7 - 7.7 K/uL   Lymphocytes Relative 7 %   Lymphs Abs 0.9 0.7 - 4.0 K/uL   Monocytes Relative 7 %   Monocytes Absolute 0.9 0.1 - 1.0 K/uL   Eosinophils Relative 0 %   Eosinophils Absolute 0.0 0.0 - 0.7 K/uL   Basophils Relative 0 %   Basophils Absolute 0.0 0.0 - 0.1 K/uL  Basic metabolic panel     Status: Abnormal   Collection Time: 05/13/16 11:59 AM  Result Value Ref Range   Sodium 138 135 - 145 mmol/L   Potassium 4.3 3.5 - 5.1 mmol/L   Chloride 101 101 - 111 mmol/L   CO2 28 22 - 32 mmol/L   Glucose, Bld 176 (H) 65 - 99 mg/dL   BUN 34 (H) 6 - 20 mg/dL   Creatinine, Ser 2.17 (H) 0.61 - 1.24 mg/dL   Calcium 9.8 8.9 - 10.3 mg/dL   GFR calc non Af Amer 27 (L) >60 mL/min   GFR calc Af Amer 32 (L) >60 mL/min    Comment:  (NOTE) The eGFR has been calculated using the CKD EPI equation. This calculation has not been validated in all clinical situations. eGFR's persistently <60 mL/min signify possible Chronic Kidney Disease.    Anion gap 9 5 - 15  Urinalysis, Routine w reflex microscopic- may I&O cath if menses     Status: Abnormal   Collection Time: 05/13/16  1:16 PM  Result Value Ref Range   Color, Urine YELLOW YELLOW   APPearance HAZY (A) CLEAR   Specific Gravity, Urine 1.013 1.005 - 1.030   pH 6.0 5.0 - 8.0   Glucose, UA NEGATIVE NEGATIVE mg/dL   Hgb urine dipstick NEGATIVE NEGATIVE   Bilirubin Urine NEGATIVE NEGATIVE   Ketones, ur NEGATIVE NEGATIVE mg/dL   Protein, ur 100 (A) NEGATIVE mg/dL   Nitrite NEGATIVE NEGATIVE   Leukocytes, UA NEGATIVE NEGATIVE   RBC / HPF 0-5 0 - 5 RBC/hpf   WBC, UA 0-5 0 - 5 WBC/hpf   Bacteria, UA RARE (A) NONE SEEN   Squamous Epithelial / LPF 0-5 (A) NONE SEEN   Hyaline Casts, UA PRESENT   Brain natriuretic peptide     Status: Abnormal   Collection Time: 05/13/16  6:00 PM  Result Value Ref Range   B Natriuretic Peptide 488.0 (H) 0.0 - 100.0 pg/mL  Hepatic function panel     Status: Abnormal   Collection Time: 05/13/16  6:00 PM  Result Value Ref Range   Total Protein 7.1 6.5 - 8.1 g/dL   Albumin 3.2 (L) 3.5 - 5.0 g/dL   AST 14 (L) 15 - 41 U/L   ALT 10 (L) 17 -  63 U/L   Alkaline Phosphatase 44 38 - 126 U/L   Total Bilirubin 0.5 0.3 - 1.2 mg/dL   Bilirubin, Direct 0.1 0.1 - 0.5 mg/dL   Indirect Bilirubin 0.4 0.3 - 0.9 mg/dL  Strep pneumoniae urinary antigen     Status: None   Collection Time: 05/13/16  6:00 PM  Result Value Ref Range   Strep Pneumo Urinary Antigen NEGATIVE NEGATIVE    Comment:        Infection due to S. pneumoniae cannot be absolutely ruled out since the antigen present may be below the detection limit of the test. Performed at [facility], [address], [place], [place] [address]   Sodium, urine, random     Status: None    Collection Time: 05/13/16  6:00 PM  Result Value Ref Range   Sodium, Ur 59 mmol/L    Comment: Performed at [facility] [address] [place], [place] [address]  Creatinine, urine, random     Status: None   Collection Time: 05/13/16  6:00 PM  Result Value Ref Range   Creatinine, Urine 81.89 mg/dL    Comment: Performed at [facility], [place] [address] [place], [place] [address]  I-Stat Troponin, ED (not at [facility])     Status: None   Collection Time: 05/13/16  6:10 PM  Result Value Ref Range   Troponin i, poc 0.00 0.00 - 0.08 ng/mL   Comment 3            Comment: Due to the release kinetics of cTnI, a negative result within the first hours of the onset of symptoms does not rule out myocardial infarction with certainty. If myocardial infarction is still suspected, repeat the test at appropriate intervals.   Vitamin B12     Status: None   Collection Time: 05/13/16  8:40 PM  Result Value Ref Range   Vitamin B-12 328 180 - 914 pg/mL    Comment: (NOTE) This assay is not validated for testing neonatal or myeloproliferative syndrome specimens for Vitamin B12 levels. Performed at [facility], [place] [address] [place], [place] [address]   Folate     Status: None   Collection Time: 05/13/16  8:40 PM  Result Value Ref Range   Folate 14.8 >5.9 ng/mL    Comment: Performed at [facility] [address], [place], [place] [address]  Iron and TIBC     Status: Abnormal   Collection Time: 05/13/16  8:40 PM  Result Value Ref Range   Iron 15 (L) 45 - 182 ug/dL   TIBC 234 (L) 250 - 450 ug/dL   Saturation Ratios 6 (L) 17.9 - 39.5 %   UIBC 219 ug/dL    Comment: Performed at [facility] [address], [place], [place] [address]  Ferritin     Status: Abnormal   Collection Time: 05/13/16  8:40 PM  Result Value Ref Range   Ferritin 366 (H) 24 - 336 ng/mL    Comment: Performed at [facility] [address], [place], [place] [address]  Reticulocytes      Status: Abnormal   Collection Time: 05/13/16  8:40 PM  Result Value Ref Range   Retic Ct Pct 1.5 0.4 - 3.1 %   RBC. 3.31 (L) 4.22 - 5.81 MIL/uL   Retic Count, Manual 49.7 19.0 - 186.0 K/uL  Magnesium     Status: None   Collection Time: 05/13/16  8:40 PM  Result Value Ref Range   Magnesium 2.1 1.7 - 2.4 mg/dL  Glucose, capillary     Status: Abnormal   Collection Time: 05/13/16 10:28 PM  Result Value Ref Range   Glucose-Capillary 146 (H) 65 - 99 mg/dL   Comment 1 Notify RN   Glucose, capillary     Status: Abnormal   Collection Time: 05/14/16  2:41 AM  Result Value Ref Range   Glucose-Capillary 111 (H) 65 - 99 mg/dL   Comment 1 Notify RN   Lactate dehydrogenase     Status: None   Collection Time: 05/14/16  4:33 AM  Result Value Ref Range   LDH 155 98 - 192 U/L  Comprehensive metabolic panel     Status: Abnormal   Collection Time: 05/14/16  4:33 AM  Result Value Ref Range   Sodium 140 135 - 145 mmol/L   Potassium 4.2 3.5 - 5.1 mmol/L   Chloride 104 101 - 111 mmol/L   CO2 32 22 - 32 mmol/L   Glucose, Bld 120 (H) 65 - 99 mg/dL   BUN 29 (H) 6 - 20 mg/dL   Creatinine, Ser 1.94 (H) 0.61 - 1.24 mg/dL   Calcium 9.3 8.9 - 10.3 mg/dL   Total Protein 6.6 6.5 - 8.1 g/dL   Albumin 3.1 (L) 3.5 - 5.0 g/dL   AST 13 (L) 15 - 41 U/L   ALT 12 (L) 17 - 63 U/L   Alkaline Phosphatase 42 38 - 126 U/L   Total Bilirubin 0.4 0.3 - 1.2 mg/dL   GFR calc non Af Amer 31 (L) >60 mL/min   GFR calc Af Amer 36 (L) >60 mL/min    Comment: (NOTE) The eGFR has been calculated using the CKD EPI equation. This calculation has not been validated in all clinical situations. eGFR's persistently <60 mL/min signify possible Chronic Kidney Disease.    Anion gap 4 (L) 5 - 15  Glucose, capillary     Status: Abnormal   Collection Time: 05/14/16  6:08 AM  Result Value Ref Range   Glucose-Capillary 107 (H) 65 - 99 mg/dL   Comment 1 Notify RN   Glucose, capillary     Status: Abnormal   Collection Time: 05/14/16   8:09 AM  Result Value Ref Range   Glucose-Capillary 122 (H) 65 - 99 mg/dL   Ct Abdomen Pelvis Wo Contrast  Result Date: 05/13/2016 CLINICAL DATA:  Abdominal pain and constipation EXAM: CT ABDOMEN AND PELVIS WITHOUT CONTRAST TECHNIQUE: Multidetector CT imaging of the abdomen and pelvis was performed following the standard protocol without IV contrast. COMPARISON:  None. FINDINGS: Lower chest: Left lung base is clear. The right lung base demonstrates a large pleural effusion with complete consolidation of the right lower lobe. Patchy density is noted within the collapsed lung. The possibility of underlying neoplasm could not be totally excluded on the basis of this exam. Hepatobiliary: Dependent small gallstones are noted. The gallbladder and liver are otherwise within normal limits. Pancreas: Unremarkable. No pancreatic ductal dilatation or surrounding inflammatory changes. Spleen: Normal in size without focal abnormality. Adrenals/Urinary Tract: Adrenal glands are within normal limits. Scattered renal vascular calcifications are seen. A few nonobstructing stones are noted measuring less than 5 mm. The ureters are well visualized bilaterally. The bladder has been decompressed by Foley catheter. Stomach/Bowel: The appendix has been surgically removed. Mild fecal material is noted within the colon although no findings to suggest, significant constipation or obstruction are noted. No inflammatory changes are seen. Vascular/Lymphatic: Aortic atherosclerosis. No enlarged  abdominal or pelvic lymph nodes. Reproductive: Prostate is mildly enlarged with diffuse calcifications. Other: No abdominal wall hernia or abnormality. No abdominopelvic ascites. Musculoskeletal: No acute or significant osseous findings. IMPRESSION: Large right pleural effusion with right lower lobe and middle lobe consolidation. Varying densities are seen within the collapsed right lung. Possibility of neoplasm could not be totally excluded. Small  gallstones without complicating factors. Small bilateral nonobstructing renal stones measuring less than 5 mm. Electronically Signed   By: Inez Catalina M.D.   On: 05/13/2016 16:15   Dg Chest 2 View  Result Date: 05/13/2016 CLINICAL DATA:  Pleural effusion. EXAM: CHEST  2 VIEW COMPARISON:  04/04/2015 FINDINGS: Large right pleural effusion. Fall a small amount of aerated right upper lobe noted. There is cardiomegaly. Prior CABG. Left AICD is unchanged. No confluent opacity or effusion on the left. IMPRESSION: Large right pleural effusion with only a small amount of aerated right upper lobe. Cardiomegaly. Electronically Signed   By: Rolm Baptise M.D.   On: 05/13/2016 17:41   Ct Chest Wo Contrast  Result Date: 05/13/2016 CLINICAL DATA:  Hypoxia.  Right pleural effusion. EXAM: CT CHEST WITHOUT CONTRAST TECHNIQUE: Multidetector CT imaging of the chest was performed following the standard protocol without IV contrast. COMPARISON:  05/13/2016 CXR and CT abdomen and pelvis exams. FINDINGS: Cardiovascular: Status post CABG. Top normal size cardiac chambers with right atrial, coronary sinus and right ventricular leads in place. Left anterior chest wall AICD device. Aortic atherosclerosis without aneurysm. No pericardial effusion. Mediastinum/Nodes: Atherosclerosis of the great vessels. No thyroid mass or thyromegaly. Trachea and mainstem bronchi appear patent. No esophageal abnormality identified. Small mediastinal lymph nodes the largest is 10 mm right lower paratracheal. Lungs/Pleura: Large right pleural effusion with compressive atelectasis sparing only a small portion of the anterior right upper lobe. Left lung is clear apart from some minimal atelectasis. Upper Abdomen: Contracted gallbladder with gallstones noted within. Splenic calcifications consistent with granulomas. No biliary dilatation. Mild adrenal gland thickening without nodularity. No acute abnormality of the visualized pancreas. Mild bilateral  perinephric fat stranding. Musculoskeletal: No acute nor suspicious osseous abnormality. IMPRESSION: Large right pleural effusion with compressive atelectasis. Etiology is uncertain. Underlying malignancy is not entirely excluded. Clear left lung. Status post CABG with AICD device and leads as above described. Uncomplicated cholelithiasis. Electronically Signed   By: Ashley Royalty M.D.   On: 05/13/2016 22:27   Dg Abd 2 Views  Result Date: 05/13/2016 CLINICAL DATA:  Constipation.  No bowel movement for 8 days. EXAM: ABDOMEN - 2 VIEW COMPARISON:  None. FINDINGS: No evidence of bowel obstruction. No visible significant increase in stool burden. No organomegaly or suspicious calcification. No free air. IMPRESSION: No acute findings. Electronically Signed   By: Rolm Baptise M.D.   On: 05/13/2016 12:02    Assessment: Pneumonia Anemia Possible GI bleeding Fecal impaction Plan:  Await Hemoccults Await fecal disimpaction Empiric PPI Return and discuss endoscopic workup with him when wife is present. No need to restrict diet today. Jeromiah Ohalloran C 05/14/2016, 11:40 AM  Pager [phone] If no answer or after 5 PM call [phone]

## 2016-05-14 NOTE — Progress Notes (Signed)
PROGRESS NOTE    Christepher Melchior  VQM:[id number] DOB: 1934-05-17 DOA: 05/13/2016 PCP: Elsie Stain, MD   Brief Narrative: Izak Anding is a 82 y.o. male with medical history significant of past medical history of coronary artery disease (ischemic cardiomyopathy) status post CABG with a last EF in 2017 that showed an EF of 35%, and a kidney disease stage III, insulin-dependent diabetes mellitus anemia of chronic diseases   Assessment & Plan:   Active Problems:   Chronic systolic CHF (congestive heart failure) (HCC)   Stage 3 chronic renal impairment associated with type 2 diabetes mellitus (HCC)   Anemia, iron deficiency   Pleural effusion on right   CAP (community acquired pneumonia)   Microcytic anemia   Melena   Fecal impaction (HCC)   Acute respiratory failure with hypoxia (HCC)   AKI (acute kidney injury) ([place])   CKD (chronic kidney disease), stage III   Acute respiratory failure Community acquired pneumonia Right pleural effusion S/p thoracentesis today with 2L removed. -continue Ceftriaxone and azithromycin -follow-up body fluid labs  Fecal impaction -SMOG enema  AKI on CKD stage III Improved. Baseline around 1.8 to 2.0  Microcytic anemia Melena No history of colonoscopy. CT scan not significant for obvious masses. GI consulted -GI recommendations  Diabetes mellitus type 2 -continue Lantus -continue SSI  S/p CABG  Chronic systolic CHF Euvolemic. AICD in place. -continue to hold aldactone and Demadex in setting of AKI  Urinary retension Possibly secondary to fecal impaction. Foley in place. Can attempt voiding trial after impaction cleared   DVT prophylaxis: SCDs Code Status: Full code Family Communication: None at bedside Disposition Plan: Discharge in 2-3 days   Consultants:   Gastroenterology  Procedures:   None  Antimicrobials:   Ceftriaxone  Azithromycin    Subjective: Patient reports no dyspnea or chest  pain.  Objective: Vitals:   05/13/16 1800 05/13/16 1908 05/13/16 2108 05/14/16 0609  BP:  113/65 109/64 132/75  Pulse:  90 86 90  Resp: '20 16 16 17  '$ Temp:  98 F (36.7 C) 98.3 F (36.8 C) 98.4 F (36.9 C)  TempSrc:  Oral Oral Oral  SpO2:  99% 98% 96%  Weight:  108.7 kg (239 lb 11.2 oz)    Height:  '5\' 7"'$  (1.702 m)      Intake/Output Summary (Last 24 hours) at 05/14/16 1500 Last data filed at 05/14/16 1330  Gross per 24 hour  Intake              120 ml  Output             2100 ml  Net            -1980 ml   Filed Weights   05/13/16 1908  Weight: 108.7 kg (239 lb 11.2 oz)    Examination:  General exam: Appears calm and comfortable  Respiratory system: Decreased breath sounds in RLL and RML. Takes multiple breaths during sentences Cardiovascular system: S1 & S2 heard, Normal rate with regular rhythm. Gastrointestinal system: Abdomen is nondistended, soft and nontender. Normal bowel sounds heard. Central nervous system: Alert and oriented. No focal neurological deficits. Extremities: No edema. No calf tenderness Skin: No cyanosis. No rashes Psychiatry: Judgement and insight appear normal. Mood & affect appropriate.     Data Reviewed: I have personally reviewed following labs and imaging studies  CBC:  Recent Labs Lab 05/13/16 1159  WBC 12.9*  NEUTROABS 11.1*  HGB 9.5*  HCT 30.0*  MCV 88.2  PLT 268  Basic Metabolic Panel:  Recent Labs Lab 05/07/16 1504 05/13/16 1159 05/13/16 2040 05/14/16 0433  NA 137 138  --  140  K 4.0 4.3  --  4.2  CL 98 101  --  104  CO2 31 28  --  32  GLUCOSE 166* 176*  --  120*  BUN 21 34*  --  29*  CREATININE 1.56* 2.17*  --  1.94*  CALCIUM 10.2 9.8  --  9.3  MG  --   --  2.1  --    GFR: Estimated Creatinine Clearance: 36.3 mL/min (by C-G formula based on SCr of 1.94 mg/dL (H)). Liver Function Tests:  Recent Labs Lab 05/07/16 1504 05/13/16 1800 05/14/16 0433  AST 8 14* 13*  ALT 10 10* 12*  ALKPHOS 45 44 42   BILITOT 0.4 0.5 0.4  PROT 7.1 7.1 6.6  ALBUMIN 3.6 3.2* 3.1*   No results for input(s): LIPASE, AMYLASE in the last 168 hours. No results for input(s): AMMONIA in the last 168 hours. Coagulation Profile: No results for input(s): INR, PROTIME in the last 168 hours. Cardiac Enzymes: No results for input(s): CKTOTAL, CKMB, CKMBINDEX, TROPONINI in the last 168 hours. BNP (last 3 results)  Recent Labs  04/08/16 1529  PROBNP 511.0*   HbA1C: No results for input(s): HGBA1C in the last 72 hours. CBG:  Recent Labs Lab 05/13/16 2228 05/14/16 0241 05/14/16 0608 05/14/16 0809 05/14/16 1326  GLUCAP 146* 111* 107* 122* 116*   Lipid Profile: No results for input(s): CHOL, HDL, LDLCALC, TRIG, CHOLHDL, LDLDIRECT in the last 72 hours. Thyroid Function Tests: No results for input(s): TSH, T4TOTAL, FREET4, T3FREE, THYROIDAB in the last 72 hours. Anemia Panel:  Recent Labs  05/13/16 2040  VITAMINB12 328  FOLATE 14.8  FERRITIN 366*  TIBC 234*  IRON 15*  RETICCTPCT 1.5   Sepsis Labs: No results for input(s): PROCALCITON, LATICACIDVEN in the last 168 hours.  No results found for this or any previous visit (from the past 240 hour(s)).       Radiology Studies: Ct Abdomen Pelvis Wo Contrast  Result Date: 05/13/2016 CLINICAL DATA:  Abdominal pain and constipation EXAM: CT ABDOMEN AND PELVIS WITHOUT CONTRAST TECHNIQUE: Multidetector CT imaging of the abdomen and pelvis was performed following the standard protocol without IV contrast. COMPARISON:  None. FINDINGS: Lower chest: Left lung base is clear. The right lung base demonstrates a large pleural effusion with complete consolidation of the right lower lobe. Patchy density is noted within the collapsed lung. The possibility of underlying neoplasm could not be totally excluded on the basis of this exam. Hepatobiliary: Dependent small gallstones are noted. The gallbladder and liver are otherwise within normal limits. Pancreas:  Unremarkable. No pancreatic ductal dilatation or surrounding inflammatory changes. Spleen: Normal in size without focal abnormality. Adrenals/Urinary Tract: Adrenal glands are within normal limits. Scattered renal vascular calcifications are seen. A few nonobstructing stones are noted measuring less than 5 mm. The ureters are well visualized bilaterally. The bladder has been decompressed by Foley catheter. Stomach/Bowel: The appendix has been surgically removed. Mild fecal material is noted within the colon although no findings to suggest, significant constipation or obstruction are noted. No inflammatory changes are seen. Vascular/Lymphatic: Aortic atherosclerosis. No enlarged abdominal or pelvic lymph nodes. Reproductive: Prostate is mildly enlarged with diffuse calcifications. Other: No abdominal wall hernia or abnormality. No abdominopelvic ascites. Musculoskeletal: No acute or significant osseous findings. IMPRESSION: Large right pleural effusion with right lower lobe and middle lobe consolidation. Varying densities are seen  within the collapsed right lung. Possibility of neoplasm could not be totally excluded. Small gallstones without complicating factors. Small bilateral nonobstructing renal stones measuring less than 5 mm. Electronically Signed   By: Inez Catalina M.D.   On: 05/13/2016 16:15   Dg Chest 1 View  Result Date: 05/14/2016 CLINICAL DATA:  Right thoracentesis EXAM: CHEST 1 VIEW COMPARISON:  CT chest of 05/13/2016 and chest x-ray of 05/13/2016 FINDINGS: After right thoracentesis, the volume of right pleural effusion has diminished. There is a tiny right apical pneumothorax present of less than 5%. The left lung is clear. Cardiomegaly is stable and AICD leads remain. IMPRESSION: 1. Tiny right apical pneumothorax after right thoracentesis. 2. Decrease in volume of right pleural effusion. Electronically Signed   By: Ivar Drape M.D.   On: 05/14/2016 11:47   Dg Chest 2 View  Result Date:  05/13/2016 CLINICAL DATA:  Pleural effusion. EXAM: CHEST  2 VIEW COMPARISON:  04/04/2015 FINDINGS: Large right pleural effusion. Fall a small amount of aerated right upper lobe noted. There is cardiomegaly. Prior CABG. Left AICD is unchanged. No confluent opacity or effusion on the left. IMPRESSION: Large right pleural effusion with only a small amount of aerated right upper lobe. Cardiomegaly. Electronically Signed   By: Rolm Baptise M.D.   On: 05/13/2016 17:41   Ct Chest Wo Contrast  Result Date: 05/13/2016 CLINICAL DATA:  Hypoxia.  Right pleural effusion. EXAM: CT CHEST WITHOUT CONTRAST TECHNIQUE: Multidetector CT imaging of the chest was performed following the standard protocol without IV contrast. COMPARISON:  05/13/2016 CXR and CT abdomen and pelvis exams. FINDINGS: Cardiovascular: Status post CABG. Top normal size cardiac chambers with right atrial, coronary sinus and right ventricular leads in place. Left anterior chest wall AICD device. Aortic atherosclerosis without aneurysm. No pericardial effusion. Mediastinum/Nodes: Atherosclerosis of the great vessels. No thyroid mass or thyromegaly. Trachea and mainstem bronchi appear patent. No esophageal abnormality identified. Small mediastinal lymph nodes the largest is 10 mm right lower paratracheal. Lungs/Pleura: Large right pleural effusion with compressive atelectasis sparing only a small portion of the anterior right upper lobe. Left lung is clear apart from some minimal atelectasis. Upper Abdomen: Contracted gallbladder with gallstones noted within. Splenic calcifications consistent with granulomas. No biliary dilatation. Mild adrenal gland thickening without nodularity. No acute abnormality of the visualized pancreas. Mild bilateral perinephric fat stranding. Musculoskeletal: No acute nor suspicious osseous abnormality. IMPRESSION: Large right pleural effusion with compressive atelectasis. Etiology is uncertain. Underlying malignancy is not entirely  excluded. Clear left lung. Status post CABG with AICD device and leads as above described. Uncomplicated cholelithiasis. Electronically Signed   By: Ashley Royalty M.D.   On: 05/13/2016 22:27   Dg Abd 2 Views  Result Date: 05/13/2016 CLINICAL DATA:  Constipation.  No bowel movement for 8 days. EXAM: ABDOMEN - 2 VIEW COMPARISON:  None. FINDINGS: No evidence of bowel obstruction. No visible significant increase in stool burden. No organomegaly or suspicious calcification. No free air. IMPRESSION: No acute findings. Electronically Signed   By: Rolm Baptise M.D.   On: 05/13/2016 12:02   US Thoracentesis Asp Pleural Space W/img Guide  Result Date: 05/14/2016 INDICATION: Patient with history of coronary artery disease, prior CABG, weight loss, chronic kidney disease, CHF, prior smoker, large right pleural effusion. Request made for diagnostic and therapeutic right thoracentesis. EXAM: ULTRASOUND GUIDED DIAGNOSTIC AND THERAPEUTIC RIGHT THORACENTESIS MEDICATIONS: None. COMPLICATIONS: None immediate. PROCEDURE: An ultrasound guided thoracentesis was thoroughly discussed with the patient and questions answered. The benefits, risks, alternatives  and complications were also discussed. The patient understands and wishes to proceed with the procedure. Written consent was obtained. Ultrasound was performed to localize and mark an adequate pocket of fluid in the right chest. The area was then prepped and draped in the normal sterile fashion. 1% Lidocaine was used for local anesthesia. Under ultrasound guidance a Safe-T-Centesis catheter was introduced. Thoracentesis was performed. The catheter was removed and a dressing applied. FINDINGS: A total of approximately 2 liters of turbid, amber fluid was removed. Samples were sent to the laboratory as requested by the clinical team. IMPRESSION: Successful ultrasound guided diagnostic and therapeutic right thoracentesis yielding 2 liters of pleural fluid. Read by: Rowe Robert, PA-C  Electronically Signed   By: Corrie Mckusick D.O.   On: 05/14/2016 13:02        Scheduled Meds: . acetaminophen  1,000 mg Oral Daily  . amLODipine  5 mg Oral QHS  . azithromycin  500 mg Oral Q24H  . carvedilol  12.5 mg Oral BID WC  . cefTRIAXone (ROCEPHIN)  IV  1 g Intravenous Q24H  . insulin aspart  0-5 Units Subcutaneous QHS  . insulin aspart  0-9 Units Subcutaneous TID WC  . insulin aspart  3 Units Subcutaneous TID WC  . insulin detemir  5 Units Subcutaneous QHS  . pregabalin  25 mg Oral BID  . rosuvastatin  40 mg Oral Daily  . sorbitol, milk of mag, mineral oil, glycerin (SMOG) enema  960 mL Rectal BID   Continuous Infusions: . sodium chloride 50 mL/hr at 05/13/16 1930     LOS: 1 day     Cordelia Poche [facility] 05/14/2016, 3:00 PM Pager: [phone]  If 7PM-7AM, please contact night-coverage [email or web address] Password [email or web address] 05/14/2016, 3:00 PM

## 2016-05-14 NOTE — Procedures (Signed)
Ultrasound-guided diagnostic and therapeutic right thoracentesis performed yielding 2 liters of turbid, amber colored fluid. No immediate complications. Follow-up chest x-ray pending. The fluid was sent to the lab for preordered studies.

## 2016-05-15 ENCOUNTER — Inpatient Hospital Stay (HOSPITAL_COMMUNITY): Payer: PPO

## 2016-05-15 ENCOUNTER — Encounter (HOSPITAL_COMMUNITY): Payer: PPO

## 2016-05-15 LAB — BASIC METABOLIC PANEL
ANION GAP: 7 (ref 5–15)
BUN: 26 mg/dL — ABNORMAL HIGH (ref 6–20)
CALCIUM: 8.6 mg/dL — AB (ref 8.9–10.3)
CO2: 28 mmol/L (ref 22–32)
CREATININE: 2.32 mg/dL — AB (ref 0.61–1.24)
Chloride: 100 mmol/L — ABNORMAL LOW (ref 101–111)
GFR, EST AFRICAN AMERICAN: 29 mL/min — AB (ref >60)
GFR, EST NON AFRICAN AMERICAN: 25 mL/min — AB (ref >60)
Glucose, Bld: 272 mg/dL — ABNORMAL HIGH (ref 65–99)
Potassium: 4.6 mmol/L (ref 3.5–5.1)
SODIUM: 135 mmol/L (ref 135–145)

## 2016-05-15 LAB — GLUCOSE, CAPILLARY
GLUCOSE-CAPILLARY: 224 mg/dL — AB (ref 65–99)
GLUCOSE-CAPILLARY: 270 mg/dL — AB (ref 65–99)
Glucose-Capillary: 169 mg/dL — ABNORMAL HIGH (ref 65–99)
Glucose-Capillary: 273 mg/dL — ABNORMAL HIGH (ref 65–99)

## 2016-05-15 LAB — CBC
HCT: 30.1 % — ABNORMAL LOW (ref 39.0–52.0)
HEMOGLOBIN: 9.3 g/dL — AB (ref 13.0–17.0)
MCH: 27.9 pg (ref 26.0–34.0)
MCHC: 30.9 g/dL (ref 30.0–36.0)
MCV: 90.4 fL (ref 78.0–100.0)
PLATELETS: 249 10*3/uL (ref 150–400)
RBC: 3.33 MIL/uL — AB (ref 4.22–5.81)
RDW: 15.4 % (ref 11.5–15.5)
WBC: 8.7 10*3/uL (ref 4.0–10.5)

## 2016-05-15 LAB — HEMOGLOBIN A1C
Hgb A1c MFr Bld: 6.9 % — ABNORMAL HIGH (ref 4.8–5.6)
Mean Plasma Glucose: 151 mg/dL

## 2016-05-15 LAB — URINE CULTURE: Culture: NO GROWTH

## 2016-05-15 MED ORDER — ACETAMINOPHEN 325 MG PO TABS
650.0000 mg | ORAL_TABLET | Freq: Four times a day (QID) | ORAL | Status: DC | PRN
  Administered 2016-05-16: 650 mg via ORAL
  Filled 2016-05-15: qty 2

## 2016-05-15 MED ORDER — SODIUM CHLORIDE 0.9 % IV BOLUS (SEPSIS)
500.0000 mL | Freq: Once | INTRAVENOUS | Status: AC
Start: 2016-05-15 — End: 2016-05-15
  Administered 2016-05-15: 500 mL via INTRAVENOUS

## 2016-05-15 NOTE — Progress Notes (Signed)
PROGRESS NOTE    [name] [name]  NIO:[id number] DOB: [birth year] DOA: 05/13/2016 PCP: Elsie Stain, MD   Brief Narrative: [name] [name] is a 82 y.o. male with medical history significant of past medical history of coronary artery disease (ischemic cardiomyopathy) status post CABG with a last EF in 2017 that showed an EF of 35%, and a kidney disease stage III, insulin-dependent diabetes mellitus anemia of chronic diseases   Assessment & Plan:   Active Problems:   Chronic systolic CHF (congestive heart failure) (HCC)   Stage 3 chronic renal impairment associated with type 2 diabetes mellitus (HCC)   Anemia, iron deficiency   Pleural effusion on right   CAP (community acquired pneumonia)   Microcytic anemia   Melena   Fecal impaction (HCC)   Acute respiratory failure with hypoxia (HCC)   AKI (acute kidney injury) ([place])   CKD (chronic kidney disease), stage III   Acute respiratory failure Community acquired pneumonia Right pleural effusion S/p thoracentesis yesterday with 2L removed. Fluid appears exudative. No bacteria. -continue Ceftriaxone and azithromycin for now -cytology pending -culture pending  Fecal impaction Had a large bowel movement after enema  AKI on CKD stage III Worsened overnight. Baseline around 1.8 to 2.0 -give 535m NS bolus  Microcytic anemia Melena No history of colonoscopy. CT scan not significant for obvious masses. GI consulted -GI recommendations  Diabetes mellitus type 2 -continue Lantus -continue SSI  S/p CABG  Chronic systolic CHF Euvolemic. AICD in place. -continue to hold aldactone and Demadex in setting of AKI  Urinary retension Possibly secondary to fecal impaction. Foley in place. -remove foley and attempt voiding trial   DVT prophylaxis: SCDs Code Status: Full code Family Communication: None at bedside Disposition Plan: Discharge in 2-3 days   Consultants:   Gastroenterology  Procedures:   None  Antimicrobials:     Ceftriaxone  Azithromycin   Subjective: No dyspnea or chest pain. Curious to know results of cytology. Had large bowel movements yesterday.  Objective: Vitals:   05/14/16 0609 05/14/16 1515 05/14/16 2053 05/15/16 0440  BP: 132/75 (!) 93/52 (!) 109/45 (!) 141/71  Pulse: 90 82 83 94  Resp: '17 18 16 14  '$ Temp: 98.4 F (36.9 C) 98.2 F (36.8 C) 98.5 F (36.9 C) 99 F (37.2 C)  TempSrc: Oral Oral Oral Oral  SpO2: 96% 96% 100% 96%  Weight:      Height:        Intake/Output Summary (Last 24 hours) at 05/15/16 1422 Last data filed at 05/15/16 1012  Gross per 24 hour  Intake              120 ml  Output              551 ml  Net             -431 ml   Filed Weights   05/13/16 1908  Weight: 108.7 kg (239 lb 11.2 oz)    Examination:  General exam: Appears calm and comfortable  Respiratory system: Decreased breath sounds in RLL and RML. Takes multiple breaths during sentences (baseline per wife) Cardiovascular system: S1 & S2 heard, Normal rate with regular rhythm. Gastrointestinal system: Abdomen is nondistended, soft and nontender. Normal bowel sounds heard. Central nervous system: Alert and oriented. No focal neurological deficits. Extremities: No edema. No calf tenderness Skin: No cyanosis. No rashes Psychiatry: Judgement and insight appear normal. Mood & affect appropriate.     Data Reviewed: I have personally reviewed following labs and imaging studies  CBC:  Recent Labs Lab 05/13/16 1159 05/15/16 1135  WBC 12.9* 8.7  NEUTROABS 11.1*  --   HGB 9.5* 9.3*  HCT 30.0* 30.1*  MCV 88.2 90.4  PLT 268 384   Basic Metabolic Panel:  Recent Labs Lab 05/13/16 1159 05/13/16 2040 05/14/16 0433 05/15/16 1135  NA 138  --  140 135  K 4.3  --  4.2 4.6  CL 101  --  104 100*  CO2 28  --  32 28  GLUCOSE 176*  --  120* 272*  BUN 34*  --  29* 26*  CREATININE 2.17*  --  1.94* 2.32*  CALCIUM 9.8  --  9.3 8.6*  MG  --  2.1  --   --    GFR: Estimated Creatinine  Clearance: 30.3 mL/min (by C-G formula based on SCr of 2.32 mg/dL (H)). Liver Function Tests:  Recent Labs Lab 05/13/16 1800 05/14/16 0433  AST 14* 13*  ALT 10* 12*  ALKPHOS 44 42  BILITOT 0.5 0.4  PROT 7.1 6.6  ALBUMIN 3.2* 3.1*   BNP (last 3 results)  Recent Labs  04/08/16 1529  PROBNP 511.0*   HbA1C:  Recent Labs  05/13/16 2040  HGBA1C 6.9*   CBG:  Recent Labs Lab 05/14/16 1326 05/14/16 1821 05/14/16 2129 05/15/16 0739 05/15/16 1234  GLUCAP 116* 158* 93 169* 224*   Lipid Profile: No results for input(s): CHOL, HDL, LDLCALC, TRIG, CHOLHDL, LDLDIRECT in the last 72 hours. Thyroid Function Tests: No results for input(s): TSH, T4TOTAL, FREET4, T3FREE, THYROIDAB in the last 72 hours. Anemia Panel:  Recent Labs  05/13/16 2040  VITAMINB12 328  FOLATE 14.8  FERRITIN 366*  TIBC 234*  IRON 15*  RETICCTPCT 1.5   Sepsis Labs: No results for input(s): PROCALCITON, LATICACIDVEN in the last 168 hours.  Recent Results (from the past 240 hour(s))  Urine culture     Status: None   Collection Time: 05/13/16  1:16 PM  Result Value Ref Range Status   Specimen Description URINE, CLEAN CATCH  Final   Special Requests NONE  Final   Culture   Final    NO GROWTH Performed at [facility], [address], [place], [place] [address]    Report Status 05/15/2016 FINAL  Final  Blood culture (routine x 2)     Status: None (Preliminary result)   Collection Time: 05/13/16  6:00 PM  Result Value Ref Range Status   Specimen Description BLOOD LEFT HAND  Final   Special Requests BOTTLES DRAWN AEROBIC AND ANAEROBIC 5CC  Final   Culture   Final    NO GROWTH 2 DAYS Performed at [facility], [place] [address] [place], [place] [address]    Report Status PENDING  Incomplete  Blood culture (routine x 2)     Status: None (Preliminary result)   Collection Time: 05/13/16  6:20 PM  Result Value Ref Range Status   Specimen Description BLOOD RIGHT HAND  Final   Special  Requests IN PEDIATRIC BOTTLE 2CC  Final   Culture   Final    NO GROWTH 2 DAYS Performed at [facility], [place] [address], [place], [place] [address]    Report Status PENDING  Incomplete  Culture, body fluid-bottle     Status: None (Preliminary result)   Collection Time: 05/14/16 11:29 AM  Result Value Ref Range Status   Specimen Description PLEURAL RIGHT  Final   Special Requests NONE  Final   Culture  Final    NO GROWTH < 24 HOURS Performed at [facility], [place] [address], [place], [place] [address]    Report Status PENDING  Incomplete  Gram stain     Status: None   Collection Time: 05/14/16 11:29 AM  Result Value Ref Range Status   Specimen Description PLEURAL RIGHT  Final   Special Requests NONE  Final   Gram Stain   Final    RARE WBC PRESENT,BOTH PMN AND MONONUCLEAR NO ORGANISMS SEEN Performed at [facility], [place] [address], [place], [place] [address]    Report Status 05/14/2016 FINAL  Final         Radiology Studies: Ct Abdomen Pelvis Wo Contrast  Result Date: 05/13/2016 CLINICAL DATA:  Abdominal pain and constipation EXAM: CT ABDOMEN AND PELVIS WITHOUT CONTRAST TECHNIQUE: Multidetector CT imaging of the abdomen and pelvis was performed following the standard protocol without IV contrast. COMPARISON:  None. FINDINGS: Lower chest: Left lung base is clear. The right lung base demonstrates a large pleural effusion with complete consolidation of the right lower lobe. Patchy density is noted within the collapsed lung. The possibility of underlying neoplasm could not be totally excluded on the basis of this exam. Hepatobiliary: Dependent small gallstones are noted. The gallbladder and liver are otherwise within normal limits. Pancreas: Unremarkable. No pancreatic ductal dilatation or surrounding inflammatory changes. Spleen: Normal in size without focal abnormality. Adrenals/Urinary Tract: Adrenal glands are within normal limits. Scattered renal vascular  calcifications are seen. A few nonobstructing stones are noted measuring less than 5 mm. The ureters are well visualized bilaterally. The bladder has been decompressed by Foley catheter. Stomach/Bowel: The appendix has been surgically removed. Mild fecal material is noted within the colon although no findings to suggest, significant constipation or obstruction are noted. No inflammatory changes are seen. Vascular/Lymphatic: Aortic atherosclerosis. No enlarged abdominal or pelvic lymph nodes. Reproductive: Prostate is mildly enlarged with diffuse calcifications. Other: No abdominal wall hernia or abnormality. No abdominopelvic ascites. Musculoskeletal: No acute or significant osseous findings. IMPRESSION: Large right pleural effusion with right lower lobe and middle lobe consolidation. Varying densities are seen within the collapsed right lung. Possibility of neoplasm could not be totally excluded. Small gallstones without complicating factors. Small bilateral nonobstructing renal stones measuring less than 5 mm. Electronically Signed   By: Inez Catalina M.D.   On: 05/13/2016 16:15   Dg Chest 1 View  Result Date: 05/14/2016 CLINICAL DATA:  Right thoracentesis EXAM: CHEST 1 VIEW COMPARISON:  CT chest of 05/13/2016 and chest x-ray of 05/13/2016 FINDINGS: After right thoracentesis, the volume of right pleural effusion has diminished. There is a tiny right apical pneumothorax present of less than 5%. The left lung is clear. Cardiomegaly is stable and AICD leads remain. IMPRESSION: 1. Tiny right apical pneumothorax after right thoracentesis. 2. Decrease in volume of right pleural effusion. Electronically Signed   By: Ivar Drape M.D.   On: 05/14/2016 11:47   Dg Chest 2 View  Result Date: 05/13/2016 CLINICAL DATA:  Pleural effusion. EXAM: CHEST  2 VIEW COMPARISON:  04/04/2015 FINDINGS: Large right pleural effusion. Fall a small amount of aerated right upper lobe noted. There is cardiomegaly. Prior CABG. Left AICD is  unchanged. No confluent opacity or effusion on the left. IMPRESSION: Large right pleural effusion with only a small amount of aerated right upper lobe. Cardiomegaly. Electronically Signed   By: Rolm Baptise M.D.   On: 05/13/2016 17:41   Ct Chest Wo Contrast  Result Date:  05/13/2016 CLINICAL DATA:  Hypoxia.  Right pleural effusion. EXAM: CT CHEST WITHOUT CONTRAST TECHNIQUE: Multidetector CT imaging of the chest was performed following the standard protocol without IV contrast. COMPARISON:  05/13/2016 CXR and CT abdomen and pelvis exams. FINDINGS: Cardiovascular: Status post CABG. Top normal size cardiac chambers with right atrial, coronary sinus and right ventricular leads in place. Left anterior chest wall AICD device. Aortic atherosclerosis without aneurysm. No pericardial effusion. Mediastinum/Nodes: Atherosclerosis of the great vessels. No thyroid mass or thyromegaly. Trachea and mainstem bronchi appear patent. No esophageal abnormality identified. Small mediastinal lymph nodes the largest is 10 mm right lower paratracheal. Lungs/Pleura: Large right pleural effusion with compressive atelectasis sparing only a small portion of the anterior right upper lobe. Left lung is clear apart from some minimal atelectasis. Upper Abdomen: Contracted gallbladder with gallstones noted within. Splenic calcifications consistent with granulomas. No biliary dilatation. Mild adrenal gland thickening without nodularity. No acute abnormality of the visualized pancreas. Mild bilateral perinephric fat stranding. Musculoskeletal: No acute nor suspicious osseous abnormality. IMPRESSION: Large right pleural effusion with compressive atelectasis. Etiology is uncertain. Underlying malignancy is not entirely excluded. Clear left lung. Status post CABG with AICD device and leads as above described. Uncomplicated cholelithiasis. Electronically Signed   By: Ashley Royalty M.D.   On: 05/13/2016 22:27   Dg Chest Port 1 View  Result Date:  05/15/2016 CLINICAL DATA:  Right-sided pleural effusion EXAM: PORTABLE CHEST 1 VIEW COMPARISON:  05/14/2016 FINDINGS: Cardiac shadow is enlarged. A defibrillator is again noted and stable. Postsurgical changes are again seen. Left lung remains clear. The tiny apical pneumothorax is not well appreciated on this exam although some apical capping is noted and this may represent redistribution of residual fluid. New right perihilar and basilar infiltrate is noted. This may represent some re-expansion edema or progressive infiltrate. IMPRESSION: Increased infiltrative density in the right lung. This may represent some re-expansion edema. Small residual effusion is noted. Electronically Signed   By: Inez Catalina M.D.   On: 05/15/2016 07:17   US Thoracentesis Asp Pleural Space W/img Guide  Result Date: 05/14/2016 INDICATION: Patient with history of coronary artery disease, prior CABG, weight loss, chronic kidney disease, CHF, prior smoker, large right pleural effusion. Request made for diagnostic and therapeutic right thoracentesis. EXAM: ULTRASOUND GUIDED DIAGNOSTIC AND THERAPEUTIC RIGHT THORACENTESIS MEDICATIONS: None. COMPLICATIONS: None immediate. PROCEDURE: An ultrasound guided thoracentesis was thoroughly discussed with the patient and questions answered. The benefits, risks, alternatives and complications were also discussed. The patient understands and wishes to proceed with the procedure. Written consent was obtained. Ultrasound was performed to localize and mark an adequate pocket of fluid in the right chest. The area was then prepped and draped in the normal sterile fashion. 1% Lidocaine was used for local anesthesia. Under ultrasound guidance a Safe-T-Centesis catheter was introduced. Thoracentesis was performed. The catheter was removed and a dressing applied. FINDINGS: A total of approximately 2 liters of turbid, amber fluid was removed. Samples were sent to the laboratory as requested by the clinical team.  IMPRESSION: Successful ultrasound guided diagnostic and therapeutic right thoracentesis yielding 2 liters of pleural fluid. Read by: Rowe Robert, PA-C Electronically Signed   By: Corrie Mckusick D.O.   On: 05/14/2016 13:02        Scheduled Meds: . amLODipine  5 mg Oral QHS  . azithromycin  500 mg Oral Q24H  . carvedilol  12.5 mg Oral BID WC  . cefTRIAXone (ROCEPHIN)  IV  1 g Intravenous Q24H  . insulin aspart  0-5 Units Subcutaneous QHS  . insulin aspart  0-9 Units Subcutaneous TID WC  . insulin aspart  3 Units Subcutaneous TID WC  . insulin detemir  5 Units Subcutaneous QHS  . pregabalin  25 mg Oral BID  . rosuvastatin  40 mg Oral Daily   Continuous Infusions:    LOS: 2 days     Cordelia Poche [facility] 05/15/2016, 2:22 PM Pager: [phone]  If 7PM-7AM, please contact night-coverage [email or web address] Password [email or web address] 05/15/2016, 2:22 PM

## 2016-05-15 NOTE — Progress Notes (Signed)
PT Cancellation Note  Patient Details Name: [name] [name] MRN: [id number] DOB: [birth year]   Cancelled Treatment:    Reason Eval/Treat Not Completed: Fatigue/lethargy limiting ability to participate   Claretha Cooper 05/15/2016, 5:08 PM

## 2016-05-16 ENCOUNTER — Inpatient Hospital Stay (HOSPITAL_COMMUNITY): Payer: PPO

## 2016-05-16 ENCOUNTER — Other Ambulatory Visit: Payer: Self-pay | Admitting: Radiology

## 2016-05-16 DIAGNOSIS — J948 Other specified pleural conditions: Secondary | ICD-10-CM

## 2016-05-16 LAB — GLUCOSE, CAPILLARY
Glucose-Capillary: 148 mg/dL — ABNORMAL HIGH (ref 65–99)
Glucose-Capillary: 192 mg/dL — ABNORMAL HIGH (ref 65–99)
Glucose-Capillary: 167 mg/dL — ABNORMAL HIGH (ref 65–99)
Glucose-Capillary: 211 mg/dL — ABNORMAL HIGH (ref 65–99)
Glucose-Capillary: 200 mg/dL — ABNORMAL HIGH (ref 65–99)

## 2016-05-16 LAB — BASIC METABOLIC PANEL
Anion gap: 8 (ref 5–15)
BUN: 23 mg/dL — AB (ref 6–20)
CHLORIDE: 100 mmol/L — AB (ref 101–111)
CO2: 29 mmol/L (ref 22–32)
CREATININE: 2.17 mg/dL — AB (ref 0.61–1.24)
Calcium: 9.1 mg/dL (ref 8.9–10.3)
GFR calc non Af Amer: 27 mL/min — ABNORMAL LOW (ref >60)
GFR, EST AFRICAN AMERICAN: 32 mL/min — AB (ref >60)
Glucose, Bld: 152 mg/dL — ABNORMAL HIGH (ref 65–99)
Potassium: 4.4 mmol/L (ref 3.5–5.1)
Sodium: 137 mmol/L (ref 135–145)

## 2016-05-16 MED ORDER — SODIUM CHLORIDE 0.9 % IV BOLUS (SEPSIS)
250.0000 mL | Freq: Once | INTRAVENOUS | Status: AC
  Administered 2016-05-16: 250 mL via INTRAVENOUS

## 2016-05-16 MED ORDER — POLYETHYLENE GLYCOL 3350 17 G PO PACK
17.0000 g | PACK | ORAL | Status: DC
  Administered 2016-05-16 – 2016-05-19 (×11): 17 g via ORAL
  Filled 2016-05-16 (×11): qty 1

## 2016-05-16 MED ORDER — INSULIN ASPART 100 UNIT/ML ~~LOC~~ SOLN
5.0000 [IU] | Freq: Three times a day (TID) | SUBCUTANEOUS | Status: DC
Start: 2016-05-16 — End: 2016-05-23
  Administered 2016-05-18 – 2016-05-23 (×9): 5 [IU] via SUBCUTANEOUS
  Administered 2016-05-23: 2 [IU] via SUBCUTANEOUS
  Administered 2016-05-23: 5 [IU] via SUBCUTANEOUS

## 2016-05-16 MED ORDER — POLYETHYLENE GLYCOL (MIRALAX) NICU SYRINGE 0.34GM/ML: 1.0000 g/kg | ORAL | Status: DC

## 2016-05-16 MED ORDER — ACETAMINOPHEN 500 MG PO TABS: 1000.0000 mg | ORAL_TABLET | Freq: Every day | ORAL | Status: DC

## 2016-05-16 MED ORDER — ACETAMINOPHEN 500 MG PO TABS
1000.0000 mg | ORAL_TABLET | Freq: Every day | ORAL | Status: DC
  Administered 2016-05-16 – 2016-05-23 (×8): 1000 mg via ORAL
  Filled 2016-05-16 (×8): qty 2

## 2016-05-16 NOTE — Progress Notes (Signed)
[facility] Progress Note  Subjective: Patient feels okay, no stool since enemas 2 days ago. Hemoccult still pending. Relieved that thoracentesis fluid did not reveal malignancy  Objective: Vital signs in last 24 hours: Temp:  [98.4 F (36.9 [name])-101.8 F (38.8 [name])] 98.4 F (36.9 [name]) (02/09 0821) Pulse Rate:  [78-98] 94 (02/09 0821) Resp:  [16-18] 16 (02/09 0447) BP: (104-135)/(44-65) 130/51 (02/09 0821) SpO2:  [97 %-100 %] 100 % (02/09 0821) Weight change:    PE: Unchanged  Lab Results: Results for orders placed or performed during the hospital encounter of 05/13/16 (from the past 24 hour(s))  Basic metabolic panel     Status: Abnormal   Collection Time: 05/15/16 11:35 AM  Result Value Ref Range   Sodium 135 135 - 145 mmol/L   Potassium 4.6 3.5 - 5.1 mmol/L   Chloride 100 (L) 101 - 111 mmol/L   CO2 28 22 - 32 mmol/L   Glucose, Bld 272 (H) 65 - 99 mg/dL   BUN 26 (H) 6 - 20 mg/dL   Creatinine, Ser 2.32 (H) 0.61 - 1.24 mg/dL   Calcium 8.6 (L) 8.9 - 10.3 mg/dL   GFR calc non Af Amer 25 (L) >60 mL/min   GFR calc Af Amer 29 (L) >60 mL/min   Anion gap 7 5 - 15  CBC     Status: Abnormal   Collection Time: 05/15/16 11:35 AM  Result Value Ref Range   WBC 8.7 4.0 - 10.5 K/uL   RBC 3.33 (L) 4.22 - 5.81 MIL/uL   Hemoglobin 9.3 (L) 13.0 - 17.0 g/dL   HCT 30.1 (L) 39.0 - 52.0 %   MCV 90.4 78.0 - 100.0 fL   MCH 27.9 26.0 - 34.0 pg   MCHC 30.9 30.0 - 36.0 g/dL   RDW 15.4 11.5 - 15.5 %   Platelets 249 150 - 400 K/uL  Glucose, capillary     Status: Abnormal   Collection Time: 05/15/16 12:34 PM  Result Value Ref Range   Glucose-Capillary 224 (H) 65 - 99 mg/dL   Comment 1 Notify RN    Comment 2 Document in Chart   Glucose, capillary     Status: Abnormal   Collection Time: 05/15/16  6:06 PM  Result Value Ref Range   Glucose-Capillary 270 (H) 65 - 99 mg/dL   Comment 1 Notify RN    Comment 2 Document in Chart   Glucose, capillary     Status: Abnormal   Collection Time:  05/15/16  9:19 PM  Result Value Ref Range   Glucose-Capillary 273 (H) 65 - 99 mg/dL  Basic metabolic panel     Status: Abnormal   Collection Time: 05/16/16  6:52 AM  Result Value Ref Range   Sodium 137 135 - 145 mmol/L   Potassium 4.4 3.5 - 5.1 mmol/L   Chloride 100 (L) 101 - 111 mmol/L   CO2 29 22 - 32 mmol/L   Glucose, Bld 152 (H) 65 - 99 mg/dL   BUN 23 (H) 6 - 20 mg/dL   Creatinine, Ser 2.17 (H) 0.61 - 1.24 mg/dL   Calcium 9.1 8.9 - 10.3 mg/dL   GFR calc non Af Amer 27 (L) >60 mL/min   GFR calc Af Amer 32 (L) >60 mL/min   Anion gap 8 5 - 15  Glucose, capillary     Status: Abnormal   Collection Time: 05/16/16  8:40 AM  Result Value Ref Range   Glucose-Capillary 148 (H) 65 - 99 mg/dL   Comment  1 Notify RN    Comment 2 Document in Chart     Studies/Results: Dg Chest 1 View  Result Date: 05/14/2016 CLINICAL DATA:  Right thoracentesis EXAM: CHEST 1 VIEW COMPARISON:  CT chest of 05/13/2016 and chest x-ray of 05/13/2016 FINDINGS: After right thoracentesis, the volume of right pleural effusion has diminished. There is a tiny right apical pneumothorax present of less than 5%. The left lung is clear. Cardiomegaly is stable and AICD leads remain. IMPRESSION: 1. Tiny right apical pneumothorax after right thoracentesis. 2. Decrease in volume of right pleural effusion. Electronically Signed   By: Ivar Drape M.D.   On: 05/14/2016 11:47   Dg Chest Port 1 View  Result Date: 05/15/2016 CLINICAL DATA:  Right-sided pleural effusion EXAM: PORTABLE CHEST 1 VIEW COMPARISON:  05/14/2016 FINDINGS: Cardiac shadow is enlarged. A defibrillator is again noted and stable. Postsurgical changes are again seen. Left lung remains clear. The tiny apical pneumothorax is not well appreciated on this exam although some apical capping is noted and this may represent redistribution of residual fluid. New right perihilar and basilar infiltrate is noted. This may represent some re-expansion edema or progressive infiltrate.  IMPRESSION: Increased infiltrative density in the right lung. This may represent some re-expansion edema. Small residual effusion is noted. Electronically Signed   By: Inez Catalina M.D.   On: 05/15/2016 07:17   US Thoracentesis Asp Pleural Space W/img Guide  Result Date: 05/14/2016 INDICATION: Patient with history of coronary artery disease, prior CABG, weight loss, chronic kidney disease, CHF, prior smoker, large right pleural effusion. Request made for diagnostic and therapeutic right thoracentesis. EXAM: ULTRASOUND GUIDED DIAGNOSTIC AND THERAPEUTIC RIGHT THORACENTESIS MEDICATIONS: None. COMPLICATIONS: None immediate. PROCEDURE: An ultrasound guided thoracentesis was thoroughly discussed with the patient and questions answered. The benefits, risks, alternatives and complications were also discussed. The patient understands and wishes to proceed with the procedure. Written consent was obtained. Ultrasound was performed to localize and mark an adequate pocket of fluid in the right chest. The area was then prepped and draped in the normal sterile fashion. 1% Lidocaine was used for local anesthesia. Under ultrasound guidance a Safe-T-Centesis catheter was introduced. Thoracentesis was performed. The catheter was removed and a dressing applied. FINDINGS: A total of approximately 2 liters of turbid, amber fluid was removed. Samples were sent to the laboratory as requested by the clinical team. IMPRESSION: Successful ultrasound guided diagnostic and therapeutic right thoracentesis yielding 2 liters of pleural fluid. Read by: Rowe Robert, PA-[name] Electronically Signed   By: Corrie Mckusick D.O.   On: 05/14/2016 13:02      Assessment: Fecal impaction Pneumonia with pleural effusion Dark stool with anemia, no recent colonoscopy Hemoccults pending  Plan: MiraLAX to stimulate bowel movement Repeat KUB today to assess stool burden After discussion with wife tended plan is to allow solid food tomorrow if fecal  impaction appears to be resolved Then clear liquid diet Sunday  in preparation for colonoscopy on Monday. [facility] will follow up.    [name] [name] 05/16/2016, 10:17 AM  Pager [phone] If no answer or after 5 PM call [phone]

## 2016-05-16 NOTE — Progress Notes (Signed)
PROGRESS NOTE    [name] [name]  PPJ:[id number] DOB: April 10, 1934 DOA: 05/13/2016 PCP: Elsie Stain, MD   Brief Narrative: [name] [name] is a 82 y.o. male with medical history significant of past medical history of coronary artery disease (ischemic cardiomyopathy) status post CABG with a last EF in 2017 that showed an EF of 35%, and a kidney disease stage III, insulin-dependent diabetes mellitus anemia of chronic diseases   Assessment & Plan:   Active Problems:   Chronic systolic CHF (congestive heart failure) (HCC)   Stage 3 chronic renal impairment associated with type 2 diabetes mellitus (HCC)   Anemia, iron deficiency   Pleural effusion on right   CAP (community acquired pneumonia)   Microcytic anemia   Melena   Fecal impaction (HCC)   Acute respiratory failure with hypoxia (HCC)   AKI (acute kidney injury) ([place])   CKD (chronic kidney disease), stage III   Acute respiratory failure Community acquired pneumonia Right pleural effusion S/p thoracentesis yesterday with 2L removed. Fluid appears exudative. No bacteria. No cancer seen on cytology. -continue Ceftriaxone and azithromycin for now -culture pending -repeat thoracentesis -repeat chest x-ray: small-moderate effusion  Right hydropneumothorax Small pneumothorax. -thoracentesis as above  Fecal impaction Constipation Resolved after enema. Moderate stool burden on KUB -Miralax  AKI on CKD stage III Improved back to around baseline. Baseline around 1.8 to 2.0  Microcytic anemia Melena No history of colonoscopy. CT scan not significant for obvious masses. GI consulted -GI recommendations  Diabetes mellitus type 2 -continue Lantus -continue SSI  S/p CABG  Chronic systolic CHF Euvolemic. AICD in place. -continue to hold aldactone and Demadex in setting of AKI  Urinary retension Possibly secondary to fecal impaction. Foley in place. -remove foley and attempt voiding trial   DVT prophylaxis: SCDs Code  Status: Full code Family Communication: None at bedside Disposition Plan: Discharge in 3 days   Consultants:   Gastroenterology  Procedures:   None  Antimicrobials:   Ceftriaxone  Azithromycin   Subjective: No dyspnea or chest pain. No bowel movement. No abdominal pain. Fever overnight with chills.  Objective: Vitals:   05/15/16 2054 05/16/16 0032 05/16/16 0447 05/16/16 0821  BP: 135/65  (!) 114/59 (!) 130/51  Pulse: 78  95 94  Resp: 18  16   Temp: 98.8 F (37.1 C) (!) 101.8 F (38.8 C) 100 F (37.8 C) 98.4 F (36.9 C)  TempSrc: Oral Oral Oral Oral  SpO2: 99%  97% 100%  Weight:      Height:        Intake/Output Summary (Last 24 hours) at 05/16/16 1158 Last data filed at 05/16/16 1122  Gross per 24 hour  Intake              240 ml  Output             2250 ml  Net            -2010 ml   Filed Weights   05/13/16 1908  Weight: 108.7 kg (239 lb 11.2 oz)    Examination:  General exam: Appears calm and comfortable  Respiratory system: Decreased breath sounds in RLL. Takes multiple breaths during sentences (baseline per wife) Cardiovascular system: S1 & S2 heard, Normal rate with regular rhythm. Gastrointestinal system: Abdomen is nondistended, soft and nontender. Normal bowel sounds heard. Central nervous system: Alert and oriented. No focal neurological deficits. Extremities: No edema. No calf tenderness Skin: No cyanosis. No rashes Psychiatry: Judgement and insight appear normal. Mood & affect appropriate.  Data Reviewed: I have personally reviewed following labs and imaging studies  CBC:  Recent Labs Lab 05/13/16 1159 05/15/16 1135  WBC 12.9* 8.7  NEUTROABS 11.1*  --   HGB 9.5* 9.3*  HCT 30.0* 30.1*  MCV 88.2 90.4  PLT 268 413   Basic Metabolic Panel:  Recent Labs Lab 05/13/16 1159 05/13/16 2040 05/14/16 0433 05/15/16 1135 05/16/16 0652  NA 138  --  140 135 137  K 4.3  --  4.2 4.6 4.4  CL 101  --  104 100* 100*  CO2 28  --  32 28  29  GLUCOSE 176*  --  120* 272* 152*  BUN 34*  --  29* 26* 23*  CREATININE 2.17*  --  1.94* 2.32* 2.17*  CALCIUM 9.8  --  9.3 8.6* 9.1  MG  --  2.1  --   --   --    GFR: Estimated Creatinine Clearance: 32.4 mL/min (by C-G formula based on SCr of 2.17 mg/dL (H)). Liver Function Tests:  Recent Labs Lab 05/13/16 1800 05/14/16 0433  AST 14* 13*  ALT 10* 12*  ALKPHOS 44 42  BILITOT 0.5 0.4  PROT 7.1 6.6  ALBUMIN 3.2* 3.1*   BNP (last 3 results)  Recent Labs  04/08/16 1529  PROBNP 511.0*   HbA1C:  Recent Labs  05/13/16 2040  HGBA1C 6.9*   CBG:  Recent Labs Lab 05/15/16 0739 05/15/16 1234 05/15/16 1806 05/15/16 2119 05/16/16 0840  GLUCAP 169* 224* 270* 273* 148*   Lipid Profile: No results for input(s): CHOL, HDL, LDLCALC, TRIG, CHOLHDL, LDLDIRECT in the last 72 hours. Thyroid Function Tests: No results for input(s): TSH, T4TOTAL, FREET4, T3FREE, THYROIDAB in the last 72 hours. Anemia Panel:  Recent Labs  05/13/16 2040  VITAMINB12 328  FOLATE 14.8  FERRITIN 366*  TIBC 234*  IRON 15*  RETICCTPCT 1.5   Sepsis Labs: No results for input(s): PROCALCITON, LATICACIDVEN in the last 168 hours.  Recent Results (from the past 240 hour(s))  Urine culture     Status: None   Collection Time: 05/13/16  1:16 PM  Result Value Ref Range Status   Specimen Description URINE, CLEAN CATCH  Final   Special Requests NONE  Final   Culture   Final    NO GROWTH Performed at [facility], [address] [place], [place] [address]    Report Status 05/15/2016 FINAL  Final  Blood culture (routine x 2)     Status: None (Preliminary result)   Collection Time: 05/13/16  6:00 PM  Result Value Ref Range Status   Specimen Description BLOOD LEFT HAND  Final   Special Requests BOTTLES DRAWN AEROBIC AND ANAEROBIC 5CC  Final   Culture   Final    NO GROWTH 2 DAYS Performed at [facility], [place] [address] [place], [place] [address]    Report Status PENDING   Incomplete  Blood culture (routine x 2)     Status: None (Preliminary result)   Collection Time: 05/13/16  6:20 PM  Result Value Ref Range Status   Specimen Description BLOOD RIGHT HAND  Final   Special Requests IN PEDIATRIC BOTTLE 2CC  Final   Culture   Final    NO GROWTH 2 DAYS Performed at [facility], [place] [address], [place], [place] [address]    Report Status PENDING  Incomplete  Culture, body fluid-bottle     Status: None (Preliminary result)   Collection Time: 05/14/16 11:29 AM  Result Value Ref Range Status   Specimen Description PLEURAL RIGHT  Final   Special Requests NONE  Final   Culture   Final    NO GROWTH < 24 HOURS Performed at [facility], [address] [place], [place] [address]    Report Status PENDING  Incomplete  Gram stain     Status: None   Collection Time: 05/14/16 11:29 AM  Result Value Ref Range Status   Specimen Description PLEURAL RIGHT  Final   Special Requests NONE  Final   Gram Stain   Final    RARE WBC PRESENT,BOTH PMN AND MONONUCLEAR NO ORGANISMS SEEN Performed at [facility], [place] [address], [place], [place] [address]    Report Status 05/14/2016 FINAL  Final         Radiology Studies: Dg Chest Port 1 View  Result Date: 05/16/2016 CLINICAL DATA:  Followup right pleural effusion. Chronic systolic congestive heart failure. Chronic kidney disease. EXAM: PORTABLE CHEST 1 VIEW COMPARISON:  05/15/2016 FINDINGS: Stable cardiomegaly. AICD remains in appropriate position. Prior CABG. Mild improvement in right mid and lower lung airspace opacity since prior study. Small to moderate right pleural effusion again noted. Left lung remains clear. IMPRESSION: Mild improvement in right mid and lower lung airspace opacity. Small to moderate right pleural effusion. Stable cardiomegaly. Electronically Signed   By: Earle Gell M.D.   On: 05/16/2016 11:09   Dg Chest Port 1 View  Result Date: 05/15/2016 CLINICAL DATA:  Right-sided pleural  effusion EXAM: PORTABLE CHEST 1 VIEW COMPARISON:  05/14/2016 FINDINGS: Cardiac shadow is enlarged. A defibrillator is again noted and stable. Postsurgical changes are again seen. Left lung remains clear. The tiny apical pneumothorax is not well appreciated on this exam although some apical capping is noted and this may represent redistribution of residual fluid. New right perihilar and basilar infiltrate is noted. This may represent some re-expansion edema or progressive infiltrate. IMPRESSION: Increased infiltrative density in the right lung. This may represent some re-expansion edema. Small residual effusion is noted. Electronically Signed   By: Inez Catalina M.D.   On: 05/15/2016 07:17   Dg Abd 2 Views  Result Date: 05/16/2016 CLINICAL DATA:  Fecal impaction. Recent right thoracentesis for pleural effusion. EXAM: ABDOMEN - 2 VIEW COMPARISON:  CT on 05/13/2016 FINDINGS: No evidence of dilated bowel loops. Small to moderate stool seen throughout colon. The no evidence of free intraperitoneal air. Right pleural effusion with small pneumothorax component seen, consistent with recent thoracentesis. IMPRESSION: Unremarkable bowel gas pattern.  Small to moderate stool burden. Small right hydro-pneumothorax, status post recent right thoracentesis. Electronically Signed   By: Earle Gell M.D.   On: 05/16/2016 11:19        Scheduled Meds: . [START ON 05/17/2016] acetaminophen  1,000 mg Oral Daily  . amLODipine  5 mg Oral QHS  . azithromycin  500 mg Oral Q24H  . carvedilol  12.5 mg Oral BID WC  . cefTRIAXone (ROCEPHIN)  IV  1 g Intravenous Q24H  . insulin aspart  0-5 Units Subcutaneous QHS  . insulin aspart  0-9 Units Subcutaneous TID WC  . insulin aspart  5 Units Subcutaneous TID WC  . insulin detemir  5 Units Subcutaneous QHS  . polyethylene glycol  17 g Oral Q4H while awake  . pregabalin  25 mg Oral BID  . rosuvastatin  40 mg Oral Daily   Continuous Infusions:    LOS: 3 days     Cordelia Poche [facility] 05/16/2016, 11:58 AM Pager: [phone]  If 7PM-7AM, please contact night-coverage [email or web address] Password [email or web address] 05/16/2016, 11:58 AM

## 2016-05-16 NOTE — Progress Notes (Signed)
PT Cancellation Note  Patient Details Name: [name] [name] MRN: [id number] DOB: 12-21-1934   Cancelled Treatment:    Reason Eval/Treat Not Completed: Patient at procedure or test/unavailable. Will check back another time/day.    Weston Anna, MPT Pager: [phone]

## 2016-05-16 NOTE — Progress Notes (Signed)
Foley catheter discontinued at 1122, tolerated the procedure. No urine at this time. Patient sleeping. S/P thoracentesis. Vital signs taken by NT. Will endorsed to oncoming nurse.

## 2016-05-16 NOTE — Procedures (Signed)
Ultrasound-guided diagnostic and therapeutic right thoracentesis performed yielding 1.8 liters of hazy, amber colored fluid. No immediate complications. Follow-up chest x-ray pending. A portion of the fluid was sent to the lab for preordered studies.        

## 2016-05-16 NOTE — Progress Notes (Signed)
Inpatient Diabetes Program Recommendations  AACE/ADA: New Consensus Statement on Inpatient Glycemic Control (2015)  Target Ranges:  Prepandial:   less than 140 mg/dL      Peak postprandial:   less than 180 mg/dL (1-2 hours)      Critically ill patients:  140 - 180 mg/dL   Results for LENELL, LAMA (MRN [id number]) as of 05/16/2016 09:23  Ref. Range 05/15/2016 07:39 05/15/2016 12:34 05/15/2016 18:06 05/15/2016 21:19  Glucose-Capillary Latest Ref Range: 65 - 99 mg/dL 169 (H) 224 (H) 270 (H) 273 (H)    Admit with: Pneumonia  History: DM, CKD, COPD  Home DM Meds: VGO-20 Disposable Insulin Pump       Victoza 1.2 mg daily  Current Insulin Orders: Levemir 5 units QHS      Novolog Sensitive Correction Scale/ SSI (0-9 units) TID AC + HS      Novolog 3 units TID with meals      MD- Per records, patient uses VGO-20 Disposable insulin pump at home.  This disposable pump delivers 20 units basal insulin over the course of the day.  Patient also boluses himself for meals with the pump.  Per Chart review, pt saw his Endocrinologist Dr. Dwyane Dee [facility]) on 01/24/16.  At that visit, pt was instructed to take "5 clicks" per meal which be the equivalent of 10 units Novolog with meals (each "click" delivers 2 units).  Please consider the following in -hospital insulin adjustments:  1. Increase Levemir slightly to 10 units QHS (50% home dose)  2. Increase Novolog Meal Coverage to: Novolog 5 units TID with meals (hold if pt eats <50% of meal)  This would be 50% total home dose of meal coverage      --Will follow patient during hospitalization--  Wyn Quaker RN, MSN, CDE Diabetes Coordinator Inpatient Glycemic Control Team Team Pager: [phone] (8a-5p)

## 2016-05-17 ENCOUNTER — Inpatient Hospital Stay (HOSPITAL_COMMUNITY): Payer: PPO

## 2016-05-17 DIAGNOSIS — R400 Somnolence: Secondary | ICD-10-CM

## 2016-05-17 LAB — GLUCOSE, CAPILLARY
GLUCOSE-CAPILLARY: 212 mg/dL — AB (ref 65–99)
GLUCOSE-CAPILLARY: 252 mg/dL — AB (ref 65–99)
GLUCOSE-CAPILLARY: 225 mg/dL — AB (ref 65–99)
GLUCOSE-CAPILLARY: 186 mg/dL — AB (ref 65–99)

## 2016-05-17 LAB — CBC
HEMATOCRIT: 30.3 % — AB (ref 39.0–52.0)
HEMOGLOBIN: 9.3 g/dL — AB (ref 13.0–17.0)
MCH: 27.5 pg (ref 26.0–34.0)
MCHC: 30.7 g/dL (ref 30.0–36.0)
MCV: 89.6 fL (ref 78.0–100.0)
Platelets: 254 10*3/uL (ref 150–400)
RBC: 3.38 MIL/uL — AB (ref 4.22–5.81)
RDW: 14.6 % (ref 11.5–15.5)
WBC: 8.1 10*3/uL (ref 4.0–10.5)

## 2016-05-17 LAB — BLOOD GAS, ARTERIAL
Acid-Base Excess: 5.4 mmol/L — ABNORMAL HIGH (ref 0.0–2.0)
Bicarbonate: 30.6 mmol/L — ABNORMAL HIGH (ref 20.0–28.0)
Drawn by: 441261
O2 CONTENT: 1.5 L/min
O2 SAT: 96.2 %
PATIENT TEMPERATURE: 98.6
pCO2 arterial: 51.5 mmHg — ABNORMAL HIGH (ref 32.0–48.0)
pH, Arterial: 7.392 (ref 7.350–7.450)
pO2, Arterial: 79.8 mmHg — ABNORMAL LOW (ref 83.0–108.0)

## 2016-05-17 LAB — BASIC METABOLIC PANEL
ANION GAP: 7 (ref 5–15)
BUN: 23 mg/dL — ABNORMAL HIGH (ref 6–20)
CO2: 30 mmol/L (ref 22–32)
CREATININE: 2.39 mg/dL — AB (ref 0.61–1.24)
Calcium: 9.1 mg/dL (ref 8.9–10.3)
Chloride: 101 mmol/L (ref 101–111)
GFR calc non Af Amer: 24 mL/min — ABNORMAL LOW (ref >60)
GFR, EST AFRICAN AMERICAN: 28 mL/min — AB (ref >60)
Glucose, Bld: 141 mg/dL — ABNORMAL HIGH (ref 65–99)
Potassium: 4.5 mmol/L (ref 3.5–5.1)
SODIUM: 138 mmol/L (ref 135–145)

## 2016-05-17 LAB — AMMONIA: Ammonia: 26 umol/L (ref 9–35)

## 2016-05-17 NOTE — Progress Notes (Signed)
PROGRESS NOTE    Nestor Wieneke  TKW:[id number] DOB: 1934/05/15 DOA: 05/13/2016 PCP: Elsie Stain, MD   Brief Narrative: Dana Dorner is a 82 y.o. male with medical history significant of past medical history of coronary artery disease (ischemic cardiomyopathy) status post CABG with a last EF in 2017 that showed an EF of 35%, and a kidney disease stage III, insulin-dependent diabetes mellitus anemia of chronic diseases   Assessment & Plan:   Active Problems:   Chronic systolic CHF (congestive heart failure) (HCC)   Stage 3 chronic renal impairment associated with type 2 diabetes mellitus (HCC)   Anemia, iron deficiency   Pleural effusion on right   CAP (community acquired pneumonia)   Microcytic anemia   Melena   Fecal impaction (HCC)   Acute respiratory failure with hypoxia (HCC)   AKI (acute kidney injury) ([place])   CKD (chronic kidney disease), stage III   Acute respiratory failure Community acquired pneumonia Right pleural effusion S/p thoracentesis with 2L removed. Fluid appears exudative. No bacteria. No cancer seen on cytology. Repeat thoracentesis with 1.8L removed. Repeat chest x-ray significant for small amount of effusion. No pneumothorax. -continue Ceftriaxone and azithromycin -culture pending  Somnolence Likely secondary to Lyrica in setting of CKD. Lyrica is renally dosed, already but will discontinue and watch mental status. Will get ammonia. ABG largely unremarkable minus hypoxemia -discontinue Lyrica  Right hydropneumothorax Resolved.  Fecal impaction Constipation Resolved after enema. Moderate stool burden on KUB -Miralax  AKI on CKD stage III Improved back to around baseline. Baseline around 1.8 to 2.0  Microcytic anemia Melena No history of colonoscopy. CT scan not significant for obvious masses. GI consulted -GI recommendations  Diabetes mellitus type 2 -continue Lantus -continue SSI  S/p CABG  Chronic systolic CHF Euvolemic. AICD in  place. -continue to hold aldactone and Demadex in setting of AKI  Urinary retension Possibly secondary to fecal impaction. Repeat bladder scan with foley removed significant for retention. -reinsert foley   DVT prophylaxis: SCDs Code Status: Full code Family Communication: None at bedside Disposition Plan: Discharge in 2-3 days   Consultants:   Gastroenterology  Procedures:   None  Antimicrobials:   Ceftriaxone  Azithromycin   Subjective: Tiny bowel movement overnight. No abdominal pain. No chest pain or dyspnea.  Objective: Vitals:   05/17/16 0841 05/17/16 0944 05/17/16 1005 05/17/16 1434  BP: 109/62   (!) 114/54  Pulse: 88   71  Resp:    18  Temp:      TempSrc:      SpO2: 97% 92% (!) 87% 98%  Weight:      Height:        Intake/Output Summary (Last 24 hours) at 05/17/16 1517 Last data filed at 05/17/16 9242  Gross per 24 hour  Intake              720 ml  Output              400 ml  Net              320 ml   Filed Weights   05/13/16 1908  Weight: 108.7 kg (239 lb 11.2 oz)    Examination:  General exam: Appears calm and comfortable. Somnolent Respiratory system: Decreased breath sounds in RLL. Cardiovascular system: S1 & S2 heard, Normal rate with regular rhythm. Gastrointestinal system: Abdomen is nondistended, soft and nontender. Normal bowel sounds heard. Central nervous system: somnolent and oriented. No focal neurological deficits. Extremities: No edema. No calf tenderness Skin:  No cyanosis. No rashes Psychiatry: Judgement and insight appear normal. Mood & affect appropriate.    Data Reviewed: I have personally reviewed following labs and imaging studies  CBC:  Recent Labs Lab 05/13/16 1159 05/15/16 1135 05/17/16 1031  WBC 12.9* 8.7 8.1  NEUTROABS 11.1*  --   --   HGB 9.5* 9.3* 9.3*  HCT 30.0* 30.1* 30.3*  MCV 88.2 90.4 89.6  PLT 268 249 016   Basic Metabolic Panel:  Recent Labs Lab 05/13/16 1159 05/13/16 2040  05/14/16 0433 05/15/16 1135 05/16/16 0652 05/17/16 0412  NA 138  --  140 135 137 138  K 4.3  --  4.2 4.6 4.4 4.5  CL 101  --  104 100* 100* 101  CO2 28  --  32 '28 29 30  '$ GLUCOSE 176*  --  120* 272* 152* 141*  BUN 34*  --  29* 26* 23* 23*  CREATININE 2.17*  --  1.94* 2.32* 2.17* 2.39*  CALCIUM 9.8  --  9.3 8.6* 9.1 9.1  MG  --  2.1  --   --   --   --    GFR: Estimated Creatinine Clearance: 29.5 mL/min (by C-G formula based on SCr of 2.39 mg/dL (H)). Liver Function Tests:  Recent Labs Lab 05/13/16 1800 05/14/16 0433  AST 14* 13*  ALT 10* 12*  ALKPHOS 44 42  BILITOT 0.5 0.4  PROT 7.1 6.6  ALBUMIN 3.2* 3.1*   BNP (last 3 results)  Recent Labs  04/08/16 1529  PROBNP 511.0*   HbA1C: No results for input(s): HGBA1C in the last 72 hours. CBG:  Recent Labs Lab 05/16/16 1720 05/16/16 2034 05/16/16 2208 05/17/16 0729 05/17/16 1131  GLUCAP 167* 211* 200* 212* 252*   Lipid Profile: No results for input(s): CHOL, HDL, LDLCALC, TRIG, CHOLHDL, LDLDIRECT in the last 72 hours. Thyroid Function Tests: No results for input(s): TSH, T4TOTAL, FREET4, T3FREE, THYROIDAB in the last 72 hours. Anemia Panel: No results for input(s): VITAMINB12, FOLATE, FERRITIN, TIBC, IRON, RETICCTPCT in the last 72 hours. Sepsis Labs: No results for input(s): PROCALCITON, LATICACIDVEN in the last 168 hours.  Recent Results (from the past 240 hour(s))  Urine culture     Status: None   Collection Time: 05/13/16  1:16 PM  Result Value Ref Range Status   Specimen Description URINE, CLEAN CATCH  Final   Special Requests NONE  Final   Culture   Final    NO GROWTH Performed at [facility], [address] [place], [place] [address]    Report Status 05/15/2016 FINAL  Final  Blood culture (routine x 2)     Status: None (Preliminary result)   Collection Time: 05/13/16  6:00 PM  Result Value Ref Range Status   Specimen Description BLOOD LEFT HAND  Final   Special Requests BOTTLES DRAWN  AEROBIC AND ANAEROBIC 5CC  Final   Culture   Final    NO GROWTH 4 DAYS Performed at [facility], [place] [address], [place], [place] [address]    Report Status PENDING  Incomplete  Blood culture (routine x 2)     Status: None (Preliminary result)   Collection Time: 05/13/16  6:20 PM  Result Value Ref Range Status   Specimen Description BLOOD RIGHT HAND  Final   Special Requests IN PEDIATRIC BOTTLE 2CC  Final   Culture   Final    NO GROWTH 4 DAYS Performed at [facility], [place] [address], [place], [place] [address]  Report Status PENDING  Incomplete  Culture, body fluid-bottle     Status: None (Preliminary result)   Collection Time: 05/14/16 11:29 AM  Result Value Ref Range Status   Specimen Description PLEURAL RIGHT  Final   Special Requests NONE  Final   Culture   Final    NO GROWTH 3 DAYS Performed at [facility], [address] [place], [place] [address]    Report Status PENDING  Incomplete  Gram stain     Status: None   Collection Time: 05/14/16 11:29 AM  Result Value Ref Range Status   Specimen Description PLEURAL RIGHT  Final   Special Requests NONE  Final   Gram Stain   Final    RARE WBC PRESENT,BOTH PMN AND MONONUCLEAR NO ORGANISMS SEEN Performed at [facility], [place] [address] [place], [place] [address]    Report Status 05/14/2016 FINAL  Final  Body fluid culture     Status: None (Preliminary result)   Collection Time: 05/16/16  1:30 PM  Result Value Ref Range Status   Specimen Description Pleural R  Final   Special Requests NONE  Final   Gram Stain   Final    RARE WBC PRESENT, PREDOMINANTLY MONONUCLEAR NO ORGANISMS SEEN    Culture   Final    NO GROWTH < 24 HOURS Performed at [facility], [place] [address] [place], [place] [address]    Report Status PENDING  Incomplete         Radiology Studies: Dg Chest 1 View  Result Date: 05/16/2016 CLINICAL DATA:  Status post right-sided thoracentesis. EXAM: CHEST 1 VIEW  COMPARISON:  Study obtained earlier in the day. FINDINGS: Most of the right pleural effusion noted previously has been removed. No pneumothorax. A small amount of pleural effusion remains on the right. There is consolidation in the right base medially. Left lung is clear. There is apparent eventration of the right hemidiaphragm. Heart is mildly enlarged with pulmonary vascularity within normal limits. Pacemaker leads are attached to the right atrium, right ventricle, and coronary sinus. No adenopathy. There is atherosclerotic calcification in the aorta. No bone lesions. IMPRESSION: No pneumothorax post thoracentesis. Small amount of residual right pleural effusion as well as right base consolidation. Eventration right hemidiaphragm. Left lung clear. Stable cardiac prominence. There is aortic atherosclerosis Electronically Signed   By: Lowella Grip III M.D.   On: 05/16/2016 14:17   Dg Chest Port 1 View  Result Date: 05/17/2016 CLINICAL DATA:  Hypoxia and right sided pleural effusion. H/o CAD, ischemic cardiomyopathy, myocardial infarction, COPD, type 2 diabetes. Surgical h/o CABG in 2008 and cardiac defibrillator placement in 2013. Former smoker. EXAM: PORTABLE CHEST - 1 VIEW COMPARISON:  05/16/2016 FINDINGS: Stable left subclavian AICD. Previous median sternotomy and CABG. Low lung volumes with crowding of bibasilar bronchovascular structures. Heart size upper limits normal for technique.  Atheromatous aorta. Blunting of bilateral lateral costophrenic angles right worse than left ; can't exclude small pleural effusions. IMPRESSION: 1. Low volumes with bibasilar crowding, possible small effusions. Electronically Signed   By: Lucrezia Europe M.D.   On: 05/17/2016 12:44   Dg Chest Port 1 View  Result Date: 05/16/2016 CLINICAL DATA:  Followup right pleural effusion. Chronic systolic congestive heart failure. Chronic kidney disease. EXAM: PORTABLE CHEST 1 VIEW COMPARISON:  05/15/2016 FINDINGS: Stable cardiomegaly.  AICD remains in appropriate position. Prior CABG. Mild improvement in right mid and lower lung airspace opacity since prior study. Small to moderate right pleural effusion  again noted. Left lung remains clear. IMPRESSION: Mild improvement in right mid and lower lung airspace opacity. Small to moderate right pleural effusion. Stable cardiomegaly. Electronically Signed   By: Earle Gell M.D.   On: 05/16/2016 11:09   Dg Abd 2 Views  Result Date: 05/16/2016 CLINICAL DATA:  Fecal impaction. Recent right thoracentesis for pleural effusion. EXAM: ABDOMEN - 2 VIEW COMPARISON:  CT on 05/13/2016 FINDINGS: No evidence of dilated bowel loops. Small to moderate stool seen throughout colon. The no evidence of free intraperitoneal air. Right pleural effusion with small pneumothorax component seen, consistent with recent thoracentesis. IMPRESSION: Unremarkable bowel gas pattern.  Small to moderate stool burden. Small right hydro-pneumothorax, status post recent right thoracentesis. Electronically Signed   By: Earle Gell M.D.   On: 05/16/2016 11:19   US Thoracentesis Asp Pleural Space W/img Guide  Result Date: 05/16/2016 INDICATION: Coronary artery disease/prior CABG, chronic kidney disease, CHF, recurrent right pleural effusion. Request made for diagnostic and therapeutic right thoracentesis. EXAM: ULTRASOUND GUIDED DIAGNOSTIC AND THERAPEUTIC RIGHT THORACENTESIS MEDICATIONS: None. COMPLICATIONS: None immediate. PROCEDURE: An ultrasound guided thoracentesis was thoroughly discussed with the patient and questions answered. The benefits, risks, alternatives and complications were also discussed. The patient understands and wishes to proceed with the procedure. Written consent was obtained. Ultrasound was performed to localize and mark an adequate pocket of fluid in the right chest. The area was then prepped and draped in the normal sterile fashion. 1% Lidocaine was used for local anesthesia. Under ultrasound guidance a  Safe-T-Centesis catheter was introduced. Thoracentesis was performed. The catheter was removed and a dressing applied. FINDINGS: A total of approximately 1.8 liters of hazy, amber fluid was removed. Samples were sent to the laboratory as requested by the clinical team. IMPRESSION: Successful ultrasound guided diagnostic and therapeutic right thoracentesis yielding 1.8 liters of pleural fluid. Read by: Rowe Robert, PA-C Electronically Signed   By: Corrie Mckusick D.O.   On: 05/16/2016 14:02        Scheduled Meds: . acetaminophen  1,000 mg Oral Daily  . amLODipine  5 mg Oral QHS  . azithromycin  500 mg Oral Q24H  . carvedilol  12.5 mg Oral BID WC  . cefTRIAXone (ROCEPHIN)  IV  1 g Intravenous Q24H  . insulin aspart  0-5 Units Subcutaneous QHS  . insulin aspart  0-9 Units Subcutaneous TID WC  . insulin aspart  5 Units Subcutaneous TID WC  . insulin detemir  5 Units Subcutaneous QHS  . polyethylene glycol  17 g Oral Q4H while awake  . rosuvastatin  40 mg Oral Daily   Continuous Infusions:    LOS: 4 days     Cordelia Poche [facility] 05/17/2016, 3:17 PM Pager: [phone]  If 7PM-7AM, please contact night-coverage [email or web address] Password [email or web address] 05/17/2016, 3:17 PM

## 2016-05-17 NOTE — Significant Event (Addendum)
Rapid Response Event Note  Overview:      Initial Focused Assessment: Called at 11:00 to check on pt d/t increased lethargy. Pt lying supine in bed,in NAD, PT had just tried to work with him however he was too lethargic to participate.  Pt has eyes open but appears to stare into space.  He is able to answer questions appropriately, MAE, and denies pain. Wife is at bedside.    Interventions: Placed on monitor, shows SR, BP 95/60 HR 77 R 24, 02 sat was 94 on 2l, inc to 3L by bedside RN.  CBG was 212 earlier now 252.  Dr Teryl Lucy called and came to room.  Will get lab work and CXR.    Plan of Care (if not transferred):  Continue to monitor closely, and do continuous pulse ox monitoring.    Event Summary:BP 98/54 HR 77 sats 97% on 3l.  Pt continues to be lethargic but will arouse and is oriented.   at      at          [place], Jae Dire

## 2016-05-17 NOTE — Progress Notes (Signed)
Attempted to wean patient off of oxygen. Patient unable to maintain oxygen saturation greater than 92% on room air.  Oxygen level on room air 87%.  Oxygen @ 2 Liters  Applied via nasal canula oxygen level 97%.

## 2016-05-17 NOTE — Progress Notes (Signed)
[facility] Progress Note  Subjective: The patient is seen today on rounds. We were asked initially to see him because of heme positive stool and anemia which appears to be iron deficient. He was found to have an impaction, and this has improved. There were thoughts of doing a colonoscopy on him because he has never had one. He has a history of ischemic cardiomyopathy. He has had 2 thoracentesis since being in the hospital. He is short of breath. He is wearing oxygen.  Objective: Vital signs in last 24 hours: Temp:  [98 F (36.7 C)-100.6 F (38.1 C)] 98.4 F (36.9 C) (02/10 0547) Pulse Rate:  [83-97] 88 (02/10 0841) Resp:  [18] 18 (02/10 0547) BP: (109-141)/(50-90) 109/62 (02/10 0841) SpO2:  [97 %-98 %] 97 % (02/10 0841) Weight change:    PE:  Appears somewhat short of breath. He is wearing oxygen.  Heart regular rhythm  Lungs decreased breath sounds  Abdomen soft and nontender  Lab Results: Results for orders placed or performed during the hospital encounter of 05/13/16 (from the past 24 hour(s))  Glucose, capillary     Status: Abnormal   Collection Time: 05/16/16 12:09 PM  Result Value Ref Range   Glucose-Capillary 192 (H) 65 - 99 mg/dL   Comment 1 Notify RN    Comment 2 Document in Chart   Body fluid culture     Status: None (Preliminary result)   Collection Time: 05/16/16  1:30 PM  Result Value Ref Range   Specimen Description Pleural R    Special Requests NONE    Gram Stain      RARE WBC PRESENT, PREDOMINANTLY MONONUCLEAR NO ORGANISMS SEEN Performed at [facility] [address] [place],  [address]    Culture PENDING    Report Status PENDING   Glucose, capillary     Status: Abnormal   Collection Time: 05/16/16  5:20 PM  Result Value Ref Range   Glucose-Capillary 167 (H) 65 - 99 mg/dL  Glucose, capillary     Status: Abnormal   Collection Time: 05/16/16  8:34 PM  Result Value Ref Range   Glucose-Capillary 211 (H) 65 - 99 mg/dL   Glucose, capillary     Status: Abnormal   Collection Time: 05/16/16 10:08 PM  Result Value Ref Range   Glucose-Capillary 200 (H) 65 - 99 mg/dL  Basic metabolic panel     Status: Abnormal   Collection Time: 05/17/16  4:12 AM  Result Value Ref Range   Sodium 138 135 - 145 mmol/L   Potassium 4.5 3.5 - 5.1 mmol/L   Chloride 101 101 - 111 mmol/L   CO2 30 22 - 32 mmol/L   Glucose, Bld 141 (H) 65 - 99 mg/dL   BUN 23 (H) 6 - 20 mg/dL   Creatinine, Ser 2.39 (H) 0.61 - 1.24 mg/dL   Calcium 9.1 8.9 - 10.3 mg/dL   GFR calc non Af Amer 24 (L) >60 mL/min   GFR calc Af Amer 28 (L) >60 mL/min   Anion gap 7 5 - 15  Glucose, capillary     Status: Abnormal   Collection Time: 05/17/16  7:29 AM  Result Value Ref Range   Glucose-Capillary 212 (H) 65 - 99 mg/dL    Studies/Results: Dg Chest 1 View  Result Date: 05/16/2016 CLINICAL DATA:  Status post right-sided thoracentesis. EXAM: CHEST 1 VIEW COMPARISON:  Study obtained earlier in the day. FINDINGS: Most of the right pleural effusion noted previously has been removed. No pneumothorax.  A small amount of pleural effusion remains on the right. There is consolidation in the right base medially. Left lung is clear. There is apparent eventration of the right hemidiaphragm. Heart is mildly enlarged with pulmonary vascularity within normal limits. Pacemaker leads are attached to the right atrium, right ventricle, and coronary sinus. No adenopathy. There is atherosclerotic calcification in the aorta. No bone lesions. IMPRESSION: No pneumothorax post thoracentesis. Small amount of residual right pleural effusion as well as right base consolidation. Eventration right hemidiaphragm. Left lung clear. Stable cardiac prominence. There is aortic atherosclerosis Electronically Signed   By: Lowella Grip III M.D.   On: 05/16/2016 14:17   Dg Chest Port 1 View  Result Date: 05/16/2016 CLINICAL DATA:  Followup right pleural effusion. Chronic systolic congestive heart  failure. Chronic kidney disease. EXAM: PORTABLE CHEST 1 VIEW COMPARISON:  05/15/2016 FINDINGS: Stable cardiomegaly. AICD remains in appropriate position. Prior CABG. Mild improvement in right mid and lower lung airspace opacity since prior study. Small to moderate right pleural effusion again noted. Left lung remains clear. IMPRESSION: Mild improvement in right mid and lower lung airspace opacity. Small to moderate right pleural effusion. Stable cardiomegaly. Electronically Signed   By: Earle Gell M.D.   On: 05/16/2016 11:09   Dg Abd 2 Views  Result Date: 05/16/2016 CLINICAL DATA:  Fecal impaction. Recent right thoracentesis for pleural effusion. EXAM: ABDOMEN - 2 VIEW COMPARISON:  CT on 05/13/2016 FINDINGS: No evidence of dilated bowel loops. Small to moderate stool seen throughout colon. The no evidence of free intraperitoneal air. Right pleural effusion with small pneumothorax component seen, consistent with recent thoracentesis. IMPRESSION: Unremarkable bowel gas pattern.  Small to moderate stool burden. Small right hydro-pneumothorax, status post recent right thoracentesis. Electronically Signed   By: Earle Gell M.D.   On: 05/16/2016 11:19   US Thoracentesis Asp Pleural Space W/img Guide  Result Date: 05/16/2016 INDICATION: Coronary artery disease/prior CABG, chronic kidney disease, CHF, recurrent right pleural effusion. Request made for diagnostic and therapeutic right thoracentesis. EXAM: ULTRASOUND GUIDED DIAGNOSTIC AND THERAPEUTIC RIGHT THORACENTESIS MEDICATIONS: None. COMPLICATIONS: None immediate. PROCEDURE: An ultrasound guided thoracentesis was thoroughly discussed with the patient and questions answered. The benefits, risks, alternatives and complications were also discussed. The patient understands and wishes to proceed with the procedure. Written consent was obtained. Ultrasound was performed to localize and mark an adequate pocket of fluid in the right chest. The area was then prepped and  draped in the normal sterile fashion. 1% Lidocaine was used for local anesthesia. Under ultrasound guidance a Safe-T-Centesis catheter was introduced. Thoracentesis was performed. The catheter was removed and a dressing applied. FINDINGS: A total of approximately 1.8 liters of hazy, amber fluid was removed. Samples were sent to the laboratory as requested by the clinical team. IMPRESSION: Successful ultrasound guided diagnostic and therapeutic right thoracentesis yielding 1.8 liters of pleural fluid. Read by: Rowe Robert, PA-C Electronically Signed   By: Corrie Mckusick D.O.   On: 05/16/2016 14:02      Assessment: Multiple medical problems including pneumonia, pleural effusion, ischemic cardiomyopathy, and now iron deficiency anemia with heme positive stool.  Plan:   Currently I don't think he would tolerate a colonoscopy. I would recommend postponing any endoscopic evaluation at this time until his overall medical situation has improved. I think he would be high risk for anesthesia. I will go ahead and feed him.    SAM F Valeria Boza 05/17/2016, 9:30 AM  Pager: [phone] If no answer or after 5 PM call [phone]  Lab Results  Component Value Date   HGB 9.3 (L) 05/15/2016   HGB 9.5 (L) 05/13/2016   HGB 9.9 (L) 03/17/2016   HGB 12.0 (L) 08/01/2014   HCT 30.1 (L) 05/15/2016   HCT 30.0 (L) 05/13/2016   HCT 30.1 (L) 03/17/2016   HCT 36.9 (L) 08/01/2014   ALKPHOS 42 05/14/2016   ALKPHOS 44 05/13/2016   ALKPHOS 45 05/07/2016   ALKPHOS 56 08/01/2014   AST 13 (L) 05/14/2016   AST 14 (L) 05/13/2016   AST 8 05/07/2016   AST 17 08/01/2014   ALT 12 (L) 05/14/2016   ALT 10 (L) 05/13/2016   ALT 10 05/07/2016   ALT 12 (L) 08/01/2014

## 2016-05-17 NOTE — Evaluation (Signed)
Physical Therapy Evaluation Patient Details Name: Valery Chance MRN: [id number] DOB: 05/01/1934 Today's Date: 05/17/2016   History of Present Illness  82 yo male admitted with pleural effusion, fecal impaction, R pneumothorax. Hx of CAD, CABG, DM, PTSD, anemia, COPD, MI, LBBB  Clinical Impression  On eval, pt required Max assist +2 for mobility. Max multimodal cueing required for participation. Pt would stare off into space despite verbal and tactile cueing. Very minimal verbal responses (once or twice those responses were difficulty to understand/garbled). Also noted very slow responses verbally and physically. RN came into room while therapist was getting pt to EOB. Made her aware of pt's current presentation. Assisted pt back to supine and RN took over care. Pt presents with general weakness, decreased activity tolerance, and impaired gait and balance. Will follow and progress activity as tolerated.     Follow Up Recommendations SNF    Equipment Recommendations   (continuing to assess)    Recommendations for Other Services       Precautions / Restrictions Precautions Precautions: Fall Restrictions Weight Bearing Restrictions: No      Mobility  Bed Mobility Overal bed mobility: Needs Assistance Bed Mobility: Supine to Sit;Sit to Supine     Supine to sit: Max assist;+2 for physical assistance;+2 for safety/equipment;HOB elevated Sit to supine: Max assist;+2 for physical assistance;+2 for safety/equipment   General bed mobility comments: Assist for trunk and bil LEs. Max multimodal cueing required. Utilized bedpad for scooting, positioning.   Transfers                 General transfer comment: NT-pt unable  Ambulation/Gait                Stairs            Wheelchair Mobility    Modified Rankin (Stroke Patients Only)       Balance Overall balance assessment: Needs assistance Sitting-balance support: Feet supported;Feet unsupported Sitting  balance-Leahy Scale: Poor Sitting balance - Comments: Initially unable to sit at EOB without assistance. Pt was eventually able to prop on R hand to maintain static sitting balance for brief periods.  Postural control: Right lateral lean;Posterior lean                                   Pertinent Vitals/Pain Pain Assessment: Faces Faces Pain Scale: No hurt    Home Living Family/patient expects to be discharged to:: Unsure Living Arrangements: Spouse/significant other   Type of Home: House         Home Equipment: Kasandra Knudsen - single point      Prior Function Level of Independence: Independent with assistive device(s)         Comments: ambulatory with a cane per wife (pt has not walked in about a week prior to admission per wife)     Hand Dominance        Extremity/Trunk Assessment   Upper Extremity Assessment Upper Extremity Assessment: Difficult to assess due to impaired cognition (pt was able to squeeze fingers and move UEs with multimodal cueing. Strength ~2/5 at least)    Lower Extremity Assessment Lower Extremity Assessment: Difficult to assess due to impaired cognition (pt was able to move feet and perform heel slide with multimodal cueing. Strength ~2/5 at least. )       Communication   Communication: Expressive difficulties (on eval however this is not pt's baseline)  Cognition Arousal/Alertness: Lethargic Behavior During  Therapy: Flat affect Overall Cognitive Status: Impaired/Different from baseline Area of Impairment: Attention;Following commands;Problem solving       Following Commands: Follows one step commands inconsistently     Problem Solving: Slow processing;Decreased initiation;Difficulty sequencing;Requires verbal cues;Requires tactile cues General Comments: pt tended to just stare off into space despite cueing. very minimal verbal responses that were sometimes garbled/hard to understand.     General Comments      Exercises      Assessment/Plan    PT Assessment Patient needs continued PT services  PT Problem List Decreased strength;Decreased mobility;Decreased activity tolerance;Decreased balance;Obesity;Decreased cognition;Decreased knowledge of use of DME          PT Treatment Interventions DME instruction;Gait training;Therapeutic activities;Therapeutic exercise;Patient/family education;Balance training;Functional mobility training    PT Goals (Current goals can be found in the Care Plan section)  Acute Rehab PT Goals Patient Stated Goal: none stated by pt. wife would like pt to be able to come home. PT Goal Formulation: With family Time For Goal Achievement: 05/31/16 Potential to Achieve Goals: Good    Frequency Min 3X/week   Barriers to discharge        Co-evaluation               End of Session   Activity Tolerance: Patient limited by lethargy (Limited by cognition) Patient left: in bed;with call bell/phone within reach;with family/visitor present;with nursing/sitter in room           Time: 1052-1110 PT Time Calculation (min) (ACUTE ONLY): 18 min   Charges:   PT Evaluation $PT Eval Moderate Complexity: 1 Procedure     PT G Codes:        Weston Anna, MPT Pager: [phone]

## 2016-05-17 NOTE — Progress Notes (Signed)
Bladder Scan showed 697 ml in bladder

## 2016-05-18 ENCOUNTER — Inpatient Hospital Stay (HOSPITAL_COMMUNITY): Payer: PPO

## 2016-05-18 DIAGNOSIS — J95811 Postprocedural pneumothorax: Secondary | ICD-10-CM

## 2016-05-18 DIAGNOSIS — R918 Other nonspecific abnormal finding of lung field: Secondary | ICD-10-CM

## 2016-05-18 DIAGNOSIS — J90 Pleural effusion, not elsewhere classified: Secondary | ICD-10-CM

## 2016-05-18 LAB — CBC
HCT: 29.8 % — ABNORMAL LOW (ref 39.0–52.0)
Hemoglobin: 9.2 g/dL — ABNORMAL LOW (ref 13.0–17.0)
MCH: 27.3 pg (ref 26.0–34.0)
MCHC: 30.9 g/dL (ref 30.0–36.0)
MCV: 88.4 fL (ref 78.0–100.0)
PLATELETS: 245 10*3/uL (ref 150–400)
RBC: 3.37 MIL/uL — ABNORMAL LOW (ref 4.22–5.81)
RDW: 14.7 % (ref 11.5–15.5)
WBC: 9.9 10*3/uL (ref 4.0–10.5)

## 2016-05-18 LAB — CULTURE, BLOOD (ROUTINE X 2)
Culture: NO GROWTH
Culture: NO GROWTH

## 2016-05-18 LAB — BASIC METABOLIC PANEL
Anion gap: 7 (ref 5–15)
BUN: 25 mg/dL — AB (ref 6–20)
CO2: 31 mmol/L (ref 22–32)
CREATININE: 2.36 mg/dL — AB (ref 0.61–1.24)
Calcium: 9.2 mg/dL (ref 8.9–10.3)
Chloride: 98 mmol/L — ABNORMAL LOW (ref 101–111)
GFR calc Af Amer: 28 mL/min — ABNORMAL LOW (ref >60)
GFR, EST NON AFRICAN AMERICAN: 25 mL/min — AB (ref >60)
Glucose, Bld: 174 mg/dL — ABNORMAL HIGH (ref 65–99)
Potassium: 4.8 mmol/L (ref 3.5–5.1)
SODIUM: 136 mmol/L (ref 135–145)

## 2016-05-18 LAB — GLUCOSE, CAPILLARY
GLUCOSE-CAPILLARY: 156 mg/dL — AB (ref 65–99)
GLUCOSE-CAPILLARY: 219 mg/dL — AB (ref 65–99)
Glucose-Capillary: 140 mg/dL — ABNORMAL HIGH (ref 65–99)
Glucose-Capillary: 250 mg/dL — ABNORMAL HIGH (ref 65–99)

## 2016-05-18 NOTE — Consult Note (Signed)
Name: [name] [name] MRN: [id number] DOB: 14-Sep-1934    ADMISSION DATE:  05/13/2016 CONSULTATION DATE:  05/18/16  REFERRING MD :  [facility]  CHIEF COMPLAINT:  Lung mass   SIGNIFICANT EVENTS    STUDIES:  Thoracentesis 2/7, 2/9   HISTORY OF PRESENT ILLNESS:   82 year old male former smoker seen for pulmonary consultation is requested because of right lower lobe mass on chest CT. Presented with recent cough, intermittent fever, 30-40 pound weight loss in the last 3 months, black watery diarrhea and bowel impaction. Hypoxia 88% saturation noted in the ED. I have reviewed recent CT chest demonstrating 10 cm right lower lobe mass with residual right pleural effusion. He has had 2 thoracentesis 2 since admission with residual small right pneumothorax. By verbal report initial cytology negative for malignancy. Chemistry is exudative with few WBCs. He is very noncommunicative, speaking in mumbled one and 2 word responses but seems aware and comprehending. His wife speaks for him. Together they deny significant dyspnea, chest pain, recognized adenopathy or productive cough. Complicated past medical history includes PTSD,coronary artery disease (ischemic cardiomyopathy) status post CABG with a last EF in 2017 that showed an EF of 35%,  kidney disease stage III, insulin-dependent diabetes mellitus, anemia of chronic diseases . Pacemaker/ ICD.  PAST MEDICAL HISTORY :   has a past medical history of Anxiety; Arthritis; Boil; CAD (coronary artery disease); Chronic kidney disease; COPD (chronic obstructive pulmonary disease) ([place]) (1993); Deafness in left ear; Diabetes mellitus, type 2 ([place]) (2000); Elbow mass; Foot fracture (1953; 1962); Hyperlipidemia (1993); Hypertension (1993); Ischemic cardiomyopathy; LBBB (left bundle branch block); Myocardial infarction (2008; 01/06/2012); Nightmares; PTSD (post-traumatic stress disorder); Shortness of breath (01/05/2012); Syncope and collapse (01/05/2012); Systolic  heart failure; and Tinnitus of left ear.  has a past surgical history that includes Coronary artery bypass graft (2008); Appendectomy (1957); Fracture surgery (3491; ~ 1962); Insert / replace / remove pacemaker (01/30/2012); bi-ventricular implantable cardioverter defibrillator upgrade (N/A, 01/30/2012); Cardiac defibrillator placement (01/30/2012); and Cardiac defibrillator placement (01/30/2012). Prior to Admission medications   Medication Sig Start Date End Date Taking? Authorizing Provider  acetaminophen (TYLENOL) 500 MG tablet Take 1,000 mg by mouth daily.   Yes Historical Provider, MD  amLODipine (NORVASC) 5 MG tablet Take 5 mg by mouth at bedtime.    Yes Historical Provider, MD  aspirin 81 MG tablet Take 81 mg by mouth at bedtime.    Yes Historical Provider, MD  carvedilol (COREG) 25 MG tablet TAKE 1 & 1/2 TABLETS BY MOUTH TWICE A DAY 03/25/16  Yes Tonia Ghent, MD  diazepam (VALIUM) 10 MG tablet TAKE 1/2 TO 1 TABLET BY MOUTH AT [facility] NEEDED FOR ANXIETY OR RINGING IN EARS 04/03/16  Yes Tonia Ghent, MD  ferrous sulfate 325 (65 FE) MG tablet TAKE 1 TABLET BY MOUTH ONCE A DAY WITH BREAKFAST Patient taking differently: TAKE 1 TABLET BY MOUTH ONCE at night 05/05/16  Yes Tonia Ghent, MD  Insulin Disposable Pump (V-GO 20) KIT USE DAILY AS DIRECTED 05/05/16  Yes Elayne Snare, MD  Insulin Pen Needle (PEN NEEDLES) 31G X 5 MM MISC Inject 15 Units as directed 2 (two) times daily. Use pen needles for pt insulin pen. Dx E11.29 04/11/15  Yes Tonia Ghent, MD  Multiple Vitamins-Minerals (BONE SMART PO) Take 1 tablet by mouth daily.   Yes Historical Provider, MD  nitroGLYCERIN (NITROSTAT) 0.4 MG SL tablet Place 1 tablet (0.4 mg total) under the tongue every 5 (five) minutes as needed. For chest pain  09/14/12  Yes Wellington Hampshire, MD  NOVOLOG 100 UNIT/ML injection USE MAXIMUM OF 56 UNITS PER DAY WITH V-GO PUMP 04/14/16  Yes Elayne Snare, MD  Nutritional Supplements (QUINOA KALE & HEMP PO) Take by mouth  daily.   Yes Historical Provider, MD  [facility] DELICA LANCETS FINE MISC Use to check blood sugar 3 times per day dx code 250.42 08/30/13  Yes Elayne Snare, MD  [facility] VERIO test strip USE AS DIRECTED TO CHECK BLOOD SUGAR THREE TIMES PER DAY 05/05/16  Yes Tonia Ghent, MD  pregabalin (LYRICA) 25 MG capsule Take 1 capsule (25 mg total) by mouth 2 (two) times daily. 03/25/16  Yes Larey Dresser, MD  rosuvastatin (CRESTOR) 40 MG tablet TAKE 1 TABLET BY MOUTH ONCE A DAY 05/05/16  Yes Larey Dresser, MD  spironolactone (ALDACTONE) 25 MG tablet Take 0.5 tablets (12.5 mg total) by mouth daily. Patient taking differently: Take 12.5 mg by mouth at bedtime.  06/11/15  Yes Larey Dresser, MD  torsemide (DEMADEX) 20 MG tablet Take 1 tablet (20 mg total) by mouth every other day. 04/14/16  Yes Satira Mccallum Tillery, PA-C  VICTOZA 18 MG/3ML SOPN Inject 0.2 mLs (1.2 mg total) into the skin daily. Inject once daily at the same time Patient taking differently: Inject 1.2 mg into the skin daily. Only takes when blood sugar is high 10/24/15  Yes Elayne Snare, MD  cephALEXin (KEFLEX) 500 MG capsule Take 1 capsule (500 mg total) by mouth 4 (four) times daily. Patient not taking: Reported on 05/13/2016 04/08/16   Tonia Ghent, MD   Allergies  Allergen Reactions  . Sulfa Antibiotics Swelling and Rash    Hands swell    FAMILY HISTORY:  family history includes Cancer in his maternal grandmother; Diabetes in his sister; Heart disease in his father; Hyperlipidemia in his sister; Prostate cancer in his father. SOCIAL HISTORY:  reports that he has quit smoking. His smoking use included Cigarettes. He has a 17.50 pack-year smoking history. He has never used smokeless tobacco. He reports that he drinks about 4.2 oz of alcohol per week . He reports that he does not use drugs.  REVIEW OF SYSTEMS:   ROS-see HPI   Negative unless "+" Constitutional:    +weight loss, night sweats, +fevers, chills, +fatigue, lassitude. HEENT:     headaches, difficulty swallowing, tooth/dental problems, sore throat,       sneezing, itching, ear ache, nasal congestion, post nasal drip, snoring CV:    chest pain, orthopnea, PND, swelling in lower extremities, anasarca,                                                       dizziness, palpitations Resp:   shortness of breath with exertion or at rest.                productive cough,   +non-productive cough, coughing up of blood.              change in color of mucus.  wheezing.   Skin:    rash or lesions. GI:  No-   heartburn, indigestion, abdominal pain, nausea, vomiting, +diarrhea,                 +change in bowel habits, loss of appetite GU: dysuria, change in color of urine,  no urgency or frequency.   flank pain. MS:   joint pain, stiffness, decreased range of motion, back pain. Neuro-     nothing unusual Psych:  change in mood or affect.  depression or anxiety.   memory loss.    SUBJECTIVE:   VITAL SIGNS: Temp:  [97.7 F (36.5 C)-98.9 F (37.2 C)] 97.7 F (36.5 C) (02/11 1420) Pulse Rate:  [71-86] 71 (02/11 1420) Resp:  [15-16] 16 (02/11 1420) BP: (103-135)/(50-57) 103/50 (02/11 1420) SpO2:  [96 %-98 %] 96 % (02/11 1420)  PHYSICAL EXAMINATION: General:  Calm, obese male lying supine, NAD Neuro:  Speaks only one or 2 word responses, very softly. Wife understands him. I'm not sure how completely he understands me. Follows simple commands. HEENT:  Neck supple, no stridor, mucosa clear Cardiovascular:  Pacer/ICD left chest, rrr. No m/g/r, no JVD Lungs:  Markedly diminished sounds right chest with no rub, few crackles Abdomen: Softly protuberant, nontender. Very active borborygmi Musculoskeletal:  Symmetrical muscle bulk Skin:  No rash or ecchymoses noted   Recent Labs Lab 05/16/16 0652 05/17/16 0412 05/18/16 0400  NA 137 138 136  K 4.4 4.5 4.8  CL 100* 101 98*  CO2 _0 BUN 23* 23* 25*  CREATININE 2.17* 2.39* 2.36*  GLUCOSE 152* 141* 174*    Recent  Labs Lab 05/15/16 1135 05/17/16 1031 05/18/16 0400  HGB 9.3* 9.3* 9.2*  HCT 30.1* 30.3* 29.8*  WBC 8.7 8.1 9.9  PLT 249 254 245   Ct Head Wo Contrast  Result Date: 05/18/2016 CLINICAL DATA:  Increased lethargy, altered mental status EXAM: CT HEAD WITHOUT CONTRAST TECHNIQUE: Contiguous axial images were obtained from the base of the skull through the vertex without intravenous contrast. COMPARISON:  03/21/2015 FINDINGS: Brain: No evidence of acute infarction, hemorrhage, extra-axial collection, ventriculomegaly, or mass effect. Generalized cerebral atrophy. Periventricular white matter low attenuation likely secondary to microangiopathy. Vascular: Cerebrovascular atherosclerotic calcifications are noted. Skull: Negative for fracture or focal lesion. Sinuses/Orbits: Visualized portions of the orbits are unremarkable. Visualized portions of the paranasal sinuses and mastoid air cells are unremarkable. Other: None. IMPRESSION: 1. No acute intracranial pathology. 2. Chronic microvascular disease and cerebral atrophy. Electronically Signed   By: Kathreen Devoid   On: 05/18/2016 12:13   Ct Chest Wo Contrast  Result Date: 05/18/2016 CLINICAL DATA:  RIGHT effusion.  Thoracentesis 05/16/2016 EXAM: CT CHEST WITHOUT CONTRAST TECHNIQUE: Multidetector CT imaging of the chest was performed following the standard protocol without IV contrast. COMPARISON:  CT 05/13/2016 FINDINGS: Cardiovascular: Coronary artery calcification and aortic atherosclerotic calcification. Mediastinum/Nodes: No axillary or supraclavicular adenopathy. Pacemaker in the LEFT chest wall. No mediastinal hilar adenopathy. No pericardial fluid. Esophagus normal. Lungs/Pleura: There is a small moderate this anterior RIGHT pneumothorax. The estimated volume is approximately 5 and 10%. RIGHT pleural effusion remaining is decreased in volume from comparison CT. There is however a residual mass within the RIGHT lower lobe measuring 10 .1 x 9.8 cm (image  101, series 2) and this occupies the entirety of the RIGHT lower lobe. LEFT lung is clear Upper Abdomen: No dilated loops of large or small bowel. No pathologic calcifications. No organomegaly. No aggressive osseous lesion. No intraperitoneal free air. Musculoskeletal: Lucent lesion in the T3 vertebral body is well circumscribed in the represent hemangioma. IMPRESSION: 1. Small anterior RIGHT pneumothorax following thoracentesis 05/13/2016. 2. Decrease in pleural effusion following thoracentesis. 3. Persistent RIGHT lower lobe mass occupying the near entirety of the RIGHT lower lobe measuring up to 10 cm. Differential  would include rounded pneumonia versus carcinoma. Recommend contrast CT or FDG PET scan or bronchoscopy for further evaluation to exclude LUNG CARCINOMA. Critical Value/emergent results were called by telephone at the time of interpretation on 05/18/2016 at 12:28 pm to nurse KIM, who verbally acknowledged these results. Electronically Signed   By: Suzy Bouchard M.D.   On: 05/18/2016 12:31   Dg Chest Port 1 View  Result Date: 05/17/2016 CLINICAL DATA:  Hypoxia and right sided pleural effusion. H/o CAD, ischemic cardiomyopathy, myocardial infarction, COPD, type 2 diabetes. Surgical h/o CABG in 2008 and cardiac defibrillator placement in 2013. Former smoker. EXAM: PORTABLE CHEST - 1 VIEW COMPARISON:  05/16/2016 FINDINGS: Stable left subclavian AICD. Previous median sternotomy and CABG. Low lung volumes with crowding of bibasilar bronchovascular structures. Heart size upper limits normal for technique.  Atheromatous aorta. Blunting of bilateral lateral costophrenic angles right worse than left ; can't exclude small pleural effusions. IMPRESSION: 1. Low volumes with bibasilar crowding, possible small effusions. Electronically Signed   By: Lucrezia Europe M.D.   On: 05/17/2016 12:44   I reviewed CT chest images and agree, noting small right pneumothorax, right pleural effusion, large right lower lobe mass  which distorts and may obstructe airways  ASSESSMENT / PLAN:  Right lung mass-peripheral WBC is up to 12,000 but with possible sources in chest as well as abdomen. First pleural fluid results do not show significant WBCs, make parapneumonic effusion unlikely. Given his apparent altered mental status and his known fecal impaction, and aspiration pneumonia is not excluded. I favor this being bronchogenic carcinoma until proven otherwise. Since he has had 2 thoracenteses, it is appropriate to confirm nondiagnostic results from these. Absent other indications, I have told the patient and his wife that I will be recommending bronchoscopy which we discussed in preliminary outline. This could not be scheduled before Tuesday and I will discuss it with my group tomorrow morning.  Right pleural effusion-exudative-this is likely secondary to the lung mass process and not related to his fecal impaction unless additional abdominal diagnoses are identified.  Right pneumothorax after procedure. This can be monitored for resolution by chest x-ray.   CD Young M.D. Pulmonary and [place] Pager: [phone]  05/18/2016, 2:57 PM

## 2016-05-18 NOTE — Progress Notes (Signed)
PROGRESS NOTE    [name] [name]  VHQ:[id number] DOB: Jul 31, 1934 DOA: 05/13/2016 PCP: Elsie Stain, MD   Brief Narrative: [name] [name] is a 82 y.o. male with medical history significant of past medical history of coronary artery disease (ischemic cardiomyopathy) status post CABG with a last EF in 2017 that showed an EF of 35%, and a kidney disease stage III, insulin-dependent diabetes mellitus anemia of chronic diseases   Assessment & Plan:   Active Problems:   Chronic systolic CHF (congestive heart failure) (HCC)   Stage 3 chronic renal impairment associated with type 2 diabetes mellitus (HCC)   Anemia, iron deficiency   Pleural effusion on right   CAP (community acquired pneumonia)   Microcytic anemia   Melena   Fecal impaction (HCC)   Acute respiratory failure with hypoxia (HCC)   AKI (acute kidney injury) ([place])   CKD (chronic kidney disease), stage III   Acute respiratory failure ?Community acquired pneumonia Right pleural effusion S/p thoracentesis with 2L removed. Fluid appears exudative. No bacteria. No cancer seen on cytology. Repeat thoracentesis with 1.8L removed. Repeat chest x-ray significant for small amount of effusion.  -continue Ceftriaxone and azithromycin -culture pending -CT chest w/o contrast (CKD)  Somnolence Still somnolent. Has not had Lyrica for 24 hours. Seems more alert today, however. I think sleep apnea could be playing a role. Since this appears somewhat acute, will obtain imaging -CT head -CPAP qhs -discontinue Lyrica  Right hydropneumothorax Resolved.  Fecal impaction Constipation Resolved after enema. Moderate stool burden on KUB -continue Miralax. May need to decrease frequency depending on bowel movement frequency  AKI on CKD stage III Has stabilized. Baseline around 1.8 to 2.0  Microcytic anemia Melena No history of colonoscopy. CT scan not significant for obvious masses. GI consulted -GI recommendations: not planning  colonoscopy until lung pathology is fleshed out  Diabetes mellitus type 2 -continue Lantus -continue SSI  S/p CABG No chest pain.  Chronic systolic CHF Euvolemic. AICD in place. -continue to hold aldactone and Demadex in setting of AKI  Urinary retension Possibly secondary to fecal impaction. Repeat bladder scan with foley removed significant for retention. No medications that could be contributing. -continue foley   DVT prophylaxis: SCDs Code Status: Full code Family Communication: None at bedside Disposition Plan: Discharge pending continued workup   Consultants:   Gastroenterology  Procedures:   None  Antimicrobials:   Ceftriaxone  Azithromycin   Subjective: Still sleepy. Had a loose bowel movement overnight. Weak.  Objective: Vitals:   05/17/16 1434 05/17/16 1810 05/17/16 2146 05/18/16 0505  BP: (!) 114/54 (!) 129/57 (!) 132/54 (!) 135/57  Pulse: 71 74 77 86  Resp: '18  16 15  '$ Temp:   98.2 F (36.8 C) 98.9 F (37.2 C)  TempSrc:   Oral Oral  SpO2: 98%  98% 98%  Weight:      Height:        Intake/Output Summary (Last 24 hours) at 05/18/16 1257 Last data filed at 05/18/16 1217  Gross per 24 hour  Intake              620 ml  Output             1300 ml  Net             -680 ml   Filed Weights   05/13/16 1908  Weight: 108.7 kg (239 lb 11.2 oz)    Examination:  General exam: Appears calm and comfortable. Somnolent Respiratory system: Decreased breath sounds in RLL.  Cardiovascular system: S1 & S2 heard, Normal rate with regular rhythm. Gastrointestinal system: Abdomen is nondistended, soft and nontender. Normal bowel sounds heard. Central nervous system: somnolent and oriented. No focal neurological deficits. CN intact except CN VIII impairment. Extremities: No edema. No calf tenderness Skin: No cyanosis. No rashes Psychiatry: Judgement and insight appear normal. Mood & affect appropriate.    Data Reviewed: I have personally reviewed  following labs and imaging studies  CBC:  Recent Labs Lab 05/13/16 1159 05/15/16 1135 05/17/16 1031 05/18/16 0400  WBC 12.9* 8.7 8.1 9.9  NEUTROABS 11.1*  --   --   --   HGB 9.5* 9.3* 9.3* 9.2*  HCT 30.0* 30.1* 30.3* 29.8*  MCV 88.2 90.4 89.6 88.4  PLT 268 249 254 919   Basic Metabolic Panel:  Recent Labs Lab 05/13/16 2040 05/14/16 0433 05/15/16 1135 05/16/16 0652 05/17/16 0412 05/18/16 0400  NA  --  140 135 137 138 136  K  --  4.2 4.6 4.4 4.5 4.8  CL  --  104 100* 100* 101 98*  CO2  --  32 '28 29 30 31  '$ GLUCOSE  --  120* 272* 152* 141* 174*  BUN  --  29* 26* 23* 23* 25*  CREATININE  --  1.94* 2.32* 2.17* 2.39* 2.36*  CALCIUM  --  9.3 8.6* 9.1 9.1 9.2  MG 2.1  --   --   --   --   --    GFR: Estimated Creatinine Clearance: 29.8 mL/min (by C-G formula based on SCr of 2.36 mg/dL (H)). Liver Function Tests:  Recent Labs Lab 05/13/16 1800 05/14/16 0433  AST 14* 13*  ALT 10* 12*  ALKPHOS 44 42  BILITOT 0.5 0.4  PROT 7.1 6.6  ALBUMIN 3.2* 3.1*   BNP (last 3 results)  Recent Labs  04/08/16 1529  PROBNP 511.0*   HbA1C: No results for input(s): HGBA1C in the last 72 hours. CBG:  Recent Labs Lab 05/17/16 1131 05/17/16 1720 05/17/16 2143 05/18/16 0740 05/18/16 1137  GLUCAP 252* 225* 186* 140* 156*   Lipid Profile: No results for input(s): CHOL, HDL, LDLCALC, TRIG, CHOLHDL, LDLDIRECT in the last 72 hours. Thyroid Function Tests: No results for input(s): TSH, T4TOTAL, FREET4, T3FREE, THYROIDAB in the last 72 hours. Anemia Panel: No results for input(s): VITAMINB12, FOLATE, FERRITIN, TIBC, IRON, RETICCTPCT in the last 72 hours. Sepsis Labs: No results for input(s): PROCALCITON, LATICACIDVEN in the last 168 hours.  Recent Results (from the past 240 hour(s))  Urine culture     Status: None   Collection Time: 05/13/16  1:16 PM  Result Value Ref Range Status   Specimen Description URINE, CLEAN CATCH  Final   Special Requests NONE  Final   Culture    Final    NO GROWTH Performed at [facility], [address], [place], [place] [address]    Report Status 05/15/2016 FINAL  Final  Blood culture (routine x 2)     Status: None (Preliminary result)   Collection Time: 05/13/16  6:00 PM  Result Value Ref Range Status   Specimen Description BLOOD LEFT HAND  Final   Special Requests BOTTLES DRAWN AEROBIC AND ANAEROBIC 5CC  Final   Culture   Final    NO GROWTH 4 DAYS Performed at [facility], [place] [address], [place], [place] [address]    Report Status PENDING  Incomplete  Blood culture (routine x 2)     Status: None (Preliminary result)   Collection  Time: 05/13/16  6:20 PM  Result Value Ref Range Status   Specimen Description BLOOD RIGHT HAND  Final   Special Requests IN PEDIATRIC BOTTLE 2CC  Final   Culture   Final    NO GROWTH 4 DAYS Performed at [facility], [address] [place], [place] [address]    Report Status PENDING  Incomplete  Culture, body fluid-bottle     Status: None (Preliminary result)   Collection Time: 05/14/16 11:29 AM  Result Value Ref Range Status   Specimen Description PLEURAL RIGHT  Final   Special Requests NONE  Final   Culture   Final    NO GROWTH 3 DAYS Performed at [facility] [address] [place], [place] [address]    Report Status PENDING  Incomplete  Gram stain     Status: None   Collection Time: 05/14/16 11:29 AM  Result Value Ref Range Status   Specimen Description PLEURAL RIGHT  Final   Special Requests NONE  Final   Gram Stain   Final    RARE WBC PRESENT,BOTH PMN AND MONONUCLEAR NO ORGANISMS SEEN Performed at [facility], [place] [address], [place], [place] [address]    Report Status 05/14/2016 FINAL  Final  Body fluid culture     Status: None (Preliminary result)   Collection Time: 05/16/16  1:30 PM  Result Value Ref Range Status   Specimen Description Pleural R  Final   Special Requests NONE  Final   Gram Stain   Final    RARE WBC PRESENT,  PREDOMINANTLY MONONUCLEAR NO ORGANISMS SEEN    Culture   Final    NO GROWTH 2 DAYS Performed at [facility], [place] [address], [place], [place] [address]    Report Status PENDING  Incomplete         Radiology Studies: Dg Chest 1 View  Result Date: 05/16/2016 CLINICAL DATA:  Status post right-sided thoracentesis. EXAM: CHEST 1 VIEW COMPARISON:  Study obtained earlier in the day. FINDINGS: Most of the right pleural effusion noted previously has been removed. No pneumothorax. A small amount of pleural effusion remains on the right. There is consolidation in the right base medially. Left lung is clear. There is apparent eventration of the right hemidiaphragm. Heart is mildly enlarged with pulmonary vascularity within normal limits. Pacemaker leads are attached to the right atrium, right ventricle, and coronary sinus. No adenopathy. There is atherosclerotic calcification in the aorta. No bone lesions. IMPRESSION: No pneumothorax post thoracentesis. Small amount of residual right pleural effusion as well as right base consolidation. Eventration right hemidiaphragm. Left lung clear. Stable cardiac prominence. There is aortic atherosclerosis Electronically Signed   By: Lowella Grip III M.D.   On: 05/16/2016 14:17   Ct Head Wo Contrast  Result Date: 05/18/2016 CLINICAL DATA:  Increased lethargy, altered mental status EXAM: CT HEAD WITHOUT CONTRAST TECHNIQUE: Contiguous axial images were obtained from the base of the skull through the vertex without intravenous contrast. COMPARISON:  03/21/2015 FINDINGS: Brain: No evidence of acute infarction, hemorrhage, extra-axial collection, ventriculomegaly, or mass effect. Generalized cerebral atrophy. Periventricular white matter low attenuation likely secondary to microangiopathy. Vascular: Cerebrovascular atherosclerotic calcifications are noted. Skull: Negative for fracture or focal lesion. Sinuses/Orbits: Visualized portions of the orbits are  unremarkable. Visualized portions of the paranasal sinuses and mastoid air cells are unremarkable. Other: None. IMPRESSION: 1. No acute intracranial pathology. 2. Chronic microvascular disease and cerebral atrophy. Electronically Signed   By: Kathreen Devoid  On: 05/18/2016 12:13   Ct Chest Wo Contrast  Result Date: 05/18/2016 CLINICAL DATA:  RIGHT effusion.  Thoracentesis 05/16/2016 EXAM: CT CHEST WITHOUT CONTRAST TECHNIQUE: Multidetector CT imaging of the chest was performed following the standard protocol without IV contrast. COMPARISON:  CT 05/13/2016 FINDINGS: Cardiovascular: Coronary artery calcification and aortic atherosclerotic calcification. Mediastinum/Nodes: No axillary or supraclavicular adenopathy. Pacemaker in the LEFT chest wall. No mediastinal hilar adenopathy. No pericardial fluid. Esophagus normal. Lungs/Pleura: There is a small moderate this anterior RIGHT pneumothorax. The estimated volume is approximately 5 and 10%. RIGHT pleural effusion remaining is decreased in volume from comparison CT. There is however a residual mass within the RIGHT lower lobe measuring 10 .1 x 9.8 cm (image 101, series 2) and this occupies the entirety of the RIGHT lower lobe. LEFT lung is clear Upper Abdomen: No dilated loops of large or small bowel. No pathologic calcifications. No organomegaly. No aggressive osseous lesion. No intraperitoneal free air. Musculoskeletal: Lucent lesion in the T3 vertebral body is well circumscribed in the represent hemangioma. IMPRESSION: 1. Small anterior RIGHT pneumothorax following thoracentesis 05/13/2016. 2. Decrease in pleural effusion following thoracentesis. 3. Persistent RIGHT lower lobe mass occupying the near entirety of the RIGHT lower lobe measuring up to 10 cm. Differential would include rounded pneumonia versus carcinoma. Recommend contrast CT or FDG PET scan or bronchoscopy for further evaluation to exclude LUNG CARCINOMA. Critical Value/emergent results were called by  telephone at the time of interpretation on 05/18/2016 at 12:28 pm to nurse KIM, who verbally acknowledged these results. Electronically Signed   By: Suzy Bouchard M.D.   On: 05/18/2016 12:31   Dg Chest Port 1 View  Result Date: 05/17/2016 CLINICAL DATA:  Hypoxia and right sided pleural effusion. H/o CAD, ischemic cardiomyopathy, myocardial infarction, COPD, type 2 diabetes. Surgical h/o CABG in 2008 and cardiac defibrillator placement in 2013. Former smoker. EXAM: PORTABLE CHEST - 1 VIEW COMPARISON:  05/16/2016 FINDINGS: Stable left subclavian AICD. Previous median sternotomy and CABG. Low lung volumes with crowding of bibasilar bronchovascular structures. Heart size upper limits normal for technique.  Atheromatous aorta. Blunting of bilateral lateral costophrenic angles right worse than left ; can't exclude small pleural effusions. IMPRESSION: 1. Low volumes with bibasilar crowding, possible small effusions. Electronically Signed   By: Lucrezia Europe M.D.   On: 05/17/2016 12:44   US Thoracentesis Asp Pleural Space W/img Guide  Result Date: 05/16/2016 INDICATION: Coronary artery disease/prior CABG, chronic kidney disease, CHF, recurrent right pleural effusion. Request made for diagnostic and therapeutic right thoracentesis. EXAM: ULTRASOUND GUIDED DIAGNOSTIC AND THERAPEUTIC RIGHT THORACENTESIS MEDICATIONS: None. COMPLICATIONS: None immediate. PROCEDURE: An ultrasound guided thoracentesis was thoroughly discussed with the patient and questions answered. The benefits, risks, alternatives and complications were also discussed. The patient understands and wishes to proceed with the procedure. Written consent was obtained. Ultrasound was performed to localize and mark an adequate pocket of fluid in the right chest. The area was then prepped and draped in the normal sterile fashion. 1% Lidocaine was used for local anesthesia. Under ultrasound guidance a Safe-T-Centesis catheter was introduced. Thoracentesis was  performed. The catheter was removed and a dressing applied. FINDINGS: A total of approximately 1.8 liters of hazy, amber fluid was removed. Samples were sent to the laboratory as requested by the clinical team. IMPRESSION: Successful ultrasound guided diagnostic and therapeutic right thoracentesis yielding 1.8 liters of pleural fluid. Read by: Rowe Robert, PA-C Electronically Signed   By: Corrie Mckusick D.O.   On: 05/16/2016 14:02  Scheduled Meds: . acetaminophen  1,000 mg Oral Daily  . amLODipine  5 mg Oral QHS  . azithromycin  500 mg Oral Q24H  . carvedilol  12.5 mg Oral BID WC  . cefTRIAXone (ROCEPHIN)  IV  1 g Intravenous Q24H  . insulin aspart  0-5 Units Subcutaneous QHS  . insulin aspart  0-9 Units Subcutaneous TID WC  . insulin aspart  5 Units Subcutaneous TID WC  . insulin detemir  5 Units Subcutaneous QHS  . polyethylene glycol  17 g Oral Q4H while awake  . rosuvastatin  40 mg Oral Daily   Continuous Infusions:    LOS: 5 days     Cordelia Poche [facility] 05/18/2016, 12:57 PM Pager: [phone]  If 7PM-7AM, please contact night-coverage [email or web address] Password [facility] 05/18/2016, 12:57 PM

## 2016-05-18 NOTE — NC FL2 (Signed)
[place] LEVEL OF CARE SCREENING TOOL     IDENTIFICATION  Patient Name: [name] [name] Birthdate: 1934/12/26 Sex: male Admission Date (Current Location): 05/13/2016  [facility] and [place] Number:  Herbalist and Address:  [facility],  [place], [place]      Provider Number: [id number]  Attending Physician Name and Address:  Mariel Aloe, MD  Relative Name and Phone Number:       Current Level of Care: Hospital Recommended Level of Care: [place] Prior Approval Number:    Date Approved/Denied:   PASRR Number: [id number]  Discharge Plan: SNF    Current Diagnoses: Patient Active Problem List   Diagnosis Date Noted  . Right lower lobe lung mass 05/18/2016  . Postprocedural pneumothorax 05/18/2016  . Pleural effusion on right 05/13/2016  . CAP (community acquired pneumonia) 05/13/2016  . Microcytic anemia 05/13/2016  . Melena 05/13/2016  . Fecal impaction ([place]) 05/13/2016  . Acute respiratory failure with hypoxia ([place]) 05/13/2016  . AKI (acute kidney injury) ([place]) 05/13/2016  . CKD (chronic kidney disease), stage III 05/13/2016  . Rash and nonspecific skin eruption 04/09/2016  . Elevated serum creatinine 03/20/2016  . Anemia, iron deficiency 04/05/2015  . Memory change 03/12/2015  . Pustules determined by examination 09/10/2014  . Frequent falls 08/04/2014  . Diarrhea 08/04/2014  . Stage 3 chronic renal impairment associated with type 2 diabetes mellitus ([place]) 11/25/2013  . Coronary atherosclerosis of native coronary artery 10/13/2013  . Routine general medical examination at a health care facility 06/12/2013  . PAD (peripheral artery disease) ([place]) 09/14/2012  . OSA (obstructive sleep apnea) 05/06/2012  . Other primary cardiomyopathies 05/05/2012  . Vertigo 03/10/2012  . Obese 03/10/2012  . NSTEMI (non-ST elevated myocardial infarction) ([place]) 01/07/2012  . Chronic systolic CHF (congestive  heart failure) ([place]) 01/06/2012  . Knee pain 06/25/2011  . Sensation disturbance of skin 01/10/2011  . Nightmares 01/10/2011  . Anxiety 11/26/2010  . SOB (shortness of breath) 10/22/2010  . Automatic implantable cardioverter-defibrillator in situ 03/18/2010  . DIZZINESS 08/13/2009  . Type II diabetes mellitus with renal manifestations, uncontrolled ([place]) 07/25/2009  . GERD 06/22/2008  . CAD (coronary artery disease) 06/04/2008  . TINNITUS 11/09/2006  . ADVEF, DRUG/MEDICINAL/BIOLOGICAL SUBST NOS 09/14/2006  . Hyperlipidemia 08/12/2006  . Essential hypertension 08/12/2006  . COPD 08/12/2006  . NICOTINE ADDICTION 08/10/2006  . AMI, NON-Q WAVE, INITIAL EPISODE 08/10/2006  . Ischemic cardiomyopathy 08/10/2006  . ERECTILE DYSFUNCTION, ORGANIC 08/10/2006  . Insomnia 08/10/2006    Orientation RESPIRATION BLADDER Height & Weight     Self  O2 (3L [place]) Incontinent, Indwelling catheter Weight: 239 lb 11.2 oz (108.7 kg) Height:  '5\' 7"'$  (170.2 cm)  BEHAVIORAL SYMPTOMS/MOOD NEUROLOGICAL BOWEL NUTRITION STATUS      Incontinent Diet (see DC summary)  AMBULATORY STATUS COMMUNICATION OF NEEDS Skin   Extensive Assist Verbally Normal                       Personal Care Assistance Level of Assistance  Bathing, Dressing, Feeding Bathing Assistance: Maximum assistance Feeding assistance: Limited assistance Dressing Assistance: Maximum assistance     Functional Limitations Info             SPECIAL CARE FACTORS FREQUENCY  PT (By licensed PT), OT (By licensed OT)     PT Frequency: 5/wk OT Frequency: 5/wk            Contractures  Additional Factors Info  Code Status, Allergies Code Status Info: FULL Allergies Info: Sulfa Antibiotics           Current Medications (05/18/2016):  This is the current hospital active medication list Current Facility-Administered Medications  Medication Dose Route Frequency Provider Last Rate Last Dose  . acetaminophen (TYLENOL) tablet  1,000 mg  1,000 mg Oral Daily Mariel Aloe, MD   1,000 mg at 05/18/16 1123  . amLODipine (NORVASC) tablet 5 mg  5 mg Oral QHS Charlynne Cousins, MD   5 mg at 05/17/16 2208  . azithromycin [facility]) tablet 500 mg  500 mg Oral Q24H Charlynne Cousins, MD   500 mg at 05/17/16 1758  . carvedilol (COREG) tablet 12.5 mg  12.5 mg Oral BID WC Charlynne Cousins, MD   12.5 mg at 05/18/16 0902  . cefTRIAXone (ROCEPHIN) 1 g in dextrose 5 % 50 mL IVPB  1 g Intravenous Q24H Charlynne Cousins, MD   1 g at 05/17/16 1802  . diazepam (VALIUM) tablet 5 mg  5 mg Oral QHS PRN Charlynne Cousins, MD   5 mg at 05/13/16 2138  . insulin aspart (novoLOG) injection 0-5 Units  0-5 Units Subcutaneous QHS Charlynne Cousins, MD   3 Units at 05/15/16 2150  . insulin aspart (novoLOG) injection 0-9 Units  0-9 Units Subcutaneous TID WC Charlynne Cousins, MD   2 Units at 05/18/16 1215  . insulin aspart (novoLOG) injection 5 Units  5 Units Subcutaneous TID WC Mariel Aloe, MD      . insulin detemir (LEVEMIR) injection 5 Units  5 Units Subcutaneous QHS Charlynne Cousins, MD   5 Units at 05/17/16 2208  . polyethylene glycol (MIRALAX / GLYCOLAX) packet 17 g  17 g Oral Q4H while awake Teena Irani, MD   17 g at 05/18/16 1125  . rosuvastatin (CRESTOR) tablet 40 mg  40 mg Oral Daily Charlynne Cousins, MD   40 mg at 05/18/16 1124     Discharge Medications: Please see discharge summary for a list of discharge medications.  Relevant Imaging Results:  Relevant Lab Results:   Additional Information SS#: [id number]  Jorge Ny, LCSW

## 2016-05-19 ENCOUNTER — Inpatient Hospital Stay (HOSPITAL_COMMUNITY): Payer: PPO

## 2016-05-19 DIAGNOSIS — J181 Lobar pneumonia, unspecified organism: Secondary | ICD-10-CM

## 2016-05-19 DIAGNOSIS — J90 Pleural effusion, not elsewhere classified: Secondary | ICD-10-CM

## 2016-05-19 DIAGNOSIS — J690 Pneumonitis due to inhalation of food and vomit: Secondary | ICD-10-CM

## 2016-05-19 DIAGNOSIS — N5082 Scrotal pain: Secondary | ICD-10-CM

## 2016-05-19 LAB — GLUCOSE, CAPILLARY
GLUCOSE-CAPILLARY: 156 mg/dL — AB (ref 65–99)
GLUCOSE-CAPILLARY: 207 mg/dL — AB (ref 65–99)
GLUCOSE-CAPILLARY: 163 mg/dL — AB (ref 65–99)
GLUCOSE-CAPILLARY: 137 mg/dL — AB (ref 65–99)

## 2016-05-19 LAB — CULTURE, BODY FLUID W GRAM STAIN -BOTTLE: Culture: NO GROWTH

## 2016-05-19 MED ORDER — POLYETHYLENE GLYCOL 3350 17 G PO PACK
17.0000 g | PACK | Freq: Two times a day (BID) | ORAL | Status: DC
  Administered 2016-05-21 – 2016-05-23 (×3): 17 g via ORAL
  Filled 2016-05-19 (×7): qty 1

## 2016-05-19 NOTE — Progress Notes (Signed)
[facility] PROGRESS NOTE Subjective patient very somnolent wife is in the room. He had a massive bowel movement yesterday. Along mass has been discovered on CT scan and pulmonary is planning bronchoscopy tomorrow. Suspicious for lung carcinoma.  Objective: Vital signs in last 24 hours: Temp:  [97.7 F (36.5 C)-98.6 F (37 C)] 98.6 F (37 C) (02/12 0512) Pulse Rate:  [71-91] 90 (02/12 0816) Resp:  [16-18] 18 (02/12 0512) BP: (103-135)/(50-70) 127/70 (02/12 0816) SpO2:  [95 %-99 %] 96 % (02/12 0512) Last BM Date: 05/18/16  Intake/Output from previous day: 02/11 0701 - 02/12 0700 In: 890 [P.O.:840; IV Piggyback:50] Out: 2774 [Urine:1450] Intake/Output this shift: No intake/output data recorded.  PE: General-- very somnolent and barely arousable  Abdomen-- obese, minimally distended nontender  Lab Results:  Recent Labs  05/17/16 1031 05/18/16 0400  WBC 8.1 9.9  HGB 9.3* 9.2*  HCT 30.3* 29.8*  PLT 254 245   BMET  Recent Labs  05/17/16 0412 05/18/16 0400  NA 138 136  K 4.5 4.8  CL 101 98*  CO2 30 31  CREATININE 2.39* 2.36*   LFT No results for input(s): PROT, AST, ALT, ALKPHOS, BILITOT, BILIDIR, IBILI in the last 72 hours. PT/INR No results for input(s): LABPROT, INR in the last 72 hours. PANCREAS No results for input(s): LIPASE in the last 72 hours.       Studies/Results: Ct Head Wo Contrast  Result Date: 05/18/2016 CLINICAL DATA:  Increased lethargy, altered mental status EXAM: CT HEAD WITHOUT CONTRAST TECHNIQUE: Contiguous axial images were obtained from the base of the skull through the vertex without intravenous contrast. COMPARISON:  03/21/2015 FINDINGS: Brain: No evidence of acute infarction, hemorrhage, extra-axial collection, ventriculomegaly, or mass effect. Generalized cerebral atrophy. Periventricular white matter low attenuation likely secondary to microangiopathy. Vascular: Cerebrovascular atherosclerotic calcifications are noted.  Skull: Negative for fracture or focal lesion. Sinuses/Orbits: Visualized portions of the orbits are unremarkable. Visualized portions of the paranasal sinuses and mastoid air cells are unremarkable. Other: None. IMPRESSION: 1. No acute intracranial pathology. 2. Chronic microvascular disease and cerebral atrophy. Electronically Signed   By: Kathreen Devoid   On: 05/18/2016 12:13   Ct Chest Wo Contrast  Result Date: 05/18/2016 CLINICAL DATA:  RIGHT effusion.  Thoracentesis 05/16/2016 EXAM: CT CHEST WITHOUT CONTRAST TECHNIQUE: Multidetector CT imaging of the chest was performed following the standard protocol without IV contrast. COMPARISON:  CT 05/13/2016 FINDINGS: Cardiovascular: Coronary artery calcification and aortic atherosclerotic calcification. Mediastinum/Nodes: No axillary or supraclavicular adenopathy. Pacemaker in the LEFT chest wall. No mediastinal hilar adenopathy. No pericardial fluid. Esophagus normal. Lungs/Pleura: There is a small moderate this anterior RIGHT pneumothorax. The estimated volume is approximately 5 and 10%. RIGHT pleural effusion remaining is decreased in volume from comparison CT. There is however a residual mass within the RIGHT lower lobe measuring 10 .1 x 9.8 cm (image 101, series 2) and this occupies the entirety of the RIGHT lower lobe. LEFT lung is clear Upper Abdomen: No dilated loops of large or small bowel. No pathologic calcifications. No organomegaly. No aggressive osseous lesion. No intraperitoneal free air. Musculoskeletal: Lucent lesion in the T3 vertebral body is well circumscribed in the represent hemangioma. IMPRESSION: 1. Small anterior RIGHT pneumothorax following thoracentesis 05/13/2016. 2. Decrease in pleural effusion following thoracentesis. 3. Persistent RIGHT lower lobe mass occupying the near entirety of the RIGHT lower lobe measuring up to 10 cm. Differential would include rounded pneumonia versus carcinoma. Recommend contrast CT or FDG PET scan or  bronchoscopy for further evaluation to  exclude LUNG CARCINOMA. Critical Value/emergent results were called by telephone at the time of interpretation on 05/18/2016 at 12:28 pm to nurse KIM, who verbally acknowledged these results. Electronically Signed   By: Suzy Bouchard M.D.   On: 05/18/2016 12:31   Dg Chest Port 1 View  Result Date: 05/19/2016 CLINICAL DATA:  Follow-up right pneumothorax. EXAM: PORTABLE CHEST 1 VIEW COMPARISON:  CT chest 05/18/2016 FINDINGS: Persistent right lower lobe airspace opacity. Small right pleural effusion. Mild left basilar atelectasis. No pneumothorax. Stable cardiomegaly. Prior CABG. Three lead cardiac pacemaker. IMPRESSION: No right pneumothorax. Persistent right lower lobe airspace opacity concerning for a mass. Electronically Signed   By: Kathreen Devoid   On: 05/19/2016 10:53    Medications: I have reviewed the patient's current medications.  Assessment/Plan: 1. Fecal impaction. This appears to have resolved Miralax. His main problem at this point appears to be his lung mass which is quite suspicious for lung tumor. I don't feel that he needs a colonoscopy at this point in time. Clearly the mass in his long as the more pressing issue. I would continue Miralax for the foreseeable future. Please call us back if things change and it is felt that colonoscopy is needed.   Quinlee Sciarra JR,Meilyn Heindl L 05/19/2016, 1:08 PM  This note was created using voice recognition software. Minor errors may Have occurred unintentionally.  Pager: [phone] If no answer or after hours call [phone]

## 2016-05-19 NOTE — Progress Notes (Signed)
Pulmonary & Critical Care Attending Note  Presenting HPI:  82 y.o. male with history of tobacco use. Patient with right pleural effusion status post thoracentesis 2 during this admission. Presenting complaint cough with intermittent fever and weight loss for approximately 3 months. Patient has also had known coughing/dysphagia with meals.  Subjective:  Patient remains somewhat altered and slow to answer questions. Seems to deny any pain or difficulty breathing. Wife does confirm coughing and dysphagia with oral intake. No acute events overnight.  Review of Systems:  Unable to obtain an accurate review of systems due to altered mentation.   Temp:  [97.5 F (36.4 C)-98.6 F (37 C)] 97.5 F (36.4 C) (02/12 1403) Pulse Rate:  [80-91] 80 (02/12 1403) Resp:  [18] 18 (02/12 1403) BP: (81-135)/(52-70) 81/52 (02/12 1403) SpO2:  [95 %-99 %] 95 % (02/12 1403)  General:  Awake. No acute distress. Wife at bedside. Integument:  Warm & dry. No rash or bruising on exposed skin. HEENT:  No scleral icterus or injection. Pupils symmetric.  Pulmonary:  Slightly decreased breath sounds on the right. No accessory muscle use on nasal cannula oxygen.  Cardiovascular:  Regular rate. No JVD apprecaited. No edema. Abdomen:  Soft. Nontender. Normal bowel sounds. Protuberant. Neurological:  Cranial nerves grossly in tact. No meningismus. Slow to answer questions. Is aware of year, president & location.  CBC Latest Ref Rng & Units 05/18/2016 05/17/2016 05/15/2016  WBC 4.0 - 10.5 K/uL 9.9 8.1 8.7  Hemoglobin 13.0 - 17.0 g/dL 9.2(L) 9.3(L) 9.3(L)  Hematocrit 39.0 - 52.0 % 29.8(L) 30.3(L) 30.1(L)  Platelets 150 - 400 K/uL 245 254 249    BMP Latest Ref Rng & Units 05/18/2016 05/17/2016 05/16/2016  Glucose 65 - 99 mg/dL 174(H) 141(H) 152(H)  BUN 6 - 20 mg/dL 25(H) 23(H) 23(H)  Creatinine 0.61 - 1.24 mg/dL 2.36(H) 2.39(H) 2.17(H)  Sodium 135 - 145 mmol/L 136 138 137  Potassium 3.5 - 5.1 mmol/L 4.8 4.5 4.4   Chloride 101 - 111 mmol/L 98(L) 101 100(L)  CO2 22 - 32 mmol/L '31 30 29  '$ Calcium 8.9 - 10.3 mg/dL 9.2 9.1 9.1    IMAGING/STUDIES: Right Pleural Fluid Cytology 05/14/16:  No malignant cells  Right Pleural Fluid Cytology 05/16/16 >>>  CT CHEST W/O 05/18/16:  Personally reviewed by me. Persistent right lower lobe opacity. Right-sided hydropneumothorax with some pleural thickening suspicious for trapped lung. No pathologic mediastinal adenopathy. No pericardial effusion.  MICROBIOLOGY: Blood Cultures x2 2/6:  Negative Urine Culture 2/6:  Negative  Right Pleural Fluid Culture 2/7:  Negative Right Pleural Fluid Culture 2/9 >>> Urine Streptococcal Antigen 2/6:  Negative   ANTIBIOTICS: Azithromycin 2/6 >>> (2/14 stop date) Rocephin 2/6 >>> (2/14 stop date)  ASSESSMENT/PLAN:  82 y.o. male with what seems to be chronic aspiration. Reviewing his chest imaging does show consolidation within his right lower lobe which could be due to chronic aspiration or possibly an underlying malignancy. His pleural fluid is a concordant exudate by my review and could be consistent with malignancy or chronic aspiration pneumonia. Repeat cytology is pending. Given his somewhat altered mentation I am concerned about placing him under conscious sedation. I discussed this with the patient and his wife at bedside. 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  1. Concordant exudate right pleural effusion: Awaiting repeat pleural fluid cytology & finalization of cultures. Holding off on bronchoscopy given the concerns as noted above.  2. Right lower lobe consolidation versus mass: Again holding off on bronchoscopy given the concerns as noted above. Would favor a PET/CT scan prior to attempting fiberoptic bronchoscopy with conscious sedation or aggressive procedures given the potential for a biopsy site that may be  less risky for the patient if this is indeed malignancy. 3. Probable chronic aspiration: I have ordered a speech therapy evaluation for the patient's swallowing. 4. Probable aspiration pneumonia: Patient continuing on systemic antibiotics with Rocephin and azithromycin. Appears nontoxic. Stop dates have been placed by previous physician.  Remainder of care as per primary service.  I have spent a total of 36 minutes of time today caring for the patient, reviewing the patient's electronic medical record, and with more than 50% of that time spent coordinating care with the patient's transfer as well as reviewing the continuing plan of care with the patient and his wife at bedside.  Sonia Baller Ashok Cordia, M.D. [facility] Pulmonary & Critical Care Pager:  [phone] After 3pm or if no response, call [phone] 3:05 PM 05/19/16

## 2016-05-19 NOTE — Progress Notes (Signed)
Nutrition Brief Note  Patient identified on Low Braden score list.  Pt with stable weight since March 2017. Currently consuming 100% of meals.  Wt Readings from Last 15 Encounters:  05/13/16 239 lb 11.2 oz (108.7 kg)  04/14/16 239 lb 6.4 oz (108.6 kg)  04/08/16 238 lb 8 oz (108.2 kg)  03/25/16 231 lb 1.9 oz (104.8 kg)  03/17/16 233 lb 12 oz (106 kg)  01/24/16 235 lb 6.4 oz (106.8 kg)  01/04/16 241 lb 6.4 oz (109.5 kg)  12/28/15 240 lb 8 oz (109.1 kg)  10/24/15 246 lb (111.6 kg)  10/05/15 241 lb 1.9 oz (109.4 kg)  08/02/15 235 lb 1.9 oz (106.6 kg)  07/25/15 241 lb 3.2 oz (109.4 kg)  06/25/15 239 lb 8 oz (108.6 kg)  06/11/15 247 lb (112 kg)  05/25/15 247 lb 12.8 oz (112.4 kg)    Body mass index is 37.54 kg/m. Patient meets criteria for obesity based on current BMI.   Current diet order is regular, patient is consuming approximately 100% of meals at this time. Labs and medications reviewed.   No nutrition interventions warranted at this time. If nutrition issues arise, please consult RD.   Clayton Bibles, MS, RD, LDN Pager: [phone] After Hours Pager: [phone]

## 2016-05-19 NOTE — Care Management Important Message (Signed)
Important Message  Patient Details  Name: [name] [name] MRN: [id number] Date of Birth: [birth year]   Medicare Important Message Given:  Yes    Kerin Salen 05/19/2016, 9:32 AMImportant Message  Patient Details  Name: [name] [name] MRN: [id number] Date of Birth: 1934-04-18   Medicare Important Message Given:  Yes    Kerin Salen 05/19/2016, 9:32 AM

## 2016-05-19 NOTE — Progress Notes (Signed)
Name: [name] [name] MRN: [id number] DOB: 12/25/1934    ADMISSION DATE:  05/13/2016 CONSULTATION DATE:  05/18/16  REFERRING MD :  [facility]/ [facility]  CHIEF COMPLAINT:  Lung mass   SIGNIFICANT EVENTS   HISTORY OF PRESENT ILLNESS:    82 year old male former smoker seen for pulmonary consultation is requested because of right lower lobe mass on chest CT. Presented with recent cough, intermittent fever, 30-40 pound weight loss in the last 3 months, black watery diarrhea and bowel impaction. Hypoxia 88% saturation noted in the ED. I have reviewed recent CT chest demonstrating 10 cm right lower lobe mass with residual right pleural effusion. He has had 2 thoracentesis 2 since admission with residual small right pneumothorax. By verbal report initial cytology negative for malignancy. Chemistry is exudative with few WBCs.  SUBJECTIVE:   VITAL SIGNS: Temp:  [97.7 F (36.5 C)-98.6 F (37 C)] 98.6 F (37 C) (02/12 0512) Pulse Rate:  [71-91] 90 (02/12 0816) Resp:  [16-18] 18 (02/12 0512) BP: (103-135)/(50-70) 127/70 (02/12 0816) SpO2:  [95 %-99 %] 96 % (02/12 0512)  Intake/Output Summary (Last 24 hours) at 05/19/16 1013 Last data filed at 05/19/16 0513  Gross per 24 hour  Intake              890 ml  Output             1450 ml  Net             -560 ml   PHYSICAL EXAMINATION: General appearance: 82 Year old  Male, well nourished,NAD,conversant  Eyes: anicteric sclerae, moist conjunctivae; PERRL, EOMI bilaterally. Mouth:  membranes and no mucosal ulcerations; normal hard and soft palate Neck: Trachea midline; neck supple, no JVD Lungs/chest:decreased on right, with normal respiratory effort and no intercostal retractions CV: RRR, no MRGs  Abdomen: Soft, non-tender; no masses or HSM Extremities: No peripheral edema or extremity lymphadenopathy Skin: Normal temperature, turgor and texture; no rash, ulcers or subcutaneous nodules Psych: Appropriate affect, alert and oriented to person, place and  time  Recent Labs Lab 05/16/16 0652 05/17/16 0412 05/18/16 0400  NA 137 138 136  K 4.4 4.5 4.8  CL 100* 101 98*  CO2 '29 30 31  '$ BUN 23* 23* 25*  CREATININE 2.17* 2.39* 2.36*  GLUCOSE 152* 141* 174*    Recent Labs Lab 05/15/16 1135 05/17/16 1031 05/18/16 0400  HGB 9.3* 9.3* 9.2*  HCT 30.1* 30.3* 29.8*  WBC 8.7 8.1 9.9  PLT 249 254 245   Ct Head Wo Contrast  Result Date: 05/18/2016 CLINICAL DATA:  Increased lethargy, altered mental status EXAM: CT HEAD WITHOUT CONTRAST TECHNIQUE: Contiguous axial images were obtained from the base of the skull through the vertex without intravenous contrast. COMPARISON:  03/21/2015 FINDINGS: Brain: No evidence of acute infarction, hemorrhage, extra-axial collection, ventriculomegaly, or mass effect. Generalized cerebral atrophy. Periventricular white matter low attenuation likely secondary to microangiopathy. Vascular: Cerebrovascular atherosclerotic calcifications are noted. Skull: Negative for fracture or focal lesion. Sinuses/Orbits: Visualized portions of the orbits are unremarkable. Visualized portions of the paranasal sinuses and mastoid air cells are unremarkable. Other: None. IMPRESSION: 1. No acute intracranial pathology. 2. Chronic microvascular disease and cerebral atrophy. Electronically Signed   By: Kathreen Devoid   On: 05/18/2016 12:13   Ct Chest Wo Contrast  Result Date: 05/18/2016 CLINICAL DATA:  RIGHT effusion.  Thoracentesis 05/16/2016 EXAM: CT CHEST WITHOUT CONTRAST TECHNIQUE: Multidetector CT imaging of the chest was performed following the standard protocol without IV contrast. COMPARISON:  CT 05/13/2016  FINDINGS: Cardiovascular: Coronary artery calcification and aortic atherosclerotic calcification. Mediastinum/Nodes: No axillary or supraclavicular adenopathy. Pacemaker in the LEFT chest wall. No mediastinal hilar adenopathy. No pericardial fluid. Esophagus normal. Lungs/Pleura: There is a small moderate this anterior RIGHT  pneumothorax. The estimated volume is approximately 5 and 10%. RIGHT pleural effusion remaining is decreased in volume from comparison CT. There is however a residual mass within the RIGHT lower lobe measuring 10 .1 x 9.8 cm (image 101, series 2) and this occupies the entirety of the RIGHT lower lobe. LEFT lung is clear Upper Abdomen: No dilated loops of large or small bowel. No pathologic calcifications. No organomegaly. No aggressive osseous lesion. No intraperitoneal free air. Musculoskeletal: Lucent lesion in the T3 vertebral body is well circumscribed in the represent hemangioma. IMPRESSION: 1. Small anterior RIGHT pneumothorax following thoracentesis 05/13/2016. 2. Decrease in pleural effusion following thoracentesis. 3. Persistent RIGHT lower lobe mass occupying the near entirety of the RIGHT lower lobe measuring up to 10 cm. Differential would include rounded pneumonia versus carcinoma. Recommend contrast CT or FDG PET scan or bronchoscopy for further evaluation to exclude LUNG CARCINOMA. Critical Value/emergent results were called by telephone at the time of interpretation on 05/18/2016 at 12:28 pm to nurse KIM, who verbally acknowledged these results. Electronically Signed   By: Suzy Bouchard M.D.   On: 05/18/2016 12:31   Dg Chest Port 1 View  Result Date: 05/17/2016 CLINICAL DATA:  Hypoxia and right sided pleural effusion. H/o CAD, ischemic cardiomyopathy, myocardial infarction, COPD, type 2 diabetes. Surgical h/o CABG in 2008 and cardiac defibrillator placement in 2013. Former smoker. EXAM: PORTABLE CHEST - 1 VIEW COMPARISON:  05/16/2016 FINDINGS: Stable left subclavian AICD. Previous median sternotomy and CABG. Low lung volumes with crowding of bibasilar bronchovascular structures. Heart size upper limits normal for technique.  Atheromatous aorta. Blunting of bilateral lateral costophrenic angles right worse than left ; can't exclude small pleural effusions. IMPRESSION: 1. Low volumes with  bibasilar crowding, possible small effusions. Electronically Signed   By: Lucrezia Europe M.D.   On: 05/17/2016 12:44   STUDIES:  Thoracentesis 2/7, 2/9 (cytology negative from 2/7; pending from 2/9) CT head 2/11; negative for acute process CT chest 2/11: 1. Small anterior RIGHT pneumothorax following thoracentesis 05/13/2016.2. Decrease in pleural effusion following thoracentesis. 3. Persistent RIGHT lower lobe mass occupying the near entirety of the RIGHT lower lobe measuring up to 10 cm. Differential would include rounded pneumonia versus carcinoma.  ASSESSMENT / PLAN:  Right sided lung mass w/ right exudative pleural effusion Small right anterior pneumothorax (resolved & I favor ex vacuo over actual pneumothorax).  Chronic dysphagia  Systolic CM w/ EF 34%  Discussion D-dx include cancer, cancer w/ postobstructive process or PNA. Given what sounds like chronic aspiration this is not out of the question. He needs FOB for tissue sampling byt Plan Repeat CXR imaging today Cont abx  F/u second pending cytology  Needs swallow eval His MS and lethargy make FOB higher risk, will defer this to Dr Ashok Cordia but I'd like to see him a little stronger   Erick Colace ACNP-BC [place] Pager # [phone] OR # [phone] if no answer   05/19/2016, 10:13 AM

## 2016-05-19 NOTE — Progress Notes (Signed)
Patient declines the use of nocturnal CPAP tonight. Equipment remains at bedside for further use if he should require assistance. Family member at bedside states he doesn't tolerate the mask well and continuously pulls it off in the night.

## 2016-05-19 NOTE — Clinical Social Work Note (Signed)
Clinical Social Work Assessment  Patient Details  Name: [name] [name] MRN: [id number] Date of Birth: 1934/11/29  Date of referral:  05/19/16               Reason for consult:  Discharge Planning                Permission sought to share information with:  Chartered certified accountant granted to share information::  Yes, Verbal Permission Granted  Name::        Agency::     Relationship::     Contact Information:     Housing/Transportation Living arrangements for the past 2 months:  Single Family Home Source of Information:  Spouse Patient Interpreter Needed:  None Criminal Activity/Legal Involvement Pertinent to Current Situation/Hospitalization:  No - Comment as needed Significant Relationships:  Spouse Lives with:  Spouse Do you feel safe going back to the place where you live?   (SNF recommended.) Need for family participation in patient care:  Yes (Comment)  Care giving concerns:  Spouse thinks pt may need more help than is available at home.   Social Worker assessment / plan:  Pt hospitalized on 05/13/16 from home with Acute respiratory failure with hypoxia. Pt is oriented to person only and unable to participate in d/c planning. CSW spoke with pt's spouse this am to review PT recommendations. Spouse is in agreement with SNF placement at d/c. SNF search has been initiated and bed offers are pending. CSW will review bed offers with spouse once available and will continue to follow to assist with d/c planning to SNF.  Employment status:  Retired Nurse, adult PT Recommendations:  [place] / Referral to community resources:  [place]  Patient/Family's Response to care:  Spouse feels SNF placement is needed.  Patient/Family's Understanding of and Emotional Response to Diagnosis, Current Treatment, and Prognosis:  Spouse is aware of pt's medical status. " My husband is having a procedure tomorrow.  He won't be leaving today. " CSW reassured spouse that pt won't be d/c until MD feels pt is stable for d/c. Support provided. Spouse appreciates CSW assistance with d/c planning.  Emotional Assessment Appearance:  Appears stated age Attitude/Demeanor/Rapport:  Other (cooperative) Affect (typically observed):  Calm Orientation:  Oriented to Self Alcohol / Substance use:  Not Applicable Psych involvement (Current and /or in the community):  No (Comment)  Discharge Needs  Concerns to be addressed:  Discharge Planning Concerns Readmission within the last 30 days:  No Current discharge risk:  None Barriers to Discharge:  No Barriers Identified   Loraine Maple  [phone] 05/19/2016, 11:56 AM

## 2016-05-19 NOTE — Progress Notes (Signed)
Physical Therapy Treatment Patient Details Name: [name] [name] MRN: [id number] DOB: [birth year] Today's Date: 05/19/2016    History of Present Illness 82 yo male admitted with pleural effusion, lung mass, fecal impaction, R pneumothorax. Hx of CAD, CABG, DM, PTSD, anemia, COPD, MI, LBBB    PT Comments    The patient is more alert and able  To participate in mobility. The wife expresses desire for patient to return home. Continue to assess as medical is sorted out.   Follow Up Recommendations  SNF;Supervision/Assistance - 24 hour     Equipment Recommendations   (continue to assess)    Recommendations for Other Services       Precautions / Restrictions Precautions Precautions: Fall    Mobility  Bed Mobility   Bed Mobility: Supine to Sit     Supine to sit: Mod assist     General bed mobility comments: Assist for trunk and bil LEs.The patientn had right leg over bed edge already. Utilized bedpad for scooting, positioning.   Transfers Overall transfer level: Needs assistance   Transfers: Sit to/from Stand Sit to Stand: Max assist;+2 physical assistance;+2 safety/equipment;Mod assist;From elevated surface         General transfer comment: hand over hand placement of the  patient's hand to the [facility]. Patient lifted legs to place them o the platform. Stood x 2 from bed. Stood for [employer]. placed in recliner with 3 more practices for sit to stand  with the STEDY. Stood and held self at the bar x 3 minutes each  Trial.  Ambulation/Gait                 Stairs            Wheelchair Mobility    Modified Rankin (Stroke Patients Only)       Balance Overall balance assessment: Needs assistance Sitting-balance support: Feet supported Sitting balance-Leahy Scale: Poor Sitting balance - Comments: today, listing to the left , max assist initiallly for balance. gradually gained trunk control and required min assist  but remained  to the left tilt Postural  control: Posterior lean;Left lateral lean                          Cognition Arousal/Alertness: Awake/alert Behavior During Therapy: Flat affect Overall Cognitive Status: Impaired/Different from baseline Area of Impairment: Attention;Following commands;Problem solving       Following Commands: Follows one step commands inconsistently     Problem Solving: Slow processing;Decreased initiation;Difficulty sequencing;Requires verbal cues;Requires tactile cues General Comments: more alert this visit,  speech is mumbling at times, at times able  to be understood.  Multiple times noted to stare off. Cues to reengage in activity    Exercises      General Comments        Pertinent Vitals/Pain Pain Assessment: Faces Faces Pain Scale: No hurt    Home Living                      Prior Function            PT Goals (current goals can now be found in the care plan section) Progress towards PT goals: Progressing toward goals    Frequency    Min 3X/week      PT Plan Current plan remains appropriate    Co-evaluation             End of Session Equipment Utilized During Treatment: Gait belt Activity Tolerance:  Patient tolerated treatment well Patient left: in chair;with call bell/phone within reach;with chair alarm set;with nursing/sitter in room;with family/visitor present     Time: 6144-3154 PT Time Calculation (min) (ACUTE ONLY): 21 min  Charges:  $Therapeutic Activity: 8-22 mins                    G Codes:      Claretha Cooper 05/19/2016, 12:53 PM Tresa Endo PT [phone]

## 2016-05-19 NOTE — Clinical Social Work Placement (Signed)
   CLINICAL SOCIAL WORK PLACEMENT  NOTE  Date:  05/19/2016  Patient Details  Name: [name] [name] MRN: [id number] Date of Birth: 27-Oct-1934  Clinical Social Work is seeking post-discharge placement for this patient at the [place] level of care (*CSW will initial, date and re-position this form in  chart as items are completed):  Yes   Patient/family provided with [facility] Work Department's list of facilities offering this level of care within the geographic area requested by the patient (or if unable, by the patient's family).  Yes   Patient/family informed of their freedom to choose among providers that offer the needed level of care, that participate in Medicare, Medicaid or managed care program needed by the patient, have an available bed and are willing to accept the patient.  Yes   Patient/family informed of [facility]'s ownership interest in [facility] and [facility], as well as of the fact that they are under no obligation to receive care at these facilities.  PASRR submitted to EDS on 05/18/16     PASRR number received on 05/18/16     Existing PASRR number confirmed on       FL2 transmitted to all facilities in geographic area requested by pt/family on 05/19/16     FL2 transmitted to all facilities within larger geographic area on       Patient informed that his/her managed care company has contracts with or will negotiate with certain facilities, including the following:            Patient/family informed of bed offers received.  Patient chooses bed at       Physician recommends and patient chooses bed at      Patient to be transferred to   on  .  Patient to be transferred to facility by       Patient family notified on   of transfer.  Name of family member notified:        PHYSICIAN       Additional Comment:    _______________________________________________ Luretha Rued, [place] 05/19/2016,  12:19 PM

## 2016-05-19 NOTE — Progress Notes (Addendum)
PROGRESS NOTE    [name] [name]  ZHG:[id number] DOB: 1934/12/16 DOA: 05/13/2016 PCP: Elsie Stain, MD   Brief Narrative: [name] [name] is a 82 y.o. male with medical history significant of past medical history of coronary artery disease (ischemic cardiomyopathy) status post CABG with a last EF in 2017 that showed an EF of 35%, and a kidney disease stage III, insulin-dependent diabetes mellitus anemia of chronic diseases   Assessment & Plan:   Active Problems:   Chronic systolic CHF (congestive heart failure) (HCC)   Stage 3 chronic renal impairment associated with type 2 diabetes mellitus (HCC)   Anemia, iron deficiency   Pleural effusion on right   CAP (community acquired pneumonia)   Microcytic anemia   Melena   Fecal impaction (HCC)   Acute respiratory failure with hypoxia (HCC)   AKI (acute kidney injury) ([place])   CKD (chronic kidney disease), stage III   Right lower lobe lung mass   Postprocedural pneumothorax   Acute respiratory failure ?Community acquired pneumonia Right pleural effusion S/p thoracentesis with 2L removed. Fluid appears exudative. No bacteria. No cancer seen on cytology. Repeat thoracentesis with 1.8L removed. Repeat chest x-ray significant for small amount of effusion. Repeat CT scan significant for mass concerning for malignancy -continue Ceftriaxone and azithromycin -repeat body fluid culture (NG x3 days)  Somnolence Mental status changes. Very difficult to gauge. No focal deficits. Patient is generally responsive to all questions but has episodes of possible confusion. No history of seizures. No fevers -CPAP qhs -discontinue Lyrica -neurology consult  Right hydropneumothorax Small pneumothorax seen on CT scan. Asymptomatic.  Fecal impaction Constipation Resolved after enema. Moderate stool burden on KUB -continue Miralax  AKI on CKD stage III Has stabilized. Baseline around 1.8 to 2.0  Microcytic anemia Melena No history of colonoscopy.  CT scan not significant for obvious masses. GI consulted -GI recommendations: not planning colonoscopy until lung issues resolved  Diabetes mellitus type 2 -continue Levemir -continue SSI  S/p CABG No chest pain.  Chronic systolic CHF Euvolemic. AICD in place. -continue to hold aldactone and Demadex in setting of AKI  Urinary retension Possibly secondary to fecal impaction. Repeat bladder scan with foley removed significant for retention. No medications that could be contributing. -continue foley -flomax  Scrotal pain Tender on palpation. No swelling appreciated.   DVT prophylaxis: SCDs Code Status: Full code Family Communication: None at bedside Disposition Plan: Discharge pending continued workup   Consultants:   Gastroenterology  Procedures:   None  Antimicrobials:   Ceftriaxone  Azithromycin   Subjective: Having scrotal pain. New since yesterday. Has not had anything to help with pain. No dysuria.  Objective: Vitals:   05/18/16 2019 05/18/16 2111 05/19/16 0512 05/19/16 0816  BP:  (!) 124/58 (!) 135/59 127/70  Pulse: 81 88 91 90  Resp: '18 18 18   '$ Temp:  98 F (36.7 C) 98.6 F (37 C)   TempSrc:  Axillary Axillary   SpO2: 95% 99% 96%   Weight:      Height:        Intake/Output Summary (Last 24 hours) at 05/19/16 1036 Last data filed at 05/19/16 0513  Gross per 24 hour  Intake              890 ml  Output             1450 ml  Net             -560 ml   [facility] Weights   05/13/16 1908  Weight: 108.7 kg (239 lb 11.2 oz)    Examination:  General exam: Appears calm and comfortable. Somnolent Respiratory system: Clear breath sounds, slightly diminished at bases Cardiovascular system: S1 & S2 heard, Normal rate with regular rhythm. Gastrointestinal system: Abdomen is nondistended, soft and nontender. Normal bowel sounds heard. GU: scrotum appears non-erythematous. Tenderness on palpation of bilateral testicles and vas deferens. Central nervous  system: somnolent and oriented. No focal neurological deficits. CN intact except CN VIII impairment. Extremities: No edema. No calf tenderness Skin: No cyanosis. No rashes Psychiatry: Judgement and insight appear normal. Mood & affect appropriate.    Data Reviewed: I have personally reviewed following labs and imaging studies  CBC:  Recent Labs Lab 05/13/16 1159 05/15/16 1135 05/17/16 1031 05/18/16 0400  WBC 12.9* 8.7 8.1 9.9  NEUTROABS 11.1*  --   --   --   HGB 9.5* 9.3* 9.3* 9.2*  HCT 30.0* 30.1* 30.3* 29.8*  MCV 88.2 90.4 89.6 88.4  PLT 268 249 254 081   Basic Metabolic Panel:  Recent Labs Lab 05/13/16 2040 05/14/16 0433 05/15/16 1135 05/16/16 0652 05/17/16 0412 05/18/16 0400  NA  --  140 135 137 138 136  K  --  4.2 4.6 4.4 4.5 4.8  CL  --  104 100* 100* 101 98*  CO2  --  32 '28 29 30 31  '$ GLUCOSE  --  120* 272* 152* 141* 174*  BUN  --  29* 26* 23* 23* 25*  CREATININE  --  1.94* 2.32* 2.17* 2.39* 2.36*  CALCIUM  --  9.3 8.6* 9.1 9.1 9.2  MG 2.1  --   --   --   --   --    GFR: Estimated Creatinine Clearance: 29.8 mL/min (by C-G formula based on SCr of 2.36 mg/dL (H)). Liver Function Tests:  Recent Labs Lab 05/13/16 1800 05/14/16 0433  AST 14* 13*  ALT 10* 12*  ALKPHOS 44 42  BILITOT 0.5 0.4  PROT 7.1 6.6  ALBUMIN 3.2* 3.1*   BNP (last 3 results)  Recent Labs  04/08/16 1529  PROBNP 511.0*   HbA1C: No results for input(s): HGBA1C in the last 72 hours. CBG:  Recent Labs Lab 05/18/16 0740 05/18/16 1137 05/18/16 1633 05/18/16 2056 05/19/16 0756  GLUCAP 140* 156* 219* 250* 156*   Lipid Profile: No results for input(s): CHOL, HDL, LDLCALC, TRIG, CHOLHDL, LDLDIRECT in the last 72 hours. Thyroid Function Tests: No results for input(s): TSH, T4TOTAL, FREET4, T3FREE, THYROIDAB in the last 72 hours. Anemia Panel: No results for input(s): VITAMINB12, FOLATE, FERRITIN, TIBC, IRON, RETICCTPCT in the last 72 hours. Sepsis Labs: No results for  input(s): PROCALCITON, LATICACIDVEN in the last 168 hours.  Recent Results (from the past 240 hour(s))  Urine culture     Status: None   Collection Time: 05/13/16  1:16 PM  Result Value Ref Range Status   Specimen Description URINE, CLEAN CATCH  Final   Special Requests NONE  Final   Culture   Final    NO GROWTH Performed at [facility], [address], [place], [place] [address]    Report Status 05/15/2016 FINAL  Final  Blood culture (routine x 2)     Status: None   Collection Time: 05/13/16  6:00 PM  Result Value Ref Range Status   Specimen Description BLOOD LEFT HAND  Final   Special Requests BOTTLES DRAWN AEROBIC AND ANAEROBIC 5CC  Final   Culture   Final    NO GROWTH 5  DAYS Performed at [facility], [place] [address], [place], [place] [address]    Report Status 05/18/2016 FINAL  Final  Blood culture (routine x 2)     Status: None   Collection Time: 05/13/16  6:20 PM  Result Value Ref Range Status   Specimen Description BLOOD RIGHT HAND  Final   Special Requests IN PEDIATRIC BOTTLE 2CC  Final   Culture   Final    NO GROWTH 5 DAYS Performed at [facility], [place] [address], [place], [place] [address]    Report Status 05/18/2016 FINAL  Final  Culture, body fluid-bottle     Status: None (Preliminary result)   Collection Time: 05/14/16 11:29 AM  Result Value Ref Range Status   Specimen Description PLEURAL RIGHT  Final   Special Requests NONE  Final   Culture   Final    NO GROWTH 4 DAYS Performed at [facility] [address], [place], [place] [address]    Report Status PENDING  Incomplete  Gram stain     Status: None   Collection Time: 05/14/16 11:29 AM  Result Value Ref Range Status   Specimen Description PLEURAL RIGHT  Final   Special Requests NONE  Final   Gram Stain   Final    RARE WBC PRESENT,BOTH PMN AND MONONUCLEAR NO ORGANISMS SEEN Performed at [facility], [place] [address] [place], [place] [address]    Report Status  05/14/2016 FINAL  Final  Body fluid culture     Status: None (Preliminary result)   Collection Time: 05/16/16  1:30 PM  Result Value Ref Range Status   Specimen Description Pleural R  Final   Special Requests NONE  Final   Gram Stain   Final    RARE WBC PRESENT, PREDOMINANTLY MONONUCLEAR NO ORGANISMS SEEN    Culture   Final    NO GROWTH 3 DAYS Performed at [facility], [place] [address], [place],  [address]    Report Status PENDING  Incomplete         Radiology Studies: Ct Head Wo Contrast  Result Date: 05/18/2016 CLINICAL DATA:  Increased lethargy, altered mental status EXAM: CT HEAD WITHOUT CONTRAST TECHNIQUE: Contiguous axial images were obtained from the base of the skull through the vertex without intravenous contrast. COMPARISON:  03/21/2015 FINDINGS: Brain: No evidence of acute infarction, hemorrhage, extra-axial collection, ventriculomegaly, or mass effect. Generalized cerebral atrophy. Periventricular white matter low attenuation likely secondary to microangiopathy. Vascular: Cerebrovascular atherosclerotic calcifications are noted. Skull: Negative for fracture or focal lesion. Sinuses/Orbits: Visualized portions of the orbits are unremarkable. Visualized portions of the paranasal sinuses and mastoid air cells are unremarkable. Other: None. IMPRESSION: 1. No acute intracranial pathology. 2. Chronic microvascular disease and cerebral atrophy. Electronically Signed   By: Kathreen Devoid   On: 05/18/2016 12:13   Ct Chest Wo Contrast  Result Date: 05/18/2016 CLINICAL DATA:  RIGHT effusion.  Thoracentesis 05/16/2016 EXAM: CT CHEST WITHOUT CONTRAST TECHNIQUE: Multidetector CT imaging of the chest was performed following the standard protocol without IV contrast. COMPARISON:  CT 05/13/2016 FINDINGS: Cardiovascular: Coronary artery calcification and aortic atherosclerotic calcification. Mediastinum/Nodes: No axillary or supraclavicular adenopathy. Pacemaker in the LEFT chest  wall. No mediastinal hilar adenopathy. No pericardial fluid. Esophagus normal. Lungs/Pleura: There is a small moderate this anterior RIGHT pneumothorax. The estimated volume is approximately 5 and 10%. RIGHT pleural effusion remaining is decreased in volume from comparison CT. There is however a residual mass within the  RIGHT lower lobe measuring 10 .1 x 9.8 cm (image 101, series 2) and this occupies the entirety of the RIGHT lower lobe. LEFT lung is clear Upper Abdomen: No dilated loops of large or small bowel. No pathologic calcifications. No organomegaly. No aggressive osseous lesion. No intraperitoneal free air. Musculoskeletal: Lucent lesion in the T3 vertebral body is well circumscribed in the represent hemangioma. IMPRESSION: 1. Small anterior RIGHT pneumothorax following thoracentesis 05/13/2016. 2. Decrease in pleural effusion following thoracentesis. 3. Persistent RIGHT lower lobe mass occupying the near entirety of the RIGHT lower lobe measuring up to 10 cm. Differential would include rounded pneumonia versus carcinoma. Recommend contrast CT or FDG PET scan or bronchoscopy for further evaluation to exclude LUNG CARCINOMA. Critical Value/emergent results were called by telephone at the time of interpretation on 05/18/2016 at 12:28 pm to nurse KIM, who verbally acknowledged these results. Electronically Signed   By: Suzy Bouchard M.D.   On: 05/18/2016 12:31   Dg Chest Port 1 View  Result Date: 05/17/2016 CLINICAL DATA:  Hypoxia and right sided pleural effusion. H/o CAD, ischemic cardiomyopathy, myocardial infarction, COPD, type 2 diabetes. Surgical h/o CABG in 2008 and cardiac defibrillator placement in 2013. Former smoker. EXAM: PORTABLE CHEST - 1 VIEW COMPARISON:  05/16/2016 FINDINGS: Stable left subclavian AICD. Previous median sternotomy and CABG. Low lung volumes with crowding of bibasilar bronchovascular structures. Heart size upper limits normal for technique.  Atheromatous aorta. Blunting of  bilateral lateral costophrenic angles right worse than left ; can't exclude small pleural effusions. IMPRESSION: 1. Low volumes with bibasilar crowding, possible small effusions. Electronically Signed   By: Lucrezia Europe M.D.   On: 05/17/2016 12:44        Scheduled Meds: . acetaminophen  1,000 mg Oral Daily  . amLODipine  5 mg Oral QHS  . azithromycin  500 mg Oral Q24H  . carvedilol  12.5 mg Oral BID WC  . cefTRIAXone (ROCEPHIN)  IV  1 g Intravenous Q24H  . insulin aspart  0-5 Units Subcutaneous QHS  . insulin aspart  0-9 Units Subcutaneous TID WC  . insulin aspart  5 Units Subcutaneous TID WC  . insulin detemir  5 Units Subcutaneous QHS  . polyethylene glycol  17 g Oral Q4H while awake  . rosuvastatin  40 mg Oral Daily   Continuous Infusions:    LOS: 6 days     Cordelia Poche [facility] 05/19/2016, 10:36 AM Pager: [phone]  If 7PM-7AM, please contact night-coverage [email or web address] Password [facility] 05/19/2016, 10:36 AM

## 2016-05-20 ENCOUNTER — Inpatient Hospital Stay (HOSPITAL_COMMUNITY)
Admit: 2016-05-20 | Discharge: 2016-05-20 | Disposition: A | Discharge status: Home / Self Care | Payer: PPO | Attending: Family Medicine | Admitting: Family Medicine

## 2016-05-20 ENCOUNTER — Inpatient Hospital Stay (HOSPITAL_COMMUNITY): Payer: PPO

## 2016-05-20 DIAGNOSIS — R4182 Altered mental status, unspecified: Secondary | ICD-10-CM

## 2016-05-20 DIAGNOSIS — N50819 Testicular pain, unspecified: Secondary | ICD-10-CM

## 2016-05-20 LAB — CBC
HCT: 29.6 % — ABNORMAL LOW (ref 39.0–52.0)
Hemoglobin: 9.2 g/dL — ABNORMAL LOW (ref 13.0–17.0)
MCH: 27.5 pg (ref 26.0–34.0)
MCHC: 31.1 g/dL (ref 30.0–36.0)
MCV: 88.6 fL (ref 78.0–100.0)
Platelets: 275 10*3/uL (ref 150–400)
RBC: 3.34 MIL/uL — ABNORMAL LOW (ref 4.22–5.81)
RDW: 14.9 % (ref 11.5–15.5)
WBC: 8 10*3/uL (ref 4.0–10.5)

## 2016-05-20 LAB — BASIC METABOLIC PANEL
Anion gap: 7 (ref 5–15)
BUN: 27 mg/dL — AB (ref 6–20)
CALCIUM: 9.6 mg/dL (ref 8.9–10.3)
CO2: 31 mmol/L (ref 22–32)
Chloride: 100 mmol/L — ABNORMAL LOW (ref 101–111)
Creatinine, Ser: 2.17 mg/dL — ABNORMAL HIGH (ref 0.61–1.24)
GFR calc Af Amer: 32 mL/min — ABNORMAL LOW (ref >60)
GFR, EST NON AFRICAN AMERICAN: 27 mL/min — AB (ref >60)
Glucose, Bld: 161 mg/dL — ABNORMAL HIGH (ref 65–99)
Potassium: 4.5 mmol/L (ref 3.5–5.1)
Sodium: 138 mmol/L (ref 135–145)

## 2016-05-20 LAB — BODY FLUID CULTURE: CULTURE: NO GROWTH

## 2016-05-20 LAB — TROPONIN I: Troponin I: 0.03 ng/mL (ref <0.03)

## 2016-05-20 LAB — GLUCOSE, CAPILLARY
GLUCOSE-CAPILLARY: 194 mg/dL — AB (ref 65–99)
GLUCOSE-CAPILLARY: 264 mg/dL — AB (ref 65–99)
Glucose-Capillary: 192 mg/dL — ABNORMAL HIGH (ref 65–99)
Glucose-Capillary: 327 mg/dL — ABNORMAL HIGH (ref 65–99)

## 2016-05-20 NOTE — Progress Notes (Signed)
Patient continues to decline nocturnal CPAP. Wife agrees, he rests better without the mask. However, she does not wish for the machine to be removed from the room at this time. Therefore, per her request, equipment remains at bedside. Order changed to prn per RT protocol.

## 2016-05-20 NOTE — Progress Notes (Signed)
EEG completed; results pending.    

## 2016-05-20 NOTE — Progress Notes (Signed)
Modified Barium Swallow Progress Note  Patient Details  Name: [name] [name] MRN: [id number] Date of Birth: [birth year]  Today's Date: 05/20/2016  Modified Barium Swallow completed.  Full report located under Chart Review in the Imaging Section.  Brief recommendations include the following:  Clinical Impression  Pt presents with a mild dysphagia with intermittent, trace aspiration of thin liquids.  Aspiration in small amounts did not elicit a cough response; pt was unaware that it had occurred.  There were oral delays, discoordination during the oral preparatory phase of the swallow; weak tongue propulsion with solid food residue in valleculae.  Risk of aspiration was heightened when pt consumed solids/liquids simultaneously.  Esophageal sweep revealed retention of barium in esophageal column.   For now, recommend continuing current diet with basic precautions to minimize aspiration; will provide pt with specific exercises to address deficits.  SLP will follow.    Swallow Evaluation Recommendations       SLP Diet Recommendations: Regular solids;Thin liquid   Liquid Administration via: Cup;Straw   Medication Administration: Whole meds with puree   Supervision: Patient able to self feed   Compensations: Slow rate;Small sips/bites   Postural Changes: Seated upright at 90 degrees   Oral Care Recommendations: Oral care BID        [name] 05/20/2016,3:25 PM

## 2016-05-20 NOTE — Procedures (Signed)
ELECTROENCEPHALOGRAM REPORT  Date of Study: 05/20/2016  Patient's Name: [name] [name] MRN: [id number] Date of Birth: 02/01/1935  Referring Provider: Mariel Aloe, MD  Clinical History: 82 year old male with CAD, CKD, diabetes and cardiomyopathy with altered mental status.  Medications: acetaminophen (TYLENOL) tablet 1,000 mg  amLODipine (NORVASC) tablet 5 mg  azithromycin (ZITHROMAX) tablet 500 mg  carvedilol (COREG) tablet 12.5 mg  cefTRIAXone (ROCEPHIN) 1 g in dextrose 5 % 50 mL IVPB  diazepam (VALIUM) tablet 5 mg  insulin aspart (novoLOG) injection insulin detemir (LEVEMIR) injection 5 Units  polyethylene glycol (MIRALAX / GLYCOLAX) packet 17 grosuvastatin (CRESTOR) tablet 40 mg   Technical Summary: A multichannel digital EEG recording measured by the international 10-20 system with electrodes applied with paste and impedances below 5000 ohms performed as portable with EKG monitoring in an awake and drowsy patient.  Hyperventilation and photic stimulation were not performed.  The digital EEG was referentially recorded, reformatted, and digitally filtered in a variety of bipolar and referential montages for optimal display.   Description: The patient is awake and drowsy during the recording.  During maximal wakefulness, there is a symmetric, medium voltage 6 Hz posterior dominant rhythm that attenuates with eye opening. This is admixed with diffuse 4-5 Hz theta and 2-3 Hz delta slowing of the waking background.  During drowsiness, there is an increase in theta slowing of the background.  Vertex waves and symmetric sleep spindles were not seen.  There were no epileptiform discharges or electrographic seizures seen.    EKG lead was unremarkable.  Impression: This awake and drowsy EEG is abnormal due to diffuse slowing of the waking background.  Clinical Correlation of the above findings indicates diffuse cerebral dysfunction that is non-specific in etiology and can be seen with  hypoxic/ischemic injury, toxic/metabolic encephalopathies, neurodegenerative disorders, or medication effect.  The absence of epileptiform discharges does not rule out a clinical diagnosis of epilepsy.  Clinical correlation is advised.   Metta Clines, DO

## 2016-05-20 NOTE — Progress Notes (Signed)
PROGRESS NOTE    [name] [name]  FHL:[id number] DOB: 1935/03/18 DOA: 05/13/2016 PCP: Elsie Stain, MD   Brief Narrative: [name] [name] is a 82 y.o. male with medical history significant of past medical history of coronary artery disease (ischemic cardiomyopathy) status post CABG with a last EF in 2017 that showed an EF of 35%, and a kidney disease stage III, insulin-dependent diabetes mellitus anemia of chronic diseases   Assessment & Plan:   Active Problems:   Chronic systolic CHF (congestive heart failure) (HCC)   Stage 3 chronic renal impairment associated with type 2 diabetes mellitus (HCC)   Anemia, iron deficiency   Pleural effusion on right   CAP (community acquired pneumonia)   Microcytic anemia   Melena   Fecal impaction (HCC)   Acute respiratory failure with hypoxia (HCC)   AKI (acute kidney injury) ([place])   CKD (chronic kidney disease), stage III   Right lower lobe lung mass   Postprocedural pneumothorax   Pleural effusion, right   Lung consolidation (HCC)   Aspiration pneumonia of right lower lobe (HCC)   Acute respiratory failure ?Community acquired pneumonia Right pleural effusion S/p thoracentesis with 2L removed. Fluid appears exudative. No bacteria. No cancer seen on cytology. Repeat thoracentesis with 1.8L removed. Repeat chest x-ray significant for small amount of effusion. Repeat CT scan significant for mass concerning for malignancy. Pulmonology leaning more towards aspiration than mass. Body fluid culture negative. Cytology x2 negative for malignant cells. ?aspiration componient -continue Ceftriaxone and azithromycin (day #7/8) -Speech evaluation recommendations -Pulmonology recommendations  Somnolence Mental status changes. Very difficult to gauge. No focal deficits. Patient is generally responsive to all questions but has episodes of possible confusion. No history of seizures. No fevers. Refused CPAP overnight. Waxing and waning. -neurology consult ->  recommend MRI (cannot perform) and EEG -Follow-up EEG -Will obtain troponin, echo and EKG  Right hydropneumothorax Small pneumothorax seen on CT scan. Asymptomatic.  Fecal impaction Constipation Resolved after enema. Moderate stool burden on KUB -continue Miralax  AKI on CKD stage III Has stabilized slightly above baseline. Baseline around 1.8 to 2.0  Microcytic anemia Melena No history of colonoscopy. CT scan not significant for obvious masses. GI consulted -GI recommendations: not planning colonoscopy until lung issues resolved  Diabetes mellitus type 2 -continue Levemir -continue SSI  S/p CABG No chest pain.  Chronic systolic CHF Euvolemic. AICD in place. -continue to hold aldactone and Demadex in setting of AKI  Urinary retension Initially thought to be secondary to impaction. Failed voiding trial and foley replaced. No medications that could be contributing. -continue foley -flomax -follow-up with urology as an outpatient  Scrotal pain Ultrasound unremarkable. Symptoms improved from yesterday.   DVT prophylaxis: SCDs Code Status: Full code Family Communication: None at bedside Disposition Plan: Discharge pending continued workup   Consultants:   Gastroenterology  Pulmonology  Procedures:   None  Antimicrobials:   Ceftriaxone (2/6>>  Azithromycin (2/6>>   Subjective: No chest pain or dyspnea today. Asking about discharge.  Objective: Vitals:   05/19/16 1831 05/19/16 2117 05/20/16 0438 05/20/16 0825  BP: (!) 109/55 137/86 (!) 109/51 115/80  Pulse: 68 88 89 87  Resp:  '18 16 18  '$ Temp:  98.6 F (37 C) 97.9 F (36.6 C)   TempSrc:  Axillary Axillary   SpO2:  98% 96% 95%  Weight:      Height:        Intake/Output Summary (Last 24 hours) at 05/20/16 1358 Last data filed at 05/20/16 1200  Gross per  24 hour  Intake             1130 ml  Output             1200 ml  Net              -70 ml   Filed Weights   05/13/16 1908  Weight: 108.7  kg (239 lb 11.2 oz)    Examination:  General exam: Appears calm and comfortable. Somnolent Respiratory system: Clear breath sounds, slightly diminished at bases Cardiovascular system: S1 & S2 heard, Normal rate with regular rhythm. Gastrointestinal system: Abdomen is nondistended, soft and nontender. Normal bowel sounds heard. Central nervous system: Alert and oriented. No focal neurological deficits. CN intact except CN VIII impairment. Extremities: No edema. No calf tenderness Skin: No cyanosis. No rashes Psychiatry: Judgement and insight appear normal. Mood & affect appropriate.    Data Reviewed: I have personally reviewed following labs and imaging studies  CBC:  Recent Labs Lab 05/15/16 1135 05/17/16 1031 05/18/16 0400 05/20/16 1017  WBC 8.7 8.1 9.9 8.0  HGB 9.3* 9.3* 9.2* 9.2*  HCT 30.1* 30.3* 29.8* 29.6*  MCV 90.4 89.6 88.4 88.6  PLT 249 254 245 269   Basic Metabolic Panel:  Recent Labs Lab 05/13/16 2040  05/15/16 1135 05/16/16 0652 05/17/16 0412 05/18/16 0400 05/20/16 1017  NA  --   < > 135 137 138 136 138  K  --   < > 4.6 4.4 4.5 4.8 4.5  CL  --   < > 100* 100* 101 98* 100*  CO2  --   < > '28 29 30 31 31  '$ GLUCOSE  --   < > 272* 152* 141* 174* 161*  BUN  --   < > 26* 23* 23* 25* 27*  CREATININE  --   < > 2.32* 2.17* 2.39* 2.36* 2.17*  CALCIUM  --   < > 8.6* 9.1 9.1 9.2 9.6  MG 2.1  --   --   --   --   --   --   < > = values in this interval not displayed. GFR: Estimated Creatinine Clearance: 32.4 mL/min (by C-G formula based on SCr of 2.17 mg/dL (H)). Liver Function Tests:  Recent Labs Lab 05/13/16 1800 05/14/16 0433  AST 14* 13*  ALT 10* 12*  ALKPHOS 44 42  BILITOT 0.5 0.4  PROT 7.1 6.6  ALBUMIN 3.2* 3.1*   BNP (last 3 results)  Recent Labs  04/08/16 1529  PROBNP 511.0*   HbA1C: No results for input(s): HGBA1C in the last 72 hours. CBG:  Recent Labs Lab 05/19/16 1156 05/19/16 1620 05/19/16 2037 05/20/16 0758 05/20/16 1215    GLUCAP 207* 163* 137* 192* 194*   Lipid Profile: No results for input(s): CHOL, HDL, LDLCALC, TRIG, CHOLHDL, LDLDIRECT in the last 72 hours. Thyroid Function Tests: No results for input(s): TSH, T4TOTAL, FREET4, T3FREE, THYROIDAB in the last 72 hours. Anemia Panel: No results for input(s): VITAMINB12, FOLATE, FERRITIN, TIBC, IRON, RETICCTPCT in the last 72 hours. Sepsis Labs: No results for input(s): PROCALCITON, LATICACIDVEN in the last 168 hours.  Recent Results (from the past 240 hour(s))  Urine culture     Status: None   Collection Time: 05/13/16  1:16 PM  Result Value Ref Range Status   Specimen Description URINE, CLEAN CATCH  Final   Special Requests NONE  Final   Culture   Final    NO GROWTH Performed at [facility], [address], [place], [place] [address]    Report Status 05/15/2016 FINAL  Final  Blood culture (routine x 2)     Status: None   Collection Time: 05/13/16  6:00 PM  Result Value Ref Range Status   Specimen Description BLOOD LEFT HAND  Final   Special Requests BOTTLES DRAWN AEROBIC AND ANAEROBIC 5CC  Final   Culture   Final    NO GROWTH 5 DAYS Performed at [facility], [place] [address] [place], [place] [address]    Report Status 05/18/2016 FINAL  Final  Blood culture (routine x 2)     Status: None   Collection Time: 05/13/16  6:20 PM  Result Value Ref Range Status   Specimen Description BLOOD RIGHT HAND  Final   Special Requests IN PEDIATRIC BOTTLE 2CC  Final   Culture   Final    NO GROWTH 5 DAYS Performed at [facility], [place] [address], [place], [place] [address]    Report Status 05/18/2016 FINAL  Final  Culture, body fluid-bottle     Status: None   Collection Time: 05/14/16 11:29 AM  Result Value Ref Range Status   Specimen Description PLEURAL RIGHT  Final   Special Requests NONE  Final   Culture   Final    NO GROWTH 5 DAYS Performed at [place] [address], [place], [place] [address]    Report Status  05/19/2016 FINAL  Final  Gram stain     Status: None   Collection Time: 05/14/16 11:29 AM  Result Value Ref Range Status   Specimen Description PLEURAL RIGHT  Final   Special Requests NONE  Final   Gram Stain   Final    RARE WBC PRESENT,BOTH PMN AND MONONUCLEAR NO ORGANISMS SEEN Performed at [facility], [place] [address], [place], [place] [address]    Report Status 05/14/2016 FINAL  Final  Body fluid culture     Status: None   Collection Time: 05/16/16  1:30 PM  Result Value Ref Range Status   Specimen Description Pleural R  Final   Special Requests NONE  Final   Gram Stain   Final    RARE WBC PRESENT, PREDOMINANTLY MONONUCLEAR NO ORGANISMS SEEN    Culture   Final    NO GROWTH 3 DAYS Performed at [facility], [place] [address], [place],  [address]    Report Status 05/20/2016 FINAL  Final         Radiology Studies: US Scrotum  Result Date: 05/19/2016 CLINICAL DATA:  Testicular pain on palpation.  No swelling. EXAM: SCROTAL ULTRASOUND DOPPLER ULTRASOUND OF THE TESTICLES TECHNIQUE: Complete ultrasound examination of the testicles, epididymis, and other scrotal structures was performed. Color and spectral Doppler ultrasound were also utilized to evaluate blood flow to the testicles. COMPARISON:  None. FINDINGS: Right testicle Measurements: 4.3 x 2.4 x 2.7 cm. No mass or microlithiasis visualized. Left testicle Measurements: 3.5 x 2.1 x 1.6 cm. No mass or microlithiasis visualized. Right epididymis:  Normal in size and appearance. Left epididymis:  Normal in size and appearance. Hydrocele:  None visualized. Varicocele:  None visualized. Pulsed Doppler interrogation of both testes demonstrates normal low resistance arterial and venous waveforms bilaterally. IMPRESSION: Normal testicular ultrasound Electronically Signed   By: Nolon Nations M.D.   On: 05/19/2016 17:11   [place] Art/ven Flow Abd Pelv Doppler  Result Date: 05/19/2016 CLINICAL DATA:  Testicular pain on  palpation.  No swelling. EXAM: SCROTAL ULTRASOUND DOPPLER ULTRASOUND  OF THE TESTICLES TECHNIQUE: Complete ultrasound examination of the testicles, epididymis, and other scrotal structures was performed. Color and spectral Doppler ultrasound were also utilized to evaluate blood flow to the testicles. COMPARISON:  None. FINDINGS: Right testicle Measurements: 4.3 x 2.4 x 2.7 cm. No mass or microlithiasis visualized. Left testicle Measurements: 3.5 x 2.1 x 1.6 cm. No mass or microlithiasis visualized. Right epididymis:  Normal in size and appearance. Left epididymis:  Normal in size and appearance. Hydrocele:  None visualized. Varicocele:  None visualized. Pulsed Doppler interrogation of both testes demonstrates normal low resistance arterial and venous waveforms bilaterally. IMPRESSION: Normal testicular ultrasound Electronically Signed   By: Nolon Nations M.D.   On: 05/19/2016 17:11   Dg Chest Port 1 View  Result Date: 05/19/2016 CLINICAL DATA:  Follow-up right pneumothorax. EXAM: PORTABLE CHEST 1 VIEW COMPARISON:  CT chest 05/18/2016 FINDINGS: Persistent right lower lobe airspace opacity. Small right pleural effusion. Mild left basilar atelectasis. No pneumothorax. Stable cardiomegaly. Prior CABG. Three lead cardiac pacemaker. IMPRESSION: No right pneumothorax. Persistent right lower lobe airspace opacity concerning for a mass. Electronically Signed   By: Kathreen Devoid   On: 05/19/2016 10:53        Scheduled Meds: . acetaminophen  1,000 mg Oral Daily  . amLODipine  5 mg Oral QHS  . azithromycin  500 mg Oral Q24H  . carvedilol  12.5 mg Oral BID WC  . cefTRIAXone (ROCEPHIN)  IV  1 g Intravenous Q24H  . insulin aspart  0-5 Units Subcutaneous QHS  . insulin aspart  0-9 Units Subcutaneous TID WC  . insulin aspart  5 Units Subcutaneous TID WC  . insulin detemir  5 Units Subcutaneous QHS  . polyethylene glycol  17 g Oral BID  . rosuvastatin  40 mg Oral Daily   Continuous Infusions:    LOS: 7  days     Cordelia Poche [facility] 05/20/2016, 1:58 PM Pager: [phone]  If 7PM-7AM, please contact night-coverage [email or web address] Password [email or web address] 05/20/2016, 1:58 PM

## 2016-05-20 NOTE — Progress Notes (Signed)
CRITICAL VALUE ALERT  Critical value received:  Troponin 0.03  Date of notification:  05/20/2016  Time of notification:  3612  Critical value read back:Yes.    Nurse who received alert:  Marta Lamas RN  MD notified (1st page):  Dr. Lonny Prude  Time of first page:  90  MD notified (2nd page:  Time of second page:  Responding MD: Dr. Lonny Prude  Time MD responded: 1800

## 2016-05-20 NOTE — Progress Notes (Signed)
[facility] Attending Note  Presenting HPI:  82 y.o. male with history of tobacco use. Patient with right pleural effusion status post thoracentesis 2 during this admission. Presenting complaint cough with intermittent fever and weight loss for approximately 3 months. Patient has also had known coughing/dysphagia with meals.  Subjective:  Events overnight. Patient more awake today. Patient denies any dyspnea or chest discomfort. Patient still somewhat confused. Currently having EEG placed.  Review of Systems:  Unable to obtain an accurate review of systems due to altered mentation.   Temp:  [97.5 F (36.4 C)-98.6 F (37 C)] 97.9 F (36.6 C) (02/13 0438) Pulse Rate:  [68-89] 87 (02/13 0825) Resp:  [16-18] 18 (02/13 0825) BP: (81-137)/(51-86) 115/80 (02/13 0825) SpO2:  [95 %-98 %] 95 % (02/13 0825)  Gen.: Awake. Currently EEG tech replacing lesions on scalp. No acute distress. Wife again at bedside. Integument: Warm and dry. Without any rash or bruising on exposed skin. Pulmonary: Decreased breath sounds in right lung base. No accessory muscle use. Currently on nasal cannula oxygen. Cardiovascular: Regular rate. No appreciated JVD or edema. Abdomen: Soft. Protuberant. Grossly nontender. Neurological: Cranial nerves grossly intact. Patient is following commands. He is answering questions but still appears somewhat confused. HEENT: Moist mucous membranes. No scleral icterus. Lymphatics: No appreciated cervical or supraclavicular lymphadenopathy.  CBC Latest Ref Rng & Units 05/18/2016 05/17/2016 05/15/2016  WBC 4.0 - 10.5 K/uL 9.9 8.1 8.7  Hemoglobin 13.0 - 17.0 g/dL 9.2(L) 9.3(L) 9.3(L)  Hematocrit 39.0 - 52.0 % 29.8(L) 30.3(L) 30.1(L)  Platelets 150 - 400 K/uL 245 254 249    BMP Latest Ref Rng & Units 05/18/2016 05/17/2016 05/16/2016  Glucose 65 - 99 mg/dL 174(H) 141(H) 152(H)  BUN 6 - 20 mg/dL 25(H) 23(H) 23(H)  Creatinine 0.61 - 1.24 mg/dL 2.36(H) 2.39(H) 2.17(H)   Sodium 135 - 145 mmol/L 136 138 137  Potassium 3.5 - 5.1 mmol/L 4.8 4.5 4.4  Chloride 101 - 111 mmol/L 98(L) 101 100(L)  CO2 22 - 32 mmol/L '31 30 29  '$ Calcium 8.9 - 10.3 mg/dL 9.2 9.1 9.1    IMAGING/STUDIES: Right Pleural Fluid Cytology 05/14/16:  No malignant cells  Right Pleural Fluid Cytology 05/16/16: No malignant cells  CT CHEST W/O 05/18/16:  Previously reviewed by me. Persistent right lower lobe opacity. Right-sided hydropneumothorax with some pleural thickening suspicious for trapped lung. No pathologic mediastinal adenopathy. No pericardial effusion.  MICROBIOLOGY: Blood Cultures x2 2/6:  Negative Urine Culture 2/6:  Negative  Right Pleural Fluid Culture 2/7:  Negative Right Pleural Fluid Culture 2/9 >>> Urine Streptococcal Antigen 2/6:  Negative   ANTIBIOTICS: Azithromycin 2/6 >>> (2/14 stop date) Rocephin 2/6 >>> (2/14 stop date)  ASSESSMENT/PLAN:  82 y.o. male with what seems to be chronic aspiration. Pleural fluid cytology again negative. I would favor holding off on VATS and pleural biopsy pending results of speech therapy and neurology consultations. I would also favor holding off on bronchoscopy depending upon the results of a PET/CT scan which could help to further guide biopsy and appropriate staging of the patient. I do have a lower suspicion that he has lung cancer given the absence of pathologic mediastinal adenopathy. However, this is certainly of concern with his weight loss.   1. Concordant exudate right pleural effusion: Likely chronic. Holding off on VATS and pleural biopsy while awaiting culture result.  2. Right lower lobe consolidation versus mass: Awaiting results of Speech and Neurology Consults. Holding off on bronchoscopy. Would favor outpatient PET CT and  biopsy depending on this result.  3. Probable chronic aspiration: Speech therapy consult pending. 4. Probable aspiration pneumonia: Currently on Rocephin & Azithromycin with stop dates for 2/14. Awaiting  finalization of pleural fluid culture.   Remainder of care as per primary service.  I have spent a total of 37 minutes of time today caring for the patient, reviewing the patient's electronic medical record, and with more than 50% of that time spent coordinating care with the patient's transfer as well as reviewing the continuing plan of care with the patient & wife at bedside.  Sonia Baller Ashok Cordia, M.D. [facility] Pager:  [phone] After 3pm or if no response, call [phone] 10:34 AM 05/20/16

## 2016-05-20 NOTE — Evaluation (Signed)
Clinical/Bedside Swallow Evaluation Patient Details  Name: [name] [name] MRN: [id number] Date of Birth: [birth year]  Today's Date: 05/20/2016 Time: SLP Start Time (ACUTE ONLY): 0272 SLP Stop Time (ACUTE ONLY): 1401 SLP Time Calculation (min) (ACUTE ONLY): 12 min  Past Medical History:  Past Medical History:  Diagnosis Date  . Anxiety   . Arthritis    "hands" (01/06/2012)  . Boil    "fluid boil left arm" (01/30/2012)  . CAD (coronary artery disease)    bypass graft surgery 05/2006; grafts were widely patent on cath 06/2010   . Chronic kidney disease   . COPD (chronic obstructive pulmonary disease) ([place]) 1993  . 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 Diabetes mellitus, type 2 ([place]) 2000  . Elbow mass    left; "just noticed it ~ 2 wk ago"  (01/06/2012)  . Foot fracture 1953; 1962   left; left  . Hyperlipidemia 1993  . Hypertension 1993  . Ischemic cardiomyopathy    EF 25-30%; s/p single chamber ICD 2010; upgrade to Med[employer]onic BiV ICD 01/30/12  . LBBB (left bundle branch block)   . Myocardial infarction 2008; 01/06/2012  . Nightmares    with psych eval 2012 at [facility]  . PTSD (post-traumatic stress disorder)   . Shortness of breath 01/05/2012   "all the time; been going on for awhile"  . Syncope and collapse 01/05/2012  . Systolic heart failure    class II  . Tinnitus of left ear    Past Surgical History:  Past Surgical History:  Procedure Laterality Date  . APPENDECTOMY  1957  . BI-VENTRICULAR IMPLANTABLE CARDIOVERTER DEFIBRILLATOR UPGRADE N/A 01/30/2012   Procedure: BI-VENTRICULAR IMPLANTABLE CARDIOVERTER DEFIBRILLATOR UPGRADE;  Surgeon: Evans Lance, MD;  Location: [facility] CATH LAB;  Service: Cardiovascular;  Laterality: N/A;  . CARDIAC DEFIBRILLATOR PLACEMENT  01/30/2012   biventricular ICD  . CARDIAC DEFIBRILLATOR PLACEMENT  01/30/2012   biventricular ICD  . CORONARY ARTERY BYPASS GRAFT  2008   3 vessel, class 4 CHF, non Q wave MI 05/19/06  . [place]; ~ 1962   left; left  .  INSERT / REPLACE / REMOVE PACEMAKER  01/30/2012   biventricular ICD placed   HPI:  82 y.o.malewith medical history significant of past medical history of coronary artery disease (ischemic cardiomyopathy) status post CABG with last EF in 2017 of 35%, kidney diseasestage III,IDDM, admitted with pleural effusion, R pneumothorax s/p thoracentesis x2 this admission, fecal impaction. Weight loss. His wife reports constant coughing with meals. Persistent RLL opacity, potential chronic aspiration per PCCM, hence swallow evaluation ordered.    Assessment / Plan / Recommendation Clinical Impression  Pt presents with subtle signs of dysphagia - oropharyngeal vs esophageal - with multiple subswallows per bolus, intermittent belching and regurgitation.  His wife is at bedside and asserts coughing during most meals.  Recommend proceeding with MBS to determine presence of dysphagia, aspiration.     Aspiration Risk       Diet Recommendation   continue current diet pending MBS  Medication Administration: Whole meds with liquid    Other  Recommendations     Follow up Recommendations    mbs    Frequency and Duration            Prognosis        Swallow Study   General HPI: 82 y.o.malewith medical history significant of past medical history of coronary artery disease (ischemic cardiomyopathy) status post CABG with last EF in 2017 of 35%, kidney diseasestage III,IDDM, admitted  with pleural effusion, R pneumothorax s/p thoracentesis x2 this admission, fecal impaction. Weight loss. His wife reports constant coughing with meals. Persistent RLL opacity, potential chronic aspiration per PCCM, hence swallow evaluation ordered.  Type of Study: Bedside Swallow Evaluation Previous Swallow Assessment: no Diet Prior to this Study: Regular;Thin liquids Temperature Spikes Noted: No Respiratory Status: Nasal cannula History of Recent Intubation: No Behavior/Cognition: Alert;Cooperative Oral Cavity  Assessment: Within Functional Limits Oral Care Completed by SLP: No Oral Cavity - Dentition: Dentures, top;Dentures, bottom Vision: Functional for self-feeding Self-Feeding Abilities: Able to feed self Patient Positioning: Upright in chair Baseline Vocal Quality: Normal Volitional Cough: Strong Volitional Swallow: Able to elicit    Oral/Motor/Sensory Function Overall Oral Motor/Sensory Function: Within functional limits   Ice Chips Ice chips: Within functional limits   Thin Liquid Thin Liquid: Impaired Presentation: Straw Pharyngeal  Phase Impairments: Multiple swallows;Throat Clearing - Immediate    Nectar Thick Nectar Thick Liquid: Not tested   Honey Thick Honey Thick Liquid: Not tested   Puree Puree: Within functional limits   Solid   GO   Solid: Not tested        [name] [name] 05/20/2016,2:02 PM

## 2016-05-20 NOTE — Progress Notes (Signed)
CSW assisting with d/c planning. Met with pt / spouse at bedside to provide SNF bed offers and support. CSW will continue to follow to assist with d/c planning needs.  Rayshawn Lean LCSW [phone]

## 2016-05-20 NOTE — Progress Notes (Signed)
Patient resting during the shift with spouse at bedside, no complaints of pain, alertness increasing, foley in place, BM this am, uneventual night will continue to monitor.

## 2016-05-20 NOTE — Consult Note (Signed)
   [facility] CM Inpatient Consult   05/20/2016  [name] [name] [birth year] [id number]    Patient screened for potential [facility] Care Management services. Chart reviewed. Noted current discharge plan is for SNF.  There are no identifiable [facility] Care Management needs at this time. If patient's post hospital needs change, please place a [facility] Care Management consult. For questions please contact:  Marthenia Rolling, [place], RN,BSN [facility] Liaison [phone]

## 2016-05-21 ENCOUNTER — Inpatient Hospital Stay (HOSPITAL_COMMUNITY): Payer: PPO

## 2016-05-21 DIAGNOSIS — K5641 Fecal impaction: Secondary | ICD-10-CM

## 2016-05-21 DIAGNOSIS — I509 Heart failure, unspecified: Secondary | ICD-10-CM

## 2016-05-21 DIAGNOSIS — J9601 Acute respiratory failure with hypoxia: Secondary | ICD-10-CM

## 2016-05-21 DIAGNOSIS — N179 Acute kidney failure, unspecified: Secondary | ICD-10-CM

## 2016-05-21 LAB — GLUCOSE, CAPILLARY
GLUCOSE-CAPILLARY: 186 mg/dL — AB (ref 65–99)
Glucose-Capillary: 141 mg/dL — ABNORMAL HIGH (ref 65–99)
Glucose-Capillary: 197 mg/dL — ABNORMAL HIGH (ref 65–99)
Glucose-Capillary: 107 mg/dL — ABNORMAL HIGH (ref 65–99)

## 2016-05-21 LAB — ECHOCARDIOGRAM COMPLETE
Height: 67 in
Weight: 3835.2 oz

## 2016-05-21 MED ORDER — PERFLUTREN LIPID MICROSPHERE
1.0000 mL | INTRAVENOUS | Status: AC | PRN
  Administered 2016-05-21: 2 mL via INTRAVENOUS
  Filled 2016-05-21: qty 10

## 2016-05-21 MED ORDER — PERFLUTREN LIPID MICROSPHERE
INTRAVENOUS | Status: AC
  Filled 2016-05-21: qty 10

## 2016-05-21 NOTE — Progress Notes (Signed)
   05/21/16 1000  Clinical Encounter Type  Visited With Health care provider  Visit Type Initial;Spiritual support;Psychological support  Referral From Chaplain  Consult/Referral To Chaplain  Spiritual Encounters  Spiritual Needs Ritual;Prayer  Stress Factors  Patient Stress Factors Not reviewed  Family Stress Factors Not reviewed   Patient requested a priest. Malena Catholic was contacted and a priest will be coming to visit with the patient today.  Please, contact Spiritual Care for further assistance.   [place] M.Div.

## 2016-05-21 NOTE — Progress Notes (Signed)
[facility] Attending Note  Presenting HPI:  82 y.o. male with history of tobacco use. Patient with  Rt pleural effusion status post thoracentesis 2 during this admission. Presenting complaint cough with intermittent fever and weight loss for approximately 3 months. Patient has also had known coughing/dysphagia with meals.  Subjective:         Temp:  [97.5 F (36.4 C)-99.2 F (37.3 C)] 98 F (36.7 C) (02/14 1459) Pulse Rate:  [69-92] 69 (02/14 1459) Resp:  [18] 18 (02/14 1459) BP: (94-140)/(53-67) 94/53 (02/14 1459) SpO2:  [91 %-99 %] 91 % (02/14 1459)  Gen.: Awake.  No acute distress. Wife at bedside. Integument: Warm and dry. Without any rash or bruising on exposed skin. Pulmonary: Decreased breath sounds in right lung base. No accessory muscle use. Currently on nasal cannula oxygen. Cardiovascular: Regular rate. No appreciated JVD or edema. Abdomen: Soft. Protuberant. Grossly nontender. Neurological: Cranial nerves grossly intact. Patient is following commands. He is answering questions but still appears somewhat confused. HEENT: Moist mucous membranes. No scleral icterus. Lymphatics: No appreciated cervical or supraclavicular lymphadenopathy.  CBC Latest Ref Rng & Units 05/20/2016 05/18/2016 05/17/2016  WBC 4.0 - 10.5 K/uL 8.0 9.9 8.1  Hemoglobin 13.0 - 17.0 g/dL 9.2(L) 9.2(L) 9.3(L)  Hematocrit 39.0 - 52.0 % 29.6(L) 29.8(L) 30.3(L)  Platelets 150 - 400 K/uL 275 245 254    BMP Latest Ref Rng & Units 05/20/2016 05/18/2016 05/17/2016  Glucose 65 - 99 mg/dL 161(H) 174(H) 141(H)  BUN 6 - 20 mg/dL 27(H) 25(H) 23(H)  Creatinine 0.61 - 1.24 mg/dL 2.17(H) 2.36(H) 2.39(H)  Sodium 135 - 145 mmol/L 138 136 138  Potassium 3.5 - 5.1 mmol/L 4.5 4.8 4.5  Chloride 101 - 111 mmol/L 100(L) 98(L) 101  CO2 22 - 32 mmol/L '31 31 30  '$ Calcium 8.9 - 10.3 mg/dL 9.6 9.2 9.1    IMAGING/STUDIES: Right Pleural Fluid Cytology 05/14/16:  No malignant cells  Right Pleural Fluid  Cytology 05/16/16: No malignant cells  CT CHEST W/O 05/18/16:   reviewed by me. Persistent right lower lobe opacity. Right-sided hydropneumothorax with some pleural thickening suspicious for trapped lung. No pathologic mediastinal adenopathy. No pericardial effusion.  MICROBIOLOGY: Blood Cultures x2 2/6:  Negative Urine Culture 2/6:  Negative  Right Pleural Fluid Culture 2/7:  Negative Right Pleural Fluid Culture 2/9 >>> Urine Streptococcal Antigen 2/6:  Negative   ANTIBIOTICS: Azithromycin 2/6 >>> (2/14 stop date) Rocephin 2/6 >>> (2/14 stop date)  ASSESSMENT/PLAN:  81 y.o. male with what seems to be chronic aspiration. Pleural fluid cytology again negative. Differential diagnosis includes aspiration pneumonia with consolidated lung versus lung mass. I note right lower lobe consolidation and 03/2015 His mental status has improved and his wife is really keen that all procedures should be done in the hospital because it is difficult to mobilize him and also because he will not come back for procedure once he is discharged   1. Concordant exudate right pleural effusion. 2. Right lower lobe consolidation versus mass:  3. Probable chronic aspiration: on diet, cleared swallow 4. Probable aspiration pneumonia: Currently on Rocephin , dc Azithromycin    I reviewed images in detail with interventional radiology and discussed procedure in detail with wife The various options of biopsy including bronchoscopy, CT guided needle aspiration and surgical biopsy were discussed.The risks of each procedure including coughing, bleeding and the  chances of lung puncture requiring chest tube were discussed in great detail. The benefits & alternatives including serial follow up were also  discussed.   Kara Mead MD. [facility]. [facility] Pager [phone] If no response call [phone]     3:25 PM 05/21/16

## 2016-05-21 NOTE — Progress Notes (Signed)
[name] [name] came by for [facility] Wednesday and patient refused.

## 2016-05-21 NOTE — Progress Notes (Signed)
  Echocardiogram 2D Echocardiogram has been performed with definity.  Aggie Cosier 05/21/2016, 9:30 AM

## 2016-05-21 NOTE — Progress Notes (Signed)
PROGRESS NOTE    [name] [name]  JJH:[id number] DOB: 10/22/1934 DOA: 05/13/2016 PCP: Elsie Stain, MD    Brief Narrative:  82 yo male with ischemic cardiomyopathy, fraction 35% and chronic kidney disease stage III. Patient was found in hypoxic respiratory failure down to 88%. CT scan showed a large right pleural effusion. Patient was admitted with a working diagnosis of hypoxic respiratory failure due to community acquried pneumonia with a parapneumonic effusion. Patient was found to have a right base lung mass, had thoracentesis x2. Pulmonary consultation for possible tissue biopsy.   Assessment & Plan:   Active Problems:   Chronic systolic CHF (congestive heart failure) (HCC)   Stage 3 chronic renal impairment associated with type 2 diabetes mellitus (HCC)   Anemia, iron deficiency   Pleural effusion on right   CAP (community acquired pneumonia)   Microcytic anemia   Melena   Fecal impaction (HCC)   Acute respiratory failure with hypoxia (HCC)   AKI (acute kidney injury) ([place])   CKD (chronic kidney disease), stage III   Right lower lobe lung mass   Postprocedural pneumothorax   Pleural effusion, right   Lung consolidation ([place])   Aspiration pneumonia of right lower lobe ([place])  1. Community acquired pneumonia. Patient will complete antibiotic therapy today. Will continue oxymetry monitoring and supplemental oxygen per [place]. Target 02 sat above 02%. EEG with no active seizures and swallow evaluation with recommendations for regular solids with thin liquids.   2. Right pleural exudate effusion with hypoxic respiratory failure. Chest film and chest CT personally reviewed noted pleural effusion, right lower lobe mass and right pneumothorax. Patient sp thoracentesis x2, 2 liters then 1.8 liters. Oxygen saturation 92% on room air at rest.   3. Urinary retention. Will remove foley catheter in preparation for discharge, on admission presumed to have retention related to fecal impaction.    4. Fecal impaction. Will continue bowel regimen.    5. Systolic heart failure. Clinically euvolemic, will continue coreg and blood pressure control with amlodipine.   6. AKI on CKD.  Renal function with stable cr at 2.17, K at 4,5 and serum bicarbonate at 31. Will continue to avoid nephrotoxic medications. Base cr 1,5.   7. T2DM. Will continue glucose cover and monitoring with insulin sliding scale. Capillary glucose [phone]. Patient tolerating po well.   8. Right base lung mass. Will follow with pulmonary for further recommendations. Per patient's wife at the bedside, it will be challenging to have patient follow up as outpatient.   DVT prophylaxis: scd  Code Status: full  Family Communication: I spoke with patient's family at the bedside and all questions were addressed.  Disposition Plan: Home   Consultants:   Pulmonary   Procedures:     Antimicrobials:   Ceftriaxone  azithromycin   Subjective: Patient with improved dyspnea, no cough or wheezing. No nausea or vomiting, tolerating po well.   Objective: Vitals:   05/20/16 1700 05/20/16 2052 05/21/16 0457 05/21/16 0819  BP: (!) 109/57 (!) 140/58 (!) 111/55 107/67  Pulse: 78 78 92 83  Resp:  18 18   Temp: 97.5 F (36.4 C) 99.1 F (37.3 C) 99.2 F (37.3 C)   TempSrc: Axillary Axillary Oral   SpO2: 94% 99% 92%   Weight:      Height:        Intake/Output Summary (Last 24 hours) at 05/21/16 1321 Last data filed at 05/21/16 1016  Gross per 24 hour  Intake  960 ml  Output             1425 ml  Net             -465 ml   Filed Weights   05/13/16 1908  Weight: 108.7 kg (239 lb 11.2 oz)    Examination:  General exam: not in pain or dyspnea E ENT: No pallor or icterus, oral mucosa moist.  Respiratory system: Mild decreased breath sounds on the right base, no wheezing, rales or rhonchi. Respiratory effort normal. Cardiovascular system: S1 & S2 heard, RRR. No JVD, murmurs, rubs, gallops or  clicks. No pedal edema. Gastrointestinal system: Abdomen is nondistended, soft and nontender. No organomegaly or masses felt. Normal bowel sounds heard. Central nervous system: Alert and oriented. No focal neurological deficits. Slow to respond to questions, mild confusion.  Extremities: Symmetric 5 x 5 power. Skin: No rashes, lesions or ulcers  Data Reviewed: I have personally reviewed following labs and imaging studies  CBC:  Recent Labs Lab 05/15/16 1135 05/17/16 1031 05/18/16 0400 05/20/16 1017  WBC 8.7 8.1 9.9 8.0  HGB 9.3* 9.3* 9.2* 9.2*  HCT 30.1* 30.3* 29.8* 29.6*  MCV 90.4 89.6 88.4 88.6  PLT 249 254 245 010   Basic Metabolic Panel:  Recent Labs Lab 05/15/16 1135 05/16/16 0652 05/17/16 0412 05/18/16 0400 05/20/16 1017  NA 135 137 138 136 138  K 4.6 4.4 4.5 4.8 4.5  CL 100* 100* 101 98* 100*  CO2 '28 29 30 31 31  '$ GLUCOSE 272* 152* 141* 174* 161*  BUN 26* 23* 23* 25* 27*  CREATININE 2.32* 2.17* 2.39* 2.36* 2.17*  CALCIUM 8.6* 9.1 9.1 9.2 9.6   GFR: Estimated Creatinine Clearance: 32.4 mL/min (by C-G formula based on SCr of 2.17 mg/dL (H)). Liver Function Tests: No results for input(s): AST, ALT, ALKPHOS, BILITOT, PROT, ALBUMIN in the last 168 hours. No results for input(s): LIPASE, AMYLASE in the last 168 hours.  Recent Labs Lab 05/17/16 1629  AMMONIA 26   Coagulation Profile: No results for input(s): INR, PROTIME in the last 168 hours. Cardiac Enzymes:  Recent Labs Lab 05/20/16 1645  TROPONINI 0.03*   BNP (last 3 results)  Recent Labs  04/08/16 1529  PROBNP 511.0*   HbA1C: No results for input(s): HGBA1C in the last 72 hours. CBG:  Recent Labs Lab 05/20/16 1215 05/20/16 1736 05/20/16 2021 05/21/16 0748 05/21/16 1232  GLUCAP 194* 327* 264* 141* 197*   Lipid Profile: No results for input(s): CHOL, HDL, LDLCALC, TRIG, CHOLHDL, LDLDIRECT in the last 72 hours. Thyroid Function Tests: No results for input(s): TSH, T4TOTAL, FREET4,  T3FREE, THYROIDAB in the last 72 hours. Anemia Panel: No results for input(s): VITAMINB12, FOLATE, FERRITIN, TIBC, IRON, RETICCTPCT in the last 72 hours. Sepsis Labs: No results for input(s): PROCALCITON, LATICACIDVEN in the last 168 hours.  Recent Results (from the past 240 hour(s))  Urine culture     Status: None   Collection Time: 05/13/16  1:16 PM  Result Value Ref Range Status   Specimen Description URINE, CLEAN CATCH  Final   Special Requests NONE  Final   Culture   Final    NO GROWTH Performed at [facility], [address] [place], [place] [address]    Report Status 05/15/2016 FINAL  Final  Blood culture (routine x 2)     Status: None   Collection Time: 05/13/16  6:00 PM  Result Value Ref Range Status   Specimen Description BLOOD LEFT HAND  Final  Special Requests BOTTLES DRAWN AEROBIC AND ANAEROBIC 5CC  Final   Culture   Final    NO GROWTH 5 DAYS Performed at [facility], [place] [address] [place], [place] [address]    Report Status 05/18/2016 FINAL  Final  Blood culture (routine x 2)     Status: None   Collection Time: 05/13/16  6:20 PM  Result Value Ref Range Status   Specimen Description BLOOD RIGHT HAND  Final   Special Requests IN PEDIATRIC BOTTLE 2CC  Final   Culture   Final    NO GROWTH 5 DAYS Performed at [facility], [place] [address] [place], [place] [address]    Report Status 05/18/2016 FINAL  Final  Culture, body fluid-bottle     Status: None   Collection Time: 05/14/16 11:29 AM  Result Value Ref Range Status   Specimen Description PLEURAL RIGHT  Final   Special Requests NONE  Final   Culture   Final    NO GROWTH 5 DAYS Performed at [facility] [address] [place], [place] [address]    Report Status 05/19/2016 FINAL  Final  Gram stain     Status: None   Collection Time: 05/14/16 11:29 AM  Result Value Ref Range Status   Specimen Description PLEURAL RIGHT  Final   Special Requests NONE  Final   Gram Stain   Final     RARE WBC PRESENT,BOTH PMN AND MONONUCLEAR NO ORGANISMS SEEN Performed at [facility], [place] [address] [place], [place] [address]    Report Status 05/14/2016 FINAL  Final  Body fluid culture     Status: None   Collection Time: 05/16/16  1:30 PM  Result Value Ref Range Status   Specimen Description Pleural R  Final   Special Requests NONE  Final   Gram Stain   Final    RARE WBC PRESENT, PREDOMINANTLY MONONUCLEAR NO ORGANISMS SEEN    Culture   Final    NO GROWTH 3 DAYS Performed at [facility], [place] [address], [place], [place] [address]    Report Status 05/20/2016 FINAL  Final         Radiology Studies: US Scrotum  Result Date: 05/19/2016 CLINICAL DATA:  Testicular pain on palpation.  No swelling. EXAM: SCROTAL ULTRASOUND DOPPLER ULTRASOUND OF THE TESTICLES TECHNIQUE: Complete ultrasound examination of the testicles, epididymis, and other scrotal structures was performed. Color and spectral Doppler ultrasound were also utilized to evaluate blood flow to the testicles. COMPARISON:  None. FINDINGS: Right testicle Measurements: 4.3 x 2.4 x 2.7 cm. No mass or microlithiasis visualized. Left testicle Measurements: 3.5 x 2.1 x 1.6 cm. No mass or microlithiasis visualized. Right epididymis:  Normal in size and appearance. Left epididymis:  Normal in size and appearance. Hydrocele:  None visualized. Varicocele:  None visualized. Pulsed Doppler interrogation of both testes demonstrates normal low resistance arterial and venous waveforms bilaterally. IMPRESSION: Normal testicular ultrasound Electronically Signed   By: Nolon Nations M.D.   On: 05/19/2016 17:11   [place] Art/ven Flow Abd Pelv Doppler  Result Date: 05/19/2016 CLINICAL DATA:  Testicular pain on palpation.  No swelling. EXAM: SCROTAL ULTRASOUND DOPPLER ULTRASOUND OF THE TESTICLES TECHNIQUE: Complete ultrasound examination of the testicles, epididymis, and other scrotal structures was performed. Color and spectral  Doppler ultrasound were also utilized to evaluate blood flow to the testicles. COMPARISON:  None. FINDINGS: Right testicle Measurements: 4.3 x 2.4 x 2.7 cm. No mass or microlithiasis visualized.  Left testicle Measurements: 3.5 x 2.1 x 1.6 cm. No mass or microlithiasis visualized. Right epididymis:  Normal in size and appearance. Left epididymis:  Normal in size and appearance. Hydrocele:  None visualized. Varicocele:  None visualized. Pulsed Doppler interrogation of both testes demonstrates normal low resistance arterial and venous waveforms bilaterally. IMPRESSION: Normal testicular ultrasound Electronically Signed   By: Nolon Nations M.D.   On: 05/19/2016 17:11   Dg Swallowing Func-speech Pathology  Result Date: 05/20/2016 Objective Swallowing Evaluation: Type of Study: MBS-Modified Barium Swallow Study Patient Details Name: [name] [name] MRN: [id number] Date of Birth: 02/13/1935 Today's Date: 05/20/2016 Time: SLP Start Time (ACUTE ONLY): 1430-SLP Stop Time (ACUTE ONLY): 1500 SLP Time Calculation (min) (ACUTE ONLY): 30 min Past Medical History: Past Medical History: Diagnosis Date . Anxiety  . Arthritis   "hands" (01/06/2012) . Boil   "fluid boil left arm" (01/30/2012) . CAD (coronary artery disease)   bypass graft surgery 05/2006; grafts were widely patent on cath 06/2010  . Chronic kidney disease  . COPD (chronic obstructive pulmonary disease) ([place]) 1993 . Deafness in left ear  . Diabetes mellitus, type 2 ([place]) 2000 . Elbow mass   left; "just noticed it ~ 2 wk ago"  (01/06/2012) . Foot fracture 1953; 1962  left; left . Hyperlipidemia 1993 . Hypertension 1993 . Ischemic cardiomyopathy   EF 25-30%; s/p single chamber ICD 2010; upgrade to Med[employer]onic BiV ICD 01/30/12 . LBBB (left bundle branch block)  . Myocardial infarction 2008; 01/06/2012 . Nightmares   with psych eval 2012 at [facility] . PTSD (post-traumatic stress disorder)  . Shortness of breath 01/05/2012  "all the time; been going on for awhile" . Syncope and  collapse 01/05/2012 . Systolic heart failure   class II . Tinnitus of left ear  Past Surgical History: Past Surgical History: Procedure Laterality Date . APPENDECTOMY  1957 . BI-VENTRICULAR IMPLANTABLE CARDIOVERTER DEFIBRILLATOR UPGRADE N/A 01/30/2012  Procedure: BI-VENTRICULAR IMPLANTABLE CARDIOVERTER DEFIBRILLATOR UPGRADE;  Surgeon: Evans Lance, MD;  Location: [facility] CATH LAB;  Service: Cardiovascular;  Laterality: N/A; . CARDIAC DEFIBRILLATOR PLACEMENT  01/30/2012  biventricular ICD . CARDIAC DEFIBRILLATOR PLACEMENT  01/30/2012  biventricular ICD . CORONARY ARTERY BYPASS GRAFT  2008  3 vessel, class 4 CHF, non Q wave MI 05/19/06 . [place]; ~ 1962  left; left . INSERT / REPLACE / REMOVE PACEMAKER  01/30/2012  biventricular ICD placed HPI: 82 y.o.malewith medical history significant for coronary artery disease (ischemic cardiomyopathy) status post CABG with last EF in 2017 of 35%, kidney diseasestage III,IDDM, admitted with pleural effusion, R pneumothorax s/p thoracentesis x2 this admission, fecal impaction. Weight loss. His wife reports constant coughing with meals. Persistent RLL opacity, potential chronic aspiration per PCCM, hence swallow evaluation ordered.  Subjective: answers questions, wife present Assessment / Plan / Recommendation CHL IP CLINICAL IMPRESSIONS 05/20/2016 Therapy Diagnosis Mild oral phase dysphagia;Mild pharyngeal phase dysphagia Clinical Impression Pt presents with a mild dysphagia with intermittent, trace aspiration of thin liquids.  Aspiration in small amounts did not elicit a cough response; pt was unaware that it had occurred.  There were oral delays, discoordination during the oral preparatory phase of the swallow; weak tongue propulsion with solid food residue in valleculae.  Risk of aspiration was heightened when pt consumed solids/liquids simultaneously.  Esophageal sweep revealed retention of barium in esophageal column.   For now, recommend continuing current diet  with basic precautions to minimize aspiration; will provide pt with specific exercises to address deficits.  SLP will follow.  Impact  on safety and function Moderate aspiration risk   CHL IP TREATMENT RECOMMENDATION 05/20/2016 Treatment Recommendations Therapy as outlined in treatment plan below   No flowsheet data found. CHL IP DIET RECOMMENDATION 05/20/2016 SLP Diet Recommendations Regular solids;Thin liquid Liquid Administration via Cup;Straw Medication Administration Whole meds with puree Compensations Slow rate;Small sips/bites Postural Changes Seated upright at 90 degrees   CHL IP OTHER RECOMMENDATIONS 05/20/2016 Recommended Consults -- Oral Care Recommendations Oral care BID Other Recommendations --   CHL IP FOLLOW UP RECOMMENDATIONS 05/20/2016 Follow up Recommendations Other (comment)   CHL IP FREQUENCY AND DURATION 05/20/2016 Speech Therapy Frequency (ACUTE ONLY) min 2x/week Treatment Duration 1 week      CHL IP ORAL PHASE 05/20/2016 Oral Phase Impaired Oral - Pudding Teaspoon -- Oral - Pudding Cup -- Oral - Honey Teaspoon -- Oral - Honey Cup -- Oral - Nectar Teaspoon -- Oral - Nectar Cup -- Oral - Nectar Straw -- Oral - Thin Teaspoon -- Oral - Thin Cup -- Oral - Thin Straw -- Oral - Puree Weak lingual manipulation;Lingual pumping;Reduced posterior propulsion;Piecemeal swallowing;Delayed oral transit Oral - Mech Soft Weak lingual manipulation;Lingual pumping;Reduced posterior propulsion;Piecemeal swallowing;Delayed oral transit Oral - Regular -- Oral - Multi-Consistency -- Oral - Pill -- Oral Phase - Comment --  CHL IP PHARYNGEAL PHASE 05/20/2016 Pharyngeal Phase Impaired Pharyngeal- Pudding Teaspoon -- Pharyngeal -- Pharyngeal- Pudding Cup -- Pharyngeal -- Pharyngeal- Honey Teaspoon -- Pharyngeal -- Pharyngeal- Honey Cup -- Pharyngeal -- Pharyngeal- Nectar Teaspoon -- Pharyngeal -- Pharyngeal- Nectar Cup -- Pharyngeal -- Pharyngeal- Nectar Straw -- Pharyngeal -- Pharyngeal- Thin Teaspoon -- Pharyngeal --  Pharyngeal- Thin Cup -- Pharyngeal -- Pharyngeal- Thin Straw Delayed swallow initiation-pyriform sinuses;Trace aspiration;Penetration/Aspiration during swallow;Reduced tongue base retraction;Reduced airway/laryngeal closure Pharyngeal Material enters airway, passes BELOW cords without attempt by patient to eject out (silent aspiration) Pharyngeal- Puree -- Pharyngeal -- Pharyngeal- Mechanical Soft -- Pharyngeal -- Pharyngeal- Regular -- Pharyngeal -- Pharyngeal- Multi-consistency -- Pharyngeal -- Pharyngeal- Pill -- Pharyngeal -- Pharyngeal Comment --  CHL IP CERVICAL ESOPHAGEAL PHASE 05/20/2016 Cervical Esophageal Phase (No Data) Pudding Teaspoon -- Pudding Cup -- Honey Teaspoon -- Honey Cup -- Nectar Teaspoon -- Nectar Cup -- Nectar Straw -- Thin Teaspoon -- Thin Cup -- Thin Straw -- Puree -- Mechanical Soft -- Regular -- Multi-consistency -- Pill -- Cervical Esophageal Comment -- No flowsheet data found. [name] 05/20/2016, 3:23 PM                   Scheduled Meds: . acetaminophen  1,000 mg Oral Daily  . amLODipine  5 mg Oral QHS  . carvedilol  12.5 mg Oral BID WC  . insulin aspart  0-5 Units Subcutaneous QHS  . insulin aspart  0-9 Units Subcutaneous TID WC  . insulin aspart  5 Units Subcutaneous TID WC  . insulin detemir  5 Units Subcutaneous QHS  . polyethylene glycol  17 g Oral BID  . rosuvastatin  40 mg Oral Daily   Continuous Infusions:   LOS: 8 days     Mauricio Gerome Apley, MD [facility] Pager [phone]  If 7PM-7AM, please contact night-coverage [email or web address] Password [email or web address] 05/21/2016, 1:21 PM

## 2016-05-21 NOTE — Progress Notes (Signed)
Physical Therapy Treatment Patient Details Name: [name] [name] MRN: [id number] DOB: Jun 16, 1934 Today's Date: 05/21/2016    History of Present Illness 82 yo male admitted with pleural effusion, fecal impaction, R pneumothorax. Hx of CAD, CABG, DM, PTSD, anemia, COPD, MI, LBBB    PT Comments    Pt on BSC on arrival.  Spouse in room.  Pt required + 2 assist to rise from [facility] and required + 2 assist and EVA walker to amb a limited distance.  Assisted back to bed.  Follow Up Recommendations  SNF;Supervision/Assistance - 24 hour (pt wants home but spouse states she can't physically handle him until he gets stronger to be able to get up)     Equipment Recommendations       Recommendations for Other Services       Precautions / Restrictions Precautions Precautions: Fall Precaution Comments: "bad" L knee "bone on bone"  Restrictions Weight Bearing Restrictions: No Other Position/Activity Restrictions: WBAT    Mobility  Bed Mobility Overal bed mobility: Needs Assistance Bed Mobility: Sit to Supine       Sit to supine: +2 for physical assistance;Max assist;Total assist (pt 10% )   General bed mobility comments: assisted back to bed required + 2 assist  Transfers Overall transfer level: Needs assistance Equipment used: 2 person hand held assist Transfers: Sit to/from Stand Sit to Stand: Max assist;+2 physical assistance;+2 safety/equipment;From elevated surface         General transfer comment: assisted off elevated BSC with 75% VC's on proper hand placement and increased time.  Slow  Ambulation/Gait Ambulation/Gait assistance: Max assist;+2 physical assistance;+2 safety/equipment Ambulation Distance (Feet): 8 Feet Assistive device: Bilateral platform walker (EVA walker) Gait Pattern/deviations: Step-to pattern;Decreased stance time - left;Trunk flexed Gait velocity: very slow   General Gait Details: used EVA walker for increased support and needed that.  Excessive lean  on B paded platform walker with very short steps and decreased WBing thru L LE due to his "bad knee".     Stairs            Wheelchair Mobility    Modified Rankin (Stroke Patients Only)       Balance                                    Cognition Arousal/Alertness: Awake/alert Behavior During Therapy: Flat affect Overall Cognitive Status: Difficult to assess                 General Comments: spouse present and stated "he speaks when he wants to"    Exercises      General Comments        Pertinent Vitals/Pain Pain Assessment: No/denies pain    Home Living                      Prior Function            PT Goals (current goals can now be found in the care plan section) Progress towards PT goals: Progressing toward goals    Frequency    Min 3X/week      PT Plan Current plan remains appropriate    Co-evaluation             End of Session Equipment Utilized During Treatment: Gait belt Activity Tolerance: Patient tolerated treatment well Patient left: in bed;with bed alarm set;with family/visitor present     Time: 3419-6222 PT  Time Calculation (min) (ACUTE ONLY): 27 min  Charges:  $Gait Training: 8-22 mins $Therapeutic Activity: 8-22 mins                    G Codes:      Rica Koyanagi  PTA [facility]  Acute  Rehab Pager      [phone]

## 2016-05-21 NOTE — Progress Notes (Signed)
Foley catheter d/c with 100cc's amber colored urine.

## 2016-05-22 ENCOUNTER — Inpatient Hospital Stay (HOSPITAL_COMMUNITY): Payer: PPO

## 2016-05-22 ENCOUNTER — Encounter (HOSPITAL_COMMUNITY)
Admission: EM | Disposition: A | Discharge status: Home Health | Payer: Self-pay | Source: Home / Self Care | Attending: Family Medicine

## 2016-05-22 ENCOUNTER — Encounter (HOSPITAL_COMMUNITY): Payer: Self-pay | Admitting: Respiratory Therapy

## 2016-05-22 DIAGNOSIS — J189 Pneumonia, unspecified organism: Secondary | ICD-10-CM

## 2016-05-22 DIAGNOSIS — Z9889 Other specified postprocedural states: Secondary | ICD-10-CM

## 2016-05-22 DIAGNOSIS — R339 Retention of urine, unspecified: Secondary | ICD-10-CM

## 2016-05-22 HISTORY — PX: VIDEO BRONCHOSCOPY: SHX5072

## 2016-05-22 LAB — GLUCOSE, CAPILLARY
GLUCOSE-CAPILLARY: 125 mg/dL — AB (ref 65–99)
Glucose-Capillary: 140 mg/dL — ABNORMAL HIGH (ref 65–99)
Glucose-Capillary: 222 mg/dL — ABNORMAL HIGH (ref 65–99)
Glucose-Capillary: 223 mg/dL — ABNORMAL HIGH (ref 65–99)

## 2016-05-22 SURGERY — BRONCHOSCOPY, WITH FLUOROSCOPY
Anesthesia: Moderate Sedation | Laterality: Bilateral

## 2016-05-22 MED ORDER — MIDAZOLAM HCL 5 MG/ML IJ SOLN
INTRAMUSCULAR | Status: AC
  Filled 2016-05-22: qty 2

## 2016-05-22 MED ORDER — MIDAZOLAM HCL 10 MG/2ML IJ SOLN
INTRAMUSCULAR | Status: DC | PRN
Start: 2016-05-22 — End: 2016-05-22
  Administered 2016-05-22 (×2): 1 mg via INTRAVENOUS

## 2016-05-22 MED ORDER — FENTANYL CITRATE (PF) 100 MCG/2ML IJ SOLN
INTRAMUSCULAR | Status: AC
  Filled 2016-05-22: qty 4

## 2016-05-22 MED ORDER — BUTAMBEN-TETRACAINE-BENZOCAINE 2-2-14 % EX AERO
1.0000 | INHALATION_SPRAY | Freq: Once | CUTANEOUS | Status: DC
  Filled 2016-05-22: qty 20

## 2016-05-22 MED ORDER — SODIUM CHLORIDE 0.9 % IV SOLN
INTRAVENOUS | Status: DC
  Administered 2016-05-22: 11:00:00 via INTRAVENOUS

## 2016-05-22 MED ORDER — PREGABALIN 25 MG PO CAPS
25.0000 mg | ORAL_CAPSULE | Freq: Two times a day (BID) | ORAL | Status: DC
  Administered 2016-05-22 – 2016-05-23 (×3): 25 mg via ORAL
  Filled 2016-05-22 (×3): qty 1

## 2016-05-22 MED ORDER — TORSEMIDE 20 MG PO TABS
20.0000 mg | ORAL_TABLET | Freq: Every day | ORAL | Status: DC
  Administered 2016-05-22 – 2016-05-23 (×2): 20 mg via ORAL
  Filled 2016-05-22 (×3): qty 1

## 2016-05-22 MED ORDER — PHENYLEPHRINE HCL 0.25 % NA SOLN
NASAL | Status: DC | PRN
  Administered 2016-05-22: 2 via NASAL

## 2016-05-22 MED ORDER — FENTANYL CITRATE (PF) 100 MCG/2ML IJ SOLN
INTRAMUSCULAR | Status: DC | PRN
  Administered 2016-05-22 (×2): 25 ug via INTRAVENOUS

## 2016-05-22 MED ORDER — LIDOCAINE HCL 2 % EX GEL
1.0000 "application " | Freq: Once | CUTANEOUS | Status: DC
  Filled 2016-05-22: qty 5

## 2016-05-22 MED ORDER — PHENYLEPHRINE HCL 0.25 % NA SOLN
1.0000 | Freq: Four times a day (QID) | NASAL | Status: DC | PRN
  Filled 2016-05-22: qty 15

## 2016-05-22 MED ORDER — LIDOCAINE HCL 1 % IJ SOLN
INTRAMUSCULAR | Status: DC | PRN
  Administered 2016-05-22: 6 mL via RESPIRATORY_TRACT

## 2016-05-22 MED ORDER — LIDOCAINE HCL 2 % EX GEL
CUTANEOUS | Status: DC | PRN
  Administered 2016-05-22: 1

## 2016-05-22 NOTE — Progress Notes (Addendum)
Pt continues to decline nocturnal cpap.  Wife stated he gets panic attacks when wearing it, but asked that I leave cpap in room in case the pt changes his mind.  Pt and his wife were advised that RT is available all night should they need assistance.

## 2016-05-22 NOTE — Consult Note (Signed)
   [facility] CM Inpatient Consult   05/22/2016  [name] [name] 07/26/1934 [id number]    Received call from inpatient [facility] stating discharge plan has changed to home with home health. Patient has declined SNF placement. Asked Probation officer to follow up to engage for [employer] [facility] services. Will follow up with [name] prior to discharge. Appreciative of inpatient [facility] collaboration.  Marthenia Rolling, MSN-Ed, RN,BSN [facility] Liaison [phone]

## 2016-05-22 NOTE — Progress Notes (Signed)
[place] [facility] Attending Note  Presenting HPI:  82 y.o. male with history of tobacco use. Patient with  Rt pleural effusion status post thoracentesis 2 during this admission. FU CT s/o RLL mass +/- consolidation Presenting complaint cough with intermittent fever and weight loss for approximately 3 months. Patient has also had known coughing/dysphagia with meals.  Subjective:     Afebrile Denies CP, dyspnea More awake & interactive    Temp:  [98 F (36.7 C)-98.8 F (37.1 C)] 98.2 F (36.8 C) (02/15 1020) Pulse Rate:  [69-95] 95 (02/15 0519) Resp:  [16-23] 23 (02/15 1100) BP: (94-139)/(42-64) 127/59 (02/15 1100) SpO2:  [89 %-100 %] 95 % (02/15 1100)  Gen.: Awake.  No acute distress. Elderly on [place] Integument: Warm and dry. Without any rash or bruising on exposed skin. Pulmonary: Decreased breath sounds in right lung base. No accessory muscle use.  Cardiovascular: Regular rate. No appreciated JVD or edema. Abdomen: Soft. Protuberant. Grossly nontender. Neurological: follows commands. Non focal HEENT: Moist mucous membranes. No scleral icterus. Lymphatics: No appreciated cervical or supraclavicular lymphadenopathy.  CBC Latest Ref Rng & Units 05/20/2016 05/18/2016 05/17/2016  WBC 4.0 - 10.5 K/uL 8.0 9.9 8.1  Hemoglobin 13.0 - 17.0 g/dL 9.2(L) 9.2(L) 9.3(L)  Hematocrit 39.0 - 52.0 % 29.6(L) 29.8(L) 30.3(L)  Platelets 150 - 400 K/uL 275 245 254    BMP Latest Ref Rng & Units 05/20/2016 05/18/2016 05/17/2016  Glucose 65 - 99 mg/dL 161(H) 174(H) 141(H)  BUN 6 - 20 mg/dL 27(H) 25(H) 23(H)  Creatinine 0.61 - 1.24 mg/dL 2.17(H) 2.36(H) 2.39(H)  Sodium 135 - 145 mmol/L 138 136 138  Potassium 3.5 - 5.1 mmol/L 4.5 4.8 4.5  Chloride 101 - 111 mmol/L 100(L) 98(L) 101  CO2 22 - 32 mmol/L '31 31 30  '$ Calcium 8.9 - 10.3 mg/dL 9.6 9.2 9.1    IMAGING/STUDIES: Right Pleural Fluid Cytology 05/14/16:  No malignant cells  Right Pleural Fluid Cytology 05/16/16: No malignant cells  CT  CHEST W/O 05/18/16:   reviewed by me. Persistent right lower lobe opacity. Right-sided hydropneumothorax with some pleural thickening suspicious for trapped lung. No pathologic mediastinal adenopathy. No pericardial effusion.  MICROBIOLOGY: Blood Cultures x2 2/6:  Negative Urine Culture 2/6:  Negative  Right Pleural Fluid Culture 2/7:  Negative Right Pleural Fluid Culture 2/9 >>> Urine Streptococcal Antigen 2/6:  Negative   ANTIBIOTICS: Azithromycin 2/6 >>> (2/14 stop date) Rocephin 2/6 >>> (2/14 stop date)  ASSESSMENT/PLAN:  82 y.o. male with  chronic aspiration with RLL nmass & effusion. Pleural fluid cytology  negative. Differential diagnosis includes aspiration pneumonia with consolidated lung versus lung cancer. I note right lower lobe consolidation on CXR 03/2015    1. Exudative right pleural effusion. 2. Right lower lobe consolidation versus mass:  3. Probable chronic aspiration: on diet, cleared swallow 4. Probable aspiration pneumonia: Currently on Rocephin , dc Azithromycin    Proceed with bronchoscopy today   Kara Mead MD. FCCP. [place] [facility] Pager [phone] If no response call [phone]     11:31 AM 05/22/16

## 2016-05-22 NOTE — Progress Notes (Signed)
Video Bronchoscopy done Intervention Bronchial washing Intervention Bronchial biopsy Intervention Bronchial brushing Procedure tolerated well 

## 2016-05-22 NOTE — Progress Notes (Signed)
CSW met with pt / spouse at bedside to assist with d/c planning. Pt will return home with [facility] services at d/c. RNCM is assisting with d/c planning.   CSW signing off.  Keontre Lean LCSW [phone]

## 2016-05-22 NOTE — Op Note (Signed)
[facility] Cardiopulmonary Patient Name: [name] [name] Procedure Date: 05/22/2016 MRN: [id number] Attending MD: Leanna Sato. Elsworth Soho MD, MD Date of Birth: 09/09/1934 CSN: [id number] Age: 82 Admit Type: Inpatient Ethnicity: Not Hispanic or Latino Procedure:            Bronchoscopy Indications:          Right lower lobe mass Providers:            Leanna Sato. Elsworth Soho MD, MD, Andre Lefort RRT,RCP, Ashley Mariner RRT,RCP Referring MD:          Medicines:            Midazolam 2 mg IV, Fentanyl 50 mcg IV, Lidocaine                        applied to nares and subglottic space Complications:        No immediate complications. Estimated blood loss:                        Minimal Estimated Blood Loss: Estimated blood loss was minimal. Procedure:      Pre-Anesthesia Assessment:      - Prior to the procedure, a History and Physical was performed, and       patient medications and allergies were reviewed. The patient's tolerance       of previous anesthesia was also reviewed. The risks and benefits of the       procedure and the sedation options and risks were discussed with the       patient. All questions were answered, and informed consent was obtained.       Prior Anticoagulants: The patient has taken no previous anticoagulant or       antiplatelet agents. ASA Grade Assessment: II - A patient with mild       systemic disease. After reviewing the risks and benefits, the patient       was deemed in satisfactory condition to undergo the procedure.      After obtaining informed consent, the bronchoscope was passed under       direct vision. Throughout the procedure, the patient's blood pressure,       pulse, and oxygen saturations were monitored continuously. the [id number]       ([id number]) scope was introduced through the right nostril and advanced to       the tracheobronchial tree. The procedure was accomplished with ease. The       patient tolerated the  procedure. Findings:      Respiratory tract: The larynx is normal. The vocal cords appear normal.       The subglottic space is normal. The trachea is of normal caliber. The       carina is sharp. The entire tracheobronchial tree was examined to at       least the first subsegmental level. Bronchial mucosa and anatomy are       normal; there are no endobronchial lesions and no secretions, except in       the right lower lobe.      Brushings of a mass were obtained in the right lower lobe with a       cytology brush and sent for routine cytology.      Washings were obtained in the right lower lobe and  sent for cell count,       bacterial culture, viral smears & culture, and fungal & AFB analysis and       cytology. The return was bloody.      Endobronchial biopsies of a lesion were performed in the right lower       lobe using a forceps and sent for histopathology examination. Five       samples were obtained.      Suctioning was performed and the airway was cleared. Impression:      - Right lower lobe mass      - Brushings were obtained.      - Washings were obtained.      - An endobronchial biopsy was performed.      - Suctioning was performed.      - Copious, mucopurulent secretions were found in the right lower lobe. Moderate Sedation:      Moderate (conscious) sedation was personally administered by the       endoscopist. The following parameters were monitored: oxygen saturation,       heart rate, blood pressure, and response to care. Total physician       intraservice time was 17 minutes. Recommendation:      - Await test results. Procedure Code(s):      --- Professional ---      [phone], Bronchoscopy, rigid or flexible, including fluoroscopic guidance,       when performed; with bronchial or endobronchial biopsy(s), single or       multiple sites      31623, Bronchoscopy, rigid or flexible, including fluoroscopic guidance,       when performed; with brushing or protected  brushings      99152, Moderate sedation services provided by the same physician or       other qualified health care professional performing the diagnostic or       therapeutic service that the sedation supports, requiring the presence       of an independent trained observer to assist in the monitoring of the       patient's level of consciousness and physiological status; initial 15       minutes of intraservice time, patient age 73 years or older Diagnosis Code(s):      --- Professional ---      R91.8, Other nonspecific abnormal finding of lung field      R09.89, Other specified symptoms and signs involving the circulatory and       respiratory systems CPT copyright 2016 American Medical Association. All rights reserved. The codes documented in this report are preliminary and upon coder review may  be revised to meet current compliance requirements. Kara Mead, MD Leanna Sato Elsworth Soho MD, MD 05/22/2016 11:44:10 AM This report has been signed electronically. Number of Addenda: 0 Scope In: 11:13:49 AM Scope Out: 11:30:21 AM

## 2016-05-22 NOTE — Care Management Note (Addendum)
Case Management Note  Patient Details  Name: Jojo Pehl MRN: [id number] Date of Birth: 02-Jan-1935  Subjective/Objective:         82 yo admitted with Pleural Effusion.           Action/Plan: Pt from home with spouse. This CM met with pt wife at bedside as pt in procedure. Pt wife states that pt does not want to go to SNF so she will take him home. Choice offered for [facility] services and [facility] chosen. Referral called to [facility]. Wife requesting hospital bed, wheelchair and BSC. [facility] rep alerted of DME needs and MD asked for all orders. Private duty provider list given to wife.  Wife is requesting ambulance transport to home at discharge. CM will fill out ambulance transport forms at time of discharge. [facility] referral made. CM will continue to follow and assist as needed.  Expected Discharge Date:   (unknown)               Expected Discharge Plan:  [place]  In-House Referral:  Clinical Social Work  Discharge planning Services  CM Consult  Post Acute Care Choice:  Home Health Choice offered to:  Spouse  DME Arranged:  3-N-1, Youth worker wheelchair with seat cushion, Hospital bed, access ramp and trapeze bar DME Agency:  [place]:  RN, PT, OT, Nurse's Aide [place] Agency:  [place]  Status of Service:  In process, will continue to follow  If discussed at Long Length of Stay Meetings, dates discussed:    Additional CommentsLynnell Catalan, RN 05/22/2016, 11:49 AM  [phone]

## 2016-05-22 NOTE — Progress Notes (Signed)
Patient had bronchoscopy this am,per Arbie Cookey in respiratory, [patient will be  NPO until 1330 and resumed Regular diet. Vital signs obtained. Will continue to monitor.

## 2016-05-22 NOTE — Progress Notes (Signed)
PROGRESS NOTE    [name] [name]  OIZ:[id number] DOB: Mar 26, 1935 DOA: 05/13/2016 PCP: Elsie Stain, MD    Brief Narrative: 82 yo male with ischemic cardiomyopathy, fraction 35% and chronic kidney disease stage III. Patient was found in hypoxic respiratory failure down to 88%. CT scan showed a large right pleural effusion. Patient was admitted with a working diagnosis of hypoxic respiratory failure due to community acquried pneumonia with a parapneumonic effusion. Patient was found to have a right base lung mass, had thoracentesis x2. Pulmonary consultation for possible tissue biopsy. Plan for bronchoscopic evaluation.    Assessment & Plan:   Active Problems:   Chronic systolic CHF (congestive heart failure) (HCC)   Stage 3 chronic renal impairment associated with type 2 diabetes mellitus (HCC)   Anemia, iron deficiency   Pleural effusion on right   CAP (community acquired pneumonia)   Microcytic anemia   Melena   Fecal impaction (HCC)   Acute respiratory failure with hypoxia (HCC)   AKI (acute kidney injury) ([place])   CKD (chronic kidney disease), stage III   Right lower lobe lung mass   Postprocedural pneumothorax   Pleural effusion   Lung consolidation ([place])   Aspiration pneumonia of right lower lobe ([place])   1. Community acquired pneumonia. Completed antibiotic therappy. Continue oxymetry monitoring and supplemental oxygen per [place]. Target 02 sat above 02%. Continue aspiration precautions.   2. Right pleural exudate effusion with hypoxic respiratory failure.  Patient sp thoracentesis x2, with total 3,82 liters of exudate fluid removed with improvement of oxygenation. Oxygen saturation 92% on room air at rest.   3. Urinary retention. Foley catheter has been removed, patient with no further urinary retention.   4. Fecal impaction. Continue bowel regimen with Miralax bid.     5. Systolic heart failure. Stable, systolic ejection fraction 30 to 35%, will resume furosemide, continue  to hold on aldactone, continue coreg. Blood pressure control with amlodipine.    6. AKI on CKD. Will follow renal panel in am. Will resume torsemide, will continue to hold on aldactone due to risk for hyperKalemia.    7. T2DM. Glucose cover and monitoring with insulin sliding scale. Capillary glucose 107-140-125. Patient tolerating po well with improved appetite. Continue pregabalin.    8. Right base lung mass. SP bronchoscopy with brushing, lavage and biopsy. Will follow on pathology and pulmonary recommendations, possible discharge in am.   DVT prophylaxis: scd  Code Status: full  Family Communication: I spoke with patient's family at the bedside and all questions were addressed.  Disposition Plan: Home   Consultants:   Pulmonary   Procedures:     Antimicrobials:   Ceftriaxone  azithromycin     Subjective: Patient feeling better, dyspnea has improved, no nausea or vomiting. Foley catheter has been removed, patient voiding with no problems.   Objective: Vitals:   05/21/16 1459 05/21/16 1700 05/21/16 2202 05/22/16 0519  BP: (!) 94/53 (!) 122/42 139/64 131/63  Pulse: 69 69 76 95  Resp: '18  16 18  '$ Temp: 98 F (36.7 C)  98.2 F (36.8 C) 98.8 F (37.1 C)  TempSrc: Oral  Oral Oral  SpO2: 91%  93% 98%  Weight:      Height:        Intake/Output Summary (Last 24 hours) at 05/22/16 0953 Last data filed at 05/22/16 0100  Gross per 24 hour  Intake              240 ml  Output  875 ml  Net             -635 ml   Filed Weights   05/13/16 1908  Weight: 108.7 kg (239 lb 11.2 oz)    Examination:  General exam: deconditioned E ENT: mild pallor, no icterus. Oral mucosa.  Respiratory system: Mild decreased breath sounds, at the right base.  Cardiovascular system: S1 & S2 heard, RRR. No JVD, murmurs, rubs, gallops or clicks. No pedal edema. Gastrointestinal system: Abdomen is nondistended, soft and nontender. No organomegaly or masses felt. Normal bowel  sounds heard. Central nervous system: Alert and oriented. No focal neurological deficits. Extremities: Symmetric 5 x 5 power. Skin: No rashes, lesions or ulcers  Data Reviewed: I have personally reviewed following labs and imaging studies  CBC:  Recent Labs Lab 05/15/16 1135 05/17/16 1031 05/18/16 0400 05/20/16 1017  WBC 8.7 8.1 9.9 8.0  HGB 9.3* 9.3* 9.2* 9.2*  HCT 30.1* 30.3* 29.8* 29.6*  MCV 90.4 89.6 88.4 88.6  PLT 249 254 245 034   Basic Metabolic Panel:  Recent Labs Lab 05/15/16 1135 05/16/16 0652 05/17/16 0412 05/18/16 0400 05/20/16 1017  NA 135 137 138 136 138  K 4.6 4.4 4.5 4.8 4.5  CL 100* 100* 101 98* 100*  CO2 '28 29 30 31 31  '$ GLUCOSE 272* 152* 141* 174* 161*  BUN 26* 23* 23* 25* 27*  CREATININE 2.32* 2.17* 2.39* 2.36* 2.17*  CALCIUM 8.6* 9.1 9.1 9.2 9.6   GFR: Estimated Creatinine Clearance: 32.4 mL/min (by C-G formula based on SCr of 2.17 mg/dL (H)). Liver Function Tests: No results for input(s): AST, ALT, ALKPHOS, BILITOT, PROT, ALBUMIN in the last 168 hours. No results for input(s): LIPASE, AMYLASE in the last 168 hours.  Recent Labs Lab 05/17/16 1629  AMMONIA 26   Coagulation Profile: No results for input(s): INR, PROTIME in the last 168 hours. Cardiac Enzymes:  Recent Labs Lab 05/20/16 1645  TROPONINI 0.03*   BNP (last 3 results)  Recent Labs  04/08/16 1529  PROBNP 511.0*   HbA1C: No results for input(s): HGBA1C in the last 72 hours. CBG:  Recent Labs Lab 05/21/16 0748 05/21/16 1232 05/21/16 1742 05/21/16 2156 05/22/16 0806  GLUCAP 141* 197* 186* 107* 140*   Lipid Profile: No results for input(s): CHOL, HDL, LDLCALC, TRIG, CHOLHDL, LDLDIRECT in the last 72 hours. Thyroid Function Tests: No results for input(s): TSH, T4TOTAL, FREET4, T3FREE, THYROIDAB in the last 72 hours. Anemia Panel: No results for input(s): VITAMINB12, FOLATE, FERRITIN, TIBC, IRON, RETICCTPCT in the last 72 hours. Sepsis Labs: No results for  input(s): PROCALCITON, LATICACIDVEN in the last 168 hours.  Recent Results (from the past 240 hour(s))  Urine culture     Status: None   Collection Time: 05/13/16  1:16 PM  Result Value Ref Range Status   Specimen Description URINE, CLEAN CATCH  Final   Special Requests NONE  Final   Culture   Final    NO GROWTH Performed at [facility], [address], [place], [place] [address]    Report Status 05/15/2016 FINAL  Final  Blood culture (routine x 2)     Status: None   Collection Time: 05/13/16  6:00 PM  Result Value Ref Range Status   Specimen Description BLOOD LEFT HAND  Final   Special Requests BOTTLES DRAWN AEROBIC AND ANAEROBIC 5CC  Final   Culture   Final    NO GROWTH 5 DAYS Performed at [facility], [place],  [place] [address]    Report Status 05/18/2016 FINAL  Final  Blood culture (routine x 2)     Status: None   Collection Time: 05/13/16  6:20 PM  Result Value Ref Range Status   Specimen Description BLOOD RIGHT HAND  Final   Special Requests IN PEDIATRIC BOTTLE 2CC  Final   Culture   Final    NO GROWTH 5 DAYS Performed at [facility], [place] [address], [place], [place] [address]    Report Status 05/18/2016 FINAL  Final  Culture, body fluid-bottle     Status: None   Collection Time: 05/14/16 11:29 AM  Result Value Ref Range Status   Specimen Description PLEURAL RIGHT  Final   Special Requests NONE  Final   Culture   Final    NO GROWTH 5 DAYS Performed at [facility] [address] [place], [place] [address]    Report Status 05/19/2016 FINAL  Final  Gram stain     Status: None   Collection Time: 05/14/16 11:29 AM  Result Value Ref Range Status   Specimen Description PLEURAL RIGHT  Final   Special Requests NONE  Final   Gram Stain   Final    RARE WBC PRESENT,BOTH PMN AND MONONUCLEAR NO ORGANISMS SEEN Performed at [facility], [place] [address], [place], [place] [address]    Report Status 05/14/2016 FINAL  Final    Body fluid culture     Status: None   Collection Time: 05/16/16  1:30 PM  Result Value Ref Range Status   Specimen Description Pleural R  Final   Special Requests NONE  Final   Gram Stain   Final    RARE WBC PRESENT, PREDOMINANTLY MONONUCLEAR NO ORGANISMS SEEN    Culture   Final    NO GROWTH 3 DAYS Performed at [facility], [place] [address] [place],  [address]    Report Status 05/20/2016 FINAL  Final         Radiology Studies: Dg Swallowing Func-speech Pathology  Result Date: 05/20/2016 Objective Swallowing Evaluation: Type of Study: MBS-Modified Barium Swallow Study Patient Details Name: [name] [name] MRN: [id number] Date of Birth: 03-Oct-1934 Today's Date: 05/20/2016 Time: SLP Start Time (ACUTE ONLY): 1430-SLP Stop Time (ACUTE ONLY): 1500 SLP Time Calculation (min) (ACUTE ONLY): 30 min Past Medical History: Past Medical History: Diagnosis Date . Anxiety  . Arthritis   "hands" (01/06/2012) . Boil   "fluid boil left arm" (01/30/2012) . CAD (coronary artery disease)   bypass graft surgery 05/2006; grafts were widely patent on cath 06/2010  . Chronic kidney disease  . COPD (chronic obstructive pulmonary disease) ([place]) 1993 . Deafness in left ear  . Diabetes mellitus, type 2 ([place]) 2000 . Elbow mass   left; "just noticed it ~ 2 wk ago"  (01/06/2012) . Foot fracture 1953; 1962  left; left . Hyperlipidemia 1993 . Hypertension 1993 . Ischemic cardiomyopathy   EF 25-30%; s/p single chamber ICD 2010; upgrade to [employer] BiV ICD 01/30/12 . LBBB (left bundle branch block)  . Myocardial infarction 2008; 01/06/2012 . Nightmares   with psych eval 2012 at [facility] . PTSD (post-traumatic stress disorder)  . Shortness of breath 01/05/2012  "all the time; been going on for awhile" . Syncope and collapse 01/05/2012 . Systolic heart failure   class II . Tinnitus of left ear  Past Surgical History: Past Surgical History: Procedure Laterality Date . APPENDECTOMY  1957 . BI-VENTRICULAR IMPLANTABLE CARDIOVERTER  DEFIBRILLATOR UPGRADE N/A 01/30/2012  Procedure: BI-VENTRICULAR IMPLANTABLE CARDIOVERTER DEFIBRILLATOR UPGRADE;  Surgeon: Evans Lance, MD;  Location: [facility] CATH LAB;  Service: Cardiovascular;  Laterality: N/A; . CARDIAC DEFIBRILLATOR PLACEMENT  01/30/2012  biventricular ICD . CARDIAC DEFIBRILLATOR PLACEMENT  01/30/2012  biventricular ICD . CORONARY ARTERY BYPASS GRAFT  2008  3 vessel, class 4 CHF, non Q wave MI 05/19/06 . [place]; ~ 1962  left; left . INSERT / REPLACE / REMOVE PACEMAKER  01/30/2012  biventricular ICD placed HPI: 82 y.o.malewith medical history significant for coronary artery disease (ischemic cardiomyopathy) status post CABG with last EF in 2017 of 35%, kidney diseasestage III,IDDM, admitted with pleural effusion, R pneumothorax s/p thoracentesis x2 this admission, fecal impaction. Weight loss. His wife reports constant coughing with meals. Persistent RLL opacity, potential chronic aspiration per PCCM, hence swallow evaluation ordered.  Subjective: answers questions, wife present Assessment / Plan / Recommendation CHL IP CLINICAL IMPRESSIONS 05/20/2016 Therapy Diagnosis Mild oral phase dysphagia;Mild pharyngeal phase dysphagia Clinical Impression Pt presents with a mild dysphagia with intermittent, trace aspiration of thin liquids.  Aspiration in small amounts did not elicit a cough response; pt was unaware that it had occurred.  There were oral delays, discoordination during the oral preparatory phase of the swallow; weak tongue propulsion with solid food residue in valleculae.  Risk of aspiration was heightened when pt consumed solids/liquids simultaneously.  Esophageal sweep revealed retention of barium in esophageal column.   For now, recommend continuing current diet with basic precautions to minimize aspiration; will provide pt with specific exercises to address deficits.  SLP will follow.  Impact on safety and function Moderate aspiration risk   CHL IP TREATMENT  RECOMMENDATION 05/20/2016 Treatment Recommendations Therapy as outlined in treatment plan below   No flowsheet data found. CHL IP DIET RECOMMENDATION 05/20/2016 SLP Diet Recommendations Regular solids;Thin liquid Liquid Administration via Cup;Straw Medication Administration Whole meds with puree Compensations Slow rate;Small sips/bites Postural Changes Seated upright at 90 degrees   CHL IP OTHER RECOMMENDATIONS 05/20/2016 Recommended Consults -- Oral Care Recommendations Oral care BID Other Recommendations --   CHL IP FOLLOW UP RECOMMENDATIONS 05/20/2016 Follow up Recommendations Other (comment)   CHL IP FREQUENCY AND DURATION 05/20/2016 Speech Therapy Frequency (ACUTE ONLY) min 2x/week Treatment Duration 1 week      CHL IP ORAL PHASE 05/20/2016 Oral Phase Impaired Oral - Pudding Teaspoon -- Oral - Pudding Cup -- Oral - Honey Teaspoon -- Oral - Honey Cup -- Oral - Nectar Teaspoon -- Oral - Nectar Cup -- Oral - Nectar Straw -- Oral - Thin Teaspoon -- Oral - Thin Cup -- Oral - Thin Straw -- Oral - Puree Weak lingual manipulation;Lingual pumping;Reduced posterior propulsion;Piecemeal swallowing;Delayed oral transit Oral - Mech Soft Weak lingual manipulation;Lingual pumping;Reduced posterior propulsion;Piecemeal swallowing;Delayed oral transit Oral - Regular -- Oral - Multi-Consistency -- Oral - Pill -- Oral Phase - Comment --  CHL IP PHARYNGEAL PHASE 05/20/2016 Pharyngeal Phase Impaired Pharyngeal- Pudding Teaspoon -- Pharyngeal -- Pharyngeal- Pudding Cup -- Pharyngeal -- Pharyngeal- Honey Teaspoon -- Pharyngeal -- Pharyngeal- Honey Cup -- Pharyngeal -- Pharyngeal- Nectar Teaspoon -- Pharyngeal -- Pharyngeal- Nectar Cup -- Pharyngeal -- Pharyngeal- Nectar Straw -- Pharyngeal -- Pharyngeal- Thin Teaspoon -- Pharyngeal -- Pharyngeal- Thin Cup -- Pharyngeal -- Pharyngeal- Thin Straw Delayed swallow initiation-pyriform sinuses;Trace aspiration;Penetration/Aspiration during swallow;Reduced tongue base retraction;Reduced  airway/laryngeal closure Pharyngeal Material enters airway, passes BELOW cords without attempt by patient to eject out (silent aspiration) Pharyngeal- Puree -- Pharyngeal -- Pharyngeal- Mechanical Soft -- Pharyngeal -- Pharyngeal- Regular -- Pharyngeal --  Pharyngeal- Multi-consistency -- Pharyngeal -- Pharyngeal- Pill -- Pharyngeal -- Pharyngeal Comment --  CHL IP CERVICAL ESOPHAGEAL PHASE 05/20/2016 Cervical Esophageal Phase (No Data) Pudding Teaspoon -- Pudding Cup -- Honey Teaspoon -- Honey Cup -- Nectar Teaspoon -- Nectar Cup -- Nectar Straw -- Thin Teaspoon -- Thin Cup -- Thin Straw -- Puree -- Mechanical Soft -- Regular -- Multi-consistency -- Pill -- Cervical Esophageal Comment -- No flowsheet data found. [name] 05/20/2016, 3:23 PM                   Scheduled Meds: . acetaminophen  1,000 mg Oral Daily  . amLODipine  5 mg Oral QHS  . carvedilol  12.5 mg Oral BID WC  . insulin aspart  0-5 Units Subcutaneous QHS  . insulin aspart  0-9 Units Subcutaneous TID WC  . insulin aspart  5 Units Subcutaneous TID WC  . insulin detemir  5 Units Subcutaneous QHS  . polyethylene glycol  17 g Oral BID  . rosuvastatin  40 mg Oral Daily   Continuous Infusions:   LOS: 9 days     Mauricio Gerome Apley, MD [facility] Pager [phone]  If 7PM-7AM, please contact night-coverage [email or web address] Password [facility] 05/22/2016, 9:53 AM

## 2016-05-23 ENCOUNTER — Inpatient Hospital Stay (HOSPITAL_COMMUNITY): Payer: PPO

## 2016-05-23 ENCOUNTER — Encounter (HOSPITAL_COMMUNITY): Payer: Self-pay | Admitting: Pulmonary Disease

## 2016-05-23 ENCOUNTER — Other Ambulatory Visit: Payer: Self-pay

## 2016-05-23 DIAGNOSIS — I5022 Chronic systolic (congestive) heart failure: Secondary | ICD-10-CM

## 2016-05-23 DIAGNOSIS — N183 Chronic kidney disease, stage 3 (moderate): Secondary | ICD-10-CM

## 2016-05-23 DIAGNOSIS — IMO0002 Reserved for concepts with insufficient information to code with codable children: Secondary | ICD-10-CM

## 2016-05-23 DIAGNOSIS — J95811 Postprocedural pneumothorax: Secondary | ICD-10-CM

## 2016-05-23 DIAGNOSIS — E1122 Type 2 diabetes mellitus with diabetic chronic kidney disease: Secondary | ICD-10-CM

## 2016-05-23 LAB — CBC WITH DIFFERENTIAL/PLATELET
Basophils Absolute: 0 10*3/uL (ref 0.0–0.1)
Basophils Relative: 0 %
EOS ABS: 0.3 10*3/uL (ref 0.0–0.7)
Eosinophils Relative: 3 %
HCT: 29.3 % — ABNORMAL LOW (ref 39.0–52.0)
Hemoglobin: 9 g/dL — ABNORMAL LOW (ref 13.0–17.0)
LYMPHS PCT: 14 %
Lymphs Abs: 1.3 10*3/uL (ref 0.7–4.0)
MCH: 27.2 pg (ref 26.0–34.0)
MCHC: 30.7 g/dL (ref 30.0–36.0)
MCV: 88.5 fL (ref 78.0–100.0)
MONOS PCT: 9 %
Monocytes Absolute: 0.8 10*3/uL (ref 0.1–1.0)
Neutro Abs: 6.6 10*3/uL (ref 1.7–7.7)
Neutrophils Relative %: 74 %
PLATELETS: 272 10*3/uL (ref 150–400)
RBC: 3.31 MIL/uL — AB (ref 4.22–5.81)
RDW: 15.3 % (ref 11.5–15.5)
WBC: 8.9 10*3/uL (ref 4.0–10.5)

## 2016-05-23 LAB — GLUCOSE, CAPILLARY
GLUCOSE-CAPILLARY: 184 mg/dL — AB (ref 65–99)
GLUCOSE-CAPILLARY: 157 mg/dL — AB (ref 65–99)
GLUCOSE-CAPILLARY: 192 mg/dL — AB (ref 65–99)

## 2016-05-23 LAB — ACID FAST SMEAR (AFB): ACID FAST SMEAR - AFSCU2: NEGATIVE

## 2016-05-23 LAB — BASIC METABOLIC PANEL
Anion gap: 8 (ref 5–15)
BUN: 30 mg/dL — AB (ref 6–20)
CHLORIDE: 103 mmol/L (ref 101–111)
CO2: 30 mmol/L (ref 22–32)
CREATININE: 2.16 mg/dL — AB (ref 0.61–1.24)
Calcium: 9.4 mg/dL (ref 8.9–10.3)
GFR calc Af Amer: 32 mL/min — ABNORMAL LOW (ref >60)
GFR, EST NON AFRICAN AMERICAN: 27 mL/min — AB (ref >60)
GLUCOSE: 152 mg/dL — AB (ref 65–99)
Potassium: 4 mmol/L (ref 3.5–5.1)
SODIUM: 141 mmol/L (ref 135–145)

## 2016-05-23 LAB — ACID FAST SMEAR (AFB, MYCOBACTERIA)

## 2016-05-23 MED ORDER — CARVEDILOL 12.5 MG PO TABS: 12.5000 mg | ORAL_TABLET | Freq: Two times a day (BID) | ORAL | 0 refills | Status: AC

## 2016-05-23 NOTE — Discharge Summary (Signed)
Physician Discharge Summary  [name] [name] HWE:[id number] DOB: 1934-08-07 DOA: 05/13/2016  PCP: Elsie Stain, MD  Admit date: 05/13/2016 Discharge date: 05/23/2016  Admitted From: Home Disposition:  Home   Recommendations for Outpatient Follow-up:  1. Follow up with PCP in 1weeks 2. Lung biopsy pending  Home Health: Yes  Equipment/Devices: Hospital bed, wheelchair and bedside commode  Discharge Condition: Stable  CODE STATUS: Full  Diet recommendation: Heart Healthy / Carb Modified    Brief/Interim Summary: This is a 82 year old male who presented to the hospital with a chief complaint of urinary retention and fecal impaction. Patient had a recent weight loss, 30-40 pounds in the last 3 months. Patient was noted to have persistent cough but no frank dyspnea. On initial physical examination he was found hypoxic down to 88% on room air, heart rate 93, respiratory rate 18, blood pressure 117/68. His mucous membranes were dry, positive rales at the right lower lobe, heart S1-S2 present rhythmic, his abdomen was soft nontender, lower extremities no edema. Sodium 138, potassium 4.3, chloride 101, bicarbonate 28, glucose 176, creatinine 2.1, BUN 34, white count 12.9, hemoglobin 9.5, hematocrit 30.0, platelets 268, urinalysis negative for infection, abdominal CT showed large right pleural effusion with right lower lobe and middle lobe consolidation. Small gallstones without complicating factors. Small bilateral nonobstructing renal stones measuring less than 5 mm. Chest x-ray showed a large right pleural effusion. CT chest showed large right pleural effusion with compressive atelectasis. EKG showed a sense, AV paced 100%  The patient was admitted to the hospital with the working diagnosis of acute hypoxic respiratory failure due to large right pleural effusion, with possible pneumonia, complicated by AKI, urinary retention and fecal impaction.  1.  Acute hypoxic respiratory failure, community-acquired  pneumonia, present on admission. Patient was admitted to the medical floor with a remote telemetry, he was placed on supplemental oxygen per nasal cannula, oximetry monitoring. Patient was placed on antibiotic therapy with ceftriaxone and azithromycin. Cultures remain no growth, he completed his antibiotic therapy in the hospital. Sputum cultures from bronchoscopy still pending. Patient had thoracentesis with significant improvement of his oxygenation, patient will have a home oxygen screen before discharge. OFS, was performed with recommendations for regular solids, thin liquids, whole meds with puree and seated upright at 90 degrees.   2. Large right pleural effusion. Patient had ultrasound-guided thoracentesis 2. Total of 3.8 L were removed, and analysis determined to be an exudate. Cytology with no malignant cells. Follow-up CT February 11 showed small anterior right pneumothorax.  3. Right lower lobe mass. Patient underwent bronchoscopy, brushings, washings and biopsy were obtained. Noted copious mucopurulent secretions. Follow-up on biopsy results. CT chest post thoracentesis showed right lower lobe mass measuring 10 cm.    4. Acute kidney injury chronic kidney disease 3. Calculated baseline GFR 38, will resume torsemide at discharge, will hold on aldactone due to risk of hyperkalemia.   5. Systolic heart failure, ejection fraction 30-35% chronic and stable, patient will resume torsemide, Coreg, blood pressure control with amlodipine. Holding ace inhibitor until GFR stabilizes.   6. Type 2 diabetes mellitus. Patient was placed on insulin sliding-scale, capillary glucose 222, 223, 184, patient tolerating by mouth diet adequately. Patient will resume insulin regimen, including insulin pump.  7. Fecal impaction and urinary retention. Patient had a bowel regimen with a successful bowel movements. Initially he required Foley catheter which has been removed with good urine output.    Discharge  Diagnoses:  Active Problems:   Chronic systolic CHF (congestive heart failure) (  [place])   Stage 3 chronic renal impairment associated with type 2 diabetes mellitus (HCC)   Anemia, iron deficiency   Pleural effusion on right   Community acquired pneumonia of right lung   Microcytic anemia   Melena   Fecal impaction (HCC)   Acute respiratory failure with hypoxia (HCC)   AKI (acute kidney injury) ([place])   CKD (chronic kidney disease), stage III   Right lower lobe lung mass   Postprocedural pneumothorax   Pleural effusion   Lung consolidation (HCC)   Aspiration pneumonia of right lower lobe ([place])   Urinary retention   S/P thoracentesis    Discharge Instructions   Allergies as of 05/23/2016      Reactions   Sulfa Antibiotics Swelling, Rash   Hands swell      Medication List    STOP taking these medications   acetaminophen 500 MG tablet Commonly known as:  TYLENOL   cephALEXin 500 MG capsule Commonly known as:  KEFLEX   spironolactone 25 MG tablet Commonly known as:  ALDACTONE     TAKE these medications   amLODipine 5 MG tablet Commonly known as:  NORVASC Take 5 mg by mouth at bedtime.   aspirin 81 MG tablet Take 81 mg by mouth at bedtime.   BONE SMART PO Take 1 tablet by mouth daily.   carvedilol 12.5 MG tablet Commonly known as:  COREG Take 1 tablet (12.5 mg total) by mouth 2 (two) times daily with a meal. What changed:  See the new instructions.   diazepam 10 MG tablet Commonly known as:  VALIUM TAKE 1/2 TO 1 TABLET BY MOUTH AT BEDTIMEAS NEEDED FOR ANXIETY OR RINGING IN EARS   ferrous sulfate 325 (65 FE) MG tablet TAKE 1 TABLET BY MOUTH ONCE A DAY WITH BREAKFAST What changed:  See the new instructions.   nitroGLYCERIN 0.4 MG SL tablet Commonly known as:  NITROSTAT Place 1 tablet (0.4 mg total) under the tongue every 5 (five) minutes as needed. For chest pain   NOVOLOG 100 UNIT/ML injection Generic drug:  insulin aspart USE MAXIMUM OF 56 UNITS PER DAY  WITH V-GO PUMP   ONETOUCH DELICA LANCETS FINE Misc Use to check blood sugar 3 times per day dx code 250.42   ONETOUCH VERIO test strip Generic drug:  glucose blood USE AS DIRECTED TO CHECK BLOOD SUGAR THREE TIMES PER DAY   Pen Needles 31G X 5 MM Misc Inject 15 Units as directed 2 (two) times daily. Use pen needles for pt insulin pen. Dx E11.29   pregabalin 25 MG capsule Commonly known as:  LYRICA Take 1 capsule (25 mg total) by mouth 2 (two) times daily.   QUINOA KALE & HEMP PO Take by mouth daily.   rosuvastatin 40 MG tablet Commonly known as:  CRESTOR TAKE 1 TABLET BY MOUTH ONCE A DAY   torsemide 20 MG tablet Commonly known as:  DEMADEX Take 1 tablet (20 mg total) by mouth every other day.   V-GO 20 Kit USE DAILY AS DIRECTED   VICTOZA 18 MG/3ML Sopn Generic drug:  liraglutide Inject 0.2 mLs (1.2 mg total) into the skin daily. Inject once daily at the same time What changed:  additional instructions            Durable Medical Equipment        Start     Ordered   05/22/16 1412  For home use only DME Bedside commode  Once    Question:  Patient needs a  bedside commode to treat with the following condition  Answer:  Ambulatory dysfunction   05/22/16 1411   05/22/16 1410  For home use only DME Hospital bed  Once    Question Answer Comment  The above medical condition requires: Patient requires the ability to reposition frequently   Head must be elevated greater than: 45 degrees   Bed type Semi-electric      05/22/16 1411   05/22/16 1410  For home use only DME standard manual wheelchair with seat cushion  Once    Comments:  Patient suffers from ambulatory dysfunction which impairs their ability to perform daily activities like ADL in the home.  A walking aid will not resolve  issue with performing activities of daily living. A wheelchair will allow patient to safely perform daily activities. Patient can safely propel the wheelchair in the home or has a caregiver  who can provide assistance.  Accessories: elevating leg rests (ELRs), wheel locks, extensions and anti-tippers.   05/22/16 1411   05/22/16 1239  For home use only DME Access ramp  Once     05/22/16 1239   05/22/16 1227  For home use only DME Trapeze  Once     05/22/16 1226     Follow-up Information    Elsie Stain, MD Follow up in 1 week(s).   Specialty:  Family Medicine Contact information: [place] [address] [phone]          Allergies  Allergen Reactions  . Sulfa Antibiotics Swelling and Rash    Hands swell    Consultations:  Pulmonary    Procedures/Studies: Ct Abdomen Pelvis Wo Contrast  Result Date: 05/13/2016 CLINICAL DATA:  Abdominal pain and constipation EXAM: CT ABDOMEN AND PELVIS WITHOUT CONTRAST TECHNIQUE: Multidetector CT imaging of the abdomen and pelvis was performed following the standard protocol without IV contrast. COMPARISON:  None. FINDINGS: Lower chest: Left lung base is clear. The right lung base demonstrates a large pleural effusion with complete consolidation of the right lower lobe. Patchy density is noted within the collapsed lung. The possibility of underlying neoplasm could not be totally excluded on the basis of this exam. Hepatobiliary: Dependent small gallstones are noted. The gallbladder and liver are otherwise within normal limits. Pancreas: Unremarkable. No pancreatic ductal dilatation or surrounding inflammatory changes. Spleen: Normal in size without focal abnormality. Adrenals/Urinary Tract: Adrenal glands are within normal limits. Scattered renal vascular calcifications are seen. A few nonobstructing stones are noted measuring less than 5 mm. The ureters are well visualized bilaterally. The bladder has been decompressed by Foley catheter. Stomach/Bowel: The appendix has been surgically removed. Mild fecal material is noted within the colon although no findings to suggest, significant constipation or obstruction are  noted. No inflammatory changes are seen. Vascular/Lymphatic: Aortic atherosclerosis. No enlarged abdominal or pelvic lymph nodes. Reproductive: Prostate is mildly enlarged with diffuse calcifications. Other: No abdominal wall hernia or abnormality. No abdominopelvic ascites. Musculoskeletal: No acute or significant osseous findings. IMPRESSION: Large right pleural effusion with right lower lobe and middle lobe consolidation. Varying densities are seen within the collapsed right lung. Possibility of neoplasm could not be totally excluded. Small gallstones without complicating factors. Small bilateral nonobstructing renal stones measuring less than 5 mm. Electronically Signed   By: Inez Catalina M.D.   On: 05/13/2016 16:15   Dg Chest 1 View  Result Date: 05/16/2016 CLINICAL DATA:  Status post right-sided thoracentesis. EXAM: CHEST 1 VIEW COMPARISON:  Study obtained earlier in the day. FINDINGS: Most  of the right pleural effusion noted previously has been removed. No pneumothorax. A small amount of pleural effusion remains on the right. There is consolidation in the right base medially. Left lung is clear. There is apparent eventration of the right hemidiaphragm. Heart is mildly enlarged with pulmonary vascularity within normal limits. Pacemaker leads are attached to the right atrium, right ventricle, and coronary sinus. No adenopathy. There is atherosclerotic calcification in the aorta. No bone lesions. IMPRESSION: No pneumothorax post thoracentesis. Small amount of residual right pleural effusion as well as right base consolidation. Eventration right hemidiaphragm. Left lung clear. Stable cardiac prominence. There is aortic atherosclerosis Electronically Signed   By: Lowella Grip III M.D.   On: 05/16/2016 14:17   Dg Chest 1 View  Result Date: 05/14/2016 CLINICAL DATA:  Right thoracentesis EXAM: CHEST 1 VIEW COMPARISON:  CT chest of 05/13/2016 and chest x-ray of 05/13/2016 FINDINGS: After right  thoracentesis, the volume of right pleural effusion has diminished. There is a tiny right apical pneumothorax present of less than 5%. The left lung is clear. Cardiomegaly is stable and AICD leads remain. IMPRESSION: 1. Tiny right apical pneumothorax after right thoracentesis. 2. Decrease in volume of right pleural effusion. Electronically Signed   By: Ivar Drape M.D.   On: 05/14/2016 11:47   Dg Chest 2 View  Result Date: 05/13/2016 CLINICAL DATA:  Pleural effusion. EXAM: CHEST  2 VIEW COMPARISON:  04/04/2015 FINDINGS: Large right pleural effusion. Fall a small amount of aerated right upper lobe noted. There is cardiomegaly. Prior CABG. Left AICD is unchanged. No confluent opacity or effusion on the left. IMPRESSION: Large right pleural effusion with only a small amount of aerated right upper lobe. Cardiomegaly. Electronically Signed   By: Rolm Baptise M.D.   On: 05/13/2016 17:41   Ct Head Wo Contrast  Result Date: 05/18/2016 CLINICAL DATA:  Increased lethargy, altered mental status EXAM: CT HEAD WITHOUT CONTRAST TECHNIQUE: Contiguous axial images were obtained from the base of the skull through the vertex without intravenous contrast. COMPARISON:  03/21/2015 FINDINGS: Brain: No evidence of acute infarction, hemorrhage, extra-axial collection, ventriculomegaly, or mass effect. Generalized cerebral atrophy. Periventricular white matter low attenuation likely secondary to microangiopathy. Vascular: Cerebrovascular atherosclerotic calcifications are noted. Skull: Negative for fracture or focal lesion. Sinuses/Orbits: Visualized portions of the orbits are unremarkable. Visualized portions of the paranasal sinuses and mastoid air cells are unremarkable. Other: None. IMPRESSION: 1. No acute intracranial pathology. 2. Chronic microvascular disease and cerebral atrophy. Electronically Signed   By: Kathreen Devoid   On: 05/18/2016 12:13   Ct Chest Wo Contrast  Result Date: 05/18/2016 CLINICAL DATA:  RIGHT effusion.   Thoracentesis 05/16/2016 EXAM: CT CHEST WITHOUT CONTRAST TECHNIQUE: Multidetector CT imaging of the chest was performed following the standard protocol without IV contrast. COMPARISON:  CT 05/13/2016 FINDINGS: Cardiovascular: Coronary artery calcification and aortic atherosclerotic calcification. Mediastinum/Nodes: No axillary or supraclavicular adenopathy. Pacemaker in the LEFT chest wall. No mediastinal hilar adenopathy. No pericardial fluid. Esophagus normal. Lungs/Pleura: There is a small moderate this anterior RIGHT pneumothorax. The estimated volume is approximately 5 and 10%. RIGHT pleural effusion remaining is decreased in volume from comparison CT. There is however a residual mass within the RIGHT lower lobe measuring 10 .1 x 9.8 cm (image 101, series 2) and this occupies the entirety of the RIGHT lower lobe. LEFT lung is clear Upper Abdomen: No dilated loops of large or small bowel. No pathologic calcifications. No organomegaly. No aggressive osseous lesion. No intraperitoneal free air. Musculoskeletal: Lucent  lesion in the T3 vertebral body is well circumscribed in the represent hemangioma. IMPRESSION: 1. Small anterior RIGHT pneumothorax following thoracentesis 05/13/2016. 2. Decrease in pleural effusion following thoracentesis. 3. Persistent RIGHT lower lobe mass occupying the near entirety of the RIGHT lower lobe measuring up to 10 cm. Differential would include rounded pneumonia versus carcinoma. Recommend contrast CT or FDG PET scan or bronchoscopy for further evaluation to exclude LUNG CARCINOMA. Critical Value/emergent results were called by telephone at the time of interpretation on 05/18/2016 at 12:28 pm to nurse KIM, who verbally acknowledged these results. Electronically Signed   By: Suzy Bouchard M.D.   On: 05/18/2016 12:31   Ct Chest Wo Contrast  Result Date: 05/13/2016 CLINICAL DATA:  Hypoxia.  Right pleural effusion. EXAM: CT CHEST WITHOUT CONTRAST TECHNIQUE: Multidetector CT imaging  of the chest was performed following the standard protocol without IV contrast. COMPARISON:  05/13/2016 CXR and CT abdomen and pelvis exams. FINDINGS: Cardiovascular: Status post CABG. Top normal size cardiac chambers with right atrial, coronary sinus and right ventricular leads in place. Left anterior chest wall AICD device. Aortic atherosclerosis without aneurysm. No pericardial effusion. Mediastinum/Nodes: Atherosclerosis of the great vessels. No thyroid mass or thyromegaly. Trachea and mainstem bronchi appear patent. No esophageal abnormality identified. Small mediastinal lymph nodes the largest is 10 mm right lower paratracheal. Lungs/Pleura: Large right pleural effusion with compressive atelectasis sparing only a small portion of the anterior right upper lobe. Left lung is clear apart from some minimal atelectasis. Upper Abdomen: Contracted gallbladder with gallstones noted within. Splenic calcifications consistent with granulomas. No biliary dilatation. Mild adrenal gland thickening without nodularity. No acute abnormality of the visualized pancreas. Mild bilateral perinephric fat stranding. Musculoskeletal: No acute nor suspicious osseous abnormality. IMPRESSION: Large right pleural effusion with compressive atelectasis. Etiology is uncertain. Underlying malignancy is not entirely excluded. Clear left lung. Status post CABG with AICD device and leads as above described. Uncomplicated cholelithiasis. Electronically Signed   By: Ashley Royalty M.D.   On: 05/13/2016 22:27   US Scrotum  Result Date: 05/19/2016 CLINICAL DATA:  Testicular pain on palpation.  No swelling. EXAM: SCROTAL ULTRASOUND DOPPLER ULTRASOUND OF THE TESTICLES TECHNIQUE: Complete ultrasound examination of the testicles, epididymis, and other scrotal structures was performed. Color and spectral Doppler ultrasound were also utilized to evaluate blood flow to the testicles. COMPARISON:  None. FINDINGS: Right testicle Measurements: 4.3 x 2.4 x  2.7 cm. No mass or microlithiasis visualized. Left testicle Measurements: 3.5 x 2.1 x 1.6 cm. No mass or microlithiasis visualized. Right epididymis:  Normal in size and appearance. Left epididymis:  Normal in size and appearance. Hydrocele:  None visualized. Varicocele:  None visualized. Pulsed Doppler interrogation of both testes demonstrates normal low resistance arterial and venous waveforms bilaterally. IMPRESSION: Normal testicular ultrasound Electronically Signed   By: Nolon Nations M.D.   On: 05/19/2016 17:11   [place] Art/ven Flow Abd Pelv Doppler  Result Date: 05/19/2016 CLINICAL DATA:  Testicular pain on palpation.  No swelling. EXAM: SCROTAL ULTRASOUND DOPPLER ULTRASOUND OF THE TESTICLES TECHNIQUE: Complete ultrasound examination of the testicles, epididymis, and other scrotal structures was performed. Color and spectral Doppler ultrasound were also utilized to evaluate blood flow to the testicles. COMPARISON:  None. FINDINGS: Right testicle Measurements: 4.3 x 2.4 x 2.7 cm. No mass or microlithiasis visualized. Left testicle Measurements: 3.5 x 2.1 x 1.6 cm. No mass or microlithiasis visualized. Right epididymis:  Normal in size and appearance. Left epididymis:  Normal in size and appearance. Hydrocele:  None visualized.  Varicocele:  None visualized. Pulsed Doppler interrogation of both testes demonstrates normal low resistance arterial and venous waveforms bilaterally. IMPRESSION: Normal testicular ultrasound Electronically Signed   By: Nolon Nations M.D.   On: 05/19/2016 17:11   Dg Chest Port 1 View  Result Date: 05/19/2016 CLINICAL DATA:  Follow-up right pneumothorax. EXAM: PORTABLE CHEST 1 VIEW COMPARISON:  CT chest 05/18/2016 FINDINGS: Persistent right lower lobe airspace opacity. Small right pleural effusion. Mild left basilar atelectasis. No pneumothorax. Stable cardiomegaly. Prior CABG. Three lead cardiac pacemaker. IMPRESSION: No right pneumothorax. Persistent right lower lobe airspace  opacity concerning for a mass. Electronically Signed   By: Kathreen Devoid   On: 05/19/2016 10:53   Dg Chest Port 1 View  Result Date: 05/17/2016 CLINICAL DATA:  Hypoxia and right sided pleural effusion. H/o CAD, ischemic cardiomyopathy, myocardial infarction, COPD, type 2 diabetes. Surgical h/o CABG in 2008 and cardiac defibrillator placement in 2013. Former smoker. EXAM: PORTABLE CHEST - 1 VIEW COMPARISON:  05/16/2016 FINDINGS: Stable left subclavian AICD. Previous median sternotomy and CABG. Low lung volumes with crowding of bibasilar bronchovascular structures. Heart size upper limits normal for technique.  Atheromatous aorta. Blunting of bilateral lateral costophrenic angles right worse than left ; can't exclude small pleural effusions. IMPRESSION: 1. Low volumes with bibasilar crowding, possible small effusions. Electronically Signed   By: Lucrezia Europe M.D.   On: 05/17/2016 12:44   Dg Chest Port 1 View  Result Date: 05/16/2016 CLINICAL DATA:  Followup right pleural effusion. Chronic systolic congestive heart failure. Chronic kidney disease. EXAM: PORTABLE CHEST 1 VIEW COMPARISON:  05/15/2016 FINDINGS: Stable cardiomegaly. AICD remains in appropriate position. Prior CABG. Mild improvement in right mid and lower lung airspace opacity since prior study. Small to moderate right pleural effusion again noted. Left lung remains clear. IMPRESSION: Mild improvement in right mid and lower lung airspace opacity. Small to moderate right pleural effusion. Stable cardiomegaly. Electronically Signed   By: Earle Gell M.D.   On: 05/16/2016 11:09   Dg Chest Port 1 View  Result Date: 05/15/2016 CLINICAL DATA:  Right-sided pleural effusion EXAM: PORTABLE CHEST 1 VIEW COMPARISON:  05/14/2016 FINDINGS: Cardiac shadow is enlarged. A defibrillator is again noted and stable. Postsurgical changes are again seen. Left lung remains clear. The tiny apical pneumothorax is not well appreciated on this exam although some apical capping  is noted and this may represent redistribution of residual fluid. New right perihilar and basilar infiltrate is noted. This may represent some re-expansion edema or progressive infiltrate. IMPRESSION: Increased infiltrative density in the right lung. This may represent some re-expansion edema. Small residual effusion is noted. Electronically Signed   By: Inez Catalina M.D.   On: 05/15/2016 07:17   Dg Abd 2 Views  Result Date: 05/16/2016 CLINICAL DATA:  Fecal impaction. Recent right thoracentesis for pleural effusion. EXAM: ABDOMEN - 2 VIEW COMPARISON:  CT on 05/13/2016 FINDINGS: No evidence of dilated bowel loops. Small to moderate stool seen throughout colon. The no evidence of free intraperitoneal air. Right pleural effusion with small pneumothorax component seen, consistent with recent thoracentesis. IMPRESSION: Unremarkable bowel gas pattern.  Small to moderate stool burden. Small right hydro-pneumothorax, status post recent right thoracentesis. Electronically Signed   By: Earle Gell M.D.   On: 05/16/2016 11:19   Dg Abd 2 Views  Result Date: 05/13/2016 CLINICAL DATA:  Constipation.  No bowel movement for 8 days. EXAM: ABDOMEN - 2 VIEW COMPARISON:  None. FINDINGS: No evidence of bowel obstruction. No visible significant increase in stool  burden. No organomegaly or suspicious calcification. No free air. IMPRESSION: No acute findings. Electronically Signed   By: Rolm Baptise M.D.   On: 05/13/2016 12:02   Dg Swallowing Func-speech Pathology  Result Date: 05/20/2016 Objective Swallowing Evaluation: Type of Study: MBS-Modified Barium Swallow Study Patient Details Name: Yvette Roark MRN: [id number] Date of Birth: 1934/04/25 Today's Date: 05/20/2016 Time: SLP Start Time (ACUTE ONLY): 1430-SLP Stop Time (ACUTE ONLY): 1500 SLP Time Calculation (min) (ACUTE ONLY): 30 min Past Medical History: Past Medical History: Diagnosis Date . Anxiety  . Arthritis   "hands" (01/06/2012) . Boil   "fluid boil left arm"  (01/30/2012) . CAD (coronary artery disease)   bypass graft surgery 05/2006; grafts were widely patent on cath 06/2010  . Chronic kidney disease  . COPD (chronic obstructive pulmonary disease) ([place]) 1993 . Deafness in left ear  . Diabetes mellitus, type 2 ([place]) 2000 . Elbow mass   left; "just noticed it ~ 2 wk ago"  (01/06/2012) . Foot fracture 1953; 1962  left; left . Hyperlipidemia 1993 . Hypertension 1993 . Ischemic cardiomyopathy   EF 25-30%; s/p single chamber ICD 2010; upgrade to [employer] BiV ICD 01/30/12 . LBBB (left bundle branch block)  . Myocardial infarction 2008; 01/06/2012 . Nightmares   with psych eval 2012 at [facility] . PTSD (post-traumatic stress disorder)  . Shortness of breath 01/05/2012  "all the time; been going on for awhile" . Syncope and collapse 01/05/2012 . Systolic heart failure   class II . Tinnitus of left ear  Past Surgical History: Past Surgical History: Procedure Laterality Date . APPENDECTOMY  1957 . BI-VENTRICULAR IMPLANTABLE CARDIOVERTER DEFIBRILLATOR UPGRADE N/A 01/30/2012  Procedure: BI-VENTRICULAR IMPLANTABLE CARDIOVERTER DEFIBRILLATOR UPGRADE;  Surgeon: Evans Lance, MD;  Location: [facility] LAB;  Service: Cardiovascular;  Laterality: N/A; . CARDIAC DEFIBRILLATOR PLACEMENT  01/30/2012  biventricular ICD . CARDIAC DEFIBRILLATOR PLACEMENT  01/30/2012  biventricular ICD . CORONARY ARTERY BYPASS GRAFT  2008  3 vessel, class 4 CHF, non Q wave MI 05/19/06 . [place]; ~ 1962  left; left . INSERT / REPLACE / REMOVE PACEMAKER  01/30/2012  biventricular ICD placed HPI: 82 y.o.malewith medical history significant for coronary artery disease (ischemic cardiomyopathy) status post CABG with last EF in 2017 of 35%, kidney diseasestage III,IDDM, admitted with pleural effusion, R pneumothorax s/p thoracentesis x2 this admission, fecal impaction. Weight loss. His wife reports constant coughing with meals. Persistent RLL opacity, potential chronic aspiration per PCCM, hence swallow  evaluation ordered.  Subjective: answers questions, wife present Assessment / Plan / Recommendation CHL IP CLINICAL IMPRESSIONS 05/20/2016 Therapy Diagnosis Mild oral phase dysphagia;Mild pharyngeal phase dysphagia Clinical Impression Pt presents with a mild dysphagia with intermittent, trace aspiration of thin liquids.  Aspiration in small amounts did not elicit a cough response; pt was unaware that it had occurred.  There were oral delays, discoordination during the oral preparatory phase of the swallow; weak tongue propulsion with solid food residue in valleculae.  Risk of aspiration was heightened when pt consumed solids/liquids simultaneously.  Esophageal sweep revealed retention of barium in esophageal column.   For now, recommend continuing current diet with basic precautions to minimize aspiration; will provide pt with specific exercises to address deficits.  SLP will follow.  Impact on safety and function Moderate aspiration risk   CHL IP TREATMENT RECOMMENDATION 05/20/2016 Treatment Recommendations Therapy as outlined in treatment plan below   No flowsheet data found. CHL IP DIET RECOMMENDATION 05/20/2016 SLP Diet Recommendations Regular solids;Thin liquid Liquid Administration via Cup;Straw  Medication Administration Whole meds with puree Compensations Slow rate;Small sips/bites Postural Changes Seated upright at 90 degrees   CHL IP OTHER RECOMMENDATIONS 05/20/2016 Recommended Consults -- Oral Care Recommendations Oral care BID Other Recommendations --   CHL IP FOLLOW UP RECOMMENDATIONS 05/20/2016 Follow up Recommendations Other (comment)   CHL IP FREQUENCY AND DURATION 05/20/2016 Speech Therapy Frequency (ACUTE ONLY) min 2x/week Treatment Duration 1 week      CHL IP ORAL PHASE 05/20/2016 Oral Phase Impaired Oral - Pudding Teaspoon -- Oral - Pudding Cup -- Oral - Honey Teaspoon -- Oral - Honey Cup -- Oral - Nectar Teaspoon -- Oral - Nectar Cup -- Oral - Nectar Straw -- Oral - Thin Teaspoon -- Oral - Thin Cup --  Oral - Thin Straw -- Oral - Puree Weak lingual manipulation;Lingual pumping;Reduced posterior propulsion;Piecemeal swallowing;Delayed oral transit Oral - Mech Soft Weak lingual manipulation;Lingual pumping;Reduced posterior propulsion;Piecemeal swallowing;Delayed oral transit Oral - Regular -- Oral - Multi-Consistency -- Oral - Pill -- Oral Phase - Comment --  CHL IP PHARYNGEAL PHASE 05/20/2016 Pharyngeal Phase Impaired Pharyngeal- Pudding Teaspoon -- Pharyngeal -- Pharyngeal- Pudding Cup -- Pharyngeal -- Pharyngeal- Honey Teaspoon -- Pharyngeal -- Pharyngeal- Honey Cup -- Pharyngeal -- Pharyngeal- Nectar Teaspoon -- Pharyngeal -- Pharyngeal- Nectar Cup -- Pharyngeal -- Pharyngeal- Nectar Straw -- Pharyngeal -- Pharyngeal- Thin Teaspoon -- Pharyngeal -- Pharyngeal- Thin Cup -- Pharyngeal -- Pharyngeal- Thin Straw Delayed swallow initiation-pyriform sinuses;Trace aspiration;Penetration/Aspiration during swallow;Reduced tongue base retraction;Reduced airway/laryngeal closure Pharyngeal Material enters airway, passes BELOW cords without attempt by patient to eject out (silent aspiration) Pharyngeal- Puree -- Pharyngeal -- Pharyngeal- Mechanical Soft -- Pharyngeal -- Pharyngeal- Regular -- Pharyngeal -- Pharyngeal- Multi-consistency -- Pharyngeal -- Pharyngeal- Pill -- Pharyngeal -- Pharyngeal Comment --  CHL IP CERVICAL ESOPHAGEAL PHASE 05/20/2016 Cervical Esophageal Phase (No Data) Pudding Teaspoon -- Pudding Cup -- Honey Teaspoon -- Honey Cup -- Nectar Teaspoon -- Nectar Cup -- Nectar Straw -- Thin Teaspoon -- Thin Cup -- Thin Straw -- Puree -- Mechanical Soft -- Regular -- Multi-consistency -- Pill -- Cervical Esophageal Comment -- No flowsheet data found. [name] 05/20/2016, 3:23 PM              US Thoracentesis Asp Pleural Space W/img Guide  Result Date: 05/16/2016 INDICATION: Coronary artery disease/prior CABG, chronic kidney disease, CHF, recurrent right pleural effusion. Request made for  diagnostic and therapeutic right thoracentesis. EXAM: ULTRASOUND GUIDED DIAGNOSTIC AND THERAPEUTIC RIGHT THORACENTESIS MEDICATIONS: None. COMPLICATIONS: None immediate. PROCEDURE: An ultrasound guided thoracentesis was thoroughly discussed with the patient and questions answered. The benefits, risks, alternatives and complications were also discussed. The patient understands and wishes to proceed with the procedure. Written consent was obtained. Ultrasound was performed to localize and mark an adequate pocket of fluid in the right chest. The area was then prepped and draped in the normal sterile fashion. 1% Lidocaine was used for local anesthesia. Under ultrasound guidance a Safe-T-Centesis catheter was introduced. Thoracentesis was performed. The catheter was removed and a dressing applied. FINDINGS: A total of approximately 1.8 liters of hazy, amber fluid was removed. Samples were sent to the laboratory as requested by the clinical team. IMPRESSION: Successful ultrasound guided diagnostic and therapeutic right thoracentesis yielding 1.8 liters of pleural fluid. Read by: Rowe Robert, PA-C Electronically Signed   By: Corrie Mckusick D.O.   On: 05/16/2016 14:02   US Thoracentesis Asp Pleural Space W/img Guide  Result Date: 05/14/2016 INDICATION: Patient with history of coronary artery disease, prior CABG, weight loss, chronic kidney  disease, CHF, prior smoker, large right pleural effusion. Request made for diagnostic and therapeutic right thoracentesis. EXAM: ULTRASOUND GUIDED DIAGNOSTIC AND THERAPEUTIC RIGHT THORACENTESIS MEDICATIONS: None. COMPLICATIONS: None immediate. PROCEDURE: An ultrasound guided thoracentesis was thoroughly discussed with the patient and questions answered. The benefits, risks, alternatives and complications were also discussed. The patient understands and wishes to proceed with the procedure. Written consent was obtained. Ultrasound was performed to localize and mark an adequate pocket of  fluid in the right chest. The area was then prepped and draped in the normal sterile fashion. 1% Lidocaine was used for local anesthesia. Under ultrasound guidance a Safe-T-Centesis catheter was introduced. Thoracentesis was performed. The catheter was removed and a dressing applied. FINDINGS: A total of approximately 2 liters of turbid, amber fluid was removed. Samples were sent to the laboratory as requested by the clinical team. IMPRESSION: Successful ultrasound guided diagnostic and therapeutic right thoracentesis yielding 2 liters of pleural fluid. Read by: Rowe Robert, PA-C Electronically Signed   By: Corrie Mckusick D.O.   On: 05/14/2016 13:02    (Echo, Carotid, EGD, Colonoscopy, ERCP)    Subjective: Patient feeling better, no nausea or vomiting, improved appetite, no dyspnea or cough.   Discharge Exam: Vitals:   05/22/16 2120 05/23/16 0617  BP: (!) 104/41 (!) 124/58  Pulse: 76 72  Resp: 16 14  Temp: 97.7 F (36.5 C) 98.2 F (36.8 C)   Vitals:   05/22/16 1641 05/22/16 1659 05/22/16 2120 05/23/16 0617  BP: (!) 110/55 (!) 124/43 (!) 104/41 (!) 124/58  Pulse: 75 79 76 72  Resp:  _0 Temp:  97.3 F (36.3 C) 97.7 F (36.5 C) 98.2 F (36.8 C)  TempSrc:  Oral Oral Oral  SpO2:  98% 95% 98%  Weight:      Height:        General: Pt is alert, awake, not in acute distress Cardiovascular: RRR, S1/S2 +, no rubs, no gallops Respiratory: CTA bilaterally, no wheezing, no rhonchi, mild decreased breath sounds at the right base.  Abdominal: Soft, NT, ND, bowel sounds + Extremities: no edema, no cyanosis    The results of significant diagnostics from this hospitalization (including imaging, microbiology, ancillary and laboratory) are listed below for reference.     Microbiology: Recent Results (from the past 240 hour(s))  Urine culture     Status: None   Collection Time: 05/13/16  1:16 PM  Result Value Ref Range Status   Specimen Description URINE, CLEAN CATCH  Final    Special Requests NONE  Final   Culture   Final    NO GROWTH Performed at [facility], [address] [place], [place] [address]    Report Status 05/15/2016 FINAL  Final  Blood culture (routine x 2)     Status: None   Collection Time: 05/13/16  6:00 PM  Result Value Ref Range Status   Specimen Description BLOOD LEFT HAND  Final   Special Requests BOTTLES DRAWN AEROBIC AND ANAEROBIC 5CC  Final   Culture   Final    NO GROWTH 5 DAYS Performed at [facility], [place] [address], [place], [place] [address]    Report Status 05/18/2016 FINAL  Final  Blood culture (routine x 2)     Status: None   Collection Time: 05/13/16  6:20 PM  Result Value Ref Range Status   Specimen Description BLOOD RIGHT HAND  Final   Special Requests IN PEDIATRIC BOTTLE [facility]  Final   Culture  Final    NO GROWTH 5 DAYS Performed at [facility], [place] [address] [place], [place] [address]    Report Status 05/18/2016 FINAL  Final  Culture, body fluid-bottle     Status: None   Collection Time: 05/14/16 11:29 AM  Result Value Ref Range Status   Specimen Description PLEURAL RIGHT  Final   Special Requests NONE  Final   Culture   Final    NO GROWTH 5 DAYS Performed at [place] [address] [place], [place] [address]    Report Status 05/19/2016 FINAL  Final  Gram stain     Status: None   Collection Time: 05/14/16 11:29 AM  Result Value Ref Range Status   Specimen Description PLEURAL RIGHT  Final   Special Requests NONE  Final   Gram Stain   Final    RARE WBC PRESENT,BOTH PMN AND MONONUCLEAR NO ORGANISMS SEEN Performed at [facility], [place] [address], [place], [place] [address]    Report Status 05/14/2016 FINAL  Final  Body fluid culture     Status: None   Collection Time: 05/16/16  1:30 PM  Result Value Ref Range Status   Specimen Description Pleural R  Final   Special Requests NONE  Final   Gram Stain   Final    RARE WBC PRESENT, PREDOMINANTLY MONONUCLEAR NO  ORGANISMS SEEN    Culture   Final    NO GROWTH 3 DAYS Performed at [facility], [place] [address], [place], [place] [address]    Report Status 05/20/2016 FINAL  Final  Culture, bal-quantitative     Status: None (Preliminary result)   Collection Time: 05/22/16 11:25 AM  Result Value Ref Range Status   Specimen Description BRONCHIAL ALVEOLAR LAVAGE  Final   Special Requests NONE  Final   Gram Stain   Final    FEW WBC PRESENT,BOTH PMN AND MONONUCLEAR NO ORGANISMS SEEN Performed at [facility], [address], [place], [place] [address]    Culture PENDING  Incomplete   Report Status PENDING  Incomplete     Labs: BNP (last 3 results)  Recent Labs  01/04/16 1538 04/14/16 1530 05/13/16 1800  BNP 453.0* 345.2* 704.8*   Basic Metabolic Panel:  Recent Labs Lab 05/17/16 0412 05/18/16 0400 05/20/16 1017 05/23/16 0428  NA 138 136 138 141  K 4.5 4.8 4.5 4.0  CL 101 98* 100* 103  CO2 _0 GLUCOSE 141* 174* 161* 152*  BUN 23* 25* 27* 30*  CREATININE 2.39* 2.36* 2.17* 2.16*  CALCIUM 9.1 9.2 9.6 9.4   Liver Function Tests: No results for input(s): AST, ALT, ALKPHOS, BILITOT, PROT, ALBUMIN in the last 168 hours. No results for input(s): LIPASE, AMYLASE in the last 168 hours.  Recent Labs Lab 05/17/16 1629  AMMONIA 26   CBC:  Recent Labs Lab 05/17/16 1031 05/18/16 0400 05/20/16 1017 05/23/16 0428  WBC 8.1 9.9 8.0 8.9  NEUTROABS  --   --   --  6.6  HGB 9.3* 9.2* 9.2* 9.0*  HCT 30.3* 29.8* 29.6* 29.3*  MCV 89.6 88.4 88.6 88.5  PLT 254 245 275 272   Cardiac Enzymes:  Recent Labs Lab 05/20/16 1645  TROPONINI 0.03*   BNP: Invalid input(s): POCBNP CBG:  Recent Labs Lab 05/22/16 0806 05/22/16 1337 05/22/16 1746 05/22/16 2144 05/23/16 0735  GLUCAP 140* 125* 222* 223* 184*   D-Dimer No results for input(s): DDIMER in the last 72  hours. Hgb A1c No results for input(s): HGBA1C in the last 72 hours. Lipid Profile No results for  input(s): CHOL, HDL, LDLCALC, TRIG, CHOLHDL, LDLDIRECT in the last 72 hours. Thyroid function studies No results for input(s): TSH, T4TOTAL, T3FREE, THYROIDAB in the last 72 hours.  Invalid input(s): FREET3 Anemia work up No results for input(s): VITAMINB12, FOLATE, FERRITIN, TIBC, IRON, RETICCTPCT in the last 72 hours. Urinalysis    Component Value Date/Time   COLORURINE YELLOW 05/13/2016 1316   APPEARANCEUR HAZY (A) 05/13/2016 1316   LABSPEC 1.013 05/13/2016 1316   PHURINE 6.0 05/13/2016 1316   GLUCOSEU NEGATIVE 05/13/2016 1316   GLUCOSEU 250 (A) 06/16/2014 0910   HGBUR NEGATIVE 05/13/2016 1316   BILIRUBINUR NEGATIVE 05/13/2016 1316   KETONESUR NEGATIVE 05/13/2016 1316   PROTEINUR 100 (A) 05/13/2016 1316   UROBILINOGEN 0.2 06/16/2014 0910   NITRITE NEGATIVE 05/13/2016 1316   LEUKOCYTESUR NEGATIVE 05/13/2016 1316   Sepsis Labs Invalid input(s): PROCALCITONIN,  WBC,  LACTICIDVEN Microbiology Recent Results (from the past 240 hour(s))  Urine culture     Status: None   Collection Time: 05/13/16  1:16 PM  Result Value Ref Range Status   Specimen Description URINE, CLEAN CATCH  Final   Special Requests NONE  Final   Culture   Final    NO GROWTH Performed at [facility], [place] [address] [place], [place] [address]    Report Status 05/15/2016 FINAL  Final  Blood culture (routine x 2)     Status: None   Collection Time: 05/13/16  6:00 PM  Result Value Ref Range Status   Specimen Description BLOOD LEFT HAND  Final   Special Requests BOTTLES DRAWN AEROBIC AND ANAEROBIC 5CC  Final   Culture   Final    NO GROWTH 5 DAYS Performed at [facility], [place] [address] [place], [place] [address]    Report Status 05/18/2016 FINAL  Final  Blood culture (routine x 2)     Status: None   Collection Time: 05/13/16  6:20 PM  Result Value Ref Range Status   Specimen Description BLOOD RIGHT HAND  Final   Special Requests IN PEDIATRIC BOTTLE 2CC  Final   Culture   Final    NO  GROWTH 5 DAYS Performed at [facility], [place] [address], [place], [place] [address]    Report Status 05/18/2016 FINAL  Final  Culture, body fluid-bottle     Status: None   Collection Time: 05/14/16 11:29 AM  Result Value Ref Range Status   Specimen Description PLEURAL RIGHT  Final   Special Requests NONE  Final   Culture   Final    NO GROWTH 5 DAYS Performed at [place] [address] [place], [place] [address]    Report Status 05/19/2016 FINAL  Final  Gram stain     Status: None   Collection Time: 05/14/16 11:29 AM  Result Value Ref Range Status   Specimen Description PLEURAL RIGHT  Final   Special Requests NONE  Final   Gram Stain   Final    RARE WBC PRESENT,BOTH PMN AND MONONUCLEAR NO ORGANISMS SEEN Performed at [facility], [place] [address] [place], [place] [address]    Report Status 05/14/2016 FINAL  Final  Body fluid culture     Status: None   Collection Time: 05/16/16  1:30 PM  Result Value Ref Range Status   Specimen Description Pleural R  Final   Special Requests NONE  Final   Gram Stain   Final    RARE WBC PRESENT, PREDOMINANTLY MONONUCLEAR NO ORGANISMS SEEN    Culture   Final    NO GROWTH 3 DAYS Performed at [facility], [address] [place], [place] [address]    Report Status 05/20/2016 FINAL  Final  Culture, bal-quantitative     Status: None (Preliminary result)   Collection Time: 05/22/16 11:25 AM  Result Value Ref Range Status   Specimen Description BRONCHIAL ALVEOLAR LAVAGE  Final   Special Requests NONE  Final   Gram Stain   Final    FEW WBC PRESENT,BOTH PMN AND MONONUCLEAR NO ORGANISMS SEEN Performed at [facility], [address] [place], [place] [address]    Culture PENDING  Incomplete   Report Status PENDING  Incomplete     Time coordinating discharge: 45 minutes  SIGNED:   Tawni Millers, MD  [facility] 05/23/2016, 9:36 AM Pager   If 7PM-7AM, please contact  night-coverage [email or web address] Password [email or web address]

## 2016-05-23 NOTE — Consult Note (Signed)
   [facility] CM Inpatient Consult   05/23/2016  L[place] Haub October 17, 1934 [id number]    [facility] Care Management follow up. Went to bedside to speak Mr. Chirico and wife. Spoke extensively about [place] Management program. Explained that [place] Management will not interfere or replace home health services. Mrs. Wishart endorses that SNF was recommended but patient started to cry about the mention of SNF placement. Therefore, Mrs.Hyneman will take Mr.Song home with the assistance of home health and neighbors. She is agreeable to [place] Management follow up and written consent was obtained.   Mrs. Hudgins states they are awaiting test results. Discussed that if they are told there is nothing that can be done than hospice services may be considered at later time. In the meantime. [facility] Care Management services are agreed upon. Mrs. Burpee states she has been in the process of looking for assistance with finding someone to build a ramp at their home. Discussed that writer could make a referral to [facility] LCSW as well to assist with this.   Will request for Mr. Drawdy to be assigned to [employer] and [facility] LCSW. He has [employer] and has multiple co-morbidities. Hussam Muniz (wife) will be primary contact person at [phone] (cell) and home number [phone]. Made covering inpatient RNCM aware that [place] Management will follow post discharge.    Marthenia Rolling, MSN-Ed, RN,BSN [facility] Liaison [phone]

## 2016-05-23 NOTE — Progress Notes (Signed)
[id number] ambulance service notified of transport need./[name].

## 2016-05-23 NOTE — Progress Notes (Signed)
SATURATION QUALIFICATIONS: (This note is used to comply with regulatory documentation for home oxygen)  Patient Saturations on Room Air at Rest = 87%  Patient Saturations on 3 Liters of oxygen while at Rest = 95%  Please briefly explain why patient needs home oxygen:

## 2016-05-23 NOTE — Progress Notes (Signed)
[facility] Attending Note  Presenting HPI:  82 y.o. male with history of tobacco use. Patient with  Rt pleural effusion status post thoracentesis 2 during this admission. FU CT s/o RLL mass +/- consolidation Presenting complaint cough with intermittent fever and weight loss for approximately 3 months. Patient has also had known coughing/dysphagia with meals.  Subjective:    Was more awake and interactive yesterday but again lethargic this a.m. on my rounds Afebrile Denies CP, dyspnea     Temp:  [97.3 F (36.3 C)-98.6 F (37 C)] 98.2 F (36.8 C) (02/16 0617) Pulse Rate:  [72-80] 72 (02/16 0617) Resp:  [9-30] 14 (02/16 0617) BP: (86-137)/(41-92) 124/58 (02/16 0617) SpO2:  [91 %-100 %] 98 % (02/16 0617)  Gen.: Awake.  No acute distress. Elderly on [place] Integument: Warm and dry. Without any rash or bruising on exposed skin. Pulmonary: Decreased breath sounds in right lung base. No accessory muscle use.  Cardiovascular: Regular rate. No appreciated JVD or edema. Abdomen: Soft. Protuberant. Grossly nontender. Neurological: follows commands. Non focal HEENT: Moist mucous membranes. No scleral icterus. Lymphatics: No appreciated cervical or supraclavicular lymphadenopathy.  CBC Latest Ref Rng & Units 05/23/2016 05/20/2016 05/18/2016  WBC 4.0 - 10.5 K/uL 8.9 8.0 9.9  Hemoglobin 13.0 - 17.0 g/dL 9.0(L) 9.2(L) 9.2(L)  Hematocrit 39.0 - 52.0 % 29.3(L) 29.6(L) 29.8(L)  Platelets 150 - 400 K/uL 272 275 245    BMP Latest Ref Rng & Units 05/23/2016 05/20/2016 05/18/2016  Glucose 65 - 99 mg/dL 152(H) 161(H) 174(H)  BUN 6 - 20 mg/dL 30(H) 27(H) 25(H)  Creatinine 0.61 - 1.24 mg/dL 2.16(H) 2.17(H) 2.36(H)  Sodium 135 - 145 mmol/L 141 138 136  Potassium 3.5 - 5.1 mmol/L 4.0 4.5 4.8  Chloride 101 - 111 mmol/L 103 100(L) 98(L)  CO2 22 - 32 mmol/L '30 31 31  '$ Calcium 8.9 - 10.3 mg/dL 9.4 9.6 9.2    IMAGING/STUDIES: Right Pleural Fluid Cytology 05/14/16:  No malignant  cells  Right Pleural Fluid Cytology 05/16/16: No malignant cells  CT CHEST W/O 05/18/16:   reviewed by me. Persistent right lower lobe opacity. Right-sided hydropneumothorax with some pleural thickening suspicious for trapped lung. No pathologic mediastinal adenopathy. No pericardial effusion.  MICROBIOLOGY: Blood Cultures x2 2/6:  Negative Urine Culture 2/6:  Negative  Right Pleural Fluid Culture 2/7:  Negative Right Pleural Fluid Culture 2/9 >>> Urine Streptococcal Antigen 2/6:  Negative   ANTIBIOTICS: Azithromycin 2/6 >>> (2/14 stop date) Rocephin 2/6 >>> (2/14 stop date)  ASSESSMENT/PLAN:  82 y.o. male with  chronic aspiration with RLL mass & effusion. Pleural fluid cytology  negative. Differential diagnosis includes aspiration pneumonia with consolidated lung versus lung cancer. I note right lower lobe consolidation on CXR 03/2015    1. Exudative right pleural effusion. 2. Right lower lobe  mass: Confirmed on bronchoscopy and biopsy, awaiting results 3. Probable chronic aspiration:  I discussed with wife bronchoscopy results reviewed images and explained prognosis for lung cancer. He will need PET scan as outpatient and if no mediastinal involvement and perhaps he may be a candidate for radiation to the right lower lobe mass. Would suggest discharged to rehabilitation facility if he is agreeable  Will need oncology input   Kara Mead MD. [facility]. [facility] Pager [phone] If no response call [phone]    10:20 AM 05/23/16

## 2016-05-23 NOTE — Progress Notes (Signed)
Physical Therapy Treatment Patient Details Name: May Ozment MRN: [id number] DOB: March 29, 1935 Today's Date: 05/23/2016    History of Present Illness 82 yo male admitted with pleural effusion, fecal impaction, R pneumothorax. Hx of CAD, CABG, DM, PTSD, anemia, COPD, MI, LBBB    PT Comments    Pt just returned from X ray confused.  "Why am I here".  Very slow to respond to direction and commands.  Assisted to EOB to increase alertness.  Assisted to [facility] + 2 assist.  Assisted NT with a wash up.  Then amb a limited distance using B platform EVA walker around to bed to the other side.  Slow processing.  Slow moving.  Assisted back to bed.   Follow Up Recommendations  SNF;Supervision/Assistance - 24 hour (spouse plans to take pt home because pt who is confused at times wants to go home)     Equipment Recommendations  Hospital bed;Wheelchair (measurements PT);3in1 (PT)    Recommendations for Other Services       Precautions / Restrictions Precautions Precautions: Fall Precaution Comments: "bad" L knee "bone on bone"  Restrictions Weight Bearing Restrictions: No Other Position/Activity Restrictions: WBAT    Mobility  Bed Mobility Overal bed mobility: Needs Assistance Bed Mobility: Supine to Sit     Supine to sit: Max assist;Total assist;+2 for physical assistance;+2 for safety/equipment     General bed mobility comments: + 2 assist to get to EOB.    Transfers Overall transfer level: Needs assistance Equipment used: 2 person hand held assist Transfers: Sit to/from [facility] Sit to Stand: Max assist;Total assist;+2 physical assistance;+2 safety/equipment         General transfer comment: assisted fro elevated bed to [facility] + 2 assist.  Then off BSC with 75% VC's and hand over hand cueing/direction.    Ambulation/Gait Ambulation/Gait assistance: Max assist;+2 physical assistance;+2 safety/equipment Ambulation Distance (Feet): 12 Feet Assistive device:  Bilateral platform walker Gait Pattern/deviations: Step-to pattern Gait velocity: very slow   General Gait Details: used EVA walker for increased support and needed that.  Excessive lean on B paded platform walker with very short steps and decreased WBing thru L LE due to his "bad knee".     Stairs            Wheelchair Mobility    Modified Rankin (Stroke Patients Only)       Balance                                    Cognition Arousal/Alertness:  (awake but delayed with all responces and mvts) Behavior During Therapy: Flat affect Overall Cognitive Status: Impaired/Different from baseline Area of Impairment: Orientation;Attention;Memory;Safety/judgement       Following Commands: Follows one step commands inconsistently       General Comments: spouse present and agrees with therapist, pt confused.  Required repeat VC's to complete task.      Exercises      General Comments        Pertinent Vitals/Pain Pain Assessment: No/denies pain    Home Living                      Prior Function            PT Goals (current goals can now be found in the care plan section) Progress towards PT goals: Progressing toward goals    Frequency    Min 3X/week  PT Plan Current plan remains appropriate    Co-evaluation             End of Session Equipment Utilized During Treatment: Gait belt Activity Tolerance: Patient limited by fatigue Patient left: in bed;with bed alarm set;with family/visitor present     Time: 8828-0034 PT Time Calculation (min) (ACUTE ONLY): 31 min  Charges:  $Gait Training: 8-22 mins $Therapeutic Activity: 8-22 mins                    G Codes:      Rica Koyanagi  PTA [facility]  Acute  Rehab Pager      [phone]

## 2016-05-23 NOTE — Progress Notes (Signed)
Date: May 23, 2016 Discharge orders checked for needs. Home 02 needs called to [place]. Velva Harman, RN, BSN, [place]   [phone]

## 2016-05-23 NOTE — Progress Notes (Signed)
Discharged home with [facility]. I called Ms Jonetta Osgood to let her know that he would be arriving soon

## 2016-05-23 NOTE — Progress Notes (Signed)
[id number] ambulatory patient going to home via ptar

## 2016-05-23 NOTE — Care Management Important Message (Signed)
Important Message  Patient Details  Name: Demondre Aguas MRN: [id number] Date of Birth: October 19, 1934   Medicare Important Message Given:  Yes    Kerin Salen 05/23/2016, 11:17 AMImportant Message  Patient Details  Name: Eliberto Sole MRN: [id number] Date of Birth: [birth year]   Medicare Important Message Given:  Yes    Kerin Salen 05/23/2016, 11:17 AM

## 2016-05-23 NOTE — Progress Notes (Signed)
Date: May 23, 2016 After comparing hhc cost with several companies wife has decided to stay with [facility] notified of this for home care. Velva Harman, RN, BSN, [place]   [phone]

## 2016-05-24 LAB — CULTURE, BAL-QUANTITATIVE W GRAM STAIN: Culture: 50000 — AB

## 2016-05-24 LAB — CULTURE, BAL-QUANTITATIVE

## 2016-05-25 DIAGNOSIS — R531 Weakness: Secondary | ICD-10-CM | POA: Diagnosis not present

## 2016-05-25 DIAGNOSIS — Z5181 Encounter for therapeutic drug level monitoring: Secondary | ICD-10-CM | POA: Diagnosis not present

## 2016-05-25 DIAGNOSIS — J449 Chronic obstructive pulmonary disease, unspecified: Secondary | ICD-10-CM | POA: Diagnosis not present

## 2016-05-25 DIAGNOSIS — I509 Heart failure, unspecified: Secondary | ICD-10-CM | POA: Diagnosis not present

## 2016-05-25 DIAGNOSIS — R262 Difficulty in walking, not elsewhere classified: Secondary | ICD-10-CM | POA: Diagnosis not present

## 2016-05-26 ENCOUNTER — Encounter: Payer: Self-pay | Admitting: *Deleted

## 2016-05-26 ENCOUNTER — Telehealth: Payer: Self-pay | Admitting: *Deleted

## 2016-05-26 ENCOUNTER — Telehealth: Payer: Self-pay | Admitting: Family Medicine

## 2016-05-26 ENCOUNTER — Other Ambulatory Visit: Payer: Self-pay | Admitting: *Deleted

## 2016-05-26 DIAGNOSIS — J90 Pleural effusion, not elsewhere classified: Secondary | ICD-10-CM

## 2016-05-26 DIAGNOSIS — R262 Difficulty in walking, not elsewhere classified: Secondary | ICD-10-CM | POA: Diagnosis not present

## 2016-05-26 DIAGNOSIS — R531 Weakness: Secondary | ICD-10-CM | POA: Diagnosis not present

## 2016-05-26 DIAGNOSIS — I509 Heart failure, unspecified: Secondary | ICD-10-CM | POA: Diagnosis not present

## 2016-05-26 DIAGNOSIS — J449 Chronic obstructive pulmonary disease, unspecified: Secondary | ICD-10-CM | POA: Diagnosis not present

## 2016-05-26 DIAGNOSIS — Z5181 Encounter for therapeutic drug level monitoring: Secondary | ICD-10-CM | POA: Diagnosis not present

## 2016-05-26 NOTE — Patient Outreach (Signed)
[facility]) Care Management [place] Telephone Outreach, Transition of Care day 1  05/26/2016  [name] [name] 08/01/1934 [id number]  Successful telephone outreach to [name] [name], wife/ caregiver (on [facility] written consent) of [name] [name], 82 y/o male referred to [facility] CM for transition of care after recent hospitalization February 6-16, 2018 for CAP/ hypoxic respiratory failure with (R) pleural effusion.  Patient was noted to have recent weight loss of 3-40 pounds over last 3 months and (R) lower lung mass was identified during recent hospitalization.  Patient has history including, but not limited to CAD with NSTEMI, HTN, sCHF with EF 30-35%, PAD, COPD/ OSA, cardiomyopathy, DM, and GERD.  Patient was discharged home with home health [facility]) services through [place] after refusing SNF placement.  HIPAA/ identity verified by wife/ caregiver during phone call today.  [place] services were explained to patient's wife/ caregiver, and verbal consent for [facility] CM services was obtained.  Today, [name] [name] reports that patient "is doing pretty good" since his discharge home from the hospital.  -- Medications:  Reports has all medications and is taking with assistance of wife/ caregiver, who prepares all of patient's medications for him.  Wife verbalized accurate understanding of the general purpose, scheduling, and dosing of all medications, and denies questions around current medications. During review of hospital discharge instructions/ AVS, wife reported that she was unaware of the changes that had been made to his medications at the time of hospital discharge.  Together, [name] [name] and I went through each of patient's medications, and the medication changes at hospital discharge were reviewed with patient's caregiver; specifically, to decrease carvidolol (now 12.5 mg po BID), and to discontinue tylenol, Keflex, and spironolactone.  Patient was recently discharged from hospital and  all medications were thoroughly reviewed with patient's caregiver during phone call today.    -- Provider appointments:  Caregiver/ wife [name] [name] accurate reporting of upcoming scheduled provider appointments with oncologist.  [name] [name] denies that she will place a call to schedule PCP appointment, stating, "I took him to see his PCP in January when he started losing all this weight, and nothing was done.... I have no intention of contacting his PCP, because I don't think he will do anything for" the patient.  I encouraged [name] [name] to consider making a PCP appointment, and to contact PCP for any new concerns, issues, or problems, but [name] [name] continually said that she "would take patient back to the hospital before" she would call patient's PCP.  [name] [name] stated that she intends to contact [facility] services to transport patient to upcoming oncology appointment 05/29/16, as she reports she is unable to get patient into/ out of her car.  -- [employer] needs:  Patient and his wife/ caregiver live at their home without other family present.  [name] [name] reports that her neighbors help out as allowed, but states that she believes she will need "more assistance in the coming days," as patient is now completely bedridden and is reliant on others for his total care.  Specific needs voiced by wife include transportation and assistance with obtaining a ramp to their home so patient can attend provider appointments.  I explained that [place] referral had been placed, and that [name] [name] would be hearing from [place], to which she verbalized understanding and agreement.  -- Safety/ Falls:  [name] [name] reports that patient is unable to bear weight or "do anything for himself," and states that she has to get him out of bed, dressed, bathed,  fed, etc by herself.  Reports that patient now has a hospital bed, home oxygen, and a wheelchair.  Fall risks and fall prevention education was provided to/ discussed with [name] [name]  today  -- Advanced Directive Planning:  Reports no Advanced Directives in place at this time; denies desire for additional information during today's phone call.  -- Self-health management of chronic disease state of CHF:  Currently reports that patient is unable to monitor/ record daily weights, as he is unable to bear weight or stand by himself. General signs/ symptoms CHF discussed with patient's caregiver, and [name] [name] reports that patient does not currently have any noticeable LE swelling or shortness of breath "outside of his normal."  Reasons to contact EMS were discussed with patient's caregiver today.  General use of O2 at home was discussed with patient's wife/ caregiver.  Wife/ caregiver [name] [name] denies further issues, concerns, or problems today. I provided her withmy direct phone number, the main [facility] CM office phone number, and the [facility] CM 24-hour nurse advice phone number should issues arise prior to next scheduled [place] outreach.  Discussed with [name] [name] that I would contact her by phone next week around patient's scheduled provider appointments to schedule [facility] CM initial home visit, and encouraged her to contact me as needed. 60-minute phone call  Plan:  Patient will continue to take medications as prescribed and attend all scheduled provider appointments.  Patient will actively participate with home health [facility]) services for nursing, PT, and bath aide, as ordered post-hospital discharge.  Patient/ caregiver will contact patient's providers for any new concerns, questions, problems, or issues.   [place] involvement for transition of care to continue with telephone outreach scheduled for next week.  I will make patient's PCP aware of [facility] CM involvement in patient's care.  Oneta Rack, RN, BSN, [employer] [facility] Care Management  [phone]

## 2016-05-26 NOTE — Telephone Encounter (Signed)
I'm okay signing the orders.  Thanks.

## 2016-05-26 NOTE — Telephone Encounter (Signed)
[name] [name] returned [name]'s call.  I let [name] [name] know Dr.Duncan said he would sign the orders.

## 2016-05-26 NOTE — Telephone Encounter (Signed)
Unable to reach patient at time of TCM Call. Left message for patient to return call when available.  

## 2016-05-26 NOTE — Telephone Encounter (Signed)
Inez Catalina called to let Dr. Damita Dunnings know that pt was admitted to home health services yesterday and wanted Dr. Damita Dunnings to sign the home health orders.  Can you please call her at  [phone]

## 2016-05-26 NOTE — Telephone Encounter (Signed)
Oncology Nurse Navigator Documentation  Oncology Nurse Navigator Flowsheets 05/26/2016  Navigator Location [facility]  Referral date to RadOnc/MedOnc 05/26/2016  Navigator Encounter Type Telephone/I received referral today.  I called and spoke with Ms. Kindt.  I gave her the appt time and place.  Appt on 05/29/16 with labs at 1:30 and to see Dr. Julien Nordmann at 2:00.  She verbalized understanding of appt time and place.   Telephone Outgoing Call  Treatment Phase Pre-Tx/Tx Discussion  Barriers/Navigation Needs Coordination of Care  Interventions Coordination of Care  Coordination of Care Appts  Acuity Level 1  Acuity Level 1 Initial guidance, education and coordination as needed  Time Spent with Patient 15

## 2016-05-26 NOTE — Telephone Encounter (Signed)
Left message on voice mail  to call back

## 2016-05-27 ENCOUNTER — Other Ambulatory Visit: Payer: Self-pay | Admitting: Family Medicine

## 2016-05-27 NOTE — Telephone Encounter (Signed)
Sent. Thanks.   

## 2016-05-27 NOTE — Telephone Encounter (Signed)
Received refill request electronically Called and spoke to patient's wife and was advised that they did request the refill on the antibiotic. [name] [name] stated that the patient has the same problem with the rash around his mouth and this was given to him on 04/08/16 and it helped a lot. [name] [name] stated that she has been using cortisone cream on it and it has not been helping. Patient's wife stated that her husband has been diagnosed with cancer and they are waiting on the PET scan results to find out more.

## 2016-05-27 NOTE — Telephone Encounter (Signed)
Patient's wife notified by telephone that script has been sent to the pharmacy as requested.

## 2016-05-28 NOTE — Telephone Encounter (Signed)
See [facility] call dated for 05/26/16. Spouse declined PCP follow-up.

## 2016-05-28 NOTE — Telephone Encounter (Signed)
I'll await input/PET results.  Thanks.

## 2016-05-29 ENCOUNTER — Encounter: Payer: Self-pay | Admitting: *Deleted

## 2016-05-29 ENCOUNTER — Other Ambulatory Visit (HOSPITAL_COMMUNITY)
Admission: RE | Admit: 2016-05-29 | Discharge: 2016-05-29 | Disposition: A | Discharge status: Home / Self Care | Payer: PPO | Source: Ambulatory Visit | Attending: Internal Medicine | Admitting: Internal Medicine

## 2016-05-29 ENCOUNTER — Ambulatory Visit (HOSPITAL_BASED_OUTPATIENT_CLINIC_OR_DEPARTMENT_OTHER): Payer: PPO | Admitting: Internal Medicine

## 2016-05-29 ENCOUNTER — Other Ambulatory Visit: Payer: Self-pay | Admitting: *Deleted

## 2016-05-29 ENCOUNTER — Encounter: Payer: Self-pay | Admitting: Internal Medicine

## 2016-05-29 ENCOUNTER — Other Ambulatory Visit: Payer: PPO

## 2016-05-29 DIAGNOSIS — Z7409 Other reduced mobility: Secondary | ICD-10-CM | POA: Diagnosis not present

## 2016-05-29 DIAGNOSIS — R634 Abnormal weight loss: Secondary | ICD-10-CM

## 2016-05-29 DIAGNOSIS — R531 Weakness: Secondary | ICD-10-CM

## 2016-05-29 DIAGNOSIS — Z87891 Personal history of nicotine dependence: Secondary | ICD-10-CM

## 2016-05-29 DIAGNOSIS — Z8052 Family history of malignant neoplasm of bladder: Secondary | ICD-10-CM

## 2016-05-29 DIAGNOSIS — C3431 Malignant neoplasm of lower lobe, right bronchus or lung: Secondary | ICD-10-CM | POA: Insufficient documentation

## 2016-05-29 DIAGNOSIS — R4182 Altered mental status, unspecified: Secondary | ICD-10-CM | POA: Diagnosis not present

## 2016-05-29 DIAGNOSIS — C3491 Malignant neoplasm of unspecified part of right bronchus or lung: Secondary | ICD-10-CM | POA: Insufficient documentation

## 2016-05-29 DIAGNOSIS — C801 Malignant (primary) neoplasm, unspecified: Secondary | ICD-10-CM | POA: Diagnosis not present

## 2016-05-29 HISTORY — DX: Malignant neoplasm of unspecified part of right bronchus or lung: C34.91

## 2016-05-29 NOTE — Progress Notes (Signed)
Oncology Nurse Navigator Documentation  Oncology Nurse Navigator Flowsheets 05/29/2016  Navigator Location [facility]  Navigator Encounter Type Other/per cancer conference discussion this am, RLL squmaous cell tissue was requested to be sent for PDL 1 testing.  I updated [facility] pathology dept of request.   Treatment Phase Pre-Tx/Tx Discussion  Barriers/Navigation Needs Coordination of Care  Interventions Coordination of Care  Coordination of Care Other  Acuity Level 2  Acuity Level 2 Other  Time Spent with Patient 15

## 2016-05-29 NOTE — Patient Outreach (Signed)
[facility]) Care Management [place] Telephone Outreach, Care Coordination  05/29/2016  [name] [birth year] [id number]  Successful incoming telephone outreach from [place] Blizzard, Oncology Navigator [phone]) re: [name] [name], 82 y/o male referred to [facility] CM for transition of care after recent hospitalization February 6-16, 2018 for CAP/ hypoxic respiratory failure with (R) pleural effusion.  Patient was noted to have recent weight loss of 3-40 pounds over last 3 months and (R) lower lung mass was identified during recent hospitalization.  Patient has history including, but not limited to CAD with NSTEMI, HTN, sCHF with EF 30-35%, PAD, COPD/ OSA, cardiomyopathy, DM, and GERD.  Patient was discharged home with home health [facility]) services through [place] after refusing SNF placement.   Hinton Dyer placed today to inquire if I was aware of medical transportation resources that would transport patient on stretcher to/ from his home to provider appointments; I shared with [employer] for [employer], acknowledging that I am unaware if they do stretcher transport; we discussed safety issues around patient using friends/ family to put him in and out of his personal vehicle for his wife to transport him.  To my current knowledge, [facility] transportation is the safest mode of transportation for this patient, whose wife/ caregiver reported to me previously this week cannot ambulate without assistance.  Hinton Dyer shared with me that patient has multiple upcoming oncology provider/ treatment appointments scheduled today during oncology appointment, due to his newly diagnosed lung cancer; one of these appointments is in conflict with my previously scheduled [facility] [name] [name] CM initial home visit with patient;  I agreed to cancel my home visit with patient and re-schedule as allowed by patient's schedule.  Plan:  Will cancel previously scheduled [facility] [name] [name] CM initial home visit and  re-schedule as allowed around patient's/ caregiver schedule.  Will collaborate with oncology navigator as indicated for patient care needs.  [name] Rack, [name] [name], BSN, [employer] [facility] Care Management  [phone]

## 2016-05-29 NOTE — Progress Notes (Signed)
I called Richarda Osmond RN with [facility] to update and ask about transportation for [name] [name].  She is not aware of any transportation service that will transport stretcher except [facility].  I also asked her if she could change her appt with patient due to him needing to see Rad Onc. She was ok with this.

## 2016-05-29 NOTE — Progress Notes (Signed)
[name] [name] [phone]   [phone]  CONSULT NOTE  REFERRING PHYSICIAN: Dr. Kara Mead  REASON FOR CONSULTATION:  82 years old white male recently diagnosed with lung cancer.  HPI [name] [name] is a 82 y.o. male was past medical history significant for COPD, congestive heart failure, coronary heart disease status post CABG in 2008, chronic kidney disease, diabetes mellitus, anemia, and dyslipidemia. He came to the clinic today on the stretcher and transported by EMS for evaluation and recommendation regarding his recently diagnosed lung cancer. The patient mentions that presented to the emergency department on 05/13/2016 complaining of fecal impaction and urinary retention. He had CT scan of the abdomen and pelvis without contrast at that time which showed large right pleural effusion with right lower lobe and middle lobe consolidation. There are varying densities seen within the collapsed right lung with the possibility of neoplasm could not be excluded. CT scan of the chest without contrast on 05/13/2016 showed large right pleural effusion with compressive atelectasis. Underlying malignancy is not entirely excluded. On 05/14/2016 the patient underwent ultrasound-guided right thoracentesis with drainage of 2.0 L of turbid amber fluid. The cytology was negative for malignant cells. Repeat CT scan of the chest without contrast on 05/18/2016 showed small anterior right pneumothorax following the thoracentesis. There was decrease in the pleural effusion and persistent right lower lobe mass occupying the near entirety of the right lower lobe measuring up to 10.0 cm. This was suspicious for pneumonia versus carcinoma. CT of the head without contrast on the same day showed no evidence for metastatic disease to the brain. The patient could not have contrast or MRI of the brain because he has a pacemaker and defibrillator. The patient was seen by Dr. Elsworth Soho and on  05/22/2016 he underwent bronchoscopy. The final cytology from the bronchial brushing of the right lower lobe showed malignant cells consistent with squamous cell carcinoma. Dr. Elsworth Soho kindly referred the patient to me today for evaluation and recommendation regarding treatment of his condition. When seen today the patient came on a stretcher by EMS. He has difficulty walking and ambulating on his own. He denied having any chest pain but has shortness breath baseline increased with exertion and he is currently on home oxygen. He also has cough with no hemoptysis. He lost around 30 pounds in the last 3 months. He has no nausea, vomiting, diarrhea or constipation. He denied having any back pain. His weakness is generalized. Family history significant for father with bladder cancer, mother died from old age. The patient is married and has no children. He was accompanied by his wife [name] [name]. He used to work as a Immunologist. He has a history of smoking 1 pack per day for around 50 years. He quit 1 year ago. He also has a history of alcohol abuse but not recently. He has no history of drug abuse.  HPI  Past Medical History:  Diagnosis Date  . Anxiety   . Arthritis    "hands" (01/06/2012)  . Boil    "fluid boil left arm" (01/30/2012)  . CAD (coronary artery disease)    bypass graft surgery 05/2006; grafts were widely patent on cath 06/2010   . Chronic kidney disease   . COPD (chronic obstructive pulmonary disease) ([place]) 1993  . 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 Diabetes mellitus, type 2 ([place]) 2000  . Elbow mass    left; "just noticed it ~ 2 wk ago"  (01/06/2012)  . Foot fracture  1953; 1962   left; left  . Hyperlipidemia 1993  . Hypertension 1993  . Ischemic cardiomyopathy    EF 25-30%; s/p single chamber ICD 2010; upgrade to Medtronic BiV ICD 01/30/12  . LBBB (left bundle branch block)   . Myocardial infarction 2008; 01/06/2012  . Nightmares    with psych eval 2012 at [facility]  . PTSD (post-traumatic stress  disorder)   . Shortness of breath 01/05/2012   "all the time; been going on for awhile"  . Stage III squamous cell carcinoma of right lung (HCC) 05/29/2016  . Syncope and collapse 01/05/2012  . Systolic heart failure    class II  . Tinnitus of left ear     Past Surgical History:  Procedure Laterality Date  . APPENDECTOMY  1957  . BI-VENTRICULAR IMPLANTABLE CARDIOVERTER DEFIBRILLATOR UPGRADE N/A 01/30/2012   Procedure: BI-VENTRICULAR IMPLANTABLE CARDIOVERTER DEFIBRILLATOR UPGRADE;  Surgeon: Marinus Maw, MD;  Location: [facility] CATH LAB;  Service: Cardiovascular;  Laterality: N/A;  . CARDIAC DEFIBRILLATOR PLACEMENT  01/30/2012   biventricular ICD  . CARDIAC DEFIBRILLATOR PLACEMENT  01/30/2012   biventricular ICD  . CORONARY ARTERY BYPASS GRAFT  2008   3 vessel, class 4 CHF, non Q wave MI 05/19/06  . FRACTURE SURGERY  1953; ~ 1962   left; left  . INSERT / REPLACE / REMOVE PACEMAKER  01/30/2012   biventricular ICD placed  . VIDEO BRONCHOSCOPY Bilateral 05/22/2016   Procedure: VIDEO BRONCHOSCOPY WITH FLUORO;  Surgeon: Oretha Milch, MD;  Location: [facility] ENDOSCOPY;  Service: Cardiopulmonary;  Laterality: Bilateral;    Family History  Problem Relation Age of Onset  . Heart disease Father     MI age 89  . Prostate cancer Father   . Diabetes Sister   . Hyperlipidemia Sister   . Cancer Maternal Grandmother     liver, cirrhosis  . Depression Neg Hx   . Alcohol abuse Neg Hx   . Drug abuse Neg Hx   . Stroke Neg Hx   . Colon cancer Neg Hx     Social History Social History  Substance Use Topics  . Smoking status: Former Smoker    Packs/day: 0.50    Years: 35.00    Types: Cigarettes  . Smokeless tobacco: Never Used     Comment: 01/06/2012 "quit smoking in 2008; slip and have one ocasionally still"  . Alcohol use 4.2 oz/week    7 Glasses of wine per week     Comment: 01/06/2012 "big bottle of wine at least 3 nights/wk; 6 pack pretty much q night"; 01/30/2012 "maybe a glass of wine q hs  since left hospital last"". 06/2013 "a beer or two a week"    Allergies  Allergen Reactions  . Sulfa Antibiotics Swelling and Rash    Hands swell    Current Outpatient Prescriptions  Medication Sig Dispense Refill  . amLODipine (NORVASC) 5 MG tablet Take 5 mg by mouth at bedtime.     Marland Kitchen aspirin 81 MG tablet Take 81 mg by mouth at bedtime.     . carvedilol (COREG) 12.5 MG tablet Take 1 tablet (12.5 mg total) by mouth 2 (two) times daily with a meal. 60 tablet 0  . cephALEXin (KEFLEX) 500 MG capsule TAKE 1 CAPSULE BY MOUTH 4 TIMES DAILY 28 capsule 0  . diazepam (VALIUM) 10 MG tablet TAKE 1/2 TO 1 TABLET BY MOUTH AT BEDTIMEAS NEEDED FOR ANXIETY OR RINGING IN EARS 30 tablet 1  . ferrous sulfate 325 (65 FE) MG tablet TAKE  1 TABLET BY MOUTH ONCE A DAY WITH BREAKFAST (Patient not taking: Reported on 05/26/2016) 90 tablet 1  . Insulin Disposable Pump (V-GO 20) KIT USE DAILY AS DIRECTED 30 kit 3  . Insulin Pen Needle (PEN NEEDLES) 31G X 5 MM MISC Inject 15 Units as directed 2 (two) times daily. Use pen needles for pt insulin pen. Dx E11.29 100 each 5  . Multiple Vitamins-Minerals (BONE SMART PO) Take 1 tablet by mouth daily.    . nitroGLYCERIN (NITROSTAT) 0.4 MG SL tablet Place 1 tablet (0.4 mg total) under the tongue every 5 (five) minutes as needed. For chest pain 25 tablet 11  . NOVOLOG 100 UNIT/ML injection USE MAXIMUM OF 56 UNITS PER DAY WITH V-GO PUMP 20 mL 2  . Nutritional Supplements (QUINOA KALE & HEMP PO) Take by mouth daily.    Glory Rosebush DELICA LANCETS FINE MISC Use to check blood sugar 3 times per day dx code 250.42 100 each 3  . ONETOUCH VERIO test strip USE AS DIRECTED TO CHECK BLOOD SUGAR THREE TIMES PER DAY 100 each 3  . pregabalin (LYRICA) 25 MG capsule Take 1 capsule (25 mg total) by mouth 2 (two) times daily. 30 capsule 3  . rosuvastatin (CRESTOR) 40 MG tablet TAKE 1 TABLET BY MOUTH ONCE A DAY 90 tablet 3  . torsemide (DEMADEX) 20 MG tablet Take 1 tablet (20 mg total) by mouth  every other day. 15 tablet 3  . VICTOZA 18 MG/3ML SOPN Inject 0.2 mLs (1.2 mg total) into the skin daily. Inject once daily at the same time (Patient taking differently: Inject 1.2 mg into the skin daily. Only takes when blood sugar is high) 2 pen 3   No current facility-administered medications for this visit.     Review of Systems  Constitutional: positive for anorexia, fatigue and weight loss Eyes: negative Ears, nose, mouth, throat, and face: negative Respiratory: positive for cough and dyspnea on exertion Cardiovascular: negative Gastrointestinal: negative Genitourinary:negative Integument/breast: negative Hematologic/lymphatic: negative Musculoskeletal:positive for muscle weakness Neurological: negative Behavioral/Psych: negative Endocrine: negative Allergic/Immunologic: negative  Physical Exam  FYB:OFBPZ, healthy, no distress, well nourished and well developed SKIN: skin color, texture, turgor are normal, no rashes or significant lesions HEAD: Normocephalic, No masses, lesions, tenderness or abnormalities EYES: normal, PERRLA, EOMI EARS: External ears normal, Canals clear OROPHARYNX:no exudate, no erythema and lips, buccal mucosa, and tongue normal  NECK: supple, no adenopathy, no JVD LYMPH:  no palpable lymphadenopathy, no hepatosplenomegaly LUNGS: decreased breath sounds HEART: regular rate & rhythm, no murmurs and no gallops ABDOMEN:abdomen soft, non-tender, obese, normal bowel sounds and no masses or organomegaly BACK: Back symmetric, no curvature., No CVA tenderness EXTREMITIES:no joint deformities, effusion, or inflammation, no edema, no skin discoloration  NEURO: alert & oriented x 3 with fluent speech, no focal motor/sensory deficits  PERFORMANCE STATUS: ECOG 1  LABORATORY DATA: Lab Results  Component Value Date   WBC 8.9 05/23/2016   HGB 9.0 (L) 05/23/2016   HCT 29.3 (L) 05/23/2016   MCV 88.5 05/23/2016   PLT 272 05/23/2016      Chemistry        Component Value Date/Time   NA 141 05/23/2016 0428   NA 139 08/02/2014 0747   K 4.0 05/23/2016 0428   K 3.6 08/02/2014 0747   CL 103 05/23/2016 0428   CL 106 08/02/2014 0747   CO2 30 05/23/2016 0428   CO2 25 08/02/2014 0747   BUN 30 (H) 05/23/2016 0428   BUN 28 (H) 08/02/2014 0747  CREATININE 2.16 (H) 05/23/2016 0428   CREATININE 2.14 (H) 07/11/2015 1415      Component Value Date/Time   CALCIUM 9.4 05/23/2016 0428   CALCIUM 8.1 (L) 08/02/2014 0747   ALKPHOS 42 05/14/2016 0433   ALKPHOS 56 08/01/2014 1408   AST 13 (L) 05/14/2016 0433   AST 17 08/01/2014 1408   ALT 12 (L) 05/14/2016 0433   ALT 12 (L) 08/01/2014 1408   BILITOT 0.4 05/14/2016 0433   BILITOT 0.6 08/01/2014 1408       RADIOGRAPHIC STUDIES: Ct Abdomen Pelvis Wo Contrast  Result Date: 05/13/2016 CLINICAL DATA:  Abdominal pain and constipation EXAM: CT ABDOMEN AND PELVIS WITHOUT CONTRAST TECHNIQUE: Multidetector CT imaging of the abdomen and pelvis was performed following the standard protocol without IV contrast. COMPARISON:  None. FINDINGS: Lower chest: Left lung base is clear. The right lung base demonstrates a large pleural effusion with complete consolidation of the right lower lobe. Patchy density is noted within the collapsed lung. The possibility of underlying neoplasm could not be totally excluded on the basis of this exam. Hepatobiliary: Dependent small gallstones are noted. The gallbladder and liver are otherwise within normal limits. Pancreas: Unremarkable. No pancreatic ductal dilatation or surrounding inflammatory changes. Spleen: Normal in size without focal abnormality. Adrenals/Urinary Tract: Adrenal glands are within normal limits. Scattered renal vascular calcifications are seen. A few nonobstructing stones are noted measuring less than 5 mm. The ureters are well visualized bilaterally. The bladder has been decompressed by Foley catheter. Stomach/Bowel: The appendix has been surgically removed. Mild fecal  material is noted within the colon although no findings to suggest, significant constipation or obstruction are noted. No inflammatory changes are seen. Vascular/Lymphatic: Aortic atherosclerosis. No enlarged abdominal or pelvic lymph nodes. Reproductive: Prostate is mildly enlarged with diffuse calcifications. Other: No abdominal wall hernia or abnormality. No abdominopelvic ascites. Musculoskeletal: No acute or significant osseous findings. IMPRESSION: Large right pleural effusion with right lower lobe and middle lobe consolidation. Varying densities are seen within the collapsed right lung. Possibility of neoplasm could not be totally excluded. Small gallstones without complicating factors. Small bilateral nonobstructing renal stones measuring less than 5 mm. Electronically Signed   By: Inez Catalina M.D.   On: 05/13/2016 16:15   Dg Chest 1 View  Result Date: 05/16/2016 CLINICAL DATA:  Status post right-sided thoracentesis. EXAM: CHEST 1 VIEW COMPARISON:  Study obtained earlier in the day. FINDINGS: Most of the right pleural effusion noted previously has been removed. No pneumothorax. A small amount of pleural effusion remains on the right. There is consolidation in the right base medially. Left lung is clear. There is apparent eventration of the right hemidiaphragm. Heart is mildly enlarged with pulmonary vascularity within normal limits. Pacemaker leads are attached to the right atrium, right ventricle, and coronary sinus. No adenopathy. There is atherosclerotic calcification in the aorta. No bone lesions. IMPRESSION: No pneumothorax post thoracentesis. Small amount of residual right pleural effusion as well as right base consolidation. Eventration right hemidiaphragm. Left lung clear. Stable cardiac prominence. There is aortic atherosclerosis Electronically Signed   By: Lowella Grip III M.D.   On: 05/16/2016 14:17   Dg Chest 1 View  Result Date: 05/14/2016 CLINICAL DATA:  Right thoracentesis EXAM:  CHEST 1 VIEW COMPARISON:  CT chest of 05/13/2016 and chest x-ray of 05/13/2016 FINDINGS: After right thoracentesis, the volume of right pleural effusion has diminished. There is a tiny right apical pneumothorax present of less than 5%. The left lung is clear. Cardiomegaly is stable and  AICD leads remain. IMPRESSION: 1. Tiny right apical pneumothorax after right thoracentesis. 2. Decrease in volume of right pleural effusion. Electronically Signed   By: Ivar Drape M.D.   On: 05/14/2016 11:47   Dg Chest 2 View  Result Date: 05/23/2016 CLINICAL DATA:  Cough EXAM: CHEST  2 VIEW COMPARISON:  05/19/2016 FINDINGS: Small right pleural effusion. Right lower lobe airspace disease. No left pleural effusion. No pneumothorax. Stable cardiomediastinal silhouette. Prior CABG. Cardiac pacemaker is again noted. No acute osseous abnormality. IMPRESSION: Right pleural effusion with right lower lobe airspace disease which may reflect atelectasis versus pneumonia. Underlying mass cannot be excluded. Electronically Signed   By: Kathreen Devoid   On: 05/23/2016 11:36   Dg Chest 2 View  Result Date: 05/13/2016 CLINICAL DATA:  Pleural effusion. EXAM: CHEST  2 VIEW COMPARISON:  04/04/2015 FINDINGS: Large right pleural effusion. Fall a small amount of aerated right upper lobe noted. There is cardiomegaly. Prior CABG. Left AICD is unchanged. No confluent opacity or effusion on the left. IMPRESSION: Large right pleural effusion with only a small amount of aerated right upper lobe. Cardiomegaly. Electronically Signed   By: Rolm Baptise M.D.   On: 05/13/2016 17:41   Ct Head Wo Contrast  Result Date: 05/18/2016 CLINICAL DATA:  Increased lethargy, altered mental status EXAM: CT HEAD WITHOUT CONTRAST TECHNIQUE: Contiguous axial images were obtained from the base of the skull through the vertex without intravenous contrast. COMPARISON:  03/21/2015 FINDINGS: Brain: No evidence of acute infarction, hemorrhage, extra-axial collection,  ventriculomegaly, or mass effect. Generalized cerebral atrophy. Periventricular white matter low attenuation likely secondary to microangiopathy. Vascular: Cerebrovascular atherosclerotic calcifications are noted. Skull: Negative for fracture or focal lesion. Sinuses/Orbits: Visualized portions of the orbits are unremarkable. Visualized portions of the paranasal sinuses and mastoid air cells are unremarkable. Other: None. IMPRESSION: 1. No acute intracranial pathology. 2. Chronic microvascular disease and cerebral atrophy. Electronically Signed   By: Kathreen Devoid   On: 05/18/2016 12:13   Ct Chest Wo Contrast  Result Date: 05/18/2016 CLINICAL DATA:  RIGHT effusion.  Thoracentesis 05/16/2016 EXAM: CT CHEST WITHOUT CONTRAST TECHNIQUE: Multidetector CT imaging of the chest was performed following the standard protocol without IV contrast. COMPARISON:  CT 05/13/2016 FINDINGS: Cardiovascular: Coronary artery calcification and aortic atherosclerotic calcification. Mediastinum/Nodes: No axillary or supraclavicular adenopathy. Pacemaker in the LEFT chest wall. No mediastinal hilar adenopathy. No pericardial fluid. Esophagus normal. Lungs/Pleura: There is a small moderate this anterior RIGHT pneumothorax. The estimated volume is approximately 5 and 10%. RIGHT pleural effusion remaining is decreased in volume from comparison CT. There is however a residual mass within the RIGHT lower lobe measuring 10 .1 x 9.8 cm (image 101, series 2) and this occupies the entirety of the RIGHT lower lobe. LEFT lung is clear Upper Abdomen: No dilated loops of large or small bowel. No pathologic calcifications. No organomegaly. No aggressive osseous lesion. No intraperitoneal free air. Musculoskeletal: Lucent lesion in the T3 vertebral body is well circumscribed in the represent hemangioma. IMPRESSION: 1. Small anterior RIGHT pneumothorax following thoracentesis 05/13/2016. 2. Decrease in pleural effusion following thoracentesis. 3.  Persistent RIGHT lower lobe mass occupying the near entirety of the RIGHT lower lobe measuring up to 10 cm. Differential would include rounded pneumonia versus carcinoma. Recommend contrast CT or FDG PET scan or bronchoscopy for further evaluation to exclude LUNG CARCINOMA. Critical Value/emergent results were called by telephone at the time of interpretation on 05/18/2016 at 12:28 pm to nurse KIM, who verbally acknowledged these results. Electronically Signed  By: Suzy Bouchard M.D.   On: 05/18/2016 12:31   Ct Chest Wo Contrast  Result Date: 05/13/2016 CLINICAL DATA:  Hypoxia.  Right pleural effusion. EXAM: CT CHEST WITHOUT CONTRAST TECHNIQUE: Multidetector CT imaging of the chest was performed following the standard protocol without IV contrast. COMPARISON:  05/13/2016 CXR and CT abdomen and pelvis exams. FINDINGS: Cardiovascular: Status post CABG. Top normal size cardiac chambers with right atrial, coronary sinus and right ventricular leads in place. Left anterior chest wall AICD device. Aortic atherosclerosis without aneurysm. No pericardial effusion. Mediastinum/Nodes: Atherosclerosis of the great vessels. No thyroid mass or thyromegaly. Trachea and mainstem bronchi appear patent. No esophageal abnormality identified. Small mediastinal lymph nodes the largest is 10 mm right lower paratracheal. Lungs/Pleura: Large right pleural effusion with compressive atelectasis sparing only a small portion of the anterior right upper lobe. Left lung is clear apart from some minimal atelectasis. Upper Abdomen: Contracted gallbladder with gallstones noted within. Splenic calcifications consistent with granulomas. No biliary dilatation. Mild adrenal gland thickening without nodularity. No acute abnormality of the visualized pancreas. Mild bilateral perinephric fat stranding. Musculoskeletal: No acute nor suspicious osseous abnormality. IMPRESSION: Large right pleural effusion with compressive atelectasis. Etiology is  uncertain. Underlying malignancy is not entirely excluded. Clear left lung. Status post CABG with AICD device and leads as above described. Uncomplicated cholelithiasis. Electronically Signed   By: Ashley Royalty M.D.   On: 05/13/2016 22:27   US Scrotum  Result Date: 05/19/2016 CLINICAL DATA:  Testicular pain on palpation.  No swelling. EXAM: SCROTAL ULTRASOUND DOPPLER ULTRASOUND OF THE TESTICLES TECHNIQUE: Complete ultrasound examination of the testicles, epididymis, and other scrotal structures was performed. Color and spectral Doppler ultrasound were also utilized to evaluate blood flow to the testicles. COMPARISON:  None. FINDINGS: Right testicle Measurements: 4.3 x 2.4 x 2.7 cm. No mass or microlithiasis visualized. Left testicle Measurements: 3.5 x 2.1 x 1.6 cm. No mass or microlithiasis visualized. Right epididymis:  Normal in size and appearance. Left epididymis:  Normal in size and appearance. Hydrocele:  None visualized. Varicocele:  None visualized. Pulsed Doppler interrogation of both testes demonstrates normal low resistance arterial and venous waveforms bilaterally. IMPRESSION: Normal testicular ultrasound Electronically Signed   By: Nolon Nations M.D.   On: 05/19/2016 17:11   [place] Art/ven Flow Abd Pelv Doppler  Result Date: 05/19/2016 CLINICAL DATA:  Testicular pain on palpation.  No swelling. EXAM: SCROTAL ULTRASOUND DOPPLER ULTRASOUND OF THE TESTICLES TECHNIQUE: Complete ultrasound examination of the testicles, epididymis, and other scrotal structures was performed. Color and spectral Doppler ultrasound were also utilized to evaluate blood flow to the testicles. COMPARISON:  None. FINDINGS: Right testicle Measurements: 4.3 x 2.4 x 2.7 cm. No mass or microlithiasis visualized. Left testicle Measurements: 3.5 x 2.1 x 1.6 cm. No mass or microlithiasis visualized. Right epididymis:  Normal in size and appearance. Left epididymis:  Normal in size and appearance. Hydrocele:  None visualized.  Varicocele:  None visualized. Pulsed Doppler interrogation of both testes demonstrates normal low resistance arterial and venous waveforms bilaterally. IMPRESSION: Normal testicular ultrasound Electronically Signed   By: Nolon Nations M.D.   On: 05/19/2016 17:11   Dg Chest Port 1 View  Result Date: 05/19/2016 CLINICAL DATA:  Follow-up right pneumothorax. EXAM: PORTABLE CHEST 1 VIEW COMPARISON:  CT chest 05/18/2016 FINDINGS: Persistent right lower lobe airspace opacity. Small right pleural effusion. Mild left basilar atelectasis. No pneumothorax. Stable cardiomegaly. Prior CABG. Three lead cardiac pacemaker. IMPRESSION: No right pneumothorax. Persistent right lower lobe airspace opacity concerning for  a mass. Electronically Signed   By: Kathreen Devoid   On: 05/19/2016 10:53   Dg Chest Port 1 View  Result Date: 05/17/2016 CLINICAL DATA:  Hypoxia and right sided pleural effusion. H/o CAD, ischemic cardiomyopathy, myocardial infarction, COPD, type 2 diabetes. Surgical h/o CABG in 2008 and cardiac defibrillator placement in 2013. Former smoker. EXAM: PORTABLE CHEST - 1 VIEW COMPARISON:  05/16/2016 FINDINGS: Stable left subclavian AICD. Previous median sternotomy and CABG. Low lung volumes with crowding of bibasilar bronchovascular structures. Heart size upper limits normal for technique.  Atheromatous aorta. Blunting of bilateral lateral costophrenic angles right worse than left ; can't exclude small pleural effusions. IMPRESSION: 1. Low volumes with bibasilar crowding, possible small effusions. Electronically Signed   By: Lucrezia Europe M.D.   On: 05/17/2016 12:44   Dg Chest Port 1 View  Result Date: 05/16/2016 CLINICAL DATA:  Followup right pleural effusion. Chronic systolic congestive heart failure. Chronic kidney disease. EXAM: PORTABLE CHEST 1 VIEW COMPARISON:  05/15/2016 FINDINGS: Stable cardiomegaly. AICD remains in appropriate position. Prior CABG. Mild improvement in right mid and lower lung airspace  opacity since prior study. Small to moderate right pleural effusion again noted. Left lung remains clear. IMPRESSION: Mild improvement in right mid and lower lung airspace opacity. Small to moderate right pleural effusion. Stable cardiomegaly. Electronically Signed   By: Earle Gell M.D.   On: 05/16/2016 11:09   Dg Chest Port 1 View  Result Date: 05/15/2016 CLINICAL DATA:  Right-sided pleural effusion EXAM: PORTABLE CHEST 1 VIEW COMPARISON:  05/14/2016 FINDINGS: Cardiac shadow is enlarged. A defibrillator is again noted and stable. Postsurgical changes are again seen. Left lung remains clear. The tiny apical pneumothorax is not well appreciated on this exam although some apical capping is noted and this may represent redistribution of residual fluid. New right perihilar and basilar infiltrate is noted. This may represent some re-expansion edema or progressive infiltrate. IMPRESSION: Increased infiltrative density in the right lung. This may represent some re-expansion edema. Small residual effusion is noted. Electronically Signed   By: Inez Catalina M.D.   On: 05/15/2016 07:17   Dg Abd 2 Views  Result Date: 05/16/2016 CLINICAL DATA:  Fecal impaction. Recent right thoracentesis for pleural effusion. EXAM: ABDOMEN - 2 VIEW COMPARISON:  CT on 05/13/2016 FINDINGS: No evidence of dilated bowel loops. Small to moderate stool seen throughout colon. The no evidence of free intraperitoneal air. Right pleural effusion with small pneumothorax component seen, consistent with recent thoracentesis. IMPRESSION: Unremarkable bowel gas pattern.  Small to moderate stool burden. Small right hydro-pneumothorax, status post recent right thoracentesis. Electronically Signed   By: Earle Gell M.D.   On: 05/16/2016 11:19   Dg Abd 2 Views  Result Date: 05/13/2016 CLINICAL DATA:  Constipation.  No bowel movement for 8 days. EXAM: ABDOMEN - 2 VIEW COMPARISON:  None. FINDINGS: No evidence of bowel obstruction. No visible significant  increase in stool burden. No organomegaly or suspicious calcification. No free air. IMPRESSION: No acute findings. Electronically Signed   By: Rolm Baptise M.D.   On: 05/13/2016 12:02   Dg Swallowing Func-speech Pathology  Result Date: 05/20/2016 Objective Swallowing Evaluation: Type of Study: MBS-Modified Barium Swallow Study Patient Details Name: [name] [name] MRN: [id number] Date of Birth: 07/05/1934 Today's Date: 05/20/2016 Time: SLP Start Time (ACUTE ONLY): 1430-SLP Stop Time (ACUTE ONLY): 1500 SLP Time Calculation (min) (ACUTE ONLY): 30 min Past Medical History: Past Medical History: Diagnosis Date . Anxiety  . Arthritis   "hands" (01/06/2012) . Boil   "  fluid boil left arm" (01/30/2012) . CAD (coronary artery disease)   bypass graft surgery 05/2006; grafts were widely patent on cath 06/2010  . Chronic kidney disease  . COPD (chronic obstructive pulmonary disease) ([place]) 1993 . Deafness in left ear  . Diabetes mellitus, type 2 ([place]) 2000 . Elbow mass   left; "just noticed it ~ 2 wk ago"  (01/06/2012) . Foot fracture 1953; 1962  left; left . Hyperlipidemia 1993 . Hypertension 1993 . Ischemic cardiomyopathy   EF 25-30%; s/p single chamber ICD 2010; upgrade to [employer] BiV ICD 01/30/12 . LBBB (left bundle branch block)  . Myocardial infarction 2008; 01/06/2012 . Nightmares   with psych eval 2012 at [facility] . PTSD (post-traumatic stress disorder)  . Shortness of breath 01/05/2012  "all the time; been going on for awhile" . Syncope and collapse 01/05/2012 . Systolic heart failure   class II . Tinnitus of left ear  Past Surgical History: Past Surgical History: Procedure Laterality Date . APPENDECTOMY  1957 . BI-VENTRICULAR IMPLANTABLE CARDIOVERTER DEFIBRILLATOR UPGRADE N/A 01/30/2012  Procedure: BI-VENTRICULAR IMPLANTABLE CARDIOVERTER DEFIBRILLATOR UPGRADE;  Surgeon: Evans Lance, MD;  Location: [facility] C[facility] LAB;  Service: Cardiovascular;  Laterality: N/A; . CARDIAC DEFIBRILLATOR PLACEMENT  01/30/2012  biventricular ICD .  CARDIAC DEFIBRILLATOR PLACEMENT  01/30/2012  biventricular ICD . CORONARY ARTERY BYPASS GRAFT  2008  3 vessel, class 4 CHF, non Q wave MI 05/19/06 . [place]; ~ 1962  left; left . INSERT / REPLACE / REMOVE PACEMAKER  01/30/2012  biventricular ICD placed HPI: 82 y.o.malewith medical history significant for coronary artery disease (ischemic cardiomyopathy) status post CABG with last EF in 2017 of 35%, kidney diseasestage III,IDDM, admitted with pleural effusion, R pneumothorax s/p thoracentesis x2 this admission, fecal impaction. Weight loss. His wife reports constant coughing with meals. Persistent RLL opacity, potential chronic aspiration per PCCM, hence swallow evaluation ordered.  Subjective: answers questions, wife present Assessment / Plan / Recommendation CHL IP CLINICAL IMPRESSIONS 05/20/2016 Therapy Diagnosis Mild oral phase dysphagia;Mild pharyngeal phase dysphagia Clinical Impression Pt presents with a mild dysphagia with intermittent, trace aspiration of thin liquids.  Aspiration in small amounts did not elicit a cough response; pt was unaware that it had occurred.  There were oral delays, discoordination during the oral preparatory phase of the swallow; weak tongue propulsion with solid food residue in valleculae.  Risk of aspiration was heightened when pt consumed solids/liquids simultaneously.  Esophageal sweep revealed retention of barium in esophageal column.   For now, recommend continuing current diet with basic precautions to minimize aspiration; will provide pt with specific exercises to address deficits.  SLP will follow.  Impact on safety and function Moderate aspiration risk   CHL IP TREATMENT RECOMMENDATION 05/20/2016 Treatment Recommendations Therapy as outlined in treatment plan below   No flowsheet data found. CHL IP DIET RECOMMENDATION 05/20/2016 SLP Diet Recommendations Regular solids;Thin liquid Liquid Administration via Cup;Straw Medication Administration Whole meds with  puree Compensations Slow rate;Small sips/bites Postural Changes Seated upright at 90 degrees   CHL IP OTHER RECOMMENDATIONS 05/20/2016 Recommended Consults -- Oral Care Recommendations Oral care BID Other Recommendations --   CHL IP FOLLOW UP RECOMMENDATIONS 05/20/2016 Follow up Recommendations Other (comment)   CHL IP FREQUENCY AND DURATION 05/20/2016 Speech Therapy Frequency (ACUTE ONLY) min 2x/week Treatment Duration 1 week      CHL IP ORAL PHASE 05/20/2016 Oral Phase Impaired Oral - Pudding Teaspoon -- Oral - Pudding Cup -- Oral - Honey Teaspoon -- Oral - Honey Cup -- Oral -  Nectar Teaspoon -- Oral - Nectar Cup -- Oral - Nectar Straw -- Oral - Thin Teaspoon -- Oral - Thin Cup -- Oral - Thin Straw -- Oral - Puree Weak lingual manipulation;Lingual pumping;Reduced posterior propulsion;Piecemeal swallowing;Delayed oral transit Oral - Mech Soft Weak lingual manipulation;Lingual pumping;Reduced posterior propulsion;Piecemeal swallowing;Delayed oral transit Oral - Regular -- Oral - Multi-Consistency -- Oral - Pill -- Oral Phase - Comment --  CHL IP PHARYNGEAL PHASE 05/20/2016 Pharyngeal Phase Impaired Pharyngeal- Pudding Teaspoon -- Pharyngeal -- Pharyngeal- Pudding Cup -- Pharyngeal -- Pharyngeal- Honey Teaspoon -- Pharyngeal -- Pharyngeal- Honey Cup -- Pharyngeal -- Pharyngeal- Nectar Teaspoon -- Pharyngeal -- Pharyngeal- Nectar Cup -- Pharyngeal -- Pharyngeal- Nectar Straw -- Pharyngeal -- Pharyngeal- Thin Teaspoon -- Pharyngeal -- Pharyngeal- Thin Cup -- Pharyngeal -- Pharyngeal- Thin Straw Delayed swallow initiation-pyriform sinuses;Trace aspiration;Penetration/Aspiration during swallow;Reduced tongue base retraction;Reduced airway/laryngeal closure Pharyngeal Material enters airway, passes BELOW cords without attempt by patient to eject out (silent aspiration) Pharyngeal- Puree -- Pharyngeal -- Pharyngeal- Mechanical Soft -- Pharyngeal -- Pharyngeal- Regular -- Pharyngeal -- Pharyngeal- Multi-consistency -- Pharyngeal  -- Pharyngeal- Pill -- Pharyngeal -- Pharyngeal Comment --  CHL IP CERVICAL ESOPHAGEAL PHASE 05/20/2016 Cervical Esophageal Phase (No Data) Pudding Teaspoon -- Pudding Cup -- Honey Teaspoon -- Honey Cup -- Nectar Teaspoon -- Nectar Cup -- Nectar Straw -- Thin Teaspoon -- Thin Cup -- Thin Straw -- Puree -- Mechanical Soft -- Regular -- Multi-consistency -- Pill -- Cervical Esophageal Comment -- No flowsheet data found. [name] [name] 05/20/2016, 3:23 PM              US Thoracentesis Asp Pleural Space W/img Guide  Result Date: 05/16/2016 INDICATION: Coronary artery disease/prior CABG, chronic kidney disease, CHF, recurrent right pleural effusion. Request made for diagnostic and therapeutic right thoracentesis. EXAM: ULTRASOUND GUIDED DIAGNOSTIC AND THERAPEUTIC RIGHT THORACENTESIS MEDICATIONS: None. COMPLICATIONS: None immediate. PROCEDURE: An ultrasound guided thoracentesis was thoroughly discussed with the patient and questions answered. The benefits, risks, alternatives and complications were also discussed. The patient understands and wishes to proceed with the procedure. Written consent was obtained. Ultrasound was performed to localize and mark an adequate pocket of fluid in the right chest. The area was then prepped and draped in the normal sterile fashion. 1% Lidocaine was used for local anesthesia. Under ultrasound guidance a Safe-T-Centesis catheter was introduced. Thoracentesis was performed. The catheter was removed and a dressing applied. FINDINGS: A total of approximately 1.8 liters of hazy, amber fluid was removed. Samples were sent to the laboratory as requested by the clinical team. IMPRESSION: Successful ultrasound guided diagnostic and therapeutic right thoracentesis yielding 1.8 liters of pleural fluid. Read by: Rowe Robert, PA-C Electronically Signed   By: Corrie Mckusick D.O.   On: 05/16/2016 14:02   US Thoracentesis Asp Pleural Space W/img Guide  Result Date: 05/14/2016 INDICATION:  Patient with history of coronary artery disease, prior CABG, weight loss, chronic kidney disease, CHF, prior smoker, large right pleural effusion. Request made for diagnostic and therapeutic right thoracentesis. EXAM: ULTRASOUND GUIDED DIAGNOSTIC AND THERAPEUTIC RIGHT THORACENTESIS MEDICATIONS: None. COMPLICATIONS: None immediate. PROCEDURE: An ultrasound guided thoracentesis was thoroughly discussed with the patient and questions answered. The benefits, risks, alternatives and complications were also discussed. The patient understands and wishes to proceed with the procedure. Written consent was obtained. Ultrasound was performed to localize and mark an adequate pocket of fluid in the right chest. The area was then prepped and draped in the normal sterile fashion. 1% Lidocaine was used for local anesthesia. Under ultrasound  guidance a Safe-T-Centesis catheter was introduced. Thoracentesis was performed. The catheter was removed and a dressing applied. FINDINGS: A total of approximately 2 liters of turbid, amber fluid was removed. Samples were sent to the laboratory as requested by the clinical team. IMPRESSION: Successful ultrasound guided diagnostic and therapeutic right thoracentesis yielding 2 liters of pleural fluid. Read by: Rowe Robert, PA-C Electronically Signed   By: Corrie Mckusick D.O.   On: 05/14/2016 13:02    ASSESSMENT: This is a very pleasant 82 years old white male with multiple medical problems including a recent diagnosis of likely stage IIIB (T4, N1, M0) non-small cell lung cancer, squamous cell carcinoma presented with large right lower lobe lung mass occupying the whole right lower lobe. The patient has generalized weakness and he is unable to stand and he came on a stretcher today to the clinic. He denied having any back pain or incontinence to urine or stool.   PLAN: I had a lengthy discussion with the patient and his wife today about his current disease stage, prognosis and treatment  options. I recommended for the patient to proceed with the PET scan that is scheduled for 22-Jun-2016 for further staging workup of his disease. The patient is not a good candidate for any systemic chemotherapy at this point. I ask the pathology department to send his tissue to be tested for PDL 1 expression to see if the patient would be a candidate for treatment with immunotherapy as first line option. I also recommend for the patient to see radiation oncology for consideration of palliative radiotherapy to the large right lower lobe lung mass. I will see him back for follow-up visit in one month's for evaluation and more detailed discussion of his treatment options based on the final staging workup and molecular studies. He was advised to call immediately if he has any concerning symptoms in the interval. The patient voices understanding of current disease status and treatment options and is in agreement with the current care plan.  All questions were answered. The patient knows to call the clinic with any problems, questions or concerns. We can certainly see the patient much sooner if necessary.  Thank you so much for allowing me to participate in the care of Izrael Peak. I will continue to follow up the patient with you and assist in his care.  I spent 40 minutes counseling the patient face to face. The total time spent in the appointment was 60 minutes.  Disclaimer: This note was dictated with voice recognition software. Similar sounding words can inadvertently be transcribed and may not be corrected upon review.   Alyxis Grippi K. May 29, 2016, 2:52 PM

## 2016-05-29 NOTE — Progress Notes (Signed)
Oncology Nurse Navigator Documentation  Oncology Nurse Navigator Flowsheets 05/29/2016  Navigator Location [facility]  Navigator Encounter Type Clinic/MDC/spoke with patient and wife today at [facility].  I explained next steps and what to expect.  I will notify Rad Onc scheduling to update them that [name] [name] has difficulty with transportation and to see if PET and appt for Rad Onc.  I will also update CSW to see what other forms of transportation are available.    Abnormal Finding Date 05/13/2016  Confirmed Diagnosis Date 05/22/2016  Patient Visit Type MedOnc  Treatment Phase Pre-Tx/Tx Discussion  Barriers/Navigation Needs Coordination of Care;Education  Education Newly Diagnosed Cancer Education  Interventions Education;Coordination of Care  Coordination of Care Appts  Education Method Verbal  Acuity Level 2 Educational needs;Other  Time Spent with Patient 30

## 2016-05-30 ENCOUNTER — Encounter: Payer: Self-pay | Admitting: Radiation Oncology

## 2016-05-30 ENCOUNTER — Telehealth: Payer: Self-pay | Admitting: *Deleted

## 2016-05-30 NOTE — Telephone Encounter (Signed)
Oncology Nurse Navigator Documentation  Oncology Nurse Navigator Flowsheets 05/30/2016  Navigator Location [facility]  Navigator Encounter Type Telephone/[name] [name] called triage today.  She states [name] [name] is not eating or drinking much and she is concerned about him being dehydrated.  I called [name] [name] to get more information.  I listened as she explained. I spoke with Dr. Julien Nordmann and he thinks he needs to go to the emergency room for evaluation.  I updated [name] [name].  She states she will call EMS to get him to [facility] ED.  She was thankful for the help.  I updated Dr. Julien Nordmann.   Telephone Outgoing Call  Treatment Phase Pre-Tx/Tx Discussion  Barriers/Navigation Needs Education  Education Other  Interventions Education  Education Method Verbal  Acuity Level 1  Time Spent with Patient 30

## 2016-06-01 ENCOUNTER — Other Ambulatory Visit: Payer: Self-pay

## 2016-06-01 ENCOUNTER — Emergency Department (HOSPITAL_COMMUNITY): Payer: PPO

## 2016-06-01 ENCOUNTER — Inpatient Hospital Stay (HOSPITAL_COMMUNITY): Payer: PPO

## 2016-06-01 ENCOUNTER — Encounter (HOSPITAL_COMMUNITY): Payer: Self-pay | Admitting: Emergency Medicine

## 2016-06-01 ENCOUNTER — Other Ambulatory Visit (HOSPITAL_COMMUNITY): Payer: Self-pay

## 2016-06-01 ENCOUNTER — Inpatient Hospital Stay (HOSPITAL_COMMUNITY)
Admission: EM | Admit: 2016-06-01 | Discharge: 2016-06-05 | DRG: 208 | Disposition: E | Payer: PPO | Attending: Pulmonary Disease | Admitting: Pulmonary Disease

## 2016-06-01 DIAGNOSIS — R402362 Coma scale, best motor response, obeys commands, at arrival to emergency department: Secondary | ICD-10-CM | POA: Diagnosis present

## 2016-06-01 DIAGNOSIS — R401 Stupor: Secondary | ICD-10-CM

## 2016-06-01 DIAGNOSIS — Z95810 Presence of automatic (implantable) cardiac defibrillator: Secondary | ICD-10-CM

## 2016-06-01 DIAGNOSIS — J440 Chronic obstructive pulmonary disease with acute lower respiratory infection: Secondary | ICD-10-CM | POA: Diagnosis not present

## 2016-06-01 DIAGNOSIS — I9589 Other hypotension: Secondary | ICD-10-CM | POA: Diagnosis present

## 2016-06-01 DIAGNOSIS — J9621 Acute and chronic respiratory failure with hypoxia: Secondary | ICD-10-CM | POA: Diagnosis not present

## 2016-06-01 DIAGNOSIS — Z794 Long term (current) use of insulin: Secondary | ICD-10-CM | POA: Diagnosis not present

## 2016-06-01 DIAGNOSIS — J189 Pneumonia, unspecified organism: Secondary | ICD-10-CM | POA: Diagnosis present

## 2016-06-01 DIAGNOSIS — Z833 Family history of diabetes mellitus: Secondary | ICD-10-CM

## 2016-06-01 DIAGNOSIS — G9340 Encephalopathy, unspecified: Secondary | ICD-10-CM | POA: Diagnosis not present

## 2016-06-01 DIAGNOSIS — R748 Abnormal levels of other serum enzymes: Secondary | ICD-10-CM

## 2016-06-01 DIAGNOSIS — Z87891 Personal history of nicotine dependence: Secondary | ICD-10-CM

## 2016-06-01 DIAGNOSIS — I10 Essential (primary) hypertension: Secondary | ICD-10-CM

## 2016-06-01 DIAGNOSIS — Z515 Encounter for palliative care: Secondary | ICD-10-CM | POA: Diagnosis not present

## 2016-06-01 DIAGNOSIS — I5022 Chronic systolic (congestive) heart failure: Secondary | ICD-10-CM | POA: Diagnosis present

## 2016-06-01 DIAGNOSIS — G9341 Metabolic encephalopathy: Secondary | ICD-10-CM | POA: Diagnosis not present

## 2016-06-01 DIAGNOSIS — R402242 Coma scale, best verbal response, confused conversation, at arrival to emergency department: Secondary | ICD-10-CM | POA: Diagnosis present

## 2016-06-01 DIAGNOSIS — E1165 Type 2 diabetes mellitus with hyperglycemia: Secondary | ICD-10-CM

## 2016-06-01 DIAGNOSIS — Z951 Presence of aortocoronary bypass graft: Secondary | ICD-10-CM

## 2016-06-01 DIAGNOSIS — D649 Anemia, unspecified: Secondary | ICD-10-CM | POA: Diagnosis present

## 2016-06-01 DIAGNOSIS — G4733 Obstructive sleep apnea (adult) (pediatric): Secondary | ICD-10-CM | POA: Diagnosis present

## 2016-06-01 DIAGNOSIS — J432 Centrilobular emphysema: Secondary | ICD-10-CM | POA: Diagnosis not present

## 2016-06-01 DIAGNOSIS — D509 Iron deficiency anemia, unspecified: Secondary | ICD-10-CM | POA: Diagnosis not present

## 2016-06-01 DIAGNOSIS — Z8042 Family history of malignant neoplasm of prostate: Secondary | ICD-10-CM

## 2016-06-01 DIAGNOSIS — I2510 Atherosclerotic heart disease of native coronary artery without angina pectoris: Secondary | ICD-10-CM | POA: Diagnosis present

## 2016-06-01 DIAGNOSIS — E872 Acidosis: Secondary | ICD-10-CM | POA: Diagnosis not present

## 2016-06-01 DIAGNOSIS — Z7982 Long term (current) use of aspirin: Secondary | ICD-10-CM

## 2016-06-01 DIAGNOSIS — M7989 Other specified soft tissue disorders: Secondary | ICD-10-CM | POA: Diagnosis not present

## 2016-06-01 DIAGNOSIS — J449 Chronic obstructive pulmonary disease, unspecified: Secondary | ICD-10-CM | POA: Diagnosis not present

## 2016-06-01 DIAGNOSIS — E785 Hyperlipidemia, unspecified: Secondary | ICD-10-CM | POA: Diagnosis present

## 2016-06-01 DIAGNOSIS — J9622 Acute and chronic respiratory failure with hypercapnia: Secondary | ICD-10-CM | POA: Diagnosis present

## 2016-06-01 DIAGNOSIS — I252 Old myocardial infarction: Secondary | ICD-10-CM

## 2016-06-01 DIAGNOSIS — N184 Chronic kidney disease, stage 4 (severe): Secondary | ICD-10-CM | POA: Diagnosis not present

## 2016-06-01 DIAGNOSIS — E1129 Type 2 diabetes mellitus with other diabetic kidney complication: Secondary | ICD-10-CM | POA: Diagnosis present

## 2016-06-01 DIAGNOSIS — F4310 Post-traumatic stress disorder, unspecified: Secondary | ICD-10-CM | POA: Diagnosis present

## 2016-06-01 DIAGNOSIS — E1122 Type 2 diabetes mellitus with diabetic chronic kidney disease: Secondary | ICD-10-CM | POA: Diagnosis not present

## 2016-06-01 DIAGNOSIS — R509 Fever, unspecified: Secondary | ICD-10-CM

## 2016-06-01 DIAGNOSIS — R945 Abnormal results of liver function studies: Secondary | ICD-10-CM | POA: Diagnosis not present

## 2016-06-01 DIAGNOSIS — R402142 Coma scale, eyes open, spontaneous, at arrival to emergency department: Secondary | ICD-10-CM | POA: Diagnosis present

## 2016-06-01 DIAGNOSIS — Z0189 Encounter for other specified special examinations: Secondary | ICD-10-CM

## 2016-06-01 DIAGNOSIS — Z8249 Family history of ischemic heart disease and other diseases of the circulatory system: Secondary | ICD-10-CM

## 2016-06-01 DIAGNOSIS — I447 Left bundle-branch block, unspecified: Secondary | ICD-10-CM | POA: Diagnosis present

## 2016-06-01 DIAGNOSIS — Z789 Other specified health status: Secondary | ICD-10-CM

## 2016-06-01 DIAGNOSIS — R0602 Shortness of breath: Secondary | ICD-10-CM | POA: Diagnosis not present

## 2016-06-01 DIAGNOSIS — I248 Other forms of acute ischemic heart disease: Secondary | ICD-10-CM | POA: Diagnosis not present

## 2016-06-01 DIAGNOSIS — C3491 Malignant neoplasm of unspecified part of right bronchus or lung: Secondary | ICD-10-CM | POA: Diagnosis not present

## 2016-06-01 DIAGNOSIS — K219 Gastro-esophageal reflux disease without esophagitis: Secondary | ICD-10-CM | POA: Diagnosis present

## 2016-06-01 DIAGNOSIS — Z882 Allergy status to sulfonamides status: Secondary | ICD-10-CM

## 2016-06-01 DIAGNOSIS — J9601 Acute respiratory failure with hypoxia: Secondary | ICD-10-CM | POA: Diagnosis present

## 2016-06-01 DIAGNOSIS — I255 Ischemic cardiomyopathy: Secondary | ICD-10-CM | POA: Diagnosis present

## 2016-06-01 DIAGNOSIS — Z66 Do not resuscitate: Secondary | ICD-10-CM | POA: Diagnosis not present

## 2016-06-01 DIAGNOSIS — Y95 Nosocomial condition: Secondary | ICD-10-CM | POA: Diagnosis present

## 2016-06-01 DIAGNOSIS — I130 Hypertensive heart and chronic kidney disease with heart failure and stage 1 through stage 4 chronic kidney disease, or unspecified chronic kidney disease: Secondary | ICD-10-CM | POA: Diagnosis not present

## 2016-06-01 DIAGNOSIS — Z4682 Encounter for fitting and adjustment of non-vascular catheter: Secondary | ICD-10-CM | POA: Diagnosis not present

## 2016-06-01 DIAGNOSIS — R778 Other specified abnormalities of plasma proteins: Secondary | ICD-10-CM | POA: Diagnosis present

## 2016-06-01 DIAGNOSIS — R7989 Other specified abnormal findings of blood chemistry: Secondary | ICD-10-CM

## 2016-06-01 DIAGNOSIS — R339 Retention of urine, unspecified: Secondary | ICD-10-CM | POA: Diagnosis not present

## 2016-06-01 DIAGNOSIS — N183 Chronic kidney disease, stage 3 unspecified: Secondary | ICD-10-CM | POA: Diagnosis present

## 2016-06-01 DIAGNOSIS — I959 Hypotension, unspecified: Secondary | ICD-10-CM | POA: Diagnosis not present

## 2016-06-01 DIAGNOSIS — IMO0002 Reserved for concepts with insufficient information to code with codable children: Secondary | ICD-10-CM | POA: Diagnosis present

## 2016-06-01 DIAGNOSIS — R410 Disorientation, unspecified: Secondary | ICD-10-CM | POA: Diagnosis not present

## 2016-06-01 DIAGNOSIS — A419 Sepsis, unspecified organism: Secondary | ICD-10-CM | POA: Diagnosis not present

## 2016-06-01 DIAGNOSIS — M19041 Primary osteoarthritis, right hand: Secondary | ICD-10-CM | POA: Diagnosis present

## 2016-06-01 DIAGNOSIS — M19042 Primary osteoarthritis, left hand: Secondary | ICD-10-CM | POA: Diagnosis present

## 2016-06-01 LAB — APTT: aPTT: 32 seconds (ref 24–36)

## 2016-06-01 LAB — PROTIME-INR
INR: 1.14
PROTHROMBIN TIME: 14.6 s (ref 11.4–15.2)

## 2016-06-01 LAB — COMPREHENSIVE METABOLIC PANEL
ALBUMIN: 2.4 g/dL — AB (ref 3.5–5.0)
ALK PHOS: 155 U/L — AB (ref 38–126)
ALT: 89 U/L — AB (ref 17–63)
AST: 92 U/L — ABNORMAL HIGH (ref 15–41)
Anion gap: 7 (ref 5–15)
BUN: 28 mg/dL — ABNORMAL HIGH (ref 6–20)
CALCIUM: 9.3 mg/dL (ref 8.9–10.3)
CHLORIDE: 102 mmol/L (ref 101–111)
CO2: 30 mmol/L (ref 22–32)
CREATININE: 2.23 mg/dL — AB (ref 0.61–1.24)
GFR calc Af Amer: 31 mL/min — ABNORMAL LOW (ref >60)
GFR calc non Af Amer: 26 mL/min — ABNORMAL LOW (ref >60)
GLUCOSE: 181 mg/dL — AB (ref 65–99)
Potassium: 3.6 mmol/L (ref 3.5–5.1)
SODIUM: 139 mmol/L (ref 135–145)
Total Bilirubin: 0.7 mg/dL (ref 0.3–1.2)
Total Protein: 6.3 g/dL — ABNORMAL LOW (ref 6.5–8.1)

## 2016-06-01 LAB — CBC WITH DIFFERENTIAL/PLATELET
BASOS ABS: 0 10*3/uL (ref 0.0–0.1)
BASOS PCT: 0 %
EOS ABS: 0.2 10*3/uL (ref 0.0–0.7)
EOS PCT: 2 %
HCT: 27.3 % — ABNORMAL LOW (ref 39.0–52.0)
HEMOGLOBIN: 8.4 g/dL — AB (ref 13.0–17.0)
Lymphocytes Relative: 12 %
Lymphs Abs: 1.1 10*3/uL (ref 0.7–4.0)
MCH: 27.2 pg (ref 26.0–34.0)
MCHC: 30.8 g/dL (ref 30.0–36.0)
MCV: 88.3 fL (ref 78.0–100.0)
Monocytes Absolute: 0.8 10*3/uL (ref 0.1–1.0)
Monocytes Relative: 9 %
NEUTROS PCT: 77 %
Neutro Abs: 7.5 10*3/uL (ref 1.7–7.7)
Platelets: 303 10*3/uL (ref 150–400)
RBC: 3.09 MIL/uL — AB (ref 4.22–5.81)
RDW: 15.2 % (ref 11.5–15.5)
WBC: 9.6 10*3/uL (ref 4.0–10.5)

## 2016-06-01 LAB — I-STAT TROPONIN, ED: Troponin i, poc: 0.11 ng/mL (ref 0.00–0.08)

## 2016-06-01 LAB — PROCALCITONIN: Procalcitonin: 0.46 ng/mL

## 2016-06-01 LAB — GLUCOSE, CAPILLARY: Glucose-Capillary: 194 mg/dL — ABNORMAL HIGH (ref 65–99)

## 2016-06-01 LAB — INFLUENZA PANEL BY PCR (TYPE A & B)
Influenza A By PCR: NEGATIVE
Influenza B By PCR: NEGATIVE

## 2016-06-01 LAB — I-STAT CG4 LACTIC ACID, ED: LACTIC ACID, VENOUS: 0.63 mmol/L (ref 0.5–1.9)

## 2016-06-01 LAB — BRAIN NATRIURETIC PEPTIDE: B Natriuretic Peptide: 307 pg/mL — ABNORMAL HIGH (ref 0.0–100.0)

## 2016-06-01 MED ORDER — PREGABALIN 25 MG PO CAPS
25.0000 mg | ORAL_CAPSULE | Freq: Two times a day (BID) | ORAL | Status: DC
  Filled 2016-06-01: qty 1

## 2016-06-01 MED ORDER — INSULIN ASPART 100 UNIT/ML ~~LOC~~ SOLN
0.0000 [IU] | SUBCUTANEOUS | Status: DC
  Administered 2016-06-01: 4 [IU] via SUBCUTANEOUS
  Administered 2016-06-02 – 2016-06-03 (×5): 3 [IU] via SUBCUTANEOUS

## 2016-06-01 MED ORDER — DIAZEPAM 5 MG PO TABS: 5.0000 mg | ORAL_TABLET | Freq: Every evening | ORAL | Status: DC | PRN

## 2016-06-01 MED ORDER — SODIUM CHLORIDE 0.9 % IV SOLN
INTRAVENOUS | Status: AC
  Administered 2016-06-01: via INTRAVENOUS

## 2016-06-01 MED ORDER — DEXTROSE 5 % IV SOLN
2.0000 g | Freq: Once | INTRAVENOUS | Status: AC
  Administered 2016-06-01: 2 g via INTRAVENOUS
  Filled 2016-06-01: qty 2

## 2016-06-01 MED ORDER — ENOXAPARIN SODIUM 120 MG/0.8ML ~~LOC~~ SOLN
1.0000 mg/kg | Freq: Once | SUBCUTANEOUS | Status: AC
  Administered 2016-06-01: 110 mg via SUBCUTANEOUS
  Filled 2016-06-01: qty 0.8

## 2016-06-01 MED ORDER — IPRATROPIUM-ALBUTEROL 0.5-2.5 (3) MG/3ML IN SOLN
3.0000 mL | Freq: Four times a day (QID) | RESPIRATORY_TRACT | Status: DC
  Administered 2016-06-02: 3 mL via RESPIRATORY_TRACT
  Filled 2016-06-01 (×2): qty 3

## 2016-06-01 MED ORDER — VANCOMYCIN HCL 10 G IV SOLR
2000.0000 mg | Freq: Once | INTRAVENOUS | Status: AC
  Administered 2016-06-01: 2000 mg via INTRAVENOUS
  Filled 2016-06-01: qty 2000

## 2016-06-01 MED ORDER — ASPIRIN 81 MG PO CHEW
81.0000 mg | CHEWABLE_TABLET | Freq: Every day | ORAL | Status: DC
  Administered 2016-06-02: 81 mg via ORAL
  Filled 2016-06-01: qty 1

## 2016-06-01 MED ORDER — INSULIN GLARGINE 100 UNIT/ML ~~LOC~~ SOLN
10.0000 [IU] | Freq: Every day | SUBCUTANEOUS | Status: DC
  Administered 2016-06-01 – 2016-06-02 (×2): 10 [IU] via SUBCUTANEOUS
  Filled 2016-06-01 (×2): qty 0.1

## 2016-06-01 MED ORDER — DEXTROSE 5 % IV SOLN
1.0000 g | Freq: Two times a day (BID) | INTRAVENOUS | Status: DC
  Administered 2016-06-02 (×2): 1 g via INTRAVENOUS
  Filled 2016-06-01 (×3): qty 1

## 2016-06-01 NOTE — Progress Notes (Signed)
Pharmacy Antibiotic Note  [name] [name] is a 82 y.o. male admitted on 05/11/2016 with pneumonia.  Pharmacy has been consulted for vancomycin dosing.  Cefepime x 2 ordered in ED.  Plan: Vancomycin 2g x 1. F/u SCr to determine further doses.    Temp (24hrs), Avg:100.5 F (38.1 C), Min:100.5 F (38.1 C), Max:100.5 F (38.1 C)  No results for input(s): WBC, CREATININE, LATICACIDVEN, VANCOTROUGH, VANCOPEAK, VANCORANDOM, GENTTROUGH, GENTPEAK, GENTRANDOM, TOBRATROUGH, TOBRAPEAK, TOBRARND, AMIKACINPEAK, AMIKACINTROU, AMIKACIN in the last 168 hours.  CrCl cannot be calculated (Unknown ideal weight.).    Allergies  Allergen Reactions  . Sulfa Antibiotics Swelling and Rash    Hands swell    Antimicrobials this admission: 2/25 Vancomycin >>  2/25 Cefepime  Dose adjustments this admission:  Microbiology results: 2/25 BCx: sent   Thank you for allowing pharmacy to be a part of this patient's care.  [name] 05/20/2016 7:49 PM

## 2016-06-01 NOTE — ED Triage Notes (Addendum)
Per EMS, patient from home, wife reports patient is no longer eating x3 days. Wife also reports patient has had fever and dark foul smelling odor. Hx lung cancer. EMS reports attempt to give liquid tylenol but patient spit it out.

## 2016-06-01 NOTE — ED Notes (Signed)
XR at bedside

## 2016-06-01 NOTE — ED Provider Notes (Signed)
[facility] DEPT Provider Note   CSN: [id number] Arrival date & time: 06/02/2016  1807     History   Chief Complaint Chief Complaint  Patient presents with  . Fever    HPI [name] [name] is a 82 y.o. male.  HPI   Hx limited by patient condition, wife providing most of hx  Presents with concern for increasing cough, sleepiness, shortness of breath, coughing up more phlegm yellow-green color. Sleeping more, confusion that develops at night which was also present at the hospital during recent admission -was screaming "police!".  No known fevers at home, but did have temperature on arrival to ED.  Not been eating or drinking for the last 2-3 days.  No CP, no HA, no abdominal pain.  Mental status waxing and waning. No diarrhea, no vomiting. No black or bloody stool.  Breathing appeared to be more labored, fast. Improved some when O2 increased by EMS.  Past Medical History:  Diagnosis Date  . Anxiety   . Arthritis    "hands" (01/06/2012)  . Boil    "fluid boil left arm" (01/30/2012)  . CAD (coronary artery disease)    bypass graft surgery 05/2006; grafts were widely patent on cath 06/2010   . Chronic kidney disease   . COPD (chronic obstructive pulmonary disease) ([place]) 1993  . 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 Diabetes mellitus, type 2 ([place]) 2000  . Elbow mass    left; "just noticed it ~ 2 wk ago"  (01/06/2012)  . Foot fracture 1953; 1962   left; left  . Hyperlipidemia 1993  . Hypertension 1993  . Ischemic cardiomyopathy    EF 25-30%; s/p single chamber ICD 2010; upgrade to Med[employer]onic BiV ICD 01/30/12  . LBBB (left bundle branch block)   . Myocardial infarction 2008; 01/06/2012  . Nightmares    with psych eval 2012 at [facility]  . PTSD (post-traumatic stress disorder)   . Shortness of breath 01/05/2012   "all the time; been going on for awhile"  . Stage III squamous cell carcinoma of right lung ([place]) 05/29/2016  . Syncope and collapse 01/05/2012  . Systolic heart failure    class II  .  Tinnitus of left ear     Patient Active Problem List   Diagnosis Date Noted  . Left arm swelling 05/17/2016  . Elevated troponin 05/11/2016  . Elevated LFTs 05/28/2016  . Acute encephalopathy 05/31/2016  . Fever 05/15/2016  . Stage III squamous cell carcinoma of right lung ([place]) 05/29/2016  . Urinary retention   . S/P thoracentesis   . Pleural effusion   . Lung consolidation ([place])   . Aspiration pneumonia of right lower lobe ([place])   . Right lower lobe lung mass 05/18/2016  . Postprocedural pneumothorax 05/18/2016  . Pleural effusion on right 05/13/2016  . Community acquired pneumonia of right lung 05/13/2016  . Anemia 05/13/2016  . Melena 05/13/2016  . Fecal impaction ([place]) 05/13/2016  . Acute respiratory failure with hypoxia ([place]) 05/13/2016  . AKI (acute kidney injury) ([place]) 05/13/2016  . CKD (chronic kidney disease), stage III 05/13/2016  . Rash and nonspecific skin eruption 04/09/2016  . Elevated serum creatinine 03/20/2016  . Anemia, iron deficiency 04/05/2015  . Memory change 03/12/2015  . Pustules determined by examination 09/10/2014  . Frequent falls 08/04/2014  . Diarrhea 08/04/2014  . Stage 3 chronic renal impairment associated with type 2 diabetes mellitus ([place]) 11/25/2013  . Coronary atherosclerosis of native coronary artery 10/13/2013  . Routine general medical examination at  a health care facility 06/12/2013  . PAD (peripheral artery disease) ([place]) 09/14/2012  . OSA (obstructive sleep apnea) 05/06/2012  . Other primary cardiomyopathies 05/05/2012  . Vertigo 03/10/2012  . Obese 03/10/2012  . NSTEMI (non-ST elevated myocardial infarction) ([place]) 01/07/2012  . Chronic systolic CHF (congestive heart failure) ([place]) 01/06/2012  . Knee pain 06/25/2011  . Sensation disturbance of skin 01/10/2011  . Nightmares 01/10/2011  . Anxiety 11/26/2010  . SOB (shortness of breath) 10/22/2010  . Automatic implantable cardioverter-defibrillator in situ 03/18/2010  . DIZZINESS  08/13/2009  . Type II diabetes mellitus with renal manifestations, uncontrolled ([place]) 07/25/2009  . GERD 06/22/2008  . CAD (coronary artery disease) 06/04/2008  . TINNITUS 11/09/2006  . ADVEF, DRUG/MEDICINAL/BIOLOGICAL SUBST NOS 09/14/2006  . Hyperlipidemia 08/12/2006  . Essential hypertension 08/12/2006  . COPD (chronic obstructive pulmonary disease) ([place]) 08/12/2006  . NICOTINE ADDICTION 08/10/2006  . AMI, NON-Q WAVE, INITIAL EPISODE 08/10/2006  . Ischemic cardiomyopathy 08/10/2006  . ERECTILE DYSFUNCTION, ORGANIC 08/10/2006  . Insomnia 08/10/2006    Past Surgical History:  Procedure Laterality Date  . APPENDECTOMY  1957  . BI-VENTRICULAR IMPLANTABLE CARDIOVERTER DEFIBRILLATOR UPGRADE N/A 01/30/2012   Procedure: BI-VENTRICULAR IMPLANTABLE CARDIOVERTER DEFIBRILLATOR UPGRADE;  Surgeon: Evans Lance, MD;  Location: [facility] CATH LAB;  Service: Cardiovascular;  Laterality: N/A;  . CARDIAC DEFIBRILLATOR PLACEMENT  01/30/2012   biventricular ICD  . CARDIAC DEFIBRILLATOR PLACEMENT  01/30/2012   biventricular ICD  . CORONARY ARTERY BYPASS GRAFT  2008   3 vessel, class 4 CHF, non Q wave MI 05/19/06  . [place]; ~ 1962   left; left  . INSERT / REPLACE / REMOVE PACEMAKER  01/30/2012   biventricular ICD placed  . VIDEO BRONCHOSCOPY Bilateral 05/22/2016   Procedure: VIDEO BRONCHOSCOPY WITH FLUORO;  Surgeon: Rigoberto Noel, MD;  Location: [facility] ENDOSCOPY;  Service: Cardiopulmonary;  Laterality: Bilateral;       Home Medications    Prior to Admission medications   Medication Sig Start Date End Date Taking? Authorizing Provider  amLODipine (NORVASC) 5 MG tablet Take 5 mg by mouth at bedtime.    Yes Historical Provider, MD  aspirin 81 MG tablet Take 81 mg by mouth at bedtime.    Yes Historical Provider, MD  carvedilol (COREG) 12.5 MG tablet Take 1 tablet (12.5 mg total) by mouth 2 (two) times daily with a meal. 05/23/16  Yes Mauricio Gerome Apley, MD  diazepam (VALIUM) 10 MG tablet  TAKE 1/2 TO 1 TABLET BY MOUTH AT [facility] NEEDED FOR ANXIETY OR RINGING IN EARS 04/03/16  Yes Tonia Ghent, MD  NOVOLOG 100 UNIT/ML injection USE MAXIMUM OF 56 UNITS PER DAY WITH V-GO PUMP 04/14/16  Yes Elayne Snare, MD  pregabalin (LYRICA) 25 MG capsule Take 1 capsule (25 mg total) by mouth 2 (two) times daily. 03/25/16  Yes Larey Dresser, MD  rosuvastatin (CRESTOR) 40 MG tablet TAKE 1 TABLET BY MOUTH ONCE A DAY 05/05/16  Yes Larey Dresser, MD  torsemide (DEMADEX) 20 MG tablet Take 1 tablet (20 mg total) by mouth every other day. 04/14/16  Yes Satira Mccallum Tillery, PA-C  VICTOZA 18 MG/3ML SOPN Inject 0.2 mLs (1.2 mg total) into the skin daily. Inject once daily at the same time Patient taking differently: Inject 1.2 mg into the skin daily. Only takes when blood sugar is high 10/24/15  Yes Elayne Snare, MD  cephALEXin (KEFLEX) 500 MG capsule TAKE 1 CAPSULE BY MOUTH 4 TIMES DAILY 05/27/16   Tonia Ghent, MD  ferrous sulfate 325 (65 FE) MG tablet TAKE 1 TABLET BY MOUTH ONCE A DAY WITH BREAKFAST Patient not taking: Reported on 05/26/2016 05/05/16   Tonia Ghent, MD  Insulin Disposable Pump (V-GO 20) KIT USE DAILY AS DIRECTED 05/05/16   Elayne Snare, MD  Insulin Pen Needle (PEN NEEDLES) 31G X 5 MM MISC Inject 15 Units as directed 2 (two) times daily. Use pen needles for pt insulin pen. Dx E11.29 04/11/15   Tonia Ghent, MD  nitroGLYCERIN (NITROSTAT) 0.4 MG SL tablet Place 1 tablet (0.4 mg total) under the tongue every 5 (five) minutes as needed. For chest pain 09/14/12   Wellington Hampshire, MD  Nutritional Supplements (QUINOA KALE & HEMP PO) Take by mouth daily.    Historical Provider, MD  [facility] DELICA LANCETS FINE MISC Use to check blood sugar 3 times per day dx code 250.42 08/30/13   Elayne Snare, MD  [facility] VERIO test strip USE AS DIRECTED TO CHECK BLOOD SUGAR THREE TIMES PER DAY 05/05/16   Tonia Ghent, MD    Family History Family History  Problem Relation Age of Onset  . Heart disease Father      MI age 78  . Prostate cancer Father   . Diabetes Sister   . Hyperlipidemia Sister   . Cancer Maternal Grandmother     liver, cirrhosis  . Depression Neg Hx   . Alcohol abuse Neg Hx   . Drug abuse Neg Hx   . Stroke Neg Hx   . Colon cancer Neg Hx     Social History Social History  Substance Use Topics  . Smoking status: Former Smoker    Packs/day: 0.50    Years: 35.00    Types: Cigarettes  . Smokeless tobacco: Never Used     Comment: 01/06/2012 "quit smoking in 2008; slip and have one ocasionally still"  . Alcohol use 4.2 oz/week    7 Glasses of wine per week     Comment: 01/06/2012 "big bottle of wine at least 3 nights/wk; 6 pack pretty much q night"; 01/30/2012 "maybe a glass of wine q hs since left hospital last"". 06/2013 "a beer or two a week"     Allergies   Sulfa antibiotics   Review of Systems Review of Systems  Unable to perform ROS: Mental status change  Constitutional: Positive for activity change, appetite change, fatigue and fever.  HENT: Positive for congestion and rhinorrhea.   Respiratory: Positive for cough and shortness of breath.   Gastrointestinal: Negative for abdominal pain, blood in stool, constipation, diarrhea, nausea and vomiting.  Genitourinary: Positive for testicular pain (since he was in hospital prior). Negative for dysuria.  Skin: Negative for rash.  Neurological: Negative for weakness and headaches.     Physical Exam Updated Vital Signs BP (!) 98/47   Pulse 83   Temp (!) 100.7 F (38.2 C) (Oral) Comment: reported to rn   Resp 16   Ht _0  (1.702 m) Comment: 5'7"  Wt 239 lb (108.4 kg)   SpO2 100%   BMI 37.43 kg/m   Physical Exam  Constitutional: He is oriented to person, place, and time. He appears well-developed and well-nourished. He has a sickly appearance. He appears ill. No distress.  Sleepy, will follow commands with encouragement, answer some questions  HENT:  Head: Normocephalic and atraumatic.  Eyes: Conjunctivae  and EOM are normal.  Neck: Normal range of motion.  Cardiovascular: Normal rate, regular rhythm, normal heart sounds and intact distal pulses.  Exam reveals no gallop and no friction rub.   No murmur heard. Pulmonary/Chest: Effort normal. No respiratory distress. He has decreased breath sounds in the left lower field. He has no wheezes. He has rhonchi. He has no rales.  Abdominal: Soft. He exhibits no distension. There is no tenderness. There is no guarding.  Musculoskeletal: He exhibits edema (LUE with significant swelling in comparison to right, bilat lower ext).  Neurological: He is oriented to person, place, and time. He has normal strength. No cranial nerve deficit or sensory deficit. Coordination normal. GCS eye subscore is 4. GCS verbal subscore is 4. GCS motor subscore is 6.  Generalized weakness but equal strength 4 extremities Difficult to understand (not acute change per wife)  Skin: Skin is warm and dry. He is not diaphoretic.  Nursing note and vitals reviewed.    ED Treatments / Results  Labs (all labs ordered are listed, but only abnormal results are displayed) Labs Reviewed  CBC WITH DIFFERENTIAL/PLATELET - Abnormal; Notable for the following:       Result Value   RBC 3.09 (*)    Hemoglobin 8.4 (*)    HCT 27.3 (*)    All other components within normal limits  COMPREHENSIVE METABOLIC PANEL - Abnormal; Notable for the following:    Glucose, Bld 181 (*)    BUN 28 (*)    Creatinine, Ser 2.23 (*)    Total Protein 6.3 (*)    Albumin 2.4 (*)    AST 92 (*)    ALT 89 (*)    Alkaline Phosphatase 155 (*)    GFR calc non Af Amer 26 (*)    GFR calc Af Amer 31 (*)    All other components within normal limits  BRAIN NATRIURETIC PEPTIDE - Abnormal; Notable for the following:    B Natriuretic Peptide 307.0 (*)    All other components within normal limits  CBC WITH DIFFERENTIAL/PLATELET - Abnormal; Notable for the following:    RBC 2.23 (*)    Hemoglobin 6.4 (*)    HCT 19.8  (*)    Lymphs Abs 0.6 (*)    All other components within normal limits  COMPREHENSIVE METABOLIC PANEL - Abnormal; Notable for the following:    Potassium 2.6 (*)    Chloride 118 (*)    CO2 16 (*)    Creatinine, Ser 1.37 (*)    Calcium 5.4 (*)    Total Protein 3.0 (*)    Albumin 1.2 (*)    GFR calc non Af Amer 47 (*)    GFR calc Af Amer 55 (*)    All other components within normal limits  TROPONIN I - Abnormal; Notable for the following:    Troponin I 0.13 (*)    All other components within normal limits  TROPONIN I - Abnormal; Notable for the following:    Troponin I 0.06 (*)    All other components within normal limits  BLOOD GAS, ARTERIAL - Abnormal; Notable for the following:    pH, Arterial 7.289 (*)    pCO2 arterial 66.5 (*)    pO2, Arterial 119 (*)    Bicarbonate 30.8 (*)    Acid-Base Excess 3.1 (*)    All other components within normal limits  GLUCOSE, CAPILLARY - Abnormal; Notable for the following:    Glucose-Capillary 194 (*)    All other components within normal limits  BLOOD GAS, ARTERIAL - Abnormal; Notable for the following:    pH, Arterial 7.272 (*)    pCO2  arterial 70.3 (*)    pO2, Arterial 78.1 (*)    Bicarbonate 31.4 (*)    Acid-Base Excess 3.8 (*)    All other components within normal limits  BLOOD GAS, ARTERIAL - Abnormal; Notable for the following:    pCO2 arterial 48.8 (*)    pO2, Arterial 312 (*)    Bicarbonate 28.5 (*)    Acid-Base Excess 3.6 (*)    All other components within normal limits  GLUCOSE, CAPILLARY - Abnormal; Notable for the following:    Glucose-Capillary 145 (*)    All other components within normal limits  GLUCOSE, CAPILLARY - Abnormal; Notable for the following:    Glucose-Capillary 145 (*)    All other components within normal limits  I-STAT TROPOININ, ED - Abnormal; Notable for the following:    Troponin i, poc 0.11 (*)    All other components within normal limits  MRSA PCR SCREENING  CULTURE, BLOOD (ROUTINE X 2)    CULTURE, BLOOD (ROUTINE X 2)  CULTURE, EXPECTORATED SPUTUM-ASSESSMENT  GRAM STAIN  PROCALCITONIN  PROTIME-INR  APTT  INFLUENZA PANEL BY PCR (TYPE A & B)  HIV ANTIBODY (ROUTINE TESTING)  STREP PNEUMONIAE URINARY ANTIGEN  URINALYSIS, ROUTINE W REFLEX MICROSCOPIC  I-STAT CG4 LACTIC ACID, ED    EKG  EKG Interpretation None       Radiology Ct Head Wo Contrast  Result Date: 06/02/2016 CLINICAL DATA:  Obtunded.  Increased lethargy and confusion. EXAM: CT HEAD WITHOUT CONTRAST TECHNIQUE: Contiguous axial images were obtained from the base of the skull through the vertex without intravenous contrast. COMPARISON:  Head CT 05/18/2016 FINDINGS: Brain: No evidence of acute infarction, hemorrhage, hydrocephalus, extra-axial collection or mass lesion/mass effect. Stable generalized atrophy and chronic small vessel ischemia. Bilateral basal gangliar calcifications are unchanged, right greater than left. Vascular: Atherosclerosis of skullbase vasculature without hyperdense vessel or abnormal calcification. Skull: Normal. Negative for fracture or focal lesion. Sinuses/Orbits: Paranasal sinuses and mastoid air cells are clear. Bilateral cataract resection. Other: None. IMPRESSION: 1. No acute intracranial abnormality. Stable atrophy and chronic small vessel ischemia. 2. No evidence of intracranial metastatic disease on noncontrast head CT. Electronically Signed   By: Jeb Levering M.D.   On: 06/02/2016 03:55   Dg Chest Port 1 View  Result Date: 06/02/2016 CLINICAL DATA:  Initial evaluation status post intubation. EXAM: PORTABLE CHEST 1 VIEW COMPARISON:  Prior radiograph from 05/19/2016. FINDINGS: Patient is intubated with the tip of an endotracheal tube positioned approximately 2.8 cm above the carina. Cardiomegaly with sequela of prior CABG. Left-sided pacemaker/ AICD. Right pleural effusion. Persistent opacity at the right lung base, likely a combination of the effusion, atelectasis, consolidation and  known lung cancer. Fluid tracks to the right lung apex. Overall, changes are slightly improved from previous. Left lung remains largely clear. Persistent mild perihilar vascular congestion, slightly improved from prior. No pneumothorax. Osseous structures unchanged. IMPRESSION: 1. Tip of the endotracheal tube approximately 2.8 cm above the carina. 2. Persistent right pleural effusion with right mid and lower lung opacity, slightly improved from prior. Electronically Signed   By: Jeannine Boga M.D.   On: 06/02/2016 05:47   Dg Chest Port 1 View  Result Date: 05/31/2016 CLINICAL DATA:  Sepsis protocol. Recent diagnosis right lung cancer. Recurrent pleural effusions. EXAM: PORTABLE CHEST 1 VIEW COMPARISON:  05/23/2016 FINDINGS: Sternotomy wires and left-sided pacemaker unchanged. Persistent opacification over the right mid to lower lung likely combination effusion, atelectasis and known lung cancer. Fluid tracks to the right apex. Mild prominence of  the perihilar markings suggesting a component of vascular congestion. Moderate stable cardiomegaly. Calcified plaque over the aortic arch. Degenerative change of the spine. IMPRESSION: Persistent opacification over the right mid to lower lung without significant change likely combination of effusion, atelectasis and known lung cancer. Moderate cardiomegaly with suggestion of mild vascular congestion. Aortic atherosclerosis. Electronically Signed   By: Marin Olp M.D.   On: 05/31/2016 19:30   Dg Abd Portable 1v  Result Date: 06/02/2016 CLINICAL DATA:  Initial evaluation for NG tube placement. EXAM: PORTABLE ABDOMEN - 1 VIEW COMPARISON:  Prior radiograph from 05/16/2016. FINDINGS: NG tube in place, tip overlies the distal stomach/proximal duodenum. Side hole well beyond the GE junction. Tip projects inferiorly. Visualized bowel gas pattern nonobstructive. Small right pleural effusion with associated right basilar opacity, which may reflect atelectasis or  infiltrate. IMPRESSION: 1. Enteric tube in place with tip overlying the distal stomach/proximal duodenum. Tip projects inferiorly. 2. Small right pleural effusion with associated right basilar opacity, which may reflect atelectasis or infiltrate. Electronically Signed   By: Jeannine Boga M.D.   On: 06/02/2016 06:08    Procedures Procedures (including critical care time)  Medications Ordered in ED Medications  diazepam (VALIUM) tablet 5-10 mg (not administered)  aspirin chewable tablet 81 mg (not administered)  pregabalin (LYRICA) capsule 25 mg (25 mg Oral Not Given 06/02/16 1000)  insulin glargine (LANTUS) injection 10 Units (10 Units Subcutaneous Given 06/04/2016 2354)  insulin aspart (novoLOG) injection 0-20 Units (3 Units Subcutaneous Given 06/02/16 1200)  0.9 %  sodium chloride infusion ( Intravenous Stopped 06/02/16 1100)  ceFEPIme (MAXIPIME) 1 g in dextrose 5 % 50 mL IVPB (1 g Intravenous Given 06/02/16 1000)  pantoprazole (PROTONIX) injection 40 mg (not administered)  morphine bolus via infusion 5-20 mg (not administered)  morphine 250 mg in sodium chloride 0.9 % 250 mL (1 mg/mL) infusion (10 mg/hr Intravenous Rate/Dose Verify 06/02/16 1200)  ceFEPIme (MAXIPIME) 2 g in dextrose 5 % 50 mL IVPB (0 g Intravenous Stopped 05/22/2016 2047)  vancomycin (VANCOCIN) 2,000 mg in sodium chloride 0.9 % 500 mL IVPB (0 mg Intravenous Stopped 05/24/2016 2347)  enoxaparin (LOVENOX) injection 110 mg (110 mg Subcutaneous Given 05/21/2016 2212)  sodium chloride 0.9 % bolus 1,000 mL (1,000 mLs Intravenous Given 06/02/16 0536)     Initial Impression / Assessment and Plan / ED Course  I have reviewed the triage vital signs and the nursing notes.  Pertinent labs & imaging results that were available during my care of the patient were reviewed by me and considered in my medical decision making (see chart for details).     82yo male with history of COPD, DM, htn, chronic systolic CHF, CAD, pacemaker, months of  generalized weakness, weight loss, recent admission with new diagnosis of lung cancer and encephalopathy work up, who presents with concern for shortness of breath, productive cough, decreased appetite, left arm swelling, continuing encephalpathy.   Patient febrile to 100.5, tachypneic, with O2 increased to 3.5 from baseline of 2pNC.  Given hx, patient given vanc/cefepime for concern for HCAP. CXR appears unchanged.  Troponin mildly elevated, suspect from strain.  Treated for HCAP, however no leukocytosis, normal lactic acid, procalcitonin.  Other possible etiologies of his increased dyspnea, temp 100.5, increased O2 req include thromboembolism.  Given LUE swelling concerning for possible DVT, history of cancer, hospitalization, immobilization, have high suspicion for PE. Unable to perform vascular US at this time and unable to do CT PE study given renal function. VQ scan not readily available  at this time and would likely be difficult to interpret in setting of lung ca. No sign of bleeding by hx. Given high clinical suspicion for PE and low suspicion for complications of anticoagulation will give empiric enoxaparin at this time.    Encephalopathy was evaluated during last admission with EEG and CT head. Doubt meningitis/encephalitis/acute CVA given nonfocal exam, continuing symptoms, no leukocytosis, no nuchal rigidity. Pt with mild elevation in LFTs new from prior, no RUQ tenderness on my exam, no emesis, and will defer imaging at this time as doubt choledocolithiasis or cholecystitis.  Pt admitted for further care.      Final Clinical Impressions(s) / ED Diagnoses   Final diagnoses:  Sepsis ([place])  Elevated LFTs  Fever  Obtunded  Shortness of breath  Encephalopathy  Fever, unspecified fever cause    New Prescriptions Current Discharge Medication List       Gareth Morgan, MD 06/02/16 1422

## 2016-06-01 NOTE — H&P (Signed)
History and Physical    [name] [name] OVF:[id number] DOB: Jul 26, 1934 DOA: 06/04/2016  PCP: Elsie Stain, MD   Patient coming from: Home  Chief Complaint: Fevers, cough, lethargy, food-refusal, left arm swelling   HPI: [name] [name] is a 82 y.o. 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 Patient was discharged from the hospital 10 days ago after management for urinary retention and fecal impaction, and identification of a new right middle lobe mass. Patient was also altered throughout that admission. He completed antibiotics during the hospitalization and bronchoscopy was performed. Since the discharge, he has had progressive decline, becoming increasingly weak and lethargic, now unable to walk or transfer on his own. He continues to be confused and has now developed fevers and worsening in his cough which has become productive of thick dark sputum. There's been no recent fall or trauma and the patient has not been vomiting or having diarrhea. His wife, who provides much of the history, noted swelling of the left upper extremity beginning a couple days ago and progressively worsening. There is no discoloration to the left upper extremity and the patient has not voiced any pain complaints. Since the recent discharge, he has seen hematology/oncology and their clinic and is being considered for possible immunotherapy and palliative radiation.  ED Course: Upon arrival to the ED, patient is found to be febrile to 38.1 C, tachypneic in the 30s, with respiratory distress, and vitals otherwise stable. EKG features a paced rhythm and chest x-ray is notable for persistent opacification over the right middle and lower lung without significant change. Also noted on CT is  moderate cardiomegaly with suggestion of mild vascular congestion. Chemistry panels notable for a creatinine of 2.23, up from an apparent baseline of roughly 2. Chemistries are also notable for mild elevations in transaminases and alkaline phosphatase. CBC features a normocytic anemia with hemoglobin 8.4, down from 9-9.5 earlier this month. Lactic acid is reassuring at 0.63, INR is 1.14, troponin is mildly elevated to 0.11 and BNP is mildly elevated to 307. Influenza PCR is negative. Blood cultures were obtained and the patient was treated with empiric vancomycin and cefepime. There is concern that the patient has a left upper extremity DVT and likely PE, but CTA was not pursued in light of the patient's poor renal function. He was given an empiric dose of Lovenox 1 mg/kg. He remained hemodynamically stable in the ED, but continues  To have increased work of breathing and confusion, and he will be admitted to the stepdown unit for ongoing evaluation and management of acute respiratory failure with hypercarbia with concern for PE.    Review of Systems:  Unable to obtain ROS secondary to patient's clinical condition with acute encephalopathy.  Past Medical History:  Diagnosis Date  . Anxiety   . Arthritis    "hands" (01/06/2012)  . Boil    "fluid boil left arm" (01/30/2012)  . CAD (coronary artery disease)    bypass graft surgery 05/2006; grafts were widely patent on cath 06/2010   . Chronic kidney disease   . COPD (chronic obstructive pulmonary disease) ([place]) 1993  . 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 Diabetes mellitus, type 2 ([place]) 2000  . Elbow mass    left; "just noticed it ~ 2 wk ago"  (01/06/2012)  . Foot fracture 1953; [id number]  left; left  . Hyperlipidemia 1993  . Hypertension 1993  . Ischemic cardiomyopathy    EF 25-30%; s/p single chamber ICD 2010; upgrade to [employer]onic BiV ICD 01/30/12  . LBBB (left bundle branch block)   . Myocardial infarction 2008; 01/06/2012  . Nightmares    with psych eval  2012 at [facility]  . PTSD (post-traumatic stress disorder)   . Shortness of breath 01/05/2012   "all the time; been going on for awhile"  . Stage III squamous cell carcinoma of right lung ([place]) 05/29/2016  . Syncope and collapse 01/05/2012  . Systolic heart failure    class II  . Tinnitus of left ear     Past Surgical History:  Procedure Laterality Date  . APPENDECTOMY  1957  . BI-VENTRICULAR IMPLANTABLE CARDIOVERTER DEFIBRILLATOR UPGRADE N/A 01/30/2012   Procedure: BI-VENTRICULAR IMPLANTABLE CARDIOVERTER DEFIBRILLATOR UPGRADE;  Surgeon: Evans Lance, MD;  Location: [facility] CATH LAB;  Service: Cardiovascular;  Laterality: N/A;  . CARDIAC DEFIBRILLATOR PLACEMENT  01/30/2012   biventricular ICD  . CARDIAC DEFIBRILLATOR PLACEMENT  01/30/2012   biventricular ICD  . CORONARY ARTERY BYPASS GRAFT  2008   3 vessel, class 4 CHF, non Q wave MI 05/19/06  . [place]; ~ 1962   left; left  . INSERT / REPLACE / REMOVE PACEMAKER  01/30/2012   biventricular ICD placed  . VIDEO BRONCHOSCOPY Bilateral 05/22/2016   Procedure: VIDEO BRONCHOSCOPY WITH FLUORO;  Surgeon: Rigoberto Noel, MD;  Location: [facility] ENDOSCOPY;  Service: Cardiopulmonary;  Laterality: Bilateral;     reports that he has quit smoking. His smoking use included Cigarettes. He has a 17.50 pack-year smoking history. He has never used smokeless tobacco. He reports that he drinks about 4.2 oz of alcohol per week . He reports that he does not use drugs.  Allergies  Allergen Reactions  . Sulfa Antibiotics Swelling and Rash    Hands swell    Family History  Problem Relation Age of Onset  . Heart disease Father     MI age 37  . Prostate cancer Father   . Diabetes Sister   . Hyperlipidemia Sister   . Cancer Maternal Grandmother     liver, cirrhosis  . Depression Neg Hx   . Alcohol abuse Neg Hx   . Drug abuse Neg Hx   . Stroke Neg Hx   . Colon cancer Neg Hx      Prior to Admission medications   Medication Sig Start Date End Date  Taking? Authorizing Provider  amLODipine (NORVASC) 5 MG tablet Take 5 mg by mouth at bedtime.    Yes Historical Provider, MD  aspirin 81 MG tablet Take 81 mg by mouth at bedtime.    Yes Historical Provider, MD  carvedilol (COREG) 12.5 MG tablet Take 1 tablet (12.5 mg total) by mouth 2 (two) times daily with a meal. 05/23/16  Yes Mauricio Gerome Apley, MD  diazepam (VALIUM) 10 MG tablet TAKE 1/2 TO 1 TABLET BY MOUTH AT [facility] NEEDED FOR ANXIETY OR RINGING IN EARS 04/03/16  Yes Tonia Ghent, MD  NOVOLOG 100 UNIT/ML injection USE MAXIMUM OF 56 UNITS PER DAY WITH V-GO PUMP 04/14/16  Yes Elayne Snare, MD  pregabalin (LYRICA) 25 MG capsule Take 1 capsule (25 mg total) by mouth 2 (two) times daily. 03/25/16  Yes Larey Dresser, MD  rosuvastatin (CRESTOR) 40 MG tablet TAKE 1 TABLET BY MOUTH ONCE A DAY 05/05/16  Yes Larey Dresser, MD  torsemide (DEMADEX) 20 MG tablet  Take 1 tablet (20 mg total) by mouth every other day. 04/14/16  Yes Satira Mccallum Tillery, PA-C  VICTOZA 18 MG/3ML SOPN Inject 0.2 mLs (1.2 mg total) into the skin daily. Inject once daily at the same time Patient taking differently: Inject 1.2 mg into the skin daily. Only takes when blood sugar is high 10/24/15  Yes Elayne Snare, MD  cephALEXin (KEFLEX) 500 MG capsule TAKE 1 CAPSULE BY MOUTH 4 TIMES DAILY 05/27/16   Tonia Ghent, MD  ferrous sulfate 325 (65 FE) MG tablet TAKE 1 TABLET BY MOUTH ONCE A DAY WITH BREAKFAST Patient not taking: Reported on 05/26/2016 05/05/16   Tonia Ghent, MD  Insulin Disposable Pump (V-GO 20) KIT USE DAILY AS DIRECTED 05/05/16   Elayne Snare, MD  Insulin Pen Needle (PEN NEEDLES) 31G X 5 MM MISC Inject 15 Units as directed 2 (two) times daily. Use pen needles for pt insulin pen. Dx E11.29 04/11/15   Tonia Ghent, MD  nitroGLYCERIN (NITROSTAT) 0.4 MG SL tablet Place 1 tablet (0.4 mg total) under the tongue every 5 (five) minutes as needed. For chest pain 09/14/12   Wellington Hampshire, MD  Nutritional Supplements  (QUINOA KALE & HEMP PO) Take by mouth daily.    Historical Provider, MD  [facility] DELICA LANCETS FINE MISC Use to check blood sugar 3 times per day dx code 250.42 08/30/13   Elayne Snare, MD  [facility] VERIO test strip USE AS DIRECTED TO CHECK BLOOD SUGAR THREE TIMES PER DAY 05/05/16   Tonia Ghent, MD    Physical Exam: Vitals:   05/23/2016 2049 05/09/2016 2130 05/17/2016 2145 05/22/2016 2200  BP: 129/62 111/61  122/66  Pulse: 84 89  82  Resp: _0 Temp:      TempSrc:      SpO2: 98% 99%  97%  Weight:   108.4 kg (239 lb)   Height:   _1  (1.702 m)       Constitutional: No acute respiratory distress, calm, comfortable Eyes: PERTLA, lids and conjunctivae normal ENMT: Mucous membranes are dry and tachy. Posterior pharynx clear of any exudate or lesions.   Neck: normal, supple, no masses, no thyromegaly Respiratory: Coarse upper airways noise, crackles and rhonchi at right base. Occasional expiratory wheeze. No accessory muscle use.  Cardiovascular: S1 & S2 heard, regular rate and rhythm. No extremity edema. JVP not well-visualized.  Abdomen: No distension, soft, no tenderness, no masses palpated. Bowel sounds active.  Musculoskeletal: no clubbing / cyanosis. Generalized edema of LUE. Normal muscle tone.  Skin: no significant rashes, lesions, ulcers. Warm, dry, well-perfused. Neurologic: No gross facial asymmetry, PERRL. Moving all extremities spontaneously. Patellar DTR's normal.   Psychiatric:  Alert, but confused. Recognizes wife. Calm.     Labs on Admission: I have personally reviewed following labs and imaging studies  CBC:  Recent Labs Lab 05/13/2016 1947  WBC 9.6  NEUTROABS 7.5  HGB 8.4*  HCT 27.3*  MCV 88.3  PLT 144   Basic Metabolic Panel:  Recent Labs Lab 05/20/2016 1947  NA 139  K 3.6  CL 102  CO2 30  GLUCOSE 181*  BUN 28*  CREATININE 2.23*  CALCIUM 9.3   GFR: Estimated Creatinine Clearance: 31.5 mL/min (by C-G formula based on SCr of 2.23 mg/dL  (H)). Liver Function Tests:  Recent Labs Lab 05/31/2016 1947  AST 92*  ALT 89*  ALKPHOS 155*  BILITOT 0.7  PROT 6.3*  ALBUMIN 2.4*   No results for input(s):  LIPASE, AMYLASE in the last 168 hours. No results for input(s): AMMONIA in the last 168 hours. Coagulation Profile:  Recent Labs Lab 05/12/2016 1947  INR 1.14   Cardiac Enzymes: No results for input(s): CKTOTAL, CKMB, CKMBINDEX, TROPONINI in the last 168 hours. BNP (last 3 results)  Recent Labs  04/08/16 1529  PROBNP 511.0*   HbA1C: No results for input(s): HGBA1C in the last 72 hours. CBG: No results for input(s): GLUCAP in the last 168 hours. Lipid Profile: No results for input(s): CHOL, HDL, LDLCALC, TRIG, CHOLHDL, LDLDIRECT in the last 72 hours. Thyroid Function Tests: No results for input(s): TSH, T4TOTAL, FREET4, T3FREE, THYROIDAB in the last 72 hours. Anemia Panel: No results for input(s): VITAMINB12, FOLATE, FERRITIN, TIBC, IRON, RETICCTPCT in the last 72 hours. Urine analysis:    Component Value Date/Time   COLORURINE YELLOW 05/13/2016 1316   APPEARANCEUR HAZY (A) 05/13/2016 1316   LABSPEC 1.013 05/13/2016 1316   PHURINE 6.0 05/13/2016 1316   GLUCOSEU NEGATIVE 05/13/2016 1316   GLUCOSEU 250 (A) 06/16/2014 0910   HGBUR NEGATIVE 05/13/2016 1316   BILIRUBINUR NEGATIVE 05/13/2016 1316   KETONESUR NEGATIVE 05/13/2016 1316   PROTEINUR 100 (A) 05/13/2016 1316   UROBILINOGEN 0.2 06/16/2014 0910   NITRITE NEGATIVE 05/13/2016 1316   LEUKOCYTESUR NEGATIVE 05/13/2016 1316   Sepsis Labs: _0 (procalcitonin:4,lacticidven:4) )No results found for this or any previous visit (from the past 240 hour(s)).   Radiological Exams on Admission: Dg Chest Port 1 View  Result Date: 05/28/2016 CLINICAL DATA:  Sepsis protocol. Recent diagnosis right lung cancer. Recurrent pleural effusions. EXAM: PORTABLE CHEST 1 VIEW COMPARISON:  05/23/2016 FINDINGS: Sternotomy wires and left-sided pacemaker unchanged. Persistent  opacification over the right mid to lower lung likely combination effusion, atelectasis and known lung cancer. Fluid tracks to the right apex. Mild prominence of the perihilar markings suggesting a component of vascular congestion. Moderate stable cardiomegaly. Calcified plaque over the aortic arch. Degenerative change of the spine. IMPRESSION: Persistent opacification over the right mid to lower lung without significant change likely combination of effusion, atelectasis and known lung cancer. Moderate cardiomegaly with suggestion of mild vascular congestion. Aortic atherosclerosis. Electronically Signed   By: Marin Olp M.D.   On: 05/23/2016 19:30    EKG: Independently reviewed. Paced rhythm.   Assessment/Plan  1. Acute respiratory failure with hypercarbia  - Presents with fever, cough, progressive generalized weakness, ongoing confusion, and food-refusal  - Opacity in right lung on CXR at location of known mass, difficult to exclude associated PNA  - PE is also a significant concern given the marked swelling of LUE with known cancer  - CTA precluded by renal insufficiency and V/Q scan and [place] not immediately available; he was given a dose of empiric Lovenox 1 mg/kg in ED  - Blood cultures obtained in ED and patient started on empiric vancomycin and cefepime  - Plan to continue abx while following cultures and clinical course; obtain V/Q scan and venous US of LUE in am and continue anticoagulation as indicated, will also check TTE given elevated troponin  - He has respiratory acidosis and is admitted to SDU for BiPAP   2. LUE swelling  - Noted by pt's wife a couple days prior to admit  - Primary concern is for DVT and he was given an empiric dose of Lovenox 1 mg/kg in ED  - Planning for V/Q scan and LUE doppler in am  - Elevate the arm   3. Cancer of right lung   - Pt recently  diagnosed with stage III scc of right lung - He is under the care of heme/onc in outpatient setting, not a  candidate for systemic chemotherapy per notes, but being considered for immunotherapy and palliative radiation   - PET scan is scheduled for Jun 24, 2016 - No mets seen on head CT or CT abd/pelvis earlier this month   4. CAD, elevated troponin - Hx of CAD s/p 3v CABG in 2008, widely patent grafts on cath 2012 - Troponin is mildly elevated to 0.04 on admission, likely secondary to PE or demand ischemia in setting of acute illness  - He will monitored on telemetry, biomarkers will be trended, EKG repeated, ASA 81 continued as tolerated, Coreg held for soft BP, and Crestor held for elevated LFT's    5. CKD stage III-IV - SCr is 2.23 on admission, slightly up from apparent baseline of 2  - Appears roughly euvolemic on admission; he is refusing oral intake and placed on gentle NS hydration  - Renally-dose medications, avoid nephrotoxins where possible, avoid dehydration    6. Insulin-dependent DM  - A1c 6.9% in February 2018  - Managed at home with disposable Novolog pumps, using up to 56 units daily  - Check CBG with q4h and start with Lanuts 10 units qHS and high-intensity SSI correctional, adjust as needed   7. Elevated LFT's  - AST and ALT elevated to 90-range on admission, recently wnl  - No abdominal tenderness on exam  - CT abd/pelvis on 05/13/16 with small gall stones, otherwise normal appearance to liver and GB - Check RUQ [place], trend LFT's   8. Acute encephalopathy  - No focal deficit identified, likely toxic-metabolic  - This has been present for ~a month now and evaluated with head CT on 05/18/16 with no acute intracranial abnormality  - B12 and folate wnl earlier this month, TSH and RPR pending  - He is increasingly difficult to arouse after admission; there is respiratory acidosis on ABG and he is started on BiPAP - Will repeat head CT with concern for possible CVA or met, and as he will likely need anticoagulation   9. Chronic systolic CHF - Appears roughly euvolemic on admission  -  TTE (05/21/16) with EF 30-35%, diffuse HK, and grade 1 diastolic dysfunction  - Plan to follow strict I/O's, daily wts; hold Coreg d/t soft BP, diurese prn   10. Urinary retention - Required Foley last admission for retention, but was able to void by discharge  - Bladder scan and I/O cath prn   11. Normocytic anemia - Hgb is 8.4 on admission, down from recent values in 9-9.5 range  - No obvious blood-loss, will check FOBT and repeat CBC in am  - Recent iron studies consistent with IDA and he has been started on supplementation - B12 and folate wnl earlier this month   DVT prophylaxis: Treatment-dose Lovenox  Code Status: Full  Family Communication: Wife updated at bedside Disposition Plan: Admit to stepdown unit Consults called: None Admission status: Inpatient    Vianne Bulls, MD [facility] Pager [phone]  If 7PM-7AM, please contact night-coverage [email or web address] Password [email or web address]  05/12/2016, 10:20 PM

## 2016-06-01 NOTE — ED Notes (Signed)
Bed: MQ59 Expected date:  Expected time:  Means of arrival:  Comments: 82 yo lung CA; fever

## 2016-06-02 ENCOUNTER — Inpatient Hospital Stay (HOSPITAL_COMMUNITY): Payer: PPO

## 2016-06-02 ENCOUNTER — Inpatient Hospital Stay (HOSPITAL_COMMUNITY): Payer: PPO | Admitting: Certified Registered Nurse Anesthetist

## 2016-06-02 ENCOUNTER — Encounter (HOSPITAL_COMMUNITY): Payer: PPO

## 2016-06-02 ENCOUNTER — Other Ambulatory Visit: Payer: Self-pay | Admitting: *Deleted

## 2016-06-02 DIAGNOSIS — G9340 Encephalopathy, unspecified: Secondary | ICD-10-CM

## 2016-06-02 DIAGNOSIS — J432 Centrilobular emphysema: Secondary | ICD-10-CM

## 2016-06-02 DIAGNOSIS — J9601 Acute respiratory failure with hypoxia: Secondary | ICD-10-CM

## 2016-06-02 DIAGNOSIS — I5022 Chronic systolic (congestive) heart failure: Secondary | ICD-10-CM

## 2016-06-02 LAB — CBC WITH DIFFERENTIAL/PLATELET
Basophils Absolute: 0 10*3/uL (ref 0.0–0.1)
Basophils Relative: 0 %
EOS PCT: 2 %
Eosinophils Absolute: 0.2 10*3/uL (ref 0.0–0.7)
HCT: 19.8 % — ABNORMAL LOW (ref 39.0–52.0)
HEMOGLOBIN: 6.4 g/dL — AB (ref 13.0–17.0)
LYMPHS PCT: 8 %
Lymphs Abs: 0.6 10*3/uL — ABNORMAL LOW (ref 0.7–4.0)
MCH: 28.7 pg (ref 26.0–34.0)
MCHC: 32.3 g/dL (ref 30.0–36.0)
MCV: 88.8 fL (ref 78.0–100.0)
Monocytes Absolute: 0.9 10*3/uL (ref 0.1–1.0)
Monocytes Relative: 11 %
NEUTROS PCT: 79 %
Neutro Abs: 6.1 10*3/uL (ref 1.7–7.7)
PLATELETS: 226 10*3/uL (ref 150–400)
RBC: 2.23 MIL/uL — AB (ref 4.22–5.81)
RDW: 15.2 % (ref 11.5–15.5)
WBC: 7.8 10*3/uL (ref 4.0–10.5)

## 2016-06-02 LAB — TROPONIN I
Troponin I: 0.13 ng/mL (ref <0.03)
Troponin I: 0.06 ng/mL (ref <0.03)

## 2016-06-02 LAB — BLOOD GAS, ARTERIAL
ACID-BASE EXCESS: 3.1 mmol/L — AB (ref 0.0–2.0)
ACID-BASE EXCESS: 3.6 mmol/L — AB (ref 0.0–2.0)
ACID-BASE EXCESS: 3.8 mmol/L — AB (ref 0.0–2.0)
Bicarbonate: 30.8 mmol/L — ABNORMAL HIGH (ref 20.0–28.0)
Bicarbonate: 28.5 mmol/L — ABNORMAL HIGH (ref 20.0–28.0)
Bicarbonate: 31.4 mmol/L — ABNORMAL HIGH (ref 20.0–28.0)
DELIVERY SYSTEMS: POSITIVE
DRAWN BY: 11249
DRAWN BY: 235321
Drawn by: 235321
EXPIRATORY PAP: 8
FIO2: 40
FIO2: 100
Inspiratory PAP: 22
LHR: 14 {breaths}/min
MODE: POSITIVE
O2 CONTENT: 4 L/min
O2 SAT: 97.5 %
O2 SAT: 99.7 %
O2 Saturation: 93.5 %
PATIENT TEMPERATURE: 98.6
PATIENT TEMPERATURE: 98.9
PCO2 ART: 48.8 mmHg — AB (ref 32.0–48.0)
PCO2 ART: 66.5 mmHg — AB (ref 32.0–48.0)
PEEP/CPAP: 5 cmH2O
PH ART: 7.272 — AB (ref 7.350–7.450)
PH ART: 7.385 (ref 7.350–7.450)
Patient temperature: 98.6
VT: 530 mL
pCO2 arterial: 70.3 mmHg (ref 32.0–48.0)
pH, Arterial: 7.289 — ABNORMAL LOW (ref 7.350–7.450)
pO2, Arterial: 119 mmHg — ABNORMAL HIGH (ref 83.0–108.0)
pO2, Arterial: 312 mmHg — ABNORMAL HIGH (ref 83.0–108.0)
pO2, Arterial: 78.1 mmHg — ABNORMAL LOW (ref 83.0–108.0)

## 2016-06-02 LAB — COMPREHENSIVE METABOLIC PANEL
ALBUMIN: 1.2 g/dL — AB (ref 3.5–5.0)
ALK PHOS: 74 U/L (ref 38–126)
ALT: 45 U/L (ref 17–63)
ANION GAP: 7 (ref 5–15)
AST: 38 U/L (ref 15–41)
BUN: 16 mg/dL (ref 6–20)
CALCIUM: 5.4 mg/dL — AB (ref 8.9–10.3)
CO2: 16 mmol/L — AB (ref 22–32)
CREATININE: 1.37 mg/dL — AB (ref 0.61–1.24)
Chloride: 118 mmol/L — ABNORMAL HIGH (ref 101–111)
GFR calc Af Amer: 55 mL/min — ABNORMAL LOW (ref >60)
GFR calc non Af Amer: 47 mL/min — ABNORMAL LOW (ref >60)
GLUCOSE: 90 mg/dL (ref 65–99)
Potassium: 2.6 mmol/L — CL (ref 3.5–5.1)
SODIUM: 141 mmol/L (ref 135–145)
Total Bilirubin: 1.2 mg/dL (ref 0.3–1.2)
Total Protein: 3 g/dL — ABNORMAL LOW (ref 6.5–8.1)

## 2016-06-02 LAB — GLUCOSE, CAPILLARY
GLUCOSE-CAPILLARY: 145 mg/dL — AB (ref 65–99)
GLUCOSE-CAPILLARY: 145 mg/dL — AB (ref 65–99)
Glucose-Capillary: 125 mg/dL — ABNORMAL HIGH (ref 65–99)
Glucose-Capillary: 114 mg/dL — ABNORMAL HIGH (ref 65–99)
Glucose-Capillary: 123 mg/dL — ABNORMAL HIGH (ref 65–99)

## 2016-06-02 LAB — MRSA PCR SCREENING: MRSA by PCR: NEGATIVE

## 2016-06-02 MED ORDER — SUCCINYLCHOLINE CHLORIDE 20 MG/ML IJ SOLN
INTRAMUSCULAR | Status: DC | PRN
  Administered 2016-06-02: 100 mg via INTRAVENOUS

## 2016-06-02 MED ORDER — ENSURE ENLIVE PO LIQD
237.0000 mL | Freq: Two times a day (BID) | ORAL | Status: DC
Start: 2016-06-02 — End: 2016-06-02

## 2016-06-02 MED ORDER — SODIUM CHLORIDE 0.9 % IV BOLUS (SEPSIS)
1000.0000 mL | Freq: Once | INTRAVENOUS | Status: AC
  Administered 2016-06-02: 1000 mL via INTRAVENOUS

## 2016-06-02 MED ORDER — ETOMIDATE 2 MG/ML IV SOLN
INTRAVENOUS | Status: DC | PRN
  Administered 2016-06-02: 10 mg via INTRAVENOUS

## 2016-06-02 MED ORDER — ETOMIDATE 2 MG/ML IV SOLN
INTRAVENOUS | Status: AC
  Filled 2016-06-02: qty 10

## 2016-06-02 MED ORDER — SODIUM CHLORIDE 0.9 % IV SOLN
2.0000 mg/h | INTRAVENOUS | Status: DC
  Administered 2016-06-02: 10 mg/h via INTRAVENOUS
  Filled 2016-06-02 (×2): qty 10

## 2016-06-02 MED ORDER — SUCCINYLCHOLINE CHLORIDE 200 MG/10ML IV SOSY
PREFILLED_SYRINGE | INTRAVENOUS | Status: AC
  Filled 2016-06-02: qty 10

## 2016-06-02 MED ORDER — PROPOFOL 1000 MG/100ML IV EMUL
5.0000 ug/kg/min | INTRAVENOUS | Status: DC
  Administered 2016-06-02: 5 ug/kg/min via INTRAVENOUS
  Filled 2016-06-02: qty 100

## 2016-06-02 MED ORDER — ACETAMINOPHEN 160 MG/5ML PO SOLN
650.0000 mg | Freq: Four times a day (QID) | ORAL | Status: DC | PRN
  Administered 2016-06-02: 650 mg
  Filled 2016-06-02: qty 20.3

## 2016-06-02 MED ORDER — PANTOPRAZOLE SODIUM 40 MG IV SOLR: 40.0000 mg | Freq: Every day | INTRAVENOUS | Status: DC

## 2016-06-02 MED ORDER — MORPHINE BOLUS VIA INFUSION
5.0000 mg | INTRAVENOUS | Status: DC | PRN
  Filled 2016-06-02: qty 20

## 2016-06-02 NOTE — Progress Notes (Signed)
CRITICAL VALUE ALERT  Critical value received:  K-2.6 Ca-5.4 Trop-0.06   Date of notification:  06/02/16  Time of notification:  5997  Critical value read back:Yes.    Nurse who received alert:  Tessie Eke  MD notified (1st page):  McQuaid  Time of first page:  1135

## 2016-06-02 NOTE — Anesthesia Procedure Notes (Signed)
Procedure Name: Intubation Date/Time: 06/02/2016 4:22 AM Performed by: West Pugh Pre-anesthesia Checklist: Patient identified, Emergency Drugs available, Suction available, Patient being monitored and Timeout performed Patient Re-evaluated:Patient Re-evaluated prior to inductionOxygen Delivery Method: Circle system utilized Preoxygenation: Pre-oxygenation with 100% oxygen Intubation Type: IV induction, Cricoid Pressure applied and Rapid sequence Laryngoscope Size: 3 and Glidescope Grade View: Grade I Tube type: Subglottic suction tube Tube size: 8.0 mm Number of attempts: 1 Airway Equipment and Method: Stylet Placement Confirmation: ETT inserted through vocal cords under direct vision,  positive ETCO2,  CO2 detector and breath sounds checked- equal and bilateral Secured at: 24 cm Tube secured with: Tape Dental Injury: Teeth and Oropharynx as per pre-operative assessment  Comments: Dentures removed prior to induction.

## 2016-06-02 NOTE — Progress Notes (Signed)
Rx Brief note:  Cefepime  See 2/25 note by A Runyon for full details.    Plan: Cefepime 1 Gm IV q12h for HCAP F/u SCr, adjust dose as needed  Thanks Dorrene German 06/02/2016 1:34 AM

## 2016-06-02 NOTE — Progress Notes (Signed)
Repeat ABG worse on BiPAP. Provider notified and decision to intubate was made.

## 2016-06-02 NOTE — Progress Notes (Signed)
eLink Physician-Brief Progress Note Patient Name: [name] [name] DOB: September 12, 1934 MRN: [id number]   Date of Service  06/02/2016  HPI/Events of Note  Fever - Temp = 100.9 F. Patient is comfort care.   eICU Interventions  Will order: 1. Tylenol Solution 650 mg per tube Q 6 hours PRN Temp > 100.5 F.     Intervention Category Intermediate Interventions: Other:  Lysle Dingwall 06/02/2016, 11:33 PM

## 2016-06-02 NOTE — Progress Notes (Signed)
eLink Physician-Brief Progress Note Patient Name: [name] [name] DOB: 1934/10/17 MRN: [id number]   Date of Service  06/02/2016  HPI/Events of Note  Hypotension post intubation and sedation - last LVEF = 30-35%.  eICU Interventions  Will order: 1. Bolus with 0.9 NaCl 1 liter IV over 1 hour now.      Intervention Category Major Interventions: Hypotension - evaluation and management  Sommer,Steven Eugene 06/02/2016, 5:26 AM

## 2016-06-02 NOTE — Consult Note (Signed)
PULMONARY / CRITICAL CARE MEDICINE   Name: [name] [name] MRN: [id number] DOB: 1934/05/04    ADMISSION DATE:  05/16/2016 CONSULTATION DATE:  06/02/2016  REFERRING MD:  Dr. Teryl Lucy  CHIEF COMPLAINT:  Shortness of breath  HISTORY OF PRESENT ILLNESS:   82 year old male with newly diagnosed stage III lung cancer, systolic congestive heart failure, chronic kidney disease, diabetes was admitted with acute respiratory failure with hypoxemia overnight. He was just discharged from this facility about 10 days ago after he was diagnosed with squamous cell carcinoma of the right lower lobe in the setting of a bronchoscopy. He has a past medical history significant for dysphagia and recurrent aspiration. He was seen by oncology as an outpatient he felt that he was not a candidate for systemic chemotherapy but he may be a candidate for the newer immunologic medicines. He was still undergoing evaluation and was to receive a PET scan on 2016/06/25. However, his wife revised the history today because he is intubated on my exam, she tells me that over the last 10 days he said progressive worsening shortness of breath, intermittent hallucinations. She brought him to the emergency department because of severe shortness of breath. There, she notes that he was somewhat combative initially and then became somnolent. A blood gas was obtained which showed hypercarbia. He was initially started on noninvasive mechanical ventilation but because of continued decline in his respiratory status he was intubated and placed on mechanical ventilatory support.  She tells me this morning that he is always been quite clear he does not want to be treated with machines. She also states that she knows that he would not want any sort of prolonged treatment.  PAST MEDICAL HISTORY :  He  has a past medical history of Anxiety; Arthritis; Boil; CAD (coronary artery disease); Chronic kidney disease; COPD (chronic obstructive pulmonary disease) ([place])  (1993); Deafness in left ear; Diabetes mellitus, type 2 ([place]) (2000); Elbow mass; Foot fracture (1953; 1962); Hyperlipidemia (1993); Hypertension (1993); Ischemic cardiomyopathy; LBBB (left bundle branch block); Myocardial infarction (2008; 01/06/2012); Nightmares; PTSD (post-traumatic stress disorder); Shortness of breath (01/05/2012); Stage III squamous cell carcinoma of right lung ([place]) (05/29/2016); Syncope and collapse (01/05/2012); Systolic heart failure; and Tinnitus of left ear.  PAST SURGICAL HISTORY: He  has a past surgical history that includes Coronary artery bypass graft (2008); Appendectomy (1957); Fracture surgery (7681; ~ 1962); Insert / replace / remove pacemaker (01/30/2012); bi-ventricular implantable cardioverter defibrillator upgrade (N/A, 01/30/2012); Cardiac defibrillator placement (01/30/2012); Cardiac defibrillator placement (01/30/2012); and Video bronchoscopy (Bilateral, 05/22/2016).  Allergies  Allergen Reactions  . Sulfa Antibiotics Swelling and Rash    Hands swell    No current facility-administered medications on file prior to encounter.    Current Outpatient Prescriptions on File Prior to Encounter  Medication Sig  . amLODipine (NORVASC) 5 MG tablet Take 5 mg by mouth at bedtime.   Marland Kitchen aspirin 81 MG tablet Take 81 mg by mouth at bedtime.   . carvedilol (COREG) 12.5 MG tablet Take 1 tablet (12.5 mg total) by mouth 2 (two) times daily with a meal.  . diazepam (VALIUM) 10 MG tablet TAKE 1/2 TO 1 TABLET BY MOUTH AT BEDTIMEAS NEEDED FOR ANXIETY OR RINGING IN EARS  . NOVOLOG 100 UNIT/ML injection USE MAXIMUM OF 56 UNITS PER DAY WITH V-GO PUMP  . pregabalin (LYRICA) 25 MG capsule Take 1 capsule (25 mg total) by mouth 2 (two) times daily.  . rosuvastatin (CRESTOR) 40 MG tablet TAKE 1 TABLET BY MOUTH ONCE A DAY  .  torsemide (DEMADEX) 20 MG tablet Take 1 tablet (20 mg total) by mouth every other day.  Marland Kitchen VICTOZA 18 MG/3ML SOPN Inject 0.2 mLs (1.2 mg total) into the skin daily.  Inject once daily at the same time (Patient taking differently: Inject 1.2 mg into the skin daily. Only takes when blood sugar is high)  . cephALEXin (KEFLEX) 500 MG capsule TAKE 1 CAPSULE BY MOUTH 4 TIMES DAILY  . ferrous sulfate 325 (65 FE) MG tablet TAKE 1 TABLET BY MOUTH ONCE A DAY WITH BREAKFAST (Patient not taking: Reported on 05/26/2016)  . Insulin Disposable Pump (V-GO 20) KIT USE DAILY AS DIRECTED  . Insulin Pen Needle (PEN NEEDLES) 31G X 5 MM MISC Inject 15 Units as directed 2 (two) times daily. Use pen needles for pt insulin pen. Dx E11.29  . nitroGLYCERIN (NITROSTAT) 0.4 MG SL tablet Place 1 tablet (0.4 mg total) under the tongue every 5 (five) minutes as needed. For chest pain  . Nutritional Supplements (QUINOA KALE & HEMP PO) Take by mouth daily.  Glory Rosebush DELICA LANCETS FINE MISC Use to check blood sugar 3 times per day dx code 250.42  . ONETOUCH VERIO test strip USE AS DIRECTED TO CHECK BLOOD SUGAR THREE TIMES PER DAY    FAMILY HISTORY/SOCIAL HISTORY/REVIEW OF SYSTEMS:   Cannot obtain due to intubation.  SUBJECTIVE:  As above  VITAL SIGNS: BP (!) 123/49   Pulse 83   Temp (!) 96.3 F (35.7 C) (Axillary)   Resp 14   Ht 5' 7" (1.702 m) Comment: 5'7"  Wt 239 lb (108.4 kg)   SpO2 98%   BMI 37.43 kg/m   HEMODYNAMICS:    VENTILATOR SETTINGS: Vent Mode: PRVC FiO2 (%):  [40 %-100 %] 40 % Set Rate:  [14 bmp] 14 bmp Vt Set:  [530 mL] 530 mL PEEP:  [5 cmH20] 5 cmH20 Plateau Pressure:  [17 cmH20] 17 cmH20  INTAKE / OUTPUT: No intake/output data recorded.  PHYSICAL EXAMINATION: General:  Sedated on vent Neuro:  Sedated, will stir intermittently HEENT:  NCAT ETT in place Cardiovascular:  RRR, no mgr Lungs:  No breath sounds RLL, rhonchi upper lobes, vent supported breathing Abdomen:  BS+, soft, nontender Musculoskeletal:  MSK normal bulk and tone Skin:  No rash or skin breakdown  LABS:  BMET  Recent Labs Lab 05/25/2016 1947  NA 139  K 3.6  CL 102  CO2 30   BUN 28*  CREATININE 2.23*  GLUCOSE 181*    Electrolytes  Recent Labs Lab 05/19/2016 1947  CALCIUM 9.3    CBC  Recent Labs Lab 05/30/2016 1947  WBC 9.6  HGB 8.4*  HCT 27.3*  PLT 303    Coag's  Recent Labs Lab 05/24/2016 1947  APTT 32  INR 1.14    Sepsis Markers  Recent Labs Lab 05/12/2016 1947 05/15/2016 1955  LATICACIDVEN  --  0.63  PROCALCITON 0.46  --     ABG  Recent Labs Lab 05/30/2016 2359 06/02/16 0340 06/02/16 0521  PHART 7.289* 7.272* 7.385  PCO2ART 66.5* 70.3* 48.8*  PO2ART 119* 78.1* 312*    Liver Enzymes  Recent Labs Lab 05/12/2016 1947  AST 92*  ALT 89*  ALKPHOS 155*  BILITOT 0.7  ALBUMIN 2.4*    Cardiac Enzymes  Recent Labs Lab 06/02/16 0018  TROPONINI 0.13*    Glucose  Recent Labs Lab 05/09/2016 2344  GLUCAP 194*    Imaging Ct Head Wo Contrast  Result Date: 06/02/2016 CLINICAL DATA:  Obtunded.  Increased lethargy  and confusion. EXAM: CT HEAD WITHOUT CONTRAST TECHNIQUE: Contiguous axial images were obtained from the base of the skull through the vertex without intravenous contrast. COMPARISON:  Head CT 05/18/2016 FINDINGS: Brain: No evidence of acute infarction, hemorrhage, hydrocephalus, extra-axial collection or mass lesion/mass effect. Stable generalized atrophy and chronic small vessel ischemia. Bilateral basal gangliar calcifications are unchanged, right greater than left. Vascular: Atherosclerosis of skullbase vasculature without hyperdense vessel or abnormal calcification. Skull: Normal. Negative for fracture or focal lesion. Sinuses/Orbits: Paranasal sinuses and mastoid air cells are clear. Bilateral cataract resection. Other: None. IMPRESSION: 1. No acute intracranial abnormality. Stable atrophy and chronic small vessel ischemia. 2. No evidence of intracranial metastatic disease on noncontrast head CT. Electronically Signed   By: Jeb Levering M.D.   On: 06/02/2016 03:55   Dg Chest Port 1 View  Result Date:  06/02/2016 CLINICAL DATA:  Initial evaluation status post intubation. EXAM: PORTABLE CHEST 1 VIEW COMPARISON:  Prior radiograph from 05/08/2016. FINDINGS: Patient is intubated with the tip of an endotracheal tube positioned approximately 2.8 cm above the carina. Cardiomegaly with sequela of prior CABG. Left-sided pacemaker/ AICD. Right pleural effusion. Persistent opacity at the right lung base, likely a combination of the effusion, atelectasis, consolidation and known lung cancer. Fluid tracks to the right lung apex. Overall, changes are slightly improved from previous. Left lung remains largely clear. Persistent mild perihilar vascular congestion, slightly improved from prior. No pneumothorax. Osseous structures unchanged. IMPRESSION: 1. Tip of the endotracheal tube approximately 2.8 cm above the carina. 2. Persistent right pleural effusion with right mid and lower lung opacity, slightly improved from prior. Electronically Signed   By: Jeannine Boga M.D.   On: 06/02/2016 05:47   Dg Chest Port 1 View  Result Date: 05/23/2016 CLINICAL DATA:  Sepsis protocol. Recent diagnosis right lung cancer. Recurrent pleural effusions. EXAM: PORTABLE CHEST 1 VIEW COMPARISON:  05/23/2016 FINDINGS: Sternotomy wires and left-sided pacemaker unchanged. Persistent opacification over the right mid to lower lung likely combination effusion, atelectasis and known lung cancer. Fluid tracks to the right apex. Mild prominence of the perihilar markings suggesting a component of vascular congestion. Moderate stable cardiomegaly. Calcified plaque over the aortic arch. Degenerative change of the spine. IMPRESSION: Persistent opacification over the right mid to lower lung without significant change likely combination of effusion, atelectasis and known lung cancer. Moderate cardiomegaly with suggestion of mild vascular congestion. Aortic atherosclerosis. Electronically Signed   By: Marin Olp M.D.   On: 05/18/2016 19:30   Dg Abd  Portable 1v  Result Date: 06/02/2016 CLINICAL DATA:  Initial evaluation for NG tube placement. EXAM: PORTABLE ABDOMEN - 1 VIEW COMPARISON:  Prior radiograph from 05/16/2016. FINDINGS: NG tube in place, tip overlies the distal stomach/proximal duodenum. Side hole well beyond the GE junction. Tip projects inferiorly. Visualized bowel gas pattern nonobstructive. Small right pleural effusion with associated right basilar opacity, which may reflect atelectasis or infiltrate. IMPRESSION: 1. Enteric tube in place with tip overlying the distal stomach/proximal duodenum. Tip projects inferiorly. 2. Small right pleural effusion with associated right basilar opacity, which may reflect atelectasis or infiltrate. Electronically Signed   By: Jeannine Boga M.D.   On: 06/02/2016 06:08     STUDIES:  06/02/2016 CT head > NAICP  CULTURES: Blood culture 05/28/2016 Respiratory culture 06/02/2016  ANTIBIOTICS: Cefepime 05/30/2016  SIGNIFICANT EVENTS: Admitted 05/31/2016 Worsening respiratory status overnight, intubated for mechanical ventilatory support 06/02/2016 early a.m.  LINES/TUBES: 06/02/2016 endotracheal tube  DISCUSSION: 82 year old male with a past medical history significant for systolic  congestive heart failure, COPD, advanced lung cancer admitted on 06/02/2015 with acute on chronic hypercarbic and hypoxemic respiratory failure. Failed noninvasive mechanical ventilation overnight and required intubation. My review of the objective data and discussing with his wife clinical evidence supports he has recurrent healthcare associated pneumonia. The differential diagnosis certainly includes recurrent pleural effusion or pulmonary embolism. However, regardless the cause the patient's wife tells me this morning that she knows he would not want this level of care and overall his status has been declining since his diagnosis of cancer 10 days ago. She's been in contact with his family members in [place].  Recommend no escalation of care. She says that over the 20 years time that she has known him he has always been resistant to healthcare and he is always made quite clear that he did not want to live with the support of machines. Based on this I have changed his CODE STATUS to DO NOT RESUSCITATE.  I advised the patient's wife today that given his multiple comorbid illnesses even in the absence of his cancer his overall mortality from this condition is quite high. He would not survive without the aid of significant long-term healthcare to which she has always been resistant.  After lengthy discussion, she tells me that she feels that he would want to be removed from mechanical ventilatory support.  She is going to recheck out to her son as well as the patient's ex-wife and her family to see if they want to come visit. After that we will remove the ventilator.   Roselie Awkward, MD Post PCCM Pager: [phone] Cell: [phone] After 3pm or if no response, call [phone]   06/02/2016, 6:53 AM

## 2016-06-02 NOTE — Addendum Note (Signed)
Addendum  created 06/02/16 0508 by West Pugh, CRNA   Charge Capture section accepted, Diagnosis association updated, Visit diagnoses modified

## 2016-06-02 NOTE — Progress Notes (Signed)
On arrival to unit, pt difficult to arouse. Placed on BiPAP and respiratory therapist had concern for [place] respirations. Opyd was notified and head CT scan was ordered.

## 2016-06-02 NOTE — Progress Notes (Signed)
Pt BP dropping 80s/40s with MAP in the 50s. Provider notified and 1000cc bolus of NS given

## 2016-06-02 NOTE — Patient Outreach (Signed)
[facility]) Care Management [place] Telephone Outreach, Care Coordination  06/02/2016  [name] 29-May-1934 [id number]  Care Communication outreach re: [name] [name], 82 y/o male referred to [facility] CM for transition of care after recent hospitalization February 6-16, 2018 for CAP/ hypoxic respiratory failure with (R) pleural effusion. Patient was noted to have recent weight loss of 30-40 pounds over last 3 months and (R) lower lung mass was identified during recent hospitalization. Patient has history including, but not limited to CAD with NSTEMI, HTN, sCHF with EF 30-35%, PAD, COPD/ OSA, cardiomyopathy, DM, and GERD. Patient was discharged home with home health [facility]) services through [facility] after refusing SNF placement.  Noted through review of EMR that patient was re-admitted to [facility] 2-W 05/28/2016 for acute respiratory failure requiring intubation/ mechanical ventilation, swelling of (L) UE, and acute encephalopathy.  Secure communication via EMR sent to [facility] notifying of patient's hospital re-admission.  Plan:  [facility] RN CM will follow patient's progress while hospitalized and collaborate with [facility] liaison's for discharge disposition.  Oneta Rack, RN, BSN, [employer] [facility] Care Management  [phone]

## 2016-06-02 NOTE — Progress Notes (Signed)
eLink Physician-Brief Progress Note Patient Name: Braelen Sproule DOB: [birth year] MRN: [id number]   Date of Service  06/02/2016  HPI/Events of Note  82 yo male with PMH of Lung CA and CHF. Admitted with fever, cough and lethargy. Progressively more lethargic. Repeat head CT Scan negative. Failed BiPAP. Now intubated and ventilated. PCCM asked to help manage mechanical ventilation. Lovenox Taliaferro and portable CXR already ordered.   eICU Interventions  Will order: 1. Ventilator Orders: 100%/PRVC 14/TV 530/P 5.  2. ABG at 5:45 AM. 3. Propofol IV infusion. Titrate to RASS = -1 to -2.  4. Protonix 40 mg IV now and Q day. 5. Place Foley catheter. 6. Place OGT to LIS.     Intervention Category Major Interventions: Respiratory failure - evaluation and management Evaluation Type: New Patient Evaluation  Sommer,Steven Eugene 06/02/2016, 4:42 AM

## 2016-06-02 NOTE — Progress Notes (Signed)
   06/02/16 1200  Clinical Encounter Type  Visited With Family  Visit Type Follow-up;Psychological support;Spiritual support;Critical Care  Referral From Chaplain  Consult/Referral To Chaplain  Spiritual Encounters  Spiritual Needs Emotional;Ritual;Other (Comment) (Pastoral Conversation/Support)  Stress Factors  Patient Stress Factors Not reviewed  Family Stress Factors Major life changes;Loss   I visited with the patient's family per referral by the night Chaplain who explained that the patient's wife requested a priest for last rites.  The wife was not in the room at the time of my visit, but 4 friends were at the bedside.  I explained that I would follow-up, as they stated that the patient's wife would need a visit, but they were ok at this time.   Please, contact Spiritual Care for further assistance.   [place] M.Div.

## 2016-06-02 NOTE — Progress Notes (Signed)
Transported on BiPAP to radiology and back to ICU without incident. VSS.

## 2016-06-02 NOTE — Progress Notes (Signed)
Nutrition Brief Note  Patient identified for new vent.  Wt Readings from Last 15 Encounters:  05/21/2016 239 lb (108.4 kg)  05/13/16 239 lb 11.2 oz (108.7 kg)  04/14/16 239 lb 6.4 oz (108.6 kg)  04/08/16 238 lb 8 oz (108.2 kg)  03/25/16 231 lb 1.9 oz (104.8 kg)  03/17/16 233 lb 12 oz (106 kg)  01/24/16 235 lb 6.4 oz (106.8 kg)  01/04/16 241 lb 6.4 oz (109.5 kg)  12/28/15 240 lb 8 oz (109.1 kg)  10/24/15 246 lb (111.6 kg)  10/05/15 241 lb 1.9 oz (109.4 kg)  08/02/15 235 lb 1.9 oz (106.6 kg)  07/25/15 241 lb 3.2 oz (109.4 kg)  06/25/15 239 lb 8 oz (108.6 kg)  06/11/15 247 lb (112 kg)    Body mass index is 37.43 kg/m. Patient meets criteria for obesity based on current BMI.   Current diet order is NPO as pt is currently requiring ventilator support. He was diagnosed with stage 3 small cell carcinoma of the right lung 10 days PTA. Notes indicate pt not currently on treatment and is not a candidate for chemo but was under consideration for immunotherapy and/or palliative radiation. An RN note yesterday indicated that family stated pt had not eaten for 3 days PTA.   Per Dr. Anastasia Pall note this AM, plan for one-way extubation once family has been given the opportunity to visit pt, if desired. No escalation of care.   Labs reviewed; CBG: 145 mg./dL this AM, no BMET Medications reviewed; sliding scale Novolog, 10 units Lantus/day, 40 mg IV Protonix/day.  IVF: NS @ 75 mL/hr.   RD will review chart tomorrow to monitor for further POC.  If nutrition issues arise, please consult RD.     Jarome Matin, MS, RD, LDN, [facility] Inpatient Clinical Dietitian Pager # [phone] After hours/weekend pager # [phone]

## 2016-06-02 NOTE — Progress Notes (Signed)
Date:  June 02, 2016 Chart reviewed for concurrent status and case management needs. Will continue to follow patient progress. Discharge Planning: following for needs Expected discharge date: [id number] Velva Harman, BSN, [place], [place]

## 2016-06-02 NOTE — Progress Notes (Signed)
Pt was found to be lethargic on admission to floor. IV team came to get labs, RN did in and out cath, and respiratory came to draw blood gas level and pt still sound asleep. PCO2 was found to be elevated at 66.5, admitting MD was notified and decided for pt to go to step-down to receive BiPa. Patient now in room 1226.  Carmela Hurt, RN 06/02/16 2:07 AM

## 2016-06-03 ENCOUNTER — Encounter (HOSPITAL_COMMUNITY): Payer: Self-pay

## 2016-06-03 ENCOUNTER — Other Ambulatory Visit (HOSPITAL_COMMUNITY): Payer: PPO

## 2016-06-03 ENCOUNTER — Other Ambulatory Visit: Payer: Self-pay | Admitting: *Deleted

## 2016-06-03 ENCOUNTER — Encounter (HOSPITAL_COMMUNITY): Payer: PPO

## 2016-06-03 ENCOUNTER — Encounter: Payer: Self-pay | Admitting: *Deleted

## 2016-06-03 LAB — GLUCOSE, CAPILLARY
GLUCOSE-CAPILLARY: 123 mg/dL — AB (ref 65–99)
GLUCOSE-CAPILLARY: 135 mg/dL — AB (ref 65–99)

## 2016-06-03 LAB — HIV ANTIBODY (ROUTINE TESTING W REFLEX): HIV SCREEN 4TH GENERATION: NONREACTIVE

## 2016-06-04 ENCOUNTER — Ambulatory Visit: Payer: PPO | Admitting: Radiation Oncology

## 2016-06-04 ENCOUNTER — Ambulatory Visit: Payer: PPO

## 2016-06-04 ENCOUNTER — Ambulatory Visit: Payer: Self-pay | Admitting: *Deleted

## 2016-06-04 ENCOUNTER — Telehealth: Payer: Self-pay | Admitting: Family Medicine

## 2016-06-04 NOTE — Telephone Encounter (Signed)
Patient recently deceased. I called his wife to offer my condolences. She thanked me for the call.

## 2016-06-05 NOTE — Progress Notes (Signed)
Pts. BP has been gradually trending down. BP is 62/30(40). Pt.is in  NSR. He is unresponsive. He is on a Morphine drip going at 10 ml/hr. MD has been made aware. Orders have been put in for Morphine drip to run at 59m/hr. RN titrated drip to 872mhr and will continue monitoring BP and titrating drip per MD order.

## 2016-06-05 NOTE — Patient Outreach (Signed)
[name] [name]) Care Management  08-Jun-2016  [name] [name] 03/20/1935 [id number]  Care Coordination communication outreach re: [name] [name], 82 y/o male referred to [facility] CM for transition of care after recent hospitalization February 6-16, 2018 for CAP/ hypoxic respiratory failure with (R) pleural effusion. Patient was re-admitted to [facility] 2-W 05/29/2016 for acute respiratory failure requiring intubation/ mechanical ventilation, swelling of (L) UE, and acute encephalopathy.  Received voicemail message from Norton Blizzard, Oncology navigator at 12:45 pm that patient had terminal mechanical ventilation wean planned today; Secure communication via EMR was received from [place] at 2:45 pm notifying of patient's passing.  Plan:  Will close [facility] RN CM case.  Oneta Rack, RN, BSN, [employer] [facility] Care Management  [phone]

## 2016-06-05 NOTE — Progress Notes (Signed)
   06/18/2016 0900  Clinical Encounter Type  Visited With Family  Visit Type Follow-up;Psychological support;Spiritual support;Critical Care  Referral From Nurse  Consult/Referral To Chaplain  Spiritual Encounters  Spiritual Needs Emotional;Prayer;Ritual;Other (Comment) (Pastoral Conversation/Support)  Stress Factors  Patient Stress Factors Not reviewed  Family Stress Factors Major life changes;Loss   I followed up with Mr. Glazier family. A request was made by the patient's wife for a priest to come and give the patient Last Rites. I contacted [place] and they said that they would send someone. The patient's wife and family friends were at the bedside. They stated that their main concern is that the patient is comfortable as he passes.  The patient's wife requested that I pray with them and pray for the patient's comfort and peace for them.  I shared sacred text and we prayed at the bedside.   Please, contact Spiritual Care for further assistance.   [place] M.Div.

## 2016-06-05 NOTE — Procedures (Signed)
Extubation Procedure Note  Patient Details:   Name: Oran Dillenburg DOB: 10-07-1934 MRN: [id number]   Airway Documentation:     Evaluation  O2 sats: currently acceptable Complications: No apparent complications Patient did tolerate procedure well. Bilateral Breath Sounds: Rhonchi   No   Patient extubated for comfort care. Patient vitals currently acceptable. RT available as needed.   Lamonte Sakai Jun 20, 2016, 10:32 AM

## 2016-06-05 NOTE — Consult Note (Signed)
   [facility] CM Inpatient Consult   06-12-2016  [name] [name] [birth year] [id number]   Made aware of hospitalization by [place]. [name] recently signed up with [facility] Management. Chart reviewed. Noted [name] is transitioning to comfort care. Will make [place] aware.    Marthenia Rolling, MSN-Ed, RN,BSN [facility] Liaison [phone]

## 2016-06-05 NOTE — Progress Notes (Signed)
[facility] PCCM  Patient seen and examined  S: Hypotension overnight Started on morphine drip yesterday  O: Vitals:   2016/06/17 0600 17-Jun-2016 0800 June 17, 2016 0834 Jun 17, 2016 0900  BP: (!) 71/36 (!) 71/33  (!) 68/35  Pulse:      Resp: '14 14  14  '$ Temp:      TempSrc:      SpO2: 95% 95% 95% 95%  Weight:      Height:       Vent Mode: PRVC FiO2 (%):  [30 %-40 %] 30 % Set Rate:  [14 bmp] 14 bmp Vt Set:  [530 mL] 530 mL PEEP:  [5 cmH20] 5 cmH20 Plateau Pressure:  [19 cmH20-21 cmH20] 19 cmH20  Gen: sedated on vent HENT: OP clear, ETT in place PULM: diminished RLL, clear left vent supported breathing CV: RRR, no mgr, trace edema GI: BS+, soft, nontender Derm: no cyanosis or rash Neuro: sedated, comfortable  No new labs today   No new chest imaging  Impression: Healthcare associated pneumonia Lung cancer COPD Systolic Congestive Heart Failure  Plan: Comfort measures One way extubation 2/27 after pastor visits at [place], MD [place] PCCM Pager: [phone] Cell: [phone] After 3pm or if no response, call [phone]

## 2016-06-05 NOTE — Progress Notes (Signed)
Nutrition Brief Note  Chart reviewed. Brief note written by this RD yesterday.  Pt started on Morphine drip yesterday. Reviewed Dr. Anastasia Pall note from this AM. Pt now transitioning to comfort care with plan for one-way extubation after pastor visits at [facility] today.  No further nutrition interventions warranted at this time.  Please re-consult as needed.     Jarome Matin, MS, RD, LDN, [facility] Inpatient Clinical Dietitian Pager # [phone] After hours/weekend pager # [phone]

## 2016-06-05 DEATH — deceased

## 2016-06-07 LAB — CULTURE, BLOOD (ROUTINE X 2)
CULTURE: NO GROWTH
CULTURE: NO GROWTH

## 2016-06-09 ENCOUNTER — Telehealth: Payer: Self-pay

## 2016-06-09 NOTE — Telephone Encounter (Signed)
On 06/09/16 I received a death certificate from [employer] (original). The death certificate is for emtombment. The patient is a patient of Doctor McQuaid. The death certificate will be taken to Pulmonary Unit @ [facility] this pm for signature.  On 2016-07-08 I received the death certificate back from Doctor McQuaid. I got the death certificate ready and called the funeral home to let them know the d/c was mailed to vital records per their request.

## 2016-06-24 LAB — FUNGUS CULTURE WITH STAIN

## 2016-06-24 LAB — FUNGAL ORGANISM REFLEX

## 2016-06-24 LAB — FUNGUS CULTURE RESULT

## 2016-07-01 ENCOUNTER — Ambulatory Visit: Payer: PPO | Admitting: Internal Medicine

## 2016-07-01 ENCOUNTER — Other Ambulatory Visit: Payer: PPO

## 2016-07-06 NOTE — Discharge Summary (Signed)
[facility] death note  Date of admission 12-Jun-2016 date of death Jun 14, 2016  Cause of death: Healthcare associated pneumonia Lung cancer COPD Systolic Congestive Heart Failure  Brief history of present illness and hospital course: This is a 82 year old male with a past medical history significant for systolic congestive heart failure, chronic kidney disease, diabetes and newly diagnosed stage III lung cancer he was admitted to our facility for acute respiratory failure with hypoxemia. He had just been diagnosed with lung cancer proximally 10 days prior. He presented with shortness of breath and cough. He was found to have healthcare associated pneumonia. He required intubation for management of his respiratory failure. We discussed CODE STATUS with the patient's wife his stated clearly that he never wanted any degree of mechanical ventilation and was always trying to avoid the healthcare system whenever possible. We discussed the fact that his prognosis overall is poor considering his multiple comorbid illnesses and she stated that he would want to just have comfort measures only. So after all family had an opportunity is been time with him we withdrew care with her the bedside. He died peacefully.  Roselie Awkward, MD [place] [facility] Pager: [phone] After 3pm or if no response, call [phone]

## 2016-07-07 LAB — ACID FAST CULTURE WITH REFLEXED SENSITIVITIES: ACID FAST CULTURE - AFSCU3: NEGATIVE

## 2016-07-07 LAB — ACID FAST CULTURE WITH REFLEXED SENSITIVITIES (MYCOBACTERIA)

## 2018-02-10 IMAGING — DX DG CHEST 1V PORT
1 series · 1 of 1 positions shown · non-contrast
Comparison: 05/14/2016

CLINICAL DATA: Right-sided pleural effusion

EXAM:
PORTABLE CHEST 1 VIEW

[chest ap]
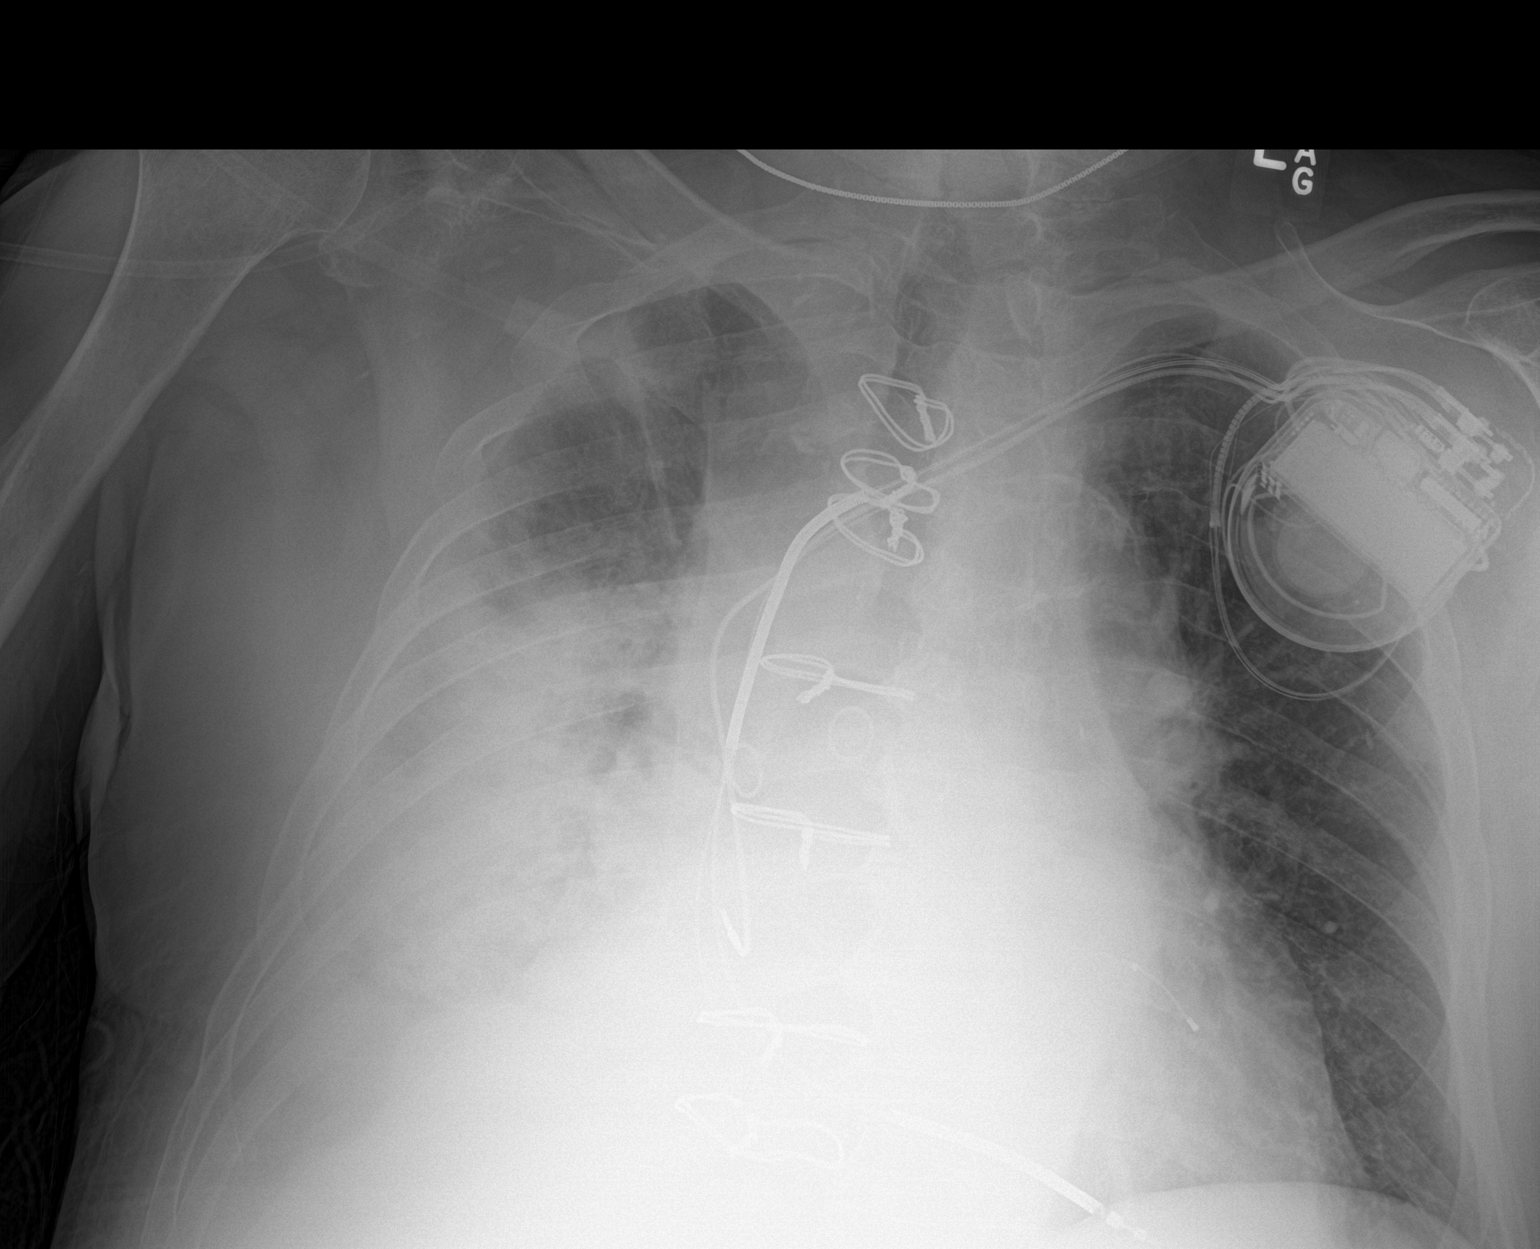

[1 of 1 positions shown; findings below may reference images not displayed]

FINDINGS: Cardiac shadow is enlarged. A defibrillator is again noted and
stable. Postsurgical changes are again seen. Left lung remains
clear. The tiny apical pneumothorax is not well appreciated on this
exam although some apical capping is noted and this may represent
redistribution of residual fluid. New right perihilar and basilar
infiltrate is noted. This may represent some re-expansion edema or
progressive infiltrate.
IMPRESSION: Increased infiltrative density in the right lung. This may represent
some re-expansion edema. Small residual effusion is noted.

## 2018-02-11 IMAGING — DX DG CHEST 1V
1 series · 1 of 1 positions shown · non-contrast
Comparison: Study obtained earlier in the day.

CLINICAL DATA: Status post right-sided thoracentesis.

EXAM:
CHEST 1 VIEW

[chest ap]
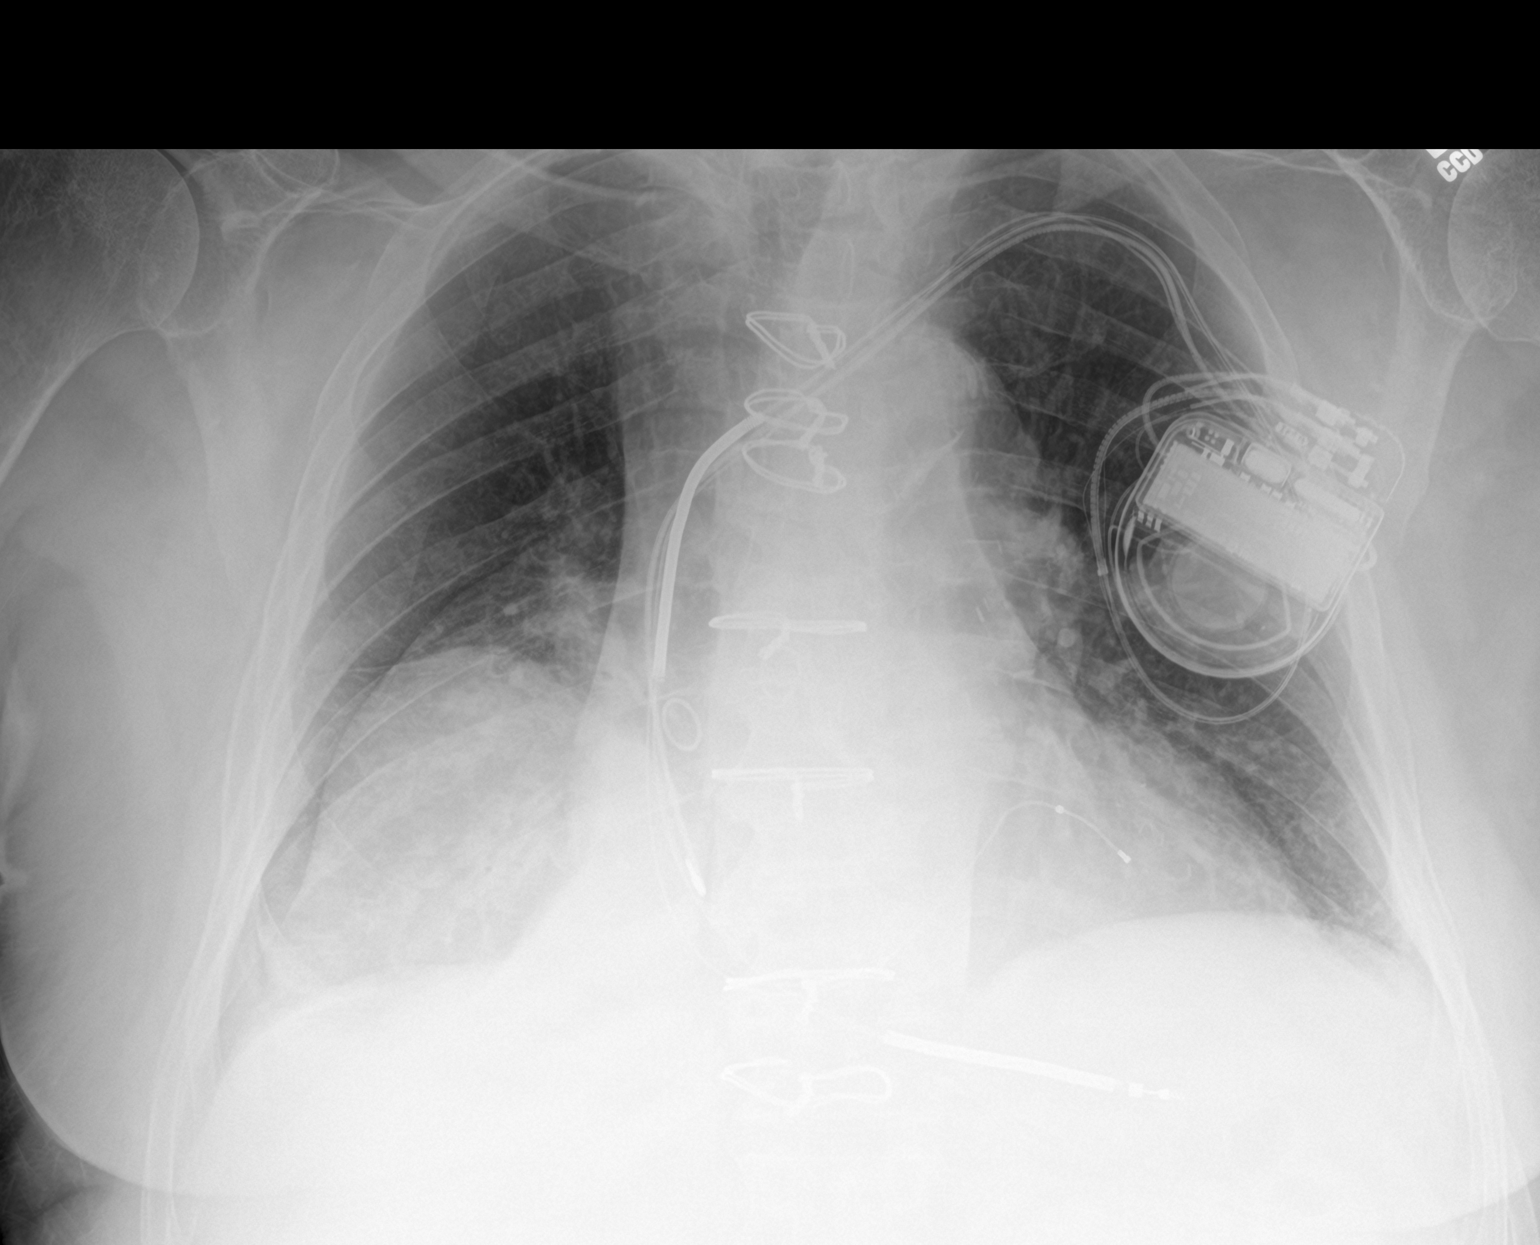

[1 of 1 positions shown; findings below may reference images not displayed]

FINDINGS: Most of the right pleural effusion noted previously has been
removed. No pneumothorax. A small amount of pleural effusion remains
on the right. There is consolidation in the right base medially.
Left lung is clear. There is apparent eventration of the right
hemidiaphragm. Heart is mildly enlarged with pulmonary vascularity
within normal limits. Pacemaker leads are attached to the right
atrium, right ventricle, and coronary sinus. No adenopathy. There is
atherosclerotic calcification in the aorta. No bone lesions.
IMPRESSION: No pneumothorax post thoracentesis. Small amount of residual right
pleural effusion as well as right base consolidation. Eventration
right hemidiaphragm. Left lung clear. Stable cardiac prominence.
There is aortic atherosclerosis

## 2018-02-11 IMAGING — DX DG CHEST 1V PORT
1 series · 1 of 1 positions shown · non-contrast
Comparison: 05/15/2016

CLINICAL DATA: Followup right pleural effusion. Chronic systolic
congestive heart failure. Chronic kidney disease.

EXAM:
PORTABLE CHEST 1 VIEW

[chest ap]
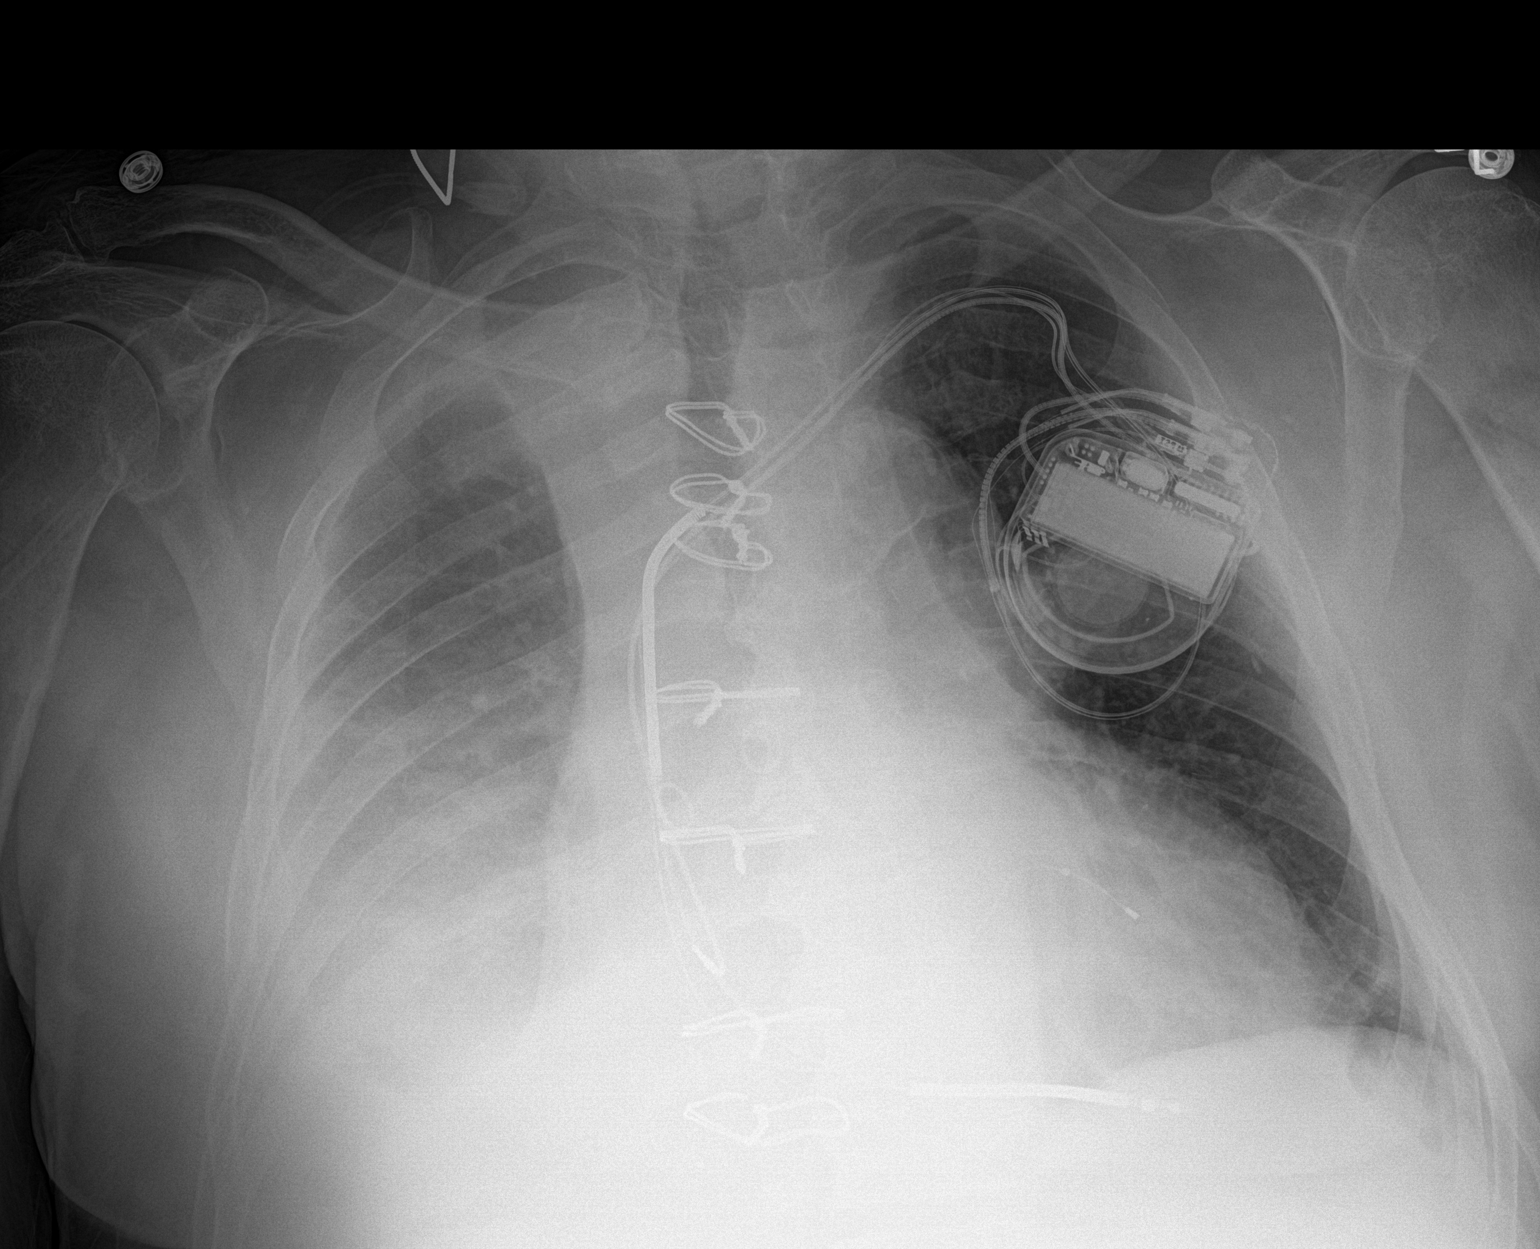

[1 of 1 positions shown; findings below may reference images not displayed]

FINDINGS: Stable cardiomegaly. AICD remains in appropriate position. Prior
CABG.

Mild improvement in right mid and lower lung airspace opacity since
prior study. Small to moderate right pleural effusion again noted.
Left lung remains clear.
IMPRESSION: Mild improvement in right mid and lower lung airspace opacity. Small
to moderate right pleural effusion.

Stable cardiomegaly.

## 2018-02-12 IMAGING — DX DG CHEST 1V PORT
1 series · 1 of 1 positions shown · non-contrast
Comparison: 05/16/2016

CLINICAL DATA: Hypoxia and right sided pleural effusion. H/o CAD,
ischemic cardiomyopathy, myocardial infarction, COPD, type 2
diabetes. Surgical h/o CABG in 1119 and cardiac defibrillator
placement in 3493. Former smoker.

EXAM:
PORTABLE CHEST - 1 VIEW

[chest ap]
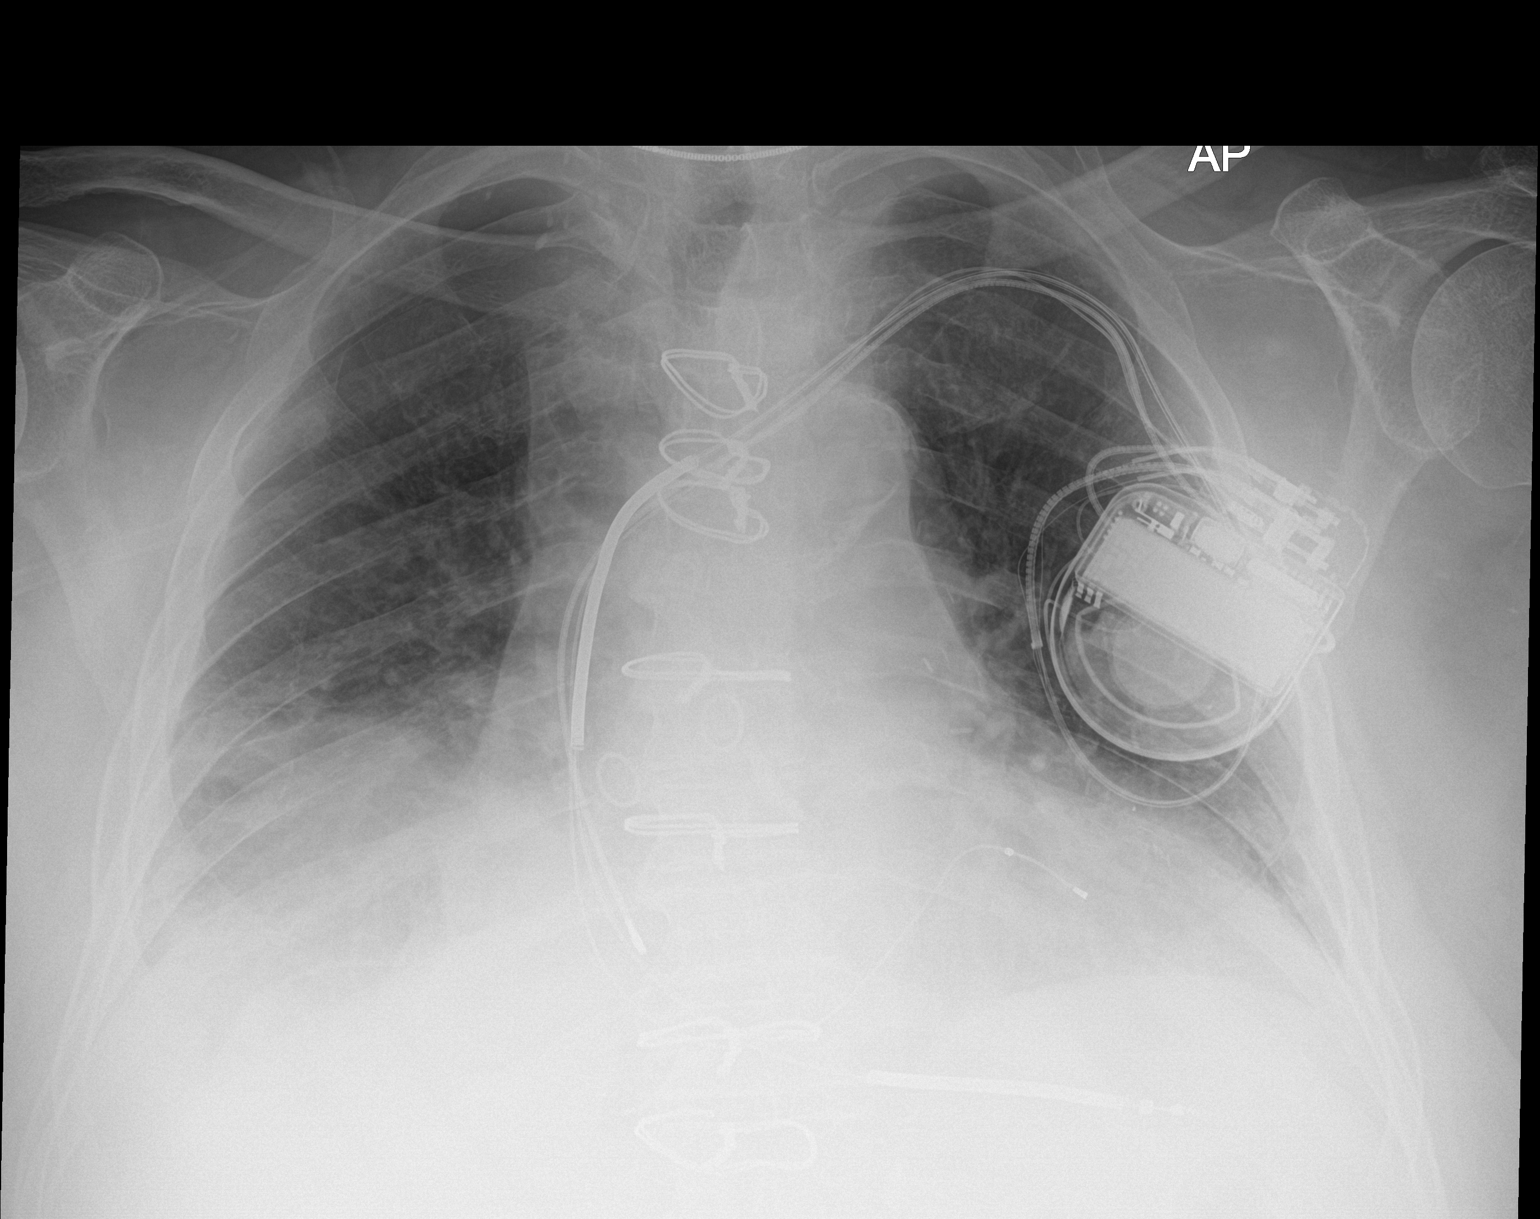

[1 of 1 positions shown; findings below may reference images not displayed]

FINDINGS: Stable left subclavian AICD. Previous median sternotomy and CABG.
Low lung volumes with crowding of bibasilar bronchovascular
structures.

Heart size upper limits normal for technique.  Atheromatous aorta.

Blunting of bilateral lateral costophrenic angles right worse than
left ; can't exclude small pleural effusions.
IMPRESSION: 1. Low volumes with bibasilar crowding, possible small effusions.

## 2018-02-14 IMAGING — DX DG CHEST 1V PORT
1 series · 1 of 1 positions shown · non-contrast
Comparison: CT chest 05/18/2016

CLINICAL DATA: Follow-up right pneumothorax.

EXAM:
PORTABLE CHEST 1 VIEW

[chest ap]
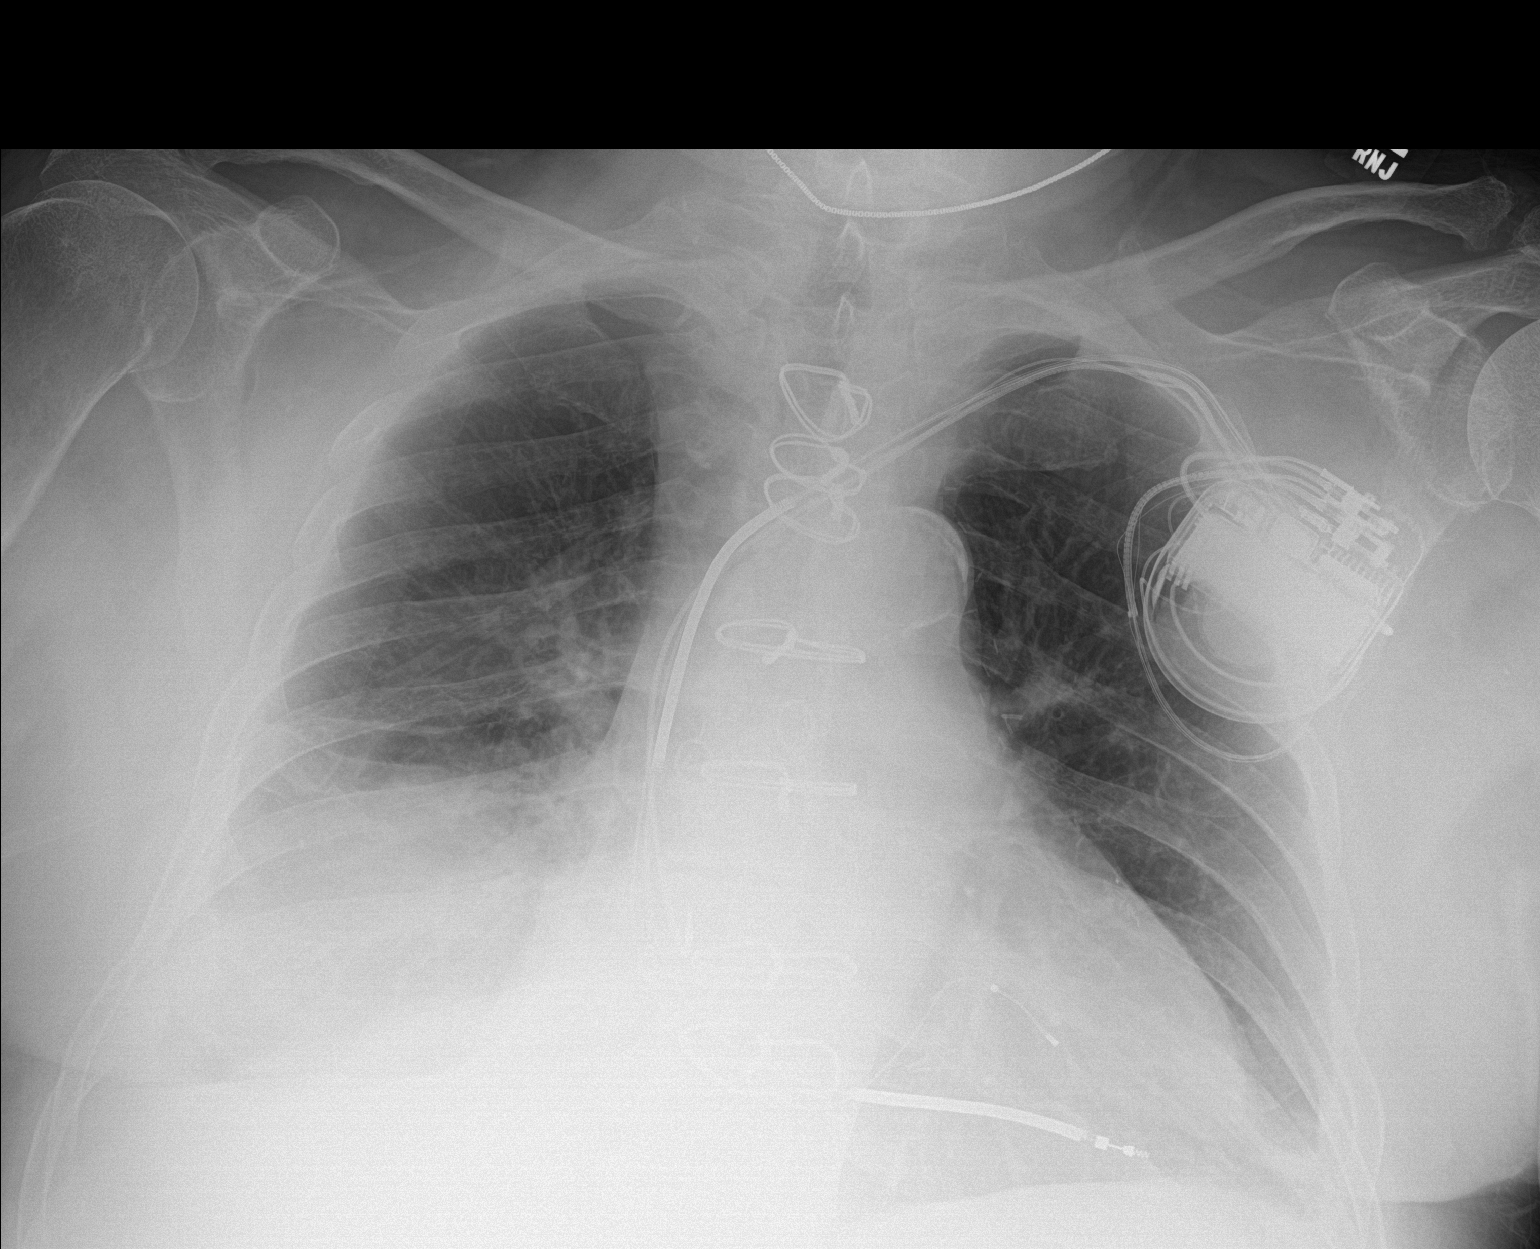

[1 of 1 positions shown; findings below may reference images not displayed]

FINDINGS: Persistent right lower lobe airspace opacity. Small right pleural
effusion. Mild left basilar atelectasis. No pneumothorax. Stable
cardiomegaly. Prior CABG. Three lead cardiac pacemaker.
IMPRESSION: No right pneumothorax.

Persistent right lower lobe airspace opacity concerning for a mass.
